# Patient Record
Sex: Female | Born: 1965 | Race: White | Hispanic: No | Smoking: Current every day smoker
Health system: Southern US, Community
[De-identification: ages and names within clinical notes are randomized; demographics above are authoritative.]

## PROBLEM LIST (undated history)

## (undated) DIAGNOSIS — Z1379 Encounter for other screening for genetic and chromosomal anomalies: Secondary | ICD-10-CM

## (undated) DIAGNOSIS — I10 Essential (primary) hypertension: Secondary | ICD-10-CM

## (undated) DIAGNOSIS — F32A Depression, unspecified: Secondary | ICD-10-CM

## (undated) DIAGNOSIS — I639 Cerebral infarction, unspecified: Secondary | ICD-10-CM

## (undated) DIAGNOSIS — C50919 Malignant neoplasm of unspecified site of unspecified female breast: Secondary | ICD-10-CM

## (undated) DIAGNOSIS — F329 Major depressive disorder, single episode, unspecified: Secondary | ICD-10-CM

## (undated) DIAGNOSIS — F419 Anxiety disorder, unspecified: Secondary | ICD-10-CM

## (undated) DIAGNOSIS — K219 Gastro-esophageal reflux disease without esophagitis: Secondary | ICD-10-CM

## (undated) HISTORY — DX: Depression, unspecified: F32.A

## (undated) HISTORY — DX: Essential (primary) hypertension: I10

## (undated) HISTORY — DX: Encounter for other screening for genetic and chromosomal anomalies: Z13.79

## (undated) HISTORY — DX: Gastro-esophageal reflux disease without esophagitis: K21.9

## (undated) HISTORY — DX: Malignant neoplasm of unspecified site of unspecified female breast: C50.919

## (undated) HISTORY — DX: Anxiety disorder, unspecified: F41.9

## (undated) HISTORY — DX: Cerebral infarction, unspecified: I63.9

## (undated) HISTORY — PX: TONSILLECTOMY AND ADENOIDECTOMY: SHX28

## (undated) HISTORY — DX: Major depressive disorder, single episode, unspecified: F32.9

---

## 1998-08-13 HISTORY — PX: ABDOMINAL HYSTERECTOMY: SHX81

## 2004-07-17 ENCOUNTER — Emergency Department: Payer: Self-pay | Admitting: Emergency Medicine

## 2004-07-17 ENCOUNTER — Other Ambulatory Visit: Payer: Self-pay

## 2004-07-18 ENCOUNTER — Ambulatory Visit: Payer: Self-pay | Admitting: Emergency Medicine

## 2004-12-02 ENCOUNTER — Emergency Department: Payer: Self-pay | Admitting: Unknown Physician Specialty

## 2005-08-07 ENCOUNTER — Ambulatory Visit: Payer: Self-pay | Admitting: Family Medicine

## 2008-06-30 ENCOUNTER — Ambulatory Visit: Payer: Self-pay | Admitting: Gastroenterology

## 2008-06-30 HISTORY — PX: COLONOSCOPY: SHX174

## 2008-08-13 HISTORY — PX: LAPAROSCOPIC HYSTERECTOMY: SHX1926

## 2008-08-17 ENCOUNTER — Ambulatory Visit: Payer: Self-pay | Admitting: Obstetrics & Gynecology

## 2008-08-26 ENCOUNTER — Ambulatory Visit: Payer: Self-pay | Admitting: Obstetrics & Gynecology

## 2009-02-17 ENCOUNTER — Inpatient Hospital Stay: Payer: Self-pay | Admitting: Specialist

## 2009-02-22 ENCOUNTER — Ambulatory Visit: Payer: Self-pay | Admitting: Unknown Physician Specialty

## 2009-03-13 ENCOUNTER — Ambulatory Visit: Payer: Self-pay | Admitting: Unknown Physician Specialty

## 2009-04-13 ENCOUNTER — Ambulatory Visit: Payer: Self-pay | Admitting: Unknown Physician Specialty

## 2009-04-30 ENCOUNTER — Emergency Department: Payer: Self-pay | Admitting: Emergency Medicine

## 2009-05-13 ENCOUNTER — Ambulatory Visit: Payer: Self-pay | Admitting: Unknown Physician Specialty

## 2011-09-07 ENCOUNTER — Ambulatory Visit: Payer: Self-pay

## 2011-09-20 ENCOUNTER — Ambulatory Visit: Payer: Self-pay | Admitting: Family Medicine

## 2012-10-31 ENCOUNTER — Ambulatory Visit: Payer: Self-pay | Admitting: Family Medicine

## 2012-11-19 ENCOUNTER — Ambulatory Visit: Payer: Self-pay | Admitting: Family Medicine

## 2014-01-11 DIAGNOSIS — C50919 Malignant neoplasm of unspecified site of unspecified female breast: Secondary | ICD-10-CM

## 2014-01-11 HISTORY — DX: Malignant neoplasm of unspecified site of unspecified female breast: C50.919

## 2014-01-22 ENCOUNTER — Ambulatory Visit: Payer: Self-pay | Admitting: Family Medicine

## 2014-01-29 ENCOUNTER — Ambulatory Visit: Payer: Self-pay | Admitting: Family Medicine

## 2014-02-09 ENCOUNTER — Ambulatory Visit: Payer: Self-pay | Admitting: General Surgery

## 2014-02-17 ENCOUNTER — Encounter: Payer: Self-pay | Admitting: General Surgery

## 2014-02-17 ENCOUNTER — Ambulatory Visit (INDEPENDENT_AMBULATORY_CARE_PROVIDER_SITE_OTHER): Payer: BC Managed Care – PPO | Admitting: General Surgery

## 2014-02-17 ENCOUNTER — Telehealth: Payer: Self-pay | Admitting: *Deleted

## 2014-02-17 ENCOUNTER — Ambulatory Visit: Payer: Self-pay | Admitting: General Surgery

## 2014-02-17 VITALS — BP 148/86 | HR 88 | Resp 12 | Ht 62.0 in | Wt 146.0 lb

## 2014-02-17 DIAGNOSIS — C50912 Malignant neoplasm of unspecified site of left female breast: Secondary | ICD-10-CM

## 2014-02-17 DIAGNOSIS — C50919 Malignant neoplasm of unspecified site of unspecified female breast: Secondary | ICD-10-CM

## 2014-02-17 NOTE — Progress Notes (Signed)
Patient ID: Tina Sharp, female   DOB: [birth year], 48 y.o.   MRN: [id number]  Chief Complaint  Patient presents with  . Breast Problem    invasive carcinoma    HPI Tina Sharp is a 48 y.o. female.  who presents for a breast evaluation. She states she felt a lump in the left breast the first part of June of this year. There's been no change since initial discovery. It was incidentally identified. There is no history of trauma. She had missed her April 2015 mammogram. The most recent mammogram was done on 01-22-14. A follow up mammogram was completed with biopsy showing carcinoma on 01-29-14.  Patient does not consistently perform regular self breast checks and gets regular mammograms done.    The patient is accompanied today by a close friend, Tina Sharp was present for the interview and exam.  HPI  Past Medical History  Diagnosis Date  . GERD (gastroesophageal reflux disease)   . Cancer June 2015    Invasive lobular carcinoma    Past Surgical History  Procedure Laterality Date  . Laparoscopic hysterectomy  2010    [facility]    Family History  Problem Relation Age of Onset  . Cancer Mother 79    breast  . Cancer Maternal Grandmother     breast /? age 43  . Cancer Maternal Grandfather 82    prostate    Social History History  Substance Use Topics  . Smoking status: Former Smoker    Quit date: 08/15/2013  . Smokeless tobacco: Not on file     Comment: E cig  . Alcohol Use: Yes     Comment: social    No Known Allergies  Current Outpatient Prescriptions  Medication Sig Dispense Refill  . esomeprazole (NEXIUM) 20 MG capsule Take 20 mg by mouth daily at 12 noon.      Marland Kitchen LORazepam (ATIVAN) 1 MG tablet Take 1 mg by mouth every 8 (eight) hours as needed for anxiety.       No current facility-administered medications for this visit.    Review of Systems Review of Systems  Constitutional: Negative.   Respiratory: Negative.   Cardiovascular: Negative.      Blood pressure 148/86, pulse 88, resp. rate 12, height '5\' 2"'  (1.575 m), weight 146 lb (66.225 kg).  Physical Exam Physical Exam  Constitutional: She is oriented to person, place, and time. She appears well-developed and well-nourished.  Neck: Neck supple.  Cardiovascular: Normal rate, regular rhythm and normal heart sounds.   Pulmonary/Chest: Effort normal and breath sounds normal.  Left breast small bruising from biopsy. A 1.5 cm nodule at 3 o'clock lateral aspect left breast  Lymphadenopathy:    She has no cervical adenopathy.  Neurological: She is alert and oriented to person, place, and time.  Skin: Skin is warm and dry.    Data Reviewed Diagnostic mammogram dated 01/22/2014 were unremarkable on the right. The left breast showed 2 cm area of architectural distortion correlating with the palpable mass 14 cm from the nipple. Ultrasound showed a irregular area measuring up to 1.8 cm in diameter. Normal axillary nodes.  Ultrasound guided core biopsy completed 01/29/2014 by the radiology service showed evidence of invasive lobular carcinoma with angiolymphatic invasion measuring up to 1.1 cm. Receptor status is pending.  PCP notes of 01/15/2014 were reviewed.    Assessment    Invasive lobular carcinoma of the left breast.    Plan   We spent about an hour reviewing options  for management. Breast conservation and mastectomy were presented as equivalent procedures. The pros and cons of each were discussed. Travel would be an issue for the patient regarding radiation as she lives about 45 minutes from either [facility] or [place].  The possibility of mastectomy with immediate reconstruction was discussed.  The potential for multifocal or contralateral tumor with the invasive lobular histology was reviewed, and indication for preoperative breast MRI discussed. She was advised is not uncommon to have additional non-ligament lesions identified the require biopsy.    This patient is  to have the following labs drawn at [place] today: CBC, Met C, CEA, and CA 27-29.   Arrangements will also be made for patient to have a chest x-ray PA and Lateral at [place] today as well.   Patient to have a bilateral breast MRI. This is to be arranged at [facility] due to the patient's location.   A referral will be made for the patient to see Dr. Nicholaus Bloom at [facility].   PCP: Dr Heidi Dach, [place] Gleason 02/20/2014, 9:10 AM

## 2014-02-17 NOTE — Telephone Encounter (Signed)
Patient has been scheduled for a bilateral breast MRI at [facility] for Wednesday, 02-24-14 at 4 pm. The patient was notified of date, time, and instructions per [facility] radiology department.   Please note: the patient's [facility] MRN is [id number].  This patient was instructed that she would need to sign a release the day she has MRI completed to obtain a copy of the MRI disc . Patient will bring disc by our office. She was also provided with the fax number so [facility] can fax a copy of the report for our review.

## 2014-02-17 NOTE — Patient Instructions (Addendum)
Continue self breast exams. Call office for any new breast issues or concerns.  This patient will need to have lab work and chest x-ray today.   Patient to have a bilateral breast MRI at [facility].  Also, a referral with be made to plastic surgery, Dr. Nicholaus Bloom.

## 2014-02-17 NOTE — Progress Notes (Deleted)
Patient ID: Tina Sharp, female   DOB: [birth year], 48 y.o.   MRN: [id number]  Chief Complaint  Patient presents with  . Breast Problem    invasive carcinoma    HPI Tina Sharp is a 48 y.o. female HPI  No past medical history on file.  No past surgical history on file.  No family history on file.  Social History History  Substance Use Topics  . Smoking status: Not on file  . Smokeless tobacco: Not on file  . Alcohol Use: Not on file    Allergies not on file  No current outpatient prescriptions on file.   No current facility-administered medications for this visit.    Review of Systems Review of Systems  Constitutional: Negative.   Respiratory: Negative.   Cardiovascular: Negative.     There were no vitals taken for this visit.  Physical Exam Physical Exam  Data Reviewed ***  Assessment    ***    Plan    ***       Lesly Rubenstein 02/17/2014, 8:42 AM

## 2014-02-18 LAB — CBC WITH DIFFERENTIAL
BASOS: 1 %
Basophils Absolute: 0.1 10*3/uL (ref 0.0–0.2)
EOS ABS: 0.2 10*3/uL (ref 0.0–0.4)
Eos: 4 %
HEMATOCRIT: 42.2 % (ref 34.0–46.6)
Hemoglobin: 14.4 g/dL (ref 11.1–15.9)
IMMATURE GRANS (ABS): 0 10*3/uL (ref 0.0–0.1)
Immature Granulocytes: 0 %
LYMPHS ABS: 1.7 10*3/uL (ref 0.7–3.1)
Lymphs: 36 %
MCH: 34.1 pg — AB (ref 26.6–33.0)
MCHC: 34.1 g/dL (ref 31.5–35.7)
MCV: 100 fL — ABNORMAL HIGH (ref 79–97)
Monocytes Absolute: 0.5 10*3/uL (ref 0.1–0.9)
Monocytes: 10 %
Neutrophils Absolute: 2.3 10*3/uL (ref 1.4–7.0)
Neutrophils Relative %: 49 %
Platelets: 310 10*3/uL (ref 150–379)
RBC: 4.22 x10E6/uL (ref 3.77–5.28)
RDW: 14.3 % (ref 12.3–15.4)
WBC: 4.7 10*3/uL (ref 3.4–10.8)

## 2014-02-18 LAB — COMPREHENSIVE METABOLIC PANEL
ALT: 19 IU/L (ref 0–32)
AST: 29 IU/L (ref 0–40)
Albumin/Globulin Ratio: 2.2 (ref 1.1–2.5)
Albumin: 4.3 g/dL (ref 3.5–5.5)
Alkaline Phosphatase: 67 IU/L (ref 39–117)
BILIRUBIN TOTAL: 0.6 mg/dL (ref 0.0–1.2)
BUN/Creatinine Ratio: 8 — ABNORMAL LOW (ref 9–23)
BUN: 6 mg/dL (ref 6–24)
CALCIUM: 9.1 mg/dL (ref 8.7–10.2)
CO2: 24 mmol/L (ref 18–29)
CREATININE: 0.8 mg/dL (ref 0.57–1.00)
Chloride: 101 mmol/L (ref 97–108)
GFR, EST AFRICAN AMERICAN: 99 mL/min/{1.73_m2} (ref >59)
GFR, EST NON AFRICAN AMERICAN: 86 mL/min/{1.73_m2} (ref >59)
GLOBULIN, TOTAL: 2 g/dL (ref 1.5–4.5)
Glucose: 86 mg/dL (ref 65–99)
Potassium: 4.5 mmol/L (ref 3.5–5.2)
SODIUM: 144 mmol/L (ref 134–144)
Total Protein: 6.3 g/dL (ref 6.0–8.5)

## 2014-02-18 LAB — CEA: CEA: 6.7 ng/mL — ABNORMAL HIGH (ref 0.0–4.7)

## 2014-02-18 LAB — CANCER ANTIGEN 27.29: CA 27.29: 32.8 U/mL (ref 0.0–38.6)

## 2014-02-20 ENCOUNTER — Encounter: Payer: Self-pay | Admitting: General Surgery

## 2014-02-20 DIAGNOSIS — C50919 Malignant neoplasm of unspecified site of unspecified female breast: Secondary | ICD-10-CM | POA: Insufficient documentation

## 2014-02-22 ENCOUNTER — Telehealth: Payer: Self-pay | Admitting: *Deleted

## 2014-02-22 NOTE — Telephone Encounter (Signed)
Patient aware of date, time, and instructions of PET scan on 03-02-14.

## 2014-02-22 NOTE — Telephone Encounter (Signed)
Message copied by Dominga Ferry on Mon Feb 22, 2014  4:07 PM ------      Message from: [place], Maceo W      Created: Mon Feb 22, 2014 10:15 AM       See if we can get a PET/CT on this patient, staging working up for breast cancer with elevated tumor markers. Thanks.            ----- Message -----         From: [facility] Lab Results In Interface         Sent: 02/18/2014   5:42 AM           To: Robert Bellow, MD                   ------

## 2014-02-22 NOTE — Telephone Encounter (Signed)
Message for patient to call the office.  Patient has been scheduled for a PET scan at [facility] for Tuesday, 03-02-14 at 8 am (arrive 7:45 am). Will need to review prep instructions with patient.

## 2014-02-22 NOTE — Telephone Encounter (Signed)
Message left for patient to call the office.  We need to see when will be a convenient time for her to have PET scan.

## 2014-02-26 ENCOUNTER — Other Ambulatory Visit: Payer: Self-pay | Admitting: *Deleted

## 2014-02-26 ENCOUNTER — Telehealth: Payer: Self-pay | Admitting: *Deleted

## 2014-02-26 ENCOUNTER — Encounter: Payer: Self-pay | Admitting: General Surgery

## 2014-02-26 DIAGNOSIS — C50912 Malignant neoplasm of unspecified site of left female breast: Secondary | ICD-10-CM

## 2014-02-26 NOTE — Telephone Encounter (Signed)
Patient was contacted to see if she has schedule an appointment with Dr. Tula Nakayama yet per Dr. Bary Castilla. Patient was given Dr. Edwin Dada office # to contact them since no appointment as not be scheduled at this time. She was also notificed of office appointment here for March 03, 2014 @ 2 pm to review results of MRI and PET scan.

## 2014-03-02 ENCOUNTER — Ambulatory Visit: Payer: Self-pay | Admitting: General Surgery

## 2014-03-02 ENCOUNTER — Encounter: Payer: Self-pay | Admitting: General Surgery

## 2014-03-03 ENCOUNTER — Ambulatory Visit (INDEPENDENT_AMBULATORY_CARE_PROVIDER_SITE_OTHER): Payer: BC Managed Care – PPO | Admitting: General Surgery

## 2014-03-03 ENCOUNTER — Other Ambulatory Visit: Payer: BC Managed Care – PPO

## 2014-03-03 ENCOUNTER — Encounter: Payer: Self-pay | Admitting: General Surgery

## 2014-03-03 VITALS — BP 140/84 | HR 74 | Resp 12 | Ht 62.0 in | Wt 147.0 lb

## 2014-03-03 DIAGNOSIS — C50912 Malignant neoplasm of unspecified site of left female breast: Secondary | ICD-10-CM

## 2014-03-03 DIAGNOSIS — C50919 Malignant neoplasm of unspecified site of unspecified female breast: Secondary | ICD-10-CM

## 2014-03-03 NOTE — Progress Notes (Addendum)
Patient ID: Tina Sharp, female   DOB: Jan 15, 1966, 48 y.o.   MRN: [id number]  Chief Complaint  Patient presents with  . Follow-up    results Ct    HPI Tina Sharp is a 48 y.o. female here today following up from her Ct done at [facility] 03/02/14 and MRI at [facility] .  HPI  Past Medical History  Diagnosis Date  . GERD (gastroesophageal reflux disease)   . Cancer June 2015    Invasive lobular carcinoma    Past Surgical History  Procedure Laterality Date  . Laparoscopic hysterectomy  2010    [facility]    Family History  Problem Relation Age of Onset  . Cancer Mother 61    breast  . Cancer Maternal Grandmother     breast /? age 15  . Cancer Maternal Grandfather 63    prostate    Social History History  Substance Use Topics  . Smoking status: Former Smoker    Quit date: 08/15/2013  . Smokeless tobacco: Not on file     Comment: E cig  . Alcohol Use: Yes     Comment: social    No Known Allergies  Current Outpatient Prescriptions  Medication Sig Dispense Refill  . esomeprazole (NEXIUM) 20 MG capsule Take 20 mg by mouth daily at 12 noon.      Marland Kitchen LORazepam (ATIVAN) 1 MG tablet Take 1 mg by mouth every 8 (eight) hours as needed for anxiety.       No current facility-administered medications for this visit.    Review of Systems Review of Systems  Constitutional: Negative.   Respiratory: Negative.   Cardiovascular: Negative.     Blood pressure 140/84, pulse 74, resp. rate 12, height 5\' 2"  (1.575 m), weight 147 lb (66.679 kg).  Physical Exam Physical Exam  Cardiovascular: Normal rate and regular rhythm.   Pulmonary/Chest: Effort normal and breath sounds normal.    Data Reviewed CA 27-29 was normal.at 32.  CEA elevated at 6.8.(upper limit of normal for smokers 5.5., she discontinued smoking in January 2015).  CEA elevated at 6.8.(upper limit of normal for smokers 5.5., she discontinued smoking in January 2015).  Colonoscopy completed 06/30/2008 for chronic  diarrhea was normal. Tina Sharp, M.D.)  CBC was notable for minimal elevation of the MCV of 100 (upper limit normal 97) hemoglobin white count normal. Comprehensive metabolic panel [id number].  PET/CT dated 03/02/2014 showed minimal increased metabolic activity in the area of the known cancer in left breast. Suggestion of a left axillary hypermetabolic node reported. No internal mammary or infraclavicular lymph nodes noted. Right axillary lymph node suggested to be slightly hypermetabolic felt to be benign.  CT of the abdomen and pelvis suggests a 10 x 19 mm nodular density anterior to the left psoas muscle the level of the common iliac vessels. No hypermetabolic activity. No pelvic lymph nodes identified.  MRI of the breast completed at [facility] dated 02/25/2014 showed the area of the left breast at the 3:00 position to have an N. her area of faint hypermetabolic activity as well as a contralateral 9:00 position focal area of hypermetabolic activity.  The right breast suggested a 2 cm linear area of increased enhancement in the retroareolar area at the 9:00 position. BI-RAD-6.  Ultrasound examination of the right retroareolar area was completed to correlate with the previously reviewed MRI. Mild ductal dilatation this area up to 0.5 cm in diameter is appreciated. Multiple ducts are appreciated in the retroareolar area from the nipple  to the 3 cm mark. Smaller ducts are noted at the 3:00 position measuring up to 0.12 cm and at the 6:00 position where a similar diameters appreciated. BI-RAD-2.  Assessment    invasive lobular carcinoma of the left breast. Faint uptake in the left axillary area likely related to the recent biopsy.  Possible right breast retroareolar area of increased enhancement, indeterminate on ultrasound.  Elevated CEA.   10 x 19 mm nodule anterior to the psoas muscle of questionable clinical concern      Plan    We spent about 1 hour reviewing options for  management. The patient is leaning strongly to left mastectomy with immediate reconstruction. She is concerned about the ptosis of the right breast and has had some interest in a bilateral procedure. Based on recent literature this would be ill-advised unless cancer is identified in the right breast.  As there is no clearcut abnormality on ultrasound her previously completed mammograms of the right breast will arrange for an MRI guided biopsy of the right retroareolar area to confirm that this is indeed benign ductal tissue accounting for the faint increased uptake on MRI imaging.  Arrangements are in place for plastic surgery consultation. Dr. Edwin Dada office will be contacting the patient in the next 24 hours to schedule.  Tentative surgery date for 04/08/2014 is planned for at a minimal left mastectomy with immediate reconstruction sentinel node biopsy and right contralateral mastopexy, possible bilateral mastectomy with immediate reconstruction.   CEA will be rechecked after her left mastectomy. If it is normal, a colonoscopy in 2019 would be appropriate. If he remains elevated upper and lower endoscopy will be planned to assess for a possible GI source.     PCP: Gaynell Face 03/03/2014, 3:21 PM

## 2014-03-04 ENCOUNTER — Telehealth: Payer: Self-pay | Admitting: *Deleted

## 2014-03-04 NOTE — Telephone Encounter (Signed)
Patient notified of surgery instructions. She verbalizes understanding.

## 2014-03-04 NOTE — Telephone Encounter (Signed)
[facility] has been contacted and information has been forwarded and received by the mammography department. We are waiting on a call back from [place] at [facility] in regards to a date for MRI guided biopsy of the right breast.

## 2014-03-04 NOTE — Telephone Encounter (Signed)
Message copied by Dominga Ferry on Thu Mar 04, 2014 10:48 AM ------      Message from: [place], Forest Gleason      Created: Thu Mar 04, 2014  8:52 AM       Right breast MRI biopsy at [facility] for abnormality noted on recent MRI exam. Negative office ultrasound, negative mammogram.  ------

## 2014-03-04 NOTE — Telephone Encounter (Signed)
Message for patient to call the office.  Patient's surgery has been scheduled for 04-08-14 at [facility]. We need to review day of surgery instructions.

## 2014-03-08 ENCOUNTER — Encounter: Payer: Self-pay | Admitting: *Deleted

## 2014-03-08 DIAGNOSIS — C50919 Malignant neoplasm of unspecified site of unspecified female breast: Secondary | ICD-10-CM

## 2014-03-08 DIAGNOSIS — R6889 Other general symptoms and signs: Secondary | ICD-10-CM

## 2014-03-08 NOTE — Telephone Encounter (Signed)
Message was left for Tina Sharp at [facility] since we have not heard back in regards to a date for MR guided biopsy.

## 2014-03-08 NOTE — Progress Notes (Signed)
Patient ID: Tina Sharp, female   DOB: 01-26-1966, 48 y.o.   MRN: [id number]  Per Adonis Brook at [place] (Phone: [phone]), patient needs to have a repeat bilateral diagnostic mammogram prior to MRI guided biopsy of the right breast per [facility] radiologist. This is due to the fact that previous images from [facility] look as if they have been marked incorrectly.  [facility]Fax: [phone][facility] for a bilateral diagnostic mammogram.   Message for patient to call the office in regards to this. Adonis Brook will contact patient tomorrow morning to arrange.

## 2014-03-11 ENCOUNTER — Telehealth: Payer: Self-pay | Admitting: *Deleted

## 2014-03-11 NOTE — Telephone Encounter (Signed)
Per Adonis Brook at [facility], patient does not need MR guided right breast biopsy per recent mammogram and ultrasound completed on 03-10-14.

## 2014-03-12 ENCOUNTER — Telehealth: Payer: Self-pay | Admitting: General Surgery

## 2014-03-12 NOTE — Telephone Encounter (Signed)
I reviewed the 03/10/2014 right breast mammogram and ultrasound completed at [facility]. No additional lesions were identified and MRI biopsy was not felt necessary.  Reviewed the findings with the patient.  She is set to meet with Nicholaus Bloom, M.D. on August 19, plan for left mastectomy with immediate reconstruction 04/08/2014. She is encouraged to call she has any questions.

## 2014-03-16 ENCOUNTER — Other Ambulatory Visit: Payer: Self-pay | Admitting: General Surgery

## 2014-03-16 DIAGNOSIS — C50912 Malignant neoplasm of unspecified site of left female breast: Secondary | ICD-10-CM

## 2014-04-08 ENCOUNTER — Encounter: Payer: Self-pay | Admitting: General Surgery

## 2014-04-08 ENCOUNTER — Ambulatory Visit: Payer: Self-pay | Admitting: General Surgery

## 2014-04-08 DIAGNOSIS — C50419 Malignant neoplasm of upper-outer quadrant of unspecified female breast: Secondary | ICD-10-CM

## 2014-04-08 HISTORY — PX: BREAST SURGERY: SHX581

## 2014-04-08 HISTORY — PX: OTHER SURGICAL HISTORY: SHX169

## 2014-04-08 LAB — URINALYSIS, COMPLETE
Blood: NEGATIVE
Glucose,UR: NEGATIVE mg/dL (ref 0–75)
Hyaline Cast: 5
KETONE: NEGATIVE
Leukocyte Esterase: NEGATIVE
Nitrite: NEGATIVE
PH: 5 (ref 4.5–8.0)
Protein: 30
SPECIFIC GRAVITY: 1.02 (ref 1.003–1.030)
Squamous Epithelial: 6

## 2014-04-09 ENCOUNTER — Encounter: Payer: Self-pay | Admitting: General Surgery

## 2014-04-12 LAB — PATHOLOGY REPORT

## 2014-04-13 ENCOUNTER — Telehealth: Payer: Self-pay | Admitting: General Surgery

## 2014-04-13 NOTE — Telephone Encounter (Signed)
Notified of path report: Nodes negative. Tumor on ink in one local (microscopic). Tumor larger than pre-procedure imaging. 2.8 vs 1.8 cm.    No additional surgery needed.  Likely will benefit from Oncotype DX testing.  Med onc assessment will be of benefit.

## 2014-04-13 NOTE — Telephone Encounter (Signed)
PATIENT CALLED & WANTED TO GET HER RESULTS FROM SURGERY ON 04-08-14.SHE THOUGHT YOU WHERE GOING TO CALL HE WITH THE RESULTS ON Monday OR Tuesday.SHE HAS AN APPT TOMORROW WITH DR Tula Nakayama.

## 2014-04-21 ENCOUNTER — Encounter: Payer: Self-pay | Admitting: General Surgery

## 2014-04-21 ENCOUNTER — Ambulatory Visit: Payer: BC Managed Care – PPO | Admitting: General Surgery

## 2014-04-21 ENCOUNTER — Ambulatory Visit (INDEPENDENT_AMBULATORY_CARE_PROVIDER_SITE_OTHER): Payer: Self-pay | Admitting: General Surgery

## 2014-04-21 VITALS — BP 110/78 | HR 72 | Resp 12 | Ht 62.0 in | Wt 147.0 lb

## 2014-04-21 DIAGNOSIS — C50919 Malignant neoplasm of unspecified site of unspecified female breast: Secondary | ICD-10-CM

## 2014-04-21 NOTE — Progress Notes (Addendum)
Patient ID: Tina Sharp, female   DOB: 10-May-1966, 48 y.o.   MRN: [id number]  Chief Complaint  Patient presents with  . Routine Post Op    left mastectomy    HPI Tina Sharp is a 48 y.o. female here today for her post op left mastectomy done on 04/08/14. Patient states she is doing well. Last drain removed earlier this week by Dr.Coan.   HPI  Past Medical History  Diagnosis Date  . GERD (gastroesophageal reflux disease)   . Cancer June 2015    Invasive lobular carcinoma, 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 ER/ PR+; Her 2 neu not overexpressed, microscopic positive margin (skin).    Past Surgical History  Procedure Laterality Date  . Laparoscopic hysterectomy  2010    [facility]  . Breast surgery Left 04/08/14    mastectomy    Family History  Problem Relation Age of Onset  . Cancer Mother 65    breast  . Cancer Maternal Grandmother     breast /? age 30  . Cancer Maternal Grandfather 42    prostate    Social History History  Substance Use Topics  . Smoking status: Former Smoker    Quit date: 08/15/2013  . Smokeless tobacco: Not on file     Comment: E cig  . Alcohol Use: Yes     Comment: social    No Known Allergies  Current Outpatient Prescriptions  Medication Sig Dispense Refill  . cephALEXin (KEFLEX) 500 MG capsule Take 500 mg by mouth 4 (four) times daily.       Marland Kitchen esomeprazole (NEXIUM) 20 MG capsule Take 20 mg by mouth daily at 12 noon.      Marland Kitchen HYDROcodone-acetaminophen (NORCO/VICODIN) 5-325 MG per tablet Take 1 tablet by mouth every 6 (six) hours as needed.       Marland Kitchen LORazepam (ATIVAN) 1 MG tablet Take 1 mg by mouth every 8 (eight) hours as needed for anxiety.      . promethazine (PHENERGAN) 25 MG tablet Take 25 mg by mouth every 4 (four) hours as needed.        No current facility-administered medications for this visit.    Review of Systems Review of Systems  Constitutional: Negative.   Respiratory: Negative.   Cardiovascular: Negative.      Blood pressure 110/78, pulse 72, resp. rate 12, height '5\' 2"'  (1.575 m), weight 147 lb (66.679 kg).  Physical Exam Physical Exam  Constitutional: She is oriented to person, place, and time. She appears well-developed and well-nourished.  Eyes: Conjunctivae are normal. No scleral icterus.  Neck: Neck supple.  Neurological: She is alert and oriented to person, place, and time.  Skin: Skin is warm and dry.    Data Reviewed Pathology showed a microscopic positive skin margin, site unknown. Tumor size 2.9 cm. There is an ultrasound estimate, significantly smaller than MRI.  Assessment    Doing well status post mastectomy with immediate reconstruction, contralateral mastopexy.    Plan    Will arrange for Oncotype DX testing it will be prudent to obtain medical oncology opinion after the gene analysis results are available. As she is just 48, and has a first degree relative with breast cancer, BRCA2 testing has been recommended and accepted.  No indication for additional surgery or chest wall radiation.    PCP: Tina Sharp 04/23/2014, 1:40 PM

## 2014-04-22 ENCOUNTER — Encounter: Payer: Self-pay | Admitting: General Surgery

## 2014-04-23 ENCOUNTER — Encounter: Payer: Self-pay | Admitting: General Surgery

## 2014-04-23 DIAGNOSIS — Z170 Estrogen receptor positive status [ER+]: Secondary | ICD-10-CM

## 2014-04-23 DIAGNOSIS — C50012 Malignant neoplasm of nipple and areola, left female breast: Secondary | ICD-10-CM | POA: Insufficient documentation

## 2014-04-27 ENCOUNTER — Ambulatory Visit: Payer: BC Managed Care – PPO

## 2014-04-29 ENCOUNTER — Telehealth: Payer: Self-pay | Admitting: *Deleted

## 2014-04-29 NOTE — Telephone Encounter (Signed)
[name] with [employer] network management called to let us know they had received the faxed letter from Dr. Bary Castilla with regards to genetic testing and [employer]. They are in the process of reviewing the material and specifically Tina Sharp.

## 2014-04-29 NOTE — Telephone Encounter (Signed)
She called to cancel her genetic testing, it would be out of network benefits. I talked with her and she is aware that $375 is max for [employer]. She is also aware that other companies can preform the genetic testing but are not specialized in genetic testing with comparable quality. She is going to check to see what her coverage would be in network vs out of network and get back with [place].

## 2014-04-30 ENCOUNTER — Telehealth: Payer: Self-pay | Admitting: *Deleted

## 2014-04-30 DIAGNOSIS — C50919 Malignant neoplasm of unspecified site of unspecified female breast: Secondary | ICD-10-CM

## 2014-04-30 NOTE — Telephone Encounter (Signed)
Pt called and is aware you are out of the office till Tuesday but she was just calling you regarding Genetic Testing.

## 2014-05-03 ENCOUNTER — Telehealth: Payer: Self-pay | Admitting: General Surgery

## 2014-05-03 NOTE — Telephone Encounter (Signed)
Notified Oncotype DX results show low risk for recurrence.  I would like her to see Med onc for their opinion.  She prefers afternoons, Sammuel Cooper, Thursday

## 2014-05-03 NOTE — Telephone Encounter (Signed)
Message for patient to call the office.   An appointment has been scheduled with Dr. Ma Hillock at the [facility] for next Tuesday, 05-11-14 at 2 pm. Records have been forwarded for physician review.

## 2014-05-03 NOTE — Telephone Encounter (Signed)
Patient called back and has been notified of date, time, and instructions.

## 2014-05-04 NOTE — Telephone Encounter (Signed)
She states that [facility] (test # [id number]) is an in network provider and wishes to have this testing done there. Form completed. Patient will come by tomorrow .

## 2014-05-05 ENCOUNTER — Other Ambulatory Visit: Payer: Self-pay | Admitting: General Surgery

## 2014-05-05 DIAGNOSIS — Z1379 Encounter for other screening for genetic and chromosomal anomalies: Secondary | ICD-10-CM

## 2014-05-05 HISTORY — DX: Encounter for other screening for genetic and chromosomal anomalies: Z13.79

## 2014-05-12 ENCOUNTER — Telehealth: Payer: Self-pay

## 2014-05-12 NOTE — Telephone Encounter (Signed)
Received a call from [employer] [place] regarding BRCA testing authorization. She says that the patient is approved for certified out of network benefits. Reference number [id number], good 04/22/14 thru 05/22/15

## 2014-05-18 ENCOUNTER — Telehealth: Payer: Self-pay | Admitting: *Deleted

## 2014-05-18 NOTE — Telephone Encounter (Signed)
Message copied by Carson Myrtle on Tue May 18, 2014  1:31 PM ------      Message from: Augustin Schooling      Created: Tue May 18, 2014  1:26 PM      Contact: [phone]       Reggie from [facility] wants you to call him when you get a chance.  ------

## 2014-05-18 NOTE — Telephone Encounter (Signed)
[facility] needed the Reference # from [employer]. If there is anything else she will call back.

## 2014-05-19 LAB — PATHOLOGY REPORT

## 2014-05-21 ENCOUNTER — Ambulatory Visit: Payer: Self-pay | Admitting: Internal Medicine

## 2014-05-21 LAB — CBC CANCER CENTER
Basophil #: 0.1 x10 3/mm (ref 0.0–0.1)
Basophil %: 0.6 %
EOS PCT: 3.4 %
Eosinophil #: 0.3 x10 3/mm (ref 0.0–0.7)
HCT: 38.7 % (ref 35.0–47.0)
HGB: 12.4 g/dL (ref 12.0–16.0)
Lymphocyte #: 1.5 x10 3/mm (ref 1.0–3.6)
Lymphocyte %: 17.5 %
MCH: 33.3 pg (ref 26.0–34.0)
MCHC: 31.9 g/dL — ABNORMAL LOW (ref 32.0–36.0)
MCV: 104 fL — ABNORMAL HIGH (ref 80–100)
MONO ABS: 0.7 x10 3/mm (ref 0.2–0.9)
Monocyte %: 7.9 %
NEUTROS ABS: 6.3 x10 3/mm (ref 1.4–6.5)
Neutrophil %: 70.6 %
PLATELETS: 292 x10 3/mm (ref 150–440)
RBC: 3.71 10*6/uL — ABNORMAL LOW (ref 3.80–5.20)
RDW: 14.3 % (ref 11.5–14.5)
WBC: 8.9 x10 3/mm (ref 3.6–11.0)

## 2014-05-21 LAB — CREATININE, SERUM
Creatinine: 0.86 mg/dL (ref 0.60–1.30)
EGFR (Non-African Amer.): >60

## 2014-05-21 LAB — HEPATIC FUNCTION PANEL A (ARMC)
ALBUMIN: 3.8 g/dL (ref 3.4–5.0)
ALK PHOS: 109 U/L
BILIRUBIN TOTAL: 0.3 mg/dL (ref 0.2–1.0)
Bilirubin, Direct: <0.1 mg/dL (ref 0.00–0.20)
SGOT(AST): 125 U/L — ABNORMAL HIGH (ref 15–37)
SGPT (ALT): 76 U/L — ABNORMAL HIGH
Total Protein: 6.6 g/dL (ref 6.4–8.2)

## 2014-05-26 ENCOUNTER — Ambulatory Visit (INDEPENDENT_AMBULATORY_CARE_PROVIDER_SITE_OTHER): Payer: Self-pay | Admitting: General Surgery

## 2014-05-26 ENCOUNTER — Encounter: Payer: Self-pay | Admitting: General Surgery

## 2014-05-26 VITALS — BP 116/70 | HR 88 | Resp 14 | Ht 62.0 in | Wt 145.0 lb

## 2014-05-26 DIAGNOSIS — C50912 Malignant neoplasm of unspecified site of left female breast: Secondary | ICD-10-CM

## 2014-05-26 NOTE — Progress Notes (Signed)
Patient ID: Tina Sharp, female   DOB: 02-Apr-1966, 48 y.o.   MRN: [id number]  Chief Complaint  Patient presents with  . Follow-up    left mastectomuy    HPI Tina Sharp is a 48 y.o. female here today following up from an left mastectomy done on 04/08/14. Patient states she is doing well. Scheduled for breast filling on 06-02-14. The patient was recently evaluated by Leia Alf, M.D. For medical oncology. By report adjuvant chemotherapy has not been recommended.  HPI  Past Medical History  Diagnosis Date  . GERD (gastroesophageal reflux disease)   . Cancer June 2015    Invasive lobular carcinoma, 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 ER/ PR+; Her 2 neu not overexpressed, microscopic positive margin (skin).    Past Surgical History  Procedure Laterality Date  . Laparoscopic hysterectomy  2010    [facility]  . Breast surgery Left 04/08/14    mastectomy    Family History  Problem Relation Age of Onset  . Cancer Mother 61    breast  . Cancer Maternal Grandmother     breast /? age 70  . Cancer Maternal Grandfather 28    prostate    Social History History  Substance Use Topics  . Smoking status: Former Smoker    Quit date: 08/15/2013  . Smokeless tobacco: Not on file     Comment: E cig  . Alcohol Use: Yes     Comment: social    No Known Allergies  Current Outpatient Prescriptions  Medication Sig Dispense Refill  . cyclobenzaprine (FLEXERIL) 5 MG tablet Take 5 mg by mouth 3 (three) times daily as needed.       Marland Kitchen esomeprazole (NEXIUM) 20 MG capsule Take 20 mg by mouth daily at 12 noon.      Marland Kitchen LORazepam (ATIVAN) 1 MG tablet Take 1 mg by mouth every 8 (eight) hours as needed for anxiety.      . promethazine (PHENERGAN) 25 MG tablet Take 25 mg by mouth every 4 (four) hours as needed.        No current facility-administered medications for this visit.    Review of Systems Review of Systems  Constitutional: Negative.   Respiratory: Negative.   Cardiovascular:  Negative.     Blood pressure 116/70, pulse 88, resp. rate 14, height 5\' 2"  (1.575 m), weight 145 lb (65.772 kg).  Physical Exam Physical Exam  Constitutional: She is oriented to person, place, and time. She appears well-developed and well-nourished.  Neck: Neck supple.  Pulmonary/Chest:  Reconstructive breast healing well. Right breast a soft and supple without mass or thickening.  Lymphadenopathy:    She has no cervical adenopathy.  Neurological: She is alert and oriented to person, place, and time.  Skin: Skin is warm and dry.    Data Reviewed Recent laboratory studies dated May 21, 2014 shows the patient's [facility] are both consistent with postmenopausal status. Liver function studies of the same date showed modest elevation of the SGPT and SGOT, significantly up from values obtained in July of this year. Remaining liver function studies are unremarkable. CBC shows a modest elevation of the MCV of 104. Blood cell count 8900, hemoglobin 12.4.  Assessment    Doing well status post left mastectomy with immediate reconstruction.     Plan    We'll plan for a followup right breast mammogram in June of 2061 office visit follow.      PCP/Ref: Chriss Czar Dr. Tula Nakayama Dr. Charletta Cousin, Dellis Filbert  W 05/27/2014, 6:27 AM

## 2014-05-26 NOTE — Patient Instructions (Signed)
Continue self breast exams. Call office for any new breast issues or concerns. 

## 2014-05-27 ENCOUNTER — Encounter: Payer: Self-pay | Admitting: General Surgery

## 2014-05-31 ENCOUNTER — Encounter: Payer: Self-pay | Admitting: General Surgery

## 2014-06-01 ENCOUNTER — Encounter: Payer: Self-pay | Admitting: General Surgery

## 2014-06-07 ENCOUNTER — Telehealth: Payer: Self-pay | Admitting: *Deleted

## 2014-06-07 NOTE — Telephone Encounter (Signed)
Bill with [employer] called the answering service over the weekend regarding this patient. He told the answering service he could not reach the patient by phone and that her BRCA test would be cancelled after 10 days.   Michael with [employer] was contacted this morning [phone]) and states that they just need to find out if patient has received the billing waivers and if so she needs to send them back. Also, they only had patient's cell number on file and work number was provided to them.  Patient was contacted today on her cell phone and notified accordingly. This patient states that she had received some forms and sent them back already. Patient was encouraged to follow up with [place] number was provided to her.

## 2014-06-13 ENCOUNTER — Ambulatory Visit: Payer: Self-pay | Admitting: Internal Medicine

## 2014-06-14 ENCOUNTER — Encounter: Payer: Self-pay | Admitting: General Surgery

## 2014-06-14 LAB — BRCASSURE COMPREHENSIVE TEST

## 2014-06-14 LAB — COMPREHENSIVE BRCA1/2 ANALYSIS

## 2014-07-07 ENCOUNTER — Ambulatory Visit: Payer: Self-pay

## 2014-07-16 ENCOUNTER — Telehealth: Payer: Self-pay | Admitting: *Deleted

## 2014-07-16 NOTE — Telephone Encounter (Signed)
Pt is calling wanting to know if her genetic testing results came back in, she stated they were sent out to [place]. She is aware that you will not be here on Monday, she wanted to talk directly to you.

## 2014-07-20 NOTE — Telephone Encounter (Signed)
Notified patient negative BRCA, patient pleased. Discussed follow-up appointments, patient agrees

## 2014-08-13 HISTORY — PX: MASTECTOMY: SHX3

## 2014-08-13 HISTORY — PX: REDUCTION MAMMAPLASTY: SUR839

## 2014-10-12 ENCOUNTER — Telehealth: Payer: Self-pay | Admitting: *Deleted

## 2014-10-12 NOTE — Telephone Encounter (Signed)
She called to say she needed a new RX for mastectomy supplies sent to [place] and faxed.

## 2014-12-04 NOTE — Op Note (Signed)
PATIENT NAME:  Tina Sharp, Tina Sharp MR#:  [id number] DATE OF BIRTH:  1966-08-09  DATE OF PROCEDURE:  04/08/2014   PREOPERATIVE DIAGNOSIS: Left breast cancer and breast asymmetry.   PROCEDURE:  1.  Right breast reduction.  2.  Left breast immediate reconstruction with tissue expander and acellular dermal matrix.   SURGEON: Cleda Daub, M.D.   ANESTHESIA: General endotracheal.   ESTIMATED BLOOD LOSS: Less than 50 mL.   COMPLICATIONS: None.   FINDINGS: Good breast shape and nipple perfusion on the right at completion of procedure, and well perfused tissue flaps on the left.   DISPOSITION: Extubated and stable to recovery.   STATEMENT OF NECESSITY: The patient is a pleasant 49 year old female with a diagnosis of left breast cancer. She has been worked up and has no evidence of extended disease. The plan is for immediate reconstruction and symmetry procedure on the contralateral side simultaneously. The risks, benefits, and alternatives have been thoroughly discussed with her, including utilizing the ASPS consent forms. She has requested the procedures as described.   STATEMENT OF PROCEDURE: The patient was brought to the operating room, awake, alert and comfortable and placed on the OR table in the supine position with both arms outstretched. Endotracheal anesthesia was introduced without apparent complication. Bony prominences were padded. SCDs were on and functioning. Preoperative markings were confirmed, and she was prepped and draped in the usual sterile fashion.   Dr. Bary Castilla at this point performed a left breast mastectomy and sentinel lymph node procedure. This will be dictated separately. Ultimately a sentinel lymph node was not identified, and he performed a left axillary dissection. Again this was performed separately and will be dictated separately.   While Dr. Bary Castilla was performing the mastectomy and sentinel node procedure on the left, I work on the right no performed a breast  reduction, short scar vertical style. This was a superior medial pedicle. The preoperative markings were utilized, and a 10 cm wide mosque pattern was de-epithelialized around a 45 mm nipple areola complex. Medial and lateral pillars were developed and, working superiorly, a pocket was created in the upper pole in order to rotate the tissue from the lower pole into the upper pole in a clockwise type direction. The tongue of tissue in the lower pole between the medial and lateral pillars was found to be excessive and was resect. The wound was copiously irrigated. The quality of the tissue was quite poor. The breast parenchyma was minimal, fatty and the vessels were highly friable.   A 15 [place] Blake drain was brought out through a stab incision laterally and secured into place. The wound was irrigated. At this point 2-0 PDS suture were used to reapproximate the medial and lateral pillars and create a projected, well-formed breast. A 3-0 PDS was then used to  reinforce the second layer of pillar lower closure more superficially. A 3-0 Vicryl was used to inset the nipple areola and make deep dermal sutures, and finally a 2-0 Quill Monoderm suture was used to complete the closure.   Needle, sponge, and lap counts were correct.   At the completion of this procedure, Dr. Bary Castilla was still working, and ultimately he performed a mastectomy and axillary dissection. After completing his portion of the procedure, the flaps were examined. Tissue quality was thin and poor. Flaps were thin, but well developed. Tissue viability appeared good. There was a burn in the axillary skin, which was excised and closed primarily. The total diameter of this was approximately 8 mm;  this was well away from the central breast.   At this point, the reconstruction was performed. The pectoralis muscle was elevated off  the ribs up to the sternum. A small amount of dissection superiorly was performed in order to allow for a subpectoral  space to be created. A medium piece of shaped AlloDerm was hydrated and then secured to the inframammary fold with interrupted 0 PDS sutures; this was a 3-way suture which tacked down the inframammary fold and created a folded in the AlloDerm in the lower pole.   Next, beginning medially, the inferior medial corner was secured using 2-0 Vicryl suture and the pocket was created all the way around, beginning laterally and sewing medially and medially sewing laterally, until a small opening was remaining between the AlloDerm and the pectoralis muscle. The wound was irrigated again copiously. A 19 French Blake drain was placed in the axilla secondary to the axillary dissection and two 15 French Blake drains were placed as usual, 1 in the subpectoral space and 1 in the subcutaneous space. Hemostasis was confirmed.   A style 133 MX-13 expander was prepared. All of the air was removed. Next 3 mL of methylene blue were inserted, and it was placed in the subpectoral space and orientation was confirmed. Next, 250 mL was placed in the expander, after completing the closure between the pectoralis and the AlloDerm. The tissue lay very smoothly over the implant. There was absolutely no tension on the overlying tissue.   The wound was inspected and there was no evidence of bleeding, and the incision was closed in a vertical fashion using interrupted 3-0 Vicryl and 4-0 Monocryl. Needle, sponge and lap counts were correct. She tolerated the procedure well. All incisions were covered with Dermabond and a sterile dressing.   She tolerated the procedure well. She was awakened smoothly from anesthesia and placed in a lightly compressive bra. She was transferred to recovery in good condition.     ____________________________ Cleda Daub, MD bsc:MT D: 04/10/2014 10:41:38 ET T: 04/10/2014 11:13:23 ET JOB#: [id number]  cc: Cleda Daub, MD, <Dictator> Cleda Daub MD ELECTRONICALLY SIGNED 05/05/2014 11:32

## 2014-12-04 NOTE — Op Note (Signed)
PATIENT NAME:  Tina Sharp, Tina Sharp MR#:  [id number] DATE OF BIRTH:  06-29-1966  DATE OF PROCEDURE:  04/08/2014  PREOPERATIVE DIAGNOSIS: Invasive lobular carcinoma of the left breast, asymmetry of the right breast.   POSTOPERATIVE DIAGNOSIS:  Invasive lobular carcinoma of the left breast, asymmetry of the right breast.    OPERATIVE PROCEDURE: Left modified radical mastectomy with immediate reconstruction, right breast mastopexy.   SURGEON: Hervey Ard, M.D., Nicholaus Bloom, M.D.   ANESTHESIA: General under Dr. Kayleen Memos.   ESTIMATED BLOOD LOSS: Less than 150 mL.   CLINICAL NOTE:   This 49 year old woman recently was diagnosed with invasive lobular carcinoma. She desired mastectomy with immediate reconstruction. Preoperative MRI of the contralateral breast showed no areas of concern. Plans were for left simple mastectomy and sentinel node biopsy and right breast mastopexy. The patient was injected with technetium sulfur colloid prior to the procedure.   OPERATIVE NOTE: The patient received vancomycin 1 gram intravenously for antibiotic prophylaxis. Pneumatic compression stockings for deep vein thrombosis prevention.   Under general anesthesia, 6 mL of a mixture of 2:1 saline to methylene blue was injected in the subareolar plexus. Scanning through the axilla showed incredibly low counts of less than 20. The flaps were outlined by the plastic surgery service and a mastectomy completed taking the fascia of the pectoralis muscle with the specimen. With the axillary envelope opened neither blue lymphatics nor hot lymph nodes by lymphoscintigraphy could be identified. No palpable nodes. Preoperative imaging had suggested increased uptake in the axilla based on MRI and as there were no plans for postoperative radiation therapy, it was felt prudent to proceed to axillary dissection. This was completed with the following borders: Axillary vein superiorly, chest wall medially, and serratus latissimus muscle  inferiorly. The long thoracic nerve of Bell and thoracodorsal nerve, artery and vein bundle were identified and protected. Both nerves were functional at the end of the procedure. All of the mastectomy was completed.  Dr. Tula Nakayama performed a right mastopexy.  This will be dictated separately. After the left mastectomy was completed, the procedure was turned over to Dr. Tula Nakayama for reconstruction which will again be dictated separately.   The patient tolerated the procedure well and was subsequently taken to the recovery room in stable condition.     ____________________________ Robert Bellow, MD jwb:nr D: 04/08/2014 21:14:05 ET T: 04/08/2014 23:21:26 ET JOB#: [id number]  cc: Robert Bellow, MD, <Dictator> Cleda Daub, MD JEFFREY Amedeo Kinsman MD ELECTRONICALLY SIGNED 04/09/2014 9:04

## 2014-12-08 ENCOUNTER — Other Ambulatory Visit: Payer: Self-pay

## 2014-12-08 DIAGNOSIS — C50912 Malignant neoplasm of unspecified site of left female breast: Secondary | ICD-10-CM

## 2015-01-19 ENCOUNTER — Ambulatory Visit (INDEPENDENT_AMBULATORY_CARE_PROVIDER_SITE_OTHER): Payer: BLUE CROSS/BLUE SHIELD | Admitting: General Surgery

## 2015-01-19 ENCOUNTER — Encounter: Payer: Self-pay | Admitting: General Surgery

## 2015-01-19 VITALS — BP 140/82 | HR 88 | Resp 14 | Ht 62.0 in | Wt 145.0 lb

## 2015-01-19 DIAGNOSIS — C50912 Malignant neoplasm of unspecified site of left female breast: Secondary | ICD-10-CM

## 2015-01-19 NOTE — Progress Notes (Signed)
Patient ID: Tina Sharp, female   DOB: 1966-03-12, 49 y.o.   MRN: [id number]  Chief Complaint  Patient presents with  . Follow-up    mammogram    HPI Tina Sharp is a 49 y.o. female.  who presents for a breast evaluation. The most recent right breast mammogram was done on 01-11-15.  Patient does perform regular self breast checks and gets regular mammograms done.  No new breast issues. Tolerating the Arimidex.  HPI  Past Medical History  Diagnosis Date  . GERD (gastroesophageal reflux disease)   . Cancer June 2015    Invasive lobular carcinoma, 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 ER/ PR+; Her 2 neu not overexpressed, microscopic positive margin (skin).    Past Surgical History  Procedure Laterality Date  . Laparoscopic hysterectomy  2010    [facility]  . Breast surgery Left 04/08/14    mastectomy  . Reduction mammoplasty Right 04/08/14    Dr Tula Nakayama  . Colonoscopy      Family History  Problem Relation Age of Onset  . Cancer Mother 57    breast  . Cancer Maternal Grandmother     breast /? age 73  . Cancer Maternal Grandfather 71    prostate    Social History History  Substance Use Topics  . Smoking status: Former Smoker    Quit date: 08/15/2013  . Smokeless tobacco: Never Used     Comment: E cig  . Alcohol Use: 0.0 oz/week    0 Standard drinks or equivalent per week     Comment: social    No Known Allergies  Current Outpatient Prescriptions  Medication Sig Dispense Refill  . anastrozole (ARIMIDEX) 1 MG tablet Take 1 mg by mouth daily.     No current facility-administered medications for this visit.    Review of Systems Review of Systems  Constitutional: Negative.   Respiratory: Negative.   Cardiovascular: Negative.     Blood pressure 140/82, pulse 88, resp. rate 14, height 5\' 2"  (1.575 m), weight 145 lb (65.772 kg).  Physical Exam Physical Exam  Constitutional: She is oriented to person, place, and time. She appears well-developed and  well-nourished.  Neck: Neck supple.  Cardiovascular: Normal rate, regular rhythm and normal heart sounds.   Pulmonary/Chest: Effort normal and breath sounds normal. Right breast exhibits no inverted nipple, no mass, no nipple discharge, no skin change and no tenderness.  Well healed mastectomy and reconstruction incision.  Lymphadenopathy:    She has no cervical adenopathy.  Neurological: She is alert and oriented to person, place, and time.  Skin: Skin is warm and dry.    Data Reviewed Right breast mammogram dated 01/18/2015 completed at [facility] I was reviewed. Postprocedural changes in the right breast post reduction mastoplasty noted. BI-RADS-3.  Assessment    Doing well status post left mastectomy with immediate reconstruction, right reduction mastopexy.    Plan    We'll obtain a follow-up mammogram is recommended by radiology in 6 months.     The patient will be asked to return to the office in six months with a right diagnostic mammogram.   PCP:  Gaynell Face 01/21/2015, 1:25 PM

## 2015-01-19 NOTE — Patient Instructions (Signed)
Continue self breast exams. Call office for any new breast issues or concerns. 

## 2015-06-11 ENCOUNTER — Inpatient Hospital Stay (HOSPITAL_COMMUNITY): Payer: BLUE CROSS/BLUE SHIELD

## 2015-06-11 ENCOUNTER — Inpatient Hospital Stay (HOSPITAL_COMMUNITY)
Admission: EM | Admit: 2015-06-11 | Discharge: 2015-06-14 | DRG: 041 | Disposition: A | Discharge status: Home / Self Care | Payer: BLUE CROSS/BLUE SHIELD | Source: Other Acute Inpatient Hospital | Attending: Neurology | Admitting: Neurology

## 2015-06-11 ENCOUNTER — Encounter: Payer: Self-pay | Admitting: Emergency Medicine

## 2015-06-11 ENCOUNTER — Emergency Department
Admission: EM | Admit: 2015-06-11 | Discharge: 2015-06-11 | Disposition: A | Discharge status: Other Acute Inpatient Hospital | Payer: BLUE CROSS/BLUE SHIELD | Attending: Student | Admitting: Student

## 2015-06-11 ENCOUNTER — Emergency Department: Payer: BLUE CROSS/BLUE SHIELD

## 2015-06-11 DIAGNOSIS — Z9012 Acquired absence of left breast and nipple: Secondary | ICD-10-CM | POA: Diagnosis not present

## 2015-06-11 DIAGNOSIS — E785 Hyperlipidemia, unspecified: Secondary | ICD-10-CM | POA: Diagnosis present

## 2015-06-11 DIAGNOSIS — I6340 Cerebral infarction due to embolism of unspecified cerebral artery: Principal | ICD-10-CM | POA: Diagnosis present

## 2015-06-11 DIAGNOSIS — C50919 Malignant neoplasm of unspecified site of unspecified female breast: Secondary | ICD-10-CM | POA: Diagnosis present

## 2015-06-11 DIAGNOSIS — G8194 Hemiplegia, unspecified affecting left nondominant side: Secondary | ICD-10-CM | POA: Diagnosis present

## 2015-06-11 DIAGNOSIS — I63031 Cerebral infarction due to thrombosis of right carotid artery: Secondary | ICD-10-CM | POA: Diagnosis not present

## 2015-06-11 DIAGNOSIS — R531 Weakness: Secondary | ICD-10-CM | POA: Diagnosis present

## 2015-06-11 DIAGNOSIS — Z87891 Personal history of nicotine dependence: Secondary | ICD-10-CM | POA: Insufficient documentation

## 2015-06-11 DIAGNOSIS — I639 Cerebral infarction, unspecified: Secondary | ICD-10-CM | POA: Insufficient documentation

## 2015-06-11 DIAGNOSIS — R29702 NIHSS score 2: Secondary | ICD-10-CM | POA: Diagnosis present

## 2015-06-11 DIAGNOSIS — R51 Headache: Secondary | ICD-10-CM

## 2015-06-11 DIAGNOSIS — R29898 Other symptoms and signs involving the musculoskeletal system: Secondary | ICD-10-CM

## 2015-06-11 DIAGNOSIS — I10 Essential (primary) hypertension: Secondary | ICD-10-CM | POA: Diagnosis present

## 2015-06-11 DIAGNOSIS — I63339 Cerebral infarction due to thrombosis of unspecified posterior cerebral artery: Secondary | ICD-10-CM | POA: Diagnosis not present

## 2015-06-11 DIAGNOSIS — Z79899 Other long term (current) drug therapy: Secondary | ICD-10-CM

## 2015-06-11 DIAGNOSIS — Z9282 Status post administration of tPA (rtPA) in a different facility within the last 24 hours prior to admission to current facility: Secondary | ICD-10-CM | POA: Diagnosis not present

## 2015-06-11 DIAGNOSIS — K219 Gastro-esophageal reflux disease without esophagitis: Secondary | ICD-10-CM | POA: Diagnosis present

## 2015-06-11 DIAGNOSIS — R519 Headache, unspecified: Secondary | ICD-10-CM | POA: Diagnosis present

## 2015-06-11 DIAGNOSIS — Z853 Personal history of malignant neoplasm of breast: Secondary | ICD-10-CM | POA: Diagnosis not present

## 2015-06-11 DIAGNOSIS — F17210 Nicotine dependence, cigarettes, uncomplicated: Secondary | ICD-10-CM | POA: Diagnosis present

## 2015-06-11 DIAGNOSIS — I6330 Cerebral infarction due to thrombosis of unspecified cerebral artery: Secondary | ICD-10-CM | POA: Diagnosis not present

## 2015-06-11 DIAGNOSIS — I6350 Cerebral infarction due to unspecified occlusion or stenosis of unspecified cerebral artery: Secondary | ICD-10-CM | POA: Diagnosis not present

## 2015-06-11 DIAGNOSIS — E876 Hypokalemia: Secondary | ICD-10-CM

## 2015-06-11 DIAGNOSIS — I6789 Other cerebrovascular disease: Secondary | ICD-10-CM | POA: Diagnosis not present

## 2015-06-11 HISTORY — DX: Cerebral infarction, unspecified: I63.9

## 2015-06-11 LAB — COMPREHENSIVE METABOLIC PANEL
ALT: 15 U/L (ref 14–54)
AST: 27 U/L (ref 15–41)
Albumin: 3 g/dL — ABNORMAL LOW (ref 3.5–5.0)
Alkaline Phosphatase: 78 U/L (ref 38–126)
Anion gap: 6 (ref 5–15)
BILIRUBIN TOTAL: 0.7 mg/dL (ref 0.3–1.2)
BUN: 6 mg/dL (ref 6–20)
CALCIUM: 7.7 mg/dL — AB (ref 8.9–10.3)
CHLORIDE: 110 mmol/L (ref 101–111)
CO2: 26 mmol/L (ref 22–32)
CREATININE: 0.75 mg/dL (ref 0.44–1.00)
Glucose, Bld: 108 mg/dL — ABNORMAL HIGH (ref 65–99)
Potassium: 3.2 mmol/L — ABNORMAL LOW (ref 3.5–5.1)
Sodium: 142 mmol/L (ref 135–145)
TOTAL PROTEIN: 5.4 g/dL — AB (ref 6.5–8.1)

## 2015-06-11 LAB — CBC WITH DIFFERENTIAL/PLATELET
BASOS PCT: 1 %
Basophils Absolute: 0.1 10*3/uL (ref 0–0.1)
EOS PCT: 1 %
Eosinophils Absolute: 0.1 10*3/uL (ref 0–0.7)
HEMATOCRIT: 42.3 % (ref 35.0–47.0)
Hemoglobin: 14.4 g/dL (ref 12.0–16.0)
Lymphocytes Relative: 20 %
Lymphs Abs: 1.8 10*3/uL (ref 1.0–3.6)
MCH: 39.6 pg — AB (ref 26.0–34.0)
MCHC: 33.9 g/dL (ref 32.0–36.0)
MCV: 116.6 fL — AB (ref 80.0–100.0)
MONO ABS: 0.6 10*3/uL (ref 0.2–0.9)
Monocytes Relative: 6 %
Neutro Abs: 6.5 10*3/uL (ref 1.4–6.5)
Neutrophils Relative %: 72 %
PLATELETS: 272 10*3/uL (ref 150–440)
RBC: 3.63 MIL/uL — AB (ref 3.80–5.20)
RDW: 18.4 % — ABNORMAL HIGH (ref 11.5–14.5)
WBC: 9.2 10*3/uL (ref 3.6–11.0)

## 2015-06-11 LAB — TROPONIN I: Troponin I: <0.03 ng/mL (ref <0.031)

## 2015-06-11 LAB — PROTIME-INR
INR: 0.99
PROTHROMBIN TIME: 13.3 s (ref 11.4–15.0)

## 2015-06-11 LAB — MRSA PCR SCREENING: MRSA BY PCR: NEGATIVE

## 2015-06-11 LAB — APTT

## 2015-06-11 MED ORDER — SENNOSIDES-DOCUSATE SODIUM 8.6-50 MG PO TABS: 1.0000 | ORAL_TABLET | Freq: Every evening | ORAL | Status: DC | PRN

## 2015-06-11 MED ORDER — LABETALOL HCL 5 MG/ML IV SOLN: 10.0000 mg | INTRAVENOUS | Status: DC | PRN

## 2015-06-11 MED ORDER — TRAMADOL HCL 50 MG PO TABS
50.0000 mg | ORAL_TABLET | Freq: Four times a day (QID) | ORAL | Status: DC | PRN
Start: 2015-06-11 — End: 2015-06-14
  Administered 2015-06-11 – 2015-06-14 (×5): 50 mg via ORAL
  Filled 2015-06-11 (×5): qty 1

## 2015-06-11 MED ORDER — ALTEPLASE 100 MG IV SOLR
INTRAVENOUS | Status: AC
  Administered 2015-06-11: 53.4 mg
  Filled 2015-06-11: qty 1

## 2015-06-11 MED ORDER — ACETAMINOPHEN 650 MG RE SUPP: 650.0000 mg | RECTAL | Status: DC | PRN

## 2015-06-11 MED ORDER — SODIUM CHLORIDE 0.9 % IV SOLN
INTRAVENOUS | Status: DC
  Administered 2015-06-11 – 2015-06-12 (×2): via INTRAVENOUS

## 2015-06-11 MED ORDER — STROKE: EARLY STAGES OF RECOVERY BOOK
Freq: Once | Status: AC
  Administered 2015-06-11: 1
  Filled 2015-06-11: qty 1

## 2015-06-11 MED ORDER — ANASTROZOLE 1 MG PO TABS
1.0000 mg | ORAL_TABLET | Freq: Every day | ORAL | Status: DC
  Administered 2015-06-11: 1 mg via ORAL
  Filled 2015-06-11 (×2): qty 1

## 2015-06-11 MED ORDER — ACETAMINOPHEN 325 MG PO TABS
650.0000 mg | ORAL_TABLET | ORAL | Status: DC | PRN
  Administered 2015-06-11 – 2015-06-12 (×2): 650 mg via ORAL
  Filled 2015-06-11 (×2): qty 2

## 2015-06-11 MED ORDER — ACETAMINOPHEN 500 MG PO TABS
1000.0000 mg | ORAL_TABLET | Freq: Once | ORAL | Status: AC
  Administered 2015-06-11: 1000 mg via ORAL
  Filled 2015-06-11: qty 2

## 2015-06-11 MED ORDER — PANTOPRAZOLE SODIUM 40 MG IV SOLR
40.0000 mg | Freq: Every day | INTRAVENOUS | Status: DC
  Administered 2015-06-11 – 2015-06-12 (×2): 40 mg via INTRAVENOUS
  Filled 2015-06-11 (×2): qty 40

## 2015-06-11 NOTE — ED Notes (Signed)
Patient transported to CT RN at bedside.

## 2015-06-11 NOTE — ED Provider Notes (Signed)
[facility] Emergency Department Provider Note  ____________________________________________  Time seen: Approximately 12:36 PM  I have reviewed the triage vital signs and the nursing notes.   HISTORY  Chief Complaint Extremity Weakness    HPI Tina Sharp is a 49 y.o. female with breast cancer status post mastectomy in 2015 now on arimidex, GERD who presents with sudden onset left arm numbness and weakness at 11 AM, constant since onset, moderate to severe. No modifying factors. No headache, vision change or slurred speech. No chest pain or difficulty breathing. No recent illness. No modifying factors.   Past Medical History  Diagnosis Date  . GERD (gastroesophageal reflux disease)   . Cancer [facility]) June 2015    Invasive lobular carcinoma, 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 ER/ PR+; Her 2 neu not overexpressed, microscopic positive margin (skin).    Patient Active Problem List   Diagnosis Date Noted  . 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 Malignant neoplasm of breast (female), unspecified site 04/23/2014  . Breast cancer ([place]) 02/20/2014    Past Surgical History  Procedure Laterality Date  . Laparoscopic hysterectomy  2010    [facility]  . Breast surgery Left 04/08/14    mastectomy  . Reduction mammoplasty Right 04/08/14    Dr Tula Nakayama  . Colonoscopy      No current outpatient prescriptions on file.  Allergies Review of patient's allergies indicates no known allergies.  Family History  Problem Relation Age of Onset  . Cancer Mother 61    breast  . Cancer Maternal Grandmother     breast /? age 14  . Cancer Maternal Grandfather 40    prostate    Social History Social History  Substance Use Topics  . Smoking status: Former Smoker    Quit date: 08/15/2013  . Smokeless tobacco: Never Used     Comment: E cig  . Alcohol Use: 0.0 oz/week    0 Standard drinks or equivalent per week     Comment: social    Review of  Systems Constitutional: No fever/chills Eyes: No visual changes. ENT: No sore throat. Cardiovascular: Denies chest pain. Respiratory: Denies shortness of breath. Gastrointestinal: No abdominal pain.  No nausea, no vomiting.  No diarrhea.  No constipation. Genitourinary: Negative for dysuria. Musculoskeletal: Negative for back pain. Skin: Negative for rash. Neurological: Negative for headaches, + for focal left arml weakness and numbness.  10-point ROS otherwise negative.  ____________________________________________   PHYSICAL EXAM:  VITAL SIGNS: ED Triage Vitals  Enc Vitals Group     BP 06/11/15 1226 132/89 mmHg     Pulse Rate 06/11/15 1226 98     Resp 06/11/15 1226 18     Temp 06/11/15 1226 97.8 F (36.6 C)     Temp src --      SpO2 06/11/15 1226 97 %     Weight 06/11/15 1226 145 lb (65.772 kg)     Height 06/11/15 1226 5\' 2"  (1.575 m)     Head Cir --      Peak Flow --      Pain Score 06/11/15 1231 0     Pain Loc --      Pain Edu? --      Excl. in [place]? --     Constitutional: Alert and oriented. Well appearing and in no acute distress. Eyes: Conjunctivae are normal. PERRL. EOMI. Head: Atraumatic. Nose: No congestion/rhinnorhea. Mouth/Throat: Mucous membranes are moist.  Oropharynx non-erythematous. Neck: No stridor.  Cardiovascular: Normal rate, regular rhythm. Grossly  normal heart sounds.  Good peripheral circulation. Respiratory: Normal respiratory effort.  No retractions. Lungs CTAB. Gastrointestinal: Soft and nontender. No distention. No abdominal bruits. No CVA tenderness. Genitourinary: deferred Musculoskeletal: No lower extremity tenderness nor edema.  No joint effusions. Neurologic:  Normal speech and language. 4/4 strength in the left upper extremity with decreased sensation to light touch. 5 out of 5 strength in the right upper extremity and bilateral lower extremities with normal sensation. No pronator drift. Slight left facial droop with tongue  deviation. Skin:  Skin is warm, dry and intact. No rash noted. Psychiatric: Mood and affect are normal. Speech and behavior are normal.  ____________________________________________   LABS (all labs ordered are listed, but only abnormal results are displayed)  Labs Reviewed  CBC WITH DIFFERENTIAL/PLATELET - Abnormal; Notable for the following:    RBC 3.63 (*)    MCV 116.6 (*)    MCH 39.6 (*)    RDW 18.4 (*)    All other components within normal limits  COMPREHENSIVE METABOLIC PANEL - Abnormal; Notable for the following:    Potassium 3.2 (*)    Glucose, Bld 108 (*)    Calcium 7.7 (*)    Total Protein 5.4 (*)    Albumin 3.0 (*)    All other components within normal limits  APTT - Abnormal; Notable for the following:    aPTT <23 (*)    All other components within normal limits  PROTIME-INR  TROPONIN I  CBG MONITORING, ED   ____________________________________________  EKG  ED ECG REPORT I, Joanne Gavel, the attending physician, personally viewed and interpreted this ECG.   Date: 06/11/2015  EKG Time: 12:48  Rate: 100  Rhythm: sinus tachycardia  Axis: normal  Intervals:none  ST&T Change: No acute ST elevation. Q waves in lead 3, aVF. T-wave inversion in V2, V3.  ____________________________________________  RADIOLOGY  CXR IMPRESSION: No radiographic evidence of acute cardiopulmonary abnormality.   CT head IMPRESSION: 1. Negative for bleed or other acute intracranial process. Critical Value/emergent results were called by telephone at the time of interpretation on 06/11/2015 at 12:45 pm to Dr. Loura Pardon , who verbally acknowledged these results. ____________________________________________   PROCEDURES  Procedure(s) performed: None  Critical Care performed: Yes, see critical care note(s). Total critical care time spent 45 minutes.  ____________________________________________   INITIAL IMPRESSION / ASSESSMENT AND PLAN / ED COURSE  Pertinent  labs & imaging results that were available during my care of the patient were reviewed by me and considered in my medical decision making (see chart for details).  Tina Sharp is a 49 y.o. female with breast cancer status post mastectomy in 2015 now on arimidex, GERD who presents with sudden onset left arm numbness and weakness at 11 AM. On arrival to the emergency department, normal glucose, NIH stroke scale of 2 with notable weakness in the left upper extremity with decreased sensation, mild facial droop and tongue deviation. Code stroke initiated on arrival. CT head negative for any acute intracranial abnormality and the patient is currently being evaluated by the tele neurologist on-call.   ----------------------------------------- 1:41 PM on 06/11/2015 -----------------------------------------  Dr. Lambert Mody, teleneurologist has evaluated the patient, recommends proceeding with TPA. The patient has been consented for TPA, she voices understanding of risks and benefits she has signed consent form. There are no contradindications.  ----------------------------------------- 2:05 PM on 06/11/2015 -----------------------------------------  Patient remains awake alert oriented after TPA. Still with unchanged neurological exam though she is complaining of worsening headache. We'll continue  to monitor. Patient will be transferred to [facility] for admission to the neuro ICU. I discussed the case with the [facility] intensivist, Dr. Doy Mince, who was accepted transfer. ____________________________________________   FINAL CLINICAL IMPRESSION(S) / ED DIAGNOSES  Final diagnoses:  Weakness of left arm  Cerebral infarction due to unspecified mechanism      Joanne Gavel, MD 06/11/15 2221

## 2015-06-11 NOTE — ED Notes (Signed)
25 yof PMHx +smoker, breast Ca (s/p left mastectomy 2015), GERD presents via EMS with sudden onset left arm numbness/weakness at 1100 am while driving. Glucose 138. #20g rt AC per EMS.

## 2015-06-11 NOTE — ED Notes (Signed)
All BPs are done manually at this time on pts right arm

## 2015-06-11 NOTE — ED Notes (Signed)
MD on video phone states pt does not need foley in this case due to pts A&Ox4

## 2015-06-11 NOTE — Plan of Care (Signed)
Problem: Consults Goal: Ischemic Stroke Patient Education See Patient Education Module for education specifics. Outcome: Completed/Met Date Met:  06/11/15 Mapping Out Stroke booklet given to patient

## 2015-06-11 NOTE — ED Notes (Signed)
12:23 PM : MD aware of pt, @ BS 12:25 PM : Code Stroke paged, pt undressed, IV #20g right arm, labs drawn, cardiac monitor.  12:34 PM : To CT scan.

## 2015-06-11 NOTE — Progress Notes (Signed)
   06/11/15 1300  Clinical Encounter Type  Visited With Patient;Family  Visit Type Code  Consult/Referral To Chaplain  Chaplain responded to code stroke and met family in waiting room. Family informed me that their pastor was on his way but if they needed me they would let nurse know. I met with patient after her return from scan and offered pastoral care.  [place] [phone]
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
## 2015-06-12 ENCOUNTER — Encounter (HOSPITAL_COMMUNITY): Payer: Self-pay | Admitting: *Deleted

## 2015-06-12 ENCOUNTER — Inpatient Hospital Stay (HOSPITAL_COMMUNITY): Payer: BLUE CROSS/BLUE SHIELD

## 2015-06-12 DIAGNOSIS — I6330 Cerebral infarction due to thrombosis of unspecified cerebral artery: Secondary | ICD-10-CM

## 2015-06-12 DIAGNOSIS — E876 Hypokalemia: Secondary | ICD-10-CM

## 2015-06-12 LAB — COMPREHENSIVE METABOLIC PANEL
ALT: 12 U/L — AB (ref 14–54)
AST: 19 U/L (ref 15–41)
Albumin: 2.2 g/dL — ABNORMAL LOW (ref 3.5–5.0)
Alkaline Phosphatase: 60 U/L (ref 38–126)
Anion gap: 7 (ref 5–15)
BUN: 6 mg/dL (ref 6–20)
CHLORIDE: 111 mmol/L (ref 101–111)
CO2: 23 mmol/L (ref 22–32)
CREATININE: 0.7 mg/dL (ref 0.44–1.00)
Calcium: 7.5 mg/dL — ABNORMAL LOW (ref 8.9–10.3)
GFR calc Af Amer: >60 mL/min (ref >60)
Glucose, Bld: 85 mg/dL (ref 65–99)
Potassium: 2.9 mmol/L — ABNORMAL LOW (ref 3.5–5.1)
Sodium: 141 mmol/L (ref 135–145)
Total Bilirubin: 0.7 mg/dL (ref 0.3–1.2)
Total Protein: 4.3 g/dL — ABNORMAL LOW (ref 6.5–8.1)

## 2015-06-12 LAB — LIPID PANEL
CHOL/HDL RATIO: 2.9 ratio
Cholesterol: 129 mg/dL (ref 0–200)
HDL: 45 mg/dL (ref >40)
LDL CALC: 65 mg/dL (ref 0–99)
TRIGLYCERIDES: 93 mg/dL (ref <150)
VLDL: 19 mg/dL (ref 0–40)

## 2015-06-12 MED ORDER — POTASSIUM CHLORIDE CRYS ER 20 MEQ PO TBCR
20.0000 meq | EXTENDED_RELEASE_TABLET | Freq: Two times a day (BID) | ORAL | Status: DC
  Administered 2015-06-12 – 2015-06-14 (×5): 20 meq via ORAL
  Filled 2015-06-12 (×5): qty 1

## 2015-06-12 MED ORDER — POTASSIUM CHLORIDE 10 MEQ/100ML IV SOLN
10.0000 meq | INTRAVENOUS | Status: DC
  Administered 2015-06-12: 10 meq via INTRAVENOUS
  Filled 2015-06-12 (×4): qty 100

## 2015-06-12 MED ORDER — POTASSIUM CHLORIDE CRYS ER 20 MEQ PO TBCR
20.0000 meq | EXTENDED_RELEASE_TABLET | ORAL | Status: AC
  Administered 2015-06-12 (×2): 20 meq via ORAL
  Filled 2015-06-12 (×2): qty 1

## 2015-06-12 MED ORDER — ASPIRIN 325 MG PO TABS
325.0000 mg | ORAL_TABLET | Freq: Every day | ORAL | Status: DC
  Administered 2015-06-12: 325 mg via ORAL
  Filled 2015-06-12 (×2): qty 1

## 2015-06-12 MED ORDER — ANASTROZOLE 1 MG PO TABS
1.0000 mg | ORAL_TABLET | Freq: Every day | ORAL | Status: DC
  Administered 2015-06-12 – 2015-06-13 (×2): 1 mg via ORAL
  Filled 2015-06-12 (×3): qty 1

## 2015-06-12 NOTE — Progress Notes (Signed)
PT Cancellation Note  Patient Details Name: LUNA AUDIA MRN: [id number] DOB: Dec 13, 1965   Cancelled Treatment:    Reason Eval/Treat Not Completed: Patient not medically ready. Pt on bedrest until 13:39. PT to return as able to complete evaluation.   Kingsley Callander 06/12/2015, 10:59 AM  Kittie Plater, PT, DPT Pager #: [phone] Office #: [phone]
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
## 2015-06-12 NOTE — Progress Notes (Signed)
*  PRELIMINARY RESULTS* Vascular Ultrasound Carotid Duplex (Doppler) has been completed.  Findings suggest low range 40-59% right internal carotid artery stenosis and 1-39% left internal carotid artery stenosis. Vertebral arteries are patent with antegrade flow.  06/12/2015 3:47 PM Maudry Mayhew, RVT, RDCS, RDMS
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
## 2015-06-12 NOTE — Evaluation (Signed)
Speech Language Pathology Evaluation Patient Details Name: Tina Sharp MRN: [id number] DOB: 1966-07-03 Today's Date: 06/12/2015 Time: 5003-7048 SLP Time Calculation (min) (ACUTE ONLY): 17 min  Problem List:  Patient Active Problem List   Diagnosis Date Noted  . 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 Malignant neoplasm of breast (female), unspecified site 04/23/2014  . Breast cancer ([place]) 02/20/2014   Past Medical History:  Past Medical History  Diagnosis Date  . GERD (gastroesophageal reflux disease)   . Cancer [facility]) June 2015    Invasive lobular carcinoma, 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 ER/ PR+; Her 2 neu not overexpressed, microscopic positive margin (skin).   Past Surgical History:  Past Surgical History  Procedure Laterality Date  . Laparoscopic hysterectomy  2010    [facility]  . Breast surgery Left 04/08/14    mastectomy  . Reduction mammoplasty Right 04/08/14    Dr Tula Nakayama  . Colonoscopy     HPI:  49 y.o. female admitted with LUE numbness and weakness; CCT negative.  tPA administered.  MRI pending.    Assessment / Plan / Recommendation Clinical Impression  Pt presents with no cognitive-linguistic deficits - normal speech/language, orientation, recall, and awareness.  No SLP f/u warranted.      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[employer])   Cognition  Overall Cognitive Status: Within Functional Limits for tasks assessed Arousal/Alertness: Awake/alert Orientation Level: Oriented X4 Attention: Alternating Alternating Attention: Appears  intact Awareness: Appears intact Problem Solving: Appears intact Safety/Judgment: Appears intact    Comprehension  Auditory Comprehension Overall Auditory Comprehension: Appears within functional limits for tasks assessed Visual Recognition/Discrimination Discrimination: Within Function Limits Reading Comprehension Reading Status: Within funtional limits    Expression Expression Primary Mode of Expression: Verbal Verbal Expression Overall Verbal Expression: Appears within functional limits for tasks assessed Written Expression Dominant Hand: Right Written Expression: Within Functional Limits   Oral / Motor Oral Motor/Sensory Function Overall Oral Motor/Sensory Function: Appears within functional limits for tasks assessed Motor Speech Overall Motor Speech: Appears within functional limits for tasks assessed   GO     Juan Quam Laurice 06/12/2015, 2:38 PM

## 2015-06-13 ENCOUNTER — Inpatient Hospital Stay (HOSPITAL_COMMUNITY): Payer: BLUE CROSS/BLUE SHIELD

## 2015-06-13 DIAGNOSIS — I63031 Cerebral infarction due to thrombosis of right carotid artery: Secondary | ICD-10-CM

## 2015-06-13 DIAGNOSIS — I6789 Other cerebrovascular disease: Secondary | ICD-10-CM

## 2015-06-13 LAB — SEDIMENTATION RATE: SED RATE: 7 mm/h (ref 0–22)

## 2015-06-13 LAB — COMPREHENSIVE METABOLIC PANEL
ALK PHOS: 60 U/L (ref 38–126)
ALT: 16 U/L (ref 14–54)
ANION GAP: 4 — AB (ref 5–15)
AST: 25 U/L (ref 15–41)
Albumin: 2.5 g/dL — ABNORMAL LOW (ref 3.5–5.0)
BILIRUBIN TOTAL: 0.5 mg/dL (ref 0.3–1.2)
BUN: <5 mg/dL — ABNORMAL LOW (ref 6–20)
CHLORIDE: 111 mmol/L (ref 101–111)
CO2: 25 mmol/L (ref 22–32)
Calcium: 8.1 mg/dL — ABNORMAL LOW (ref 8.9–10.3)
Creatinine, Ser: 0.67 mg/dL (ref 0.44–1.00)
GFR calc Af Amer: >60 mL/min (ref >60)
GFR calc non Af Amer: >60 mL/min (ref >60)
GLUCOSE: 89 mg/dL (ref 65–99)
POTASSIUM: 4.5 mmol/L (ref 3.5–5.1)
SODIUM: 140 mmol/L (ref 135–145)
TOTAL PROTEIN: 4.5 g/dL — AB (ref 6.5–8.1)

## 2015-06-13 LAB — CBC
HEMATOCRIT: 35.9 % — AB (ref 36.0–46.0)
HEMOGLOBIN: 11.9 g/dL — AB (ref 12.0–15.0)
MCH: 38.5 pg — ABNORMAL HIGH (ref 26.0–34.0)
MCHC: 33.1 g/dL (ref 30.0–36.0)
MCV: 116.2 fL — AB (ref 78.0–100.0)
Platelets: 237 10*3/uL (ref 150–400)
RBC: 3.09 MIL/uL — ABNORMAL LOW (ref 3.87–5.11)
RDW: 15.7 % — ABNORMAL HIGH (ref 11.5–15.5)
WBC: 6.8 10*3/uL (ref 4.0–10.5)

## 2015-06-13 LAB — HEMOGLOBIN A1C
HEMOGLOBIN A1C: 5.4 % (ref 4.8–5.6)
Mean Plasma Glucose: 108 mg/dL

## 2015-06-13 LAB — ANTITHROMBIN III: AntiThromb III Func: 109 % (ref 75–120)

## 2015-06-13 LAB — VITAMIN B12: Vitamin B-12: 211 pg/mL (ref 180–914)

## 2015-06-13 LAB — TSH: TSH: 1.633 u[IU]/mL (ref 0.350–4.500)

## 2015-06-13 MED ORDER — ASPIRIN EC 325 MG PO TBEC
325.0000 mg | DELAYED_RELEASE_TABLET | Freq: Every day | ORAL | Status: DC
  Administered 2015-06-13 – 2015-06-14 (×2): 325 mg via ORAL
  Filled 2015-06-13 (×2): qty 1

## 2015-06-13 MED ORDER — PANTOPRAZOLE SODIUM 40 MG PO TBEC
40.0000 mg | DELAYED_RELEASE_TABLET | Freq: Every day | ORAL | Status: DC
  Administered 2015-06-13: 40 mg via ORAL
  Filled 2015-06-13: qty 1
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
## 2015-06-13 NOTE — Progress Notes (Signed)
  Echocardiogram 2D Echocardiogram has been performed.  Tina Sharp 06/13/2015, 12:05 PM
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
## 2015-06-13 NOTE — Care Management Note (Signed)
Case Management Note  Patient Details  Name: Tina Sharp MRN: [id number] Date of Birth: [birth year]  Subjective/Objective:     Pt admitted on 06/11/15 with CVA.  PTA, pt independent, lives with spouse.                 Action/Plan: PT recommending no OP follow up.  Will follow for discharge planning as pt progresses.    Expected Discharge Date:                  Expected Discharge Plan:  Home/Self Care  In-House Referral:     Discharge planning Services  CM Consult  Post Acute Care Choice:    Choice offered to:     DME Arranged:    DME Agency:     HH Arranged:    HH Agency:     Status of Service:  In process, will continue to follow  Medicare Important Message Given:    Date Medicare IM Given:    Medicare IM give by:    Date Additional Medicare IM Given:    Additional Medicare Important Message give by:     If discussed at [place] of Stay Meetings, dates discussed:    Additional Comments:  Reinaldo Raddle, RN, BSN  Trauma/Neuro ICU Case Manager [phone]

## 2015-06-14 ENCOUNTER — Encounter (HOSPITAL_COMMUNITY)
Admission: EM | Disposition: A | Discharge status: Home / Self Care | Payer: Self-pay | Source: Other Acute Inpatient Hospital | Attending: Neurology

## 2015-06-14 ENCOUNTER — Encounter (HOSPITAL_COMMUNITY): Payer: Self-pay

## 2015-06-14 ENCOUNTER — Inpatient Hospital Stay (HOSPITAL_COMMUNITY): Payer: BLUE CROSS/BLUE SHIELD

## 2015-06-14 DIAGNOSIS — I639 Cerebral infarction, unspecified: Secondary | ICD-10-CM

## 2015-06-14 DIAGNOSIS — R51 Headache: Secondary | ICD-10-CM

## 2015-06-14 DIAGNOSIS — I63339 Cerebral infarction due to thrombosis of unspecified posterior cerebral artery: Secondary | ICD-10-CM

## 2015-06-14 DIAGNOSIS — I10 Essential (primary) hypertension: Secondary | ICD-10-CM | POA: Diagnosis present

## 2015-06-14 DIAGNOSIS — R519 Headache, unspecified: Secondary | ICD-10-CM | POA: Diagnosis present

## 2015-06-14 DIAGNOSIS — F17210 Nicotine dependence, cigarettes, uncomplicated: Secondary | ICD-10-CM | POA: Diagnosis present

## 2015-06-14 HISTORY — PX: TEE WITHOUT CARDIOVERSION: SHX5443

## 2015-06-14 HISTORY — PX: EP IMPLANTABLE DEVICE: SHX172B

## 2015-06-14 LAB — PROTEIN S ACTIVITY: Protein S Activity: 139 % (ref 63–140)

## 2015-06-14 LAB — FOLATE RBC
FOLATE, HEMOLYSATE: 193.3 ng/mL
Folate, RBC: 527 ng/mL (ref >498)
HEMATOCRIT: 36.7 % (ref 34.0–46.6)

## 2015-06-14 LAB — CALCIUM, IONIZED: Calcium, Ionized, Serum: 5.1 mg/dL (ref 4.5–5.6)

## 2015-06-14 LAB — PROTEIN S, TOTAL: Protein S Ag, Total: 143 % (ref 60–150)

## 2015-06-14 LAB — HOMOCYSTEINE: Homocysteine: 36.2 umol/L — ABNORMAL HIGH (ref 0.0–15.0)

## 2015-06-14 LAB — LUPUS ANTICOAGULANT PANEL
DRVVT: 33.9 s (ref 0.0–44.0)
PTT LA: 28.7 s (ref 0.0–40.6)

## 2015-06-14 LAB — PROTEIN C ACTIVITY: PROTEIN C ACTIVITY: 118 % (ref 73–180)

## 2015-06-14 SURGERY — ECHOCARDIOGRAM, TRANSESOPHAGEAL
Anesthesia: Moderate Sedation

## 2015-06-14 SURGERY — LOOP RECORDER INSERTION

## 2015-06-14 MED ORDER — LIDOCAINE-EPINEPHRINE 1 %-1:100000 IJ SOLN
INTRAMUSCULAR | Status: AC
  Filled 2015-06-14: qty 1

## 2015-06-14 MED ORDER — MIDAZOLAM HCL 5 MG/ML IJ SOLN
INTRAMUSCULAR | Status: AC
  Filled 2015-06-14: qty 2

## 2015-06-14 MED ORDER — LIDOCAINE-EPINEPHRINE 1 %-1:100000 IJ SOLN
INTRAMUSCULAR | Status: DC | PRN
  Administered 2015-06-14: 20 mL

## 2015-06-14 MED ORDER — FENTANYL CITRATE (PF) 100 MCG/2ML IJ SOLN
INTRAMUSCULAR | Status: AC
  Filled 2015-06-14: qty 2

## 2015-06-14 MED ORDER — SODIUM CHLORIDE 0.9 % IV SOLN
INTRAVENOUS | Status: DC
  Administered 2015-06-14: 500 mL via INTRAVENOUS

## 2015-06-14 MED ORDER — TRAMADOL HCL 50 MG PO TABS: 50.0000 mg | ORAL_TABLET | Freq: Four times a day (QID) | ORAL | Status: DC | PRN

## 2015-06-14 MED ORDER — FENTANYL CITRATE (PF) 100 MCG/2ML IJ SOLN
INTRAMUSCULAR | Status: DC | PRN
  Administered 2015-06-14: 25 ug via INTRAVENOUS

## 2015-06-14 MED ORDER — MIDAZOLAM HCL 10 MG/2ML IJ SOLN
INTRAMUSCULAR | Status: DC | PRN
  Administered 2015-06-14 (×2): 2 mg via INTRAVENOUS
  Administered 2015-06-14: 1 mg via INTRAVENOUS

## 2015-06-14 MED ORDER — DIPHENHYDRAMINE HCL 50 MG/ML IJ SOLN
INTRAMUSCULAR | Status: AC
  Filled 2015-06-14: qty 1

## 2015-06-14 MED ORDER — ASPIRIN 325 MG PO TBEC: 325.0000 mg | DELAYED_RELEASE_TABLET | Freq: Every day | ORAL | Status: DC

## 2015-06-14 SURGICAL SUPPLY — 2 items
LOOP REVEAL LINQSYS (Prosthesis & Implant Heart) ×3 IMPLANT
PACK LOOP INSERTION (CUSTOM PROCEDURE TRAY) ×3 IMPLANT

## 2015-06-14 NOTE — Consult Note (Signed)
ELECTROPHYSIOLOGY CONSULT NOTE  Patient ID: Tina Sharp MRN: [id number], DOB/AGE: [birth year]   Admit date: 06/11/2015 Date of Consult: 06/14/2015  Primary Physician: Chriss Czar, MD Primary Cardiologist: new to [facility] Reason for Consultation: Cryptogenic stroke; recommendations regarding Implantable Loop Recorder  History of Present Illness SHAWNTEE Sharp was admitted on 06/11/2015 with acute onset of numbness in her left upper extremity while driving.  She had an associated right sided pinpoint headache that was throbbing and sharp. Her symptoms persisted and her daughter drove her to [facility] where imaging demonstrated acute stroke.  Tpa was administered and she was transferred to [facility] for further evaluation. .Imaging demonstrated non dominant lobe embolic infarcts of unknown etiology.  She has undergone workup for stroke including echocardiogram and carotid dopplers. Inpatient stroke work-up is to be completed with a TEE.   Echocardiogram this admission demonstrated EF 60-65%, no RWMA, grade 1 diastolic dysfunction, LA 30.  Lab work is reviewed.  Prior to admission, the patient denies chest pain, shortness of breath, dizziness, palpitations, or syncope.  They are recovering from their stroke with plans to return home at discharge.  EP has been asked to evaluate for placement of an implantable loop recorder to monitor for atrial fibrillation.  Past Medical History  Diagnosis Date  . GERD (gastroesophageal reflux disease)   . Cancer [facility]) June 2015    Invasive lobular carcinoma, 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 ER/ PR+; Her 2 neu not overexpressed, microscopic positive margin (skin).     Surgical History:  Past Surgical History  Procedure Laterality Date  . Laparoscopic hysterectomy  2010    [facility]  . Breast surgery Left 04/08/14    mastectomy  . Reduction mammoplasty Right 04/08/14    Dr Tula Nakayama  . Colonoscopy       Prescriptions prior to admission    Medication Sig Dispense Refill Last Dose  . anastrozole (ARIMIDEX) 1 MG tablet Take 1 mg by mouth every evening.    06/11/2015 at Unknown time  . Aspirin-Salicylamide-Caffeine (BC HEADACHE POWDER PO) Take 1 Package by mouth daily as needed (for headaches).   Past Month at Unknown time  . esomeprazole (NEXIUM) 20 MG capsule Take 20 mg by mouth daily as needed (for heartburn).    Past Week at Unknown time  . Hypromellose (ARTIFICIAL TEARS OP) Apply 2 drops to eye 2 (two) times daily as needed (for dry eyes).   Past Week at Unknown time  . naproxen sodium (ANAPROX) 220 MG tablet Take 220 mg by mouth 2 (two) times daily as needed (for pain).   Past Week at Unknown time  . prochlorperazine (COMPAZINE) 5 MG tablet Take 5 mg by mouth every 6 (six) hours as needed for nausea or vomiting.   Past Month at Unknown time    Inpatient Medications:  . anastrozole  1 mg Oral Daily  . aspirin EC  325 mg Oral Daily  . pantoprazole  40 mg Oral QHS  . potassium chloride  20 mEq Oral BID    Allergies: No Known Allergies  Social History   Social History  . Marital Status: Married    Spouse Name: N/A  . Number of Children: N/A  . Years of Education: N/A   Occupational History  . Not on file.   Social History Main Topics  . Smoking status: Former Smoker    Quit date: 08/15/2013  . Smokeless tobacco: Never Used     Comment: E cig  . Alcohol Use: 0.0 oz/week  0 Standard drinks or equivalent per week     Comment: social  . Drug Use: No  . Sexual Activity: Not on file   Other Topics Concern  . Not on file   Social History Narrative     Family History  Problem Relation Age of Onset  . Cancer Mother 81    breast  . Cancer Maternal Grandmother     breast /? age 64  . Cancer Maternal Grandfather 80    prostate      Review of Systems: All other systems reviewed and are otherwise negative except as noted above.  Physical Exam: Filed Vitals:   06/13/15 1828 06/13/15 2204 06/14/15 0209  06/14/15 0603  BP: 133/70 119/64 107/69 126/70  Pulse: 77 74 68 70  Temp: 98.7 F (37.1 C) 98.4 F (36.9 C) 98.6 F (37 C) 98.7 F (37.1 C)  TempSrc: Oral Oral Oral Oral  Resp: 18 18 20 20   Height:      Weight:      SpO2: 99% 96% 97% 97%    GEN- The patient is well appearing, alert and oriented x 3 today.   Head- normocephalic, atraumatic Eyes-  Sclera clear, conjunctiva pink Ears- hearing intact Oropharynx- clear Neck- supple Lungs- Clear to ausculation bilaterally, normal work of breathing Heart- Regular rate and rhythm  GI- soft, NT, ND, + BS Extremities- no clubbing, cyanosis, or edema MS- no significant deformity or atrophy Skin- no rash or lesion Psych- euthymic mood, full affect   Labs:   Lab Results  Component Value Date   WBC 6.8 06/13/2015   HGB 11.9* 06/13/2015   HCT 35.9* 06/13/2015   MCV 116.2* 06/13/2015   PLT 237 06/13/2015    Recent Labs Lab 06/13/15 0250  NA 140  K 4.5  CL 111  CO2 25  BUN <5*  CREATININE 0.67  CALCIUM 8.1*  PROT 4.5*  BILITOT 0.5  ALKPHOS 60  ALT 16  AST 25  GLUCOSE 89     Radiology/Studies: Ct Head Wo Contrast 06/11/2015  CLINICAL DATA:  Code Stroke. Pt with left hand numbness and weakness that started at 11am while driving today. Pt denies any other symptoms at this time. Pt denies hx of stroke, seizure, or ca. EXAM: CT HEAD WITHOUT CONTRAST TECHNIQUE: Contiguous axial images were obtained from the base of the skull through the vertex without intravenous contrast. COMPARISON:  None. FINDINGS: Mild atrophy. There is no evidence of acute intracranial hemorrhage, brain edema, mass lesion, acute infarction, mass effect, or midline shift. Acute infarct may be inapparent on noncontrast CT. No other intra-axial abnormalities are seen, and the ventricles and sulci are within normal limits in size and symmetry. No abnormal extra-axial fluid collections or masses are identified. No significant calvarial abnormality. IMPRESSION: 1.  Negative for bleed or other acute intracranial process. Critical Value/emergent results were called by telephone at the time of interpretation on 06/11/2015 at 12:45 pm to Dr. Loura Pardon , who verbally acknowledged these results. Electronically Signed   By: Lucrezia Europe M.D.   On: 06/11/2015 12:48   Mr Brain Wo Contrast 06/12/2015  CLINICAL DATA:  Patient developed acute onset of LEFT hand numbness and weakness 06/11/2015 at the same time she developed a severe RIGHT-sided headache. History of breast cancer. Current smoker. TPA administered. Initial encounter. EXAM: MRI HEAD WITHOUT CONTRAST MRA HEAD WITHOUT CONTRAST TECHNIQUE: Multiplanar, multiecho pulse sequences of the brain and surrounding structures were obtained without intravenous contrast. Angiographic images of the head were obtained using  MRA technique without contrast. COMPARISON:  CT head 06/11/2015. FINDINGS: MRI HEAD FINDINGS Multifocal areas of acute infarction throughout primarily the RIGHT MCA territory involving the frontal, parietal, temporal, and insular regions. These are predominantly cortical rather than subcortical. Small area of RIGHT caudate involvement. No LEFT hemisphere involvement, brainstem or cerebellar involvement. Some areas of cortical cytotoxic edema efface the sulci, but there is no subarachnoid blood seen on FLAIR imaging. No hemorrhage, mass lesion, or extra-axial fluid. Slight atrophy without hydrocephalus. No significant white matter disease. No foci of chronic hemorrhage. Flow voids are maintained. No sinus or mastoid disease.  Normal midline structures. Within limits for detection on noncontrast MR, no visible intracranial metastatic disease. Compared with prior CT, there is no hemorrhage and no visible cytotoxic edema on that study. MRA HEAD FINDINGS The internal carotid arteries are dolichoectatic but widely patent. The basilar artery is widely patent with both vertebrals contributing equally. Fetal origin RIGHT PCA. No  proximal stenosis of the middle or posterior cerebral arteries. Hypoplastic and/or diffusely diseased RIGHT A1 ACA of no significance given the robust and dominant LEFT A1 ACA. The RIGHT M3 and M4 branches of the middle cerebral artery appear more irregular and beaded than those on the LEFT. In the appropriate clinical setting, this could represent manifestation of reversible cerebral vasoconstriction syndrome (RCVS). No intracranial aneurysm. IMPRESSION: Multifocal areas of nonhemorrhagic acute infarction throughout the RIGHT hemisphere, predominantly cortical involvement. No proximal flow reducing lesion is observed on MRA, but there is subtle irregularity and beading of the distal RIGHT MCA branches as compared to the LEFT. 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 Multiple emboli are less favored. No evidence for post tPA complication. Electronically Signed   By: Staci Righter M.D.   On: 06/12/2015 14:58   All prior EKG's in EPIC reviewed with no documented atrial fibrillation  Assessment and Plan:  1. Cryptogenic stroke The patient presents with cryptogenic stroke.  The patient has a TEE planned for this AM.  I spoke at length with the patient about monitoring for afib with either a 30 day event monitor or an implantable loop recorder.  Risks, benefits, and alteratives to implantable loop recorder were discussed with the patient today.   At this time, the patient is very clear in their decision to proceed with implantable loop recorder.   She has had previous mastectomy with subsequent left breast augmentation. May need to consider inframammary implant, will discuss with Dr Lovena Le.   Wound care was reviewed with the patient (keep incision clean and dry for 3 days).  Wound check scheduled for 06-21-15 at [facility] in our [place] office.  Please call with questions.   Patsey Berthold,  NP 06/14/2015 9:16 AM   EP Attending  Patient seen and examined. CV - RRR, lungs - clear. Ext. -no edema.  Agree with the findings as noted above by Chanetta Marshall, NP-C. The patient has had a cryptogenic stroke. I have recommended she undergo insertion of an ILR as she is at increased risk for atrial fib.  Mikle Bosworth.D.

## 2015-06-14 NOTE — Progress Notes (Signed)
Occupational Therapy Treatment Patient Details Name: AMIAYA MCNEELEY MRN: [id number] DOB: 04/17/1966 Today's Date: 06/14/2015    History of present illness Pt is a 49 y/o F who reported acute onset of numbness of Lt UE when driving.  Pt has had non dominant infarct of unknown etiology.  Pt's PMH includes breast cancer.   OT comments  Focus of session on strengthening L UE and fine motor activities nursing teach back method.  Pt tolerated well. Pt has been incorporating use of her L UE in ADL as much as possible.  Follow Up Recommendations  Outpatient OT    Equipment Recommendations  None recommended by OT    Recommendations for Other Services      Precautions / Restrictions Precautions Precautions: None Restrictions Weight Bearing Restrictions: No       Mobility Bed Mobility Overal bed mobility: Independent                Transfers Overall transfer level: Independent                    Balance                                   ADL       Grooming: Wash/dry hands;Oral care;Modified independent;Standing                   Toilet Transfer: Independent;Ambulation   Toileting- Clothing Manipulation and Hygiene: Modified independent;Sit to/from stand Toileting - Clothing Manipulation Details (indicate cue type and reason): pt has been routinely taking herself to the bathroom     Functional mobility during ADLs: Independent        Vision                     Perception     Praxis      Cognition   Behavior During Therapy: [facility] for tasks assessed/performed Overall Cognitive Status: Within Functional Limits for tasks assessed                       Extremity/Trunk Assessment               Exercises General Exercises - Upper Extremity Shoulder Flexion: Strengthening;Left;10 reps;Seated;Bar weights/barbell Bar Weights/Barbell (Shoulder Flexion): 1 lb Shoulder Extension: Strengthening;Left;10  reps;Seated;Bar weights/barbell Bar Weights/Barbell (Shoulder Extension): 1 lb Elbow Flexion: Strengthening;Left;10 reps;Seated;Bar weights/barbell Bar Weights/Barbell (Elbow Flexion): 1 lb Elbow Extension: Strengthening;Left;10 reps;Seated;Bar weights/barbell Bar Weights/Barbell (Elbow Extension): 1 lb Wrist Flexion: AROM;Left;10 reps;Supine Wrist Extension: AROM;Left;10 reps;Seated Other Exercises Other Exercises: Theraputty exercises--gross grasp/release, tip pinch, 3 jaw chuck,poke, spreading fingers,  Other Exercises: finger isolation on L with hand on table, alternating thumb to finger tips   Shoulder Instructions       General Comments      Pertinent Vitals/ Pain       Pain Assessment: No/denies pain  Home Living                                          Prior Functioning/Environment              Frequency Min 3X/week     Progress Toward Goals  OT Goals(current goals can now be found in the care plan section)  Progress towards OT goals: Progressing toward goals  Acute Rehab OT Goals 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

## 2015-06-14 NOTE — Progress Notes (Signed)
  Echocardiogram Echocardiogram Transesophageal has been performed.  Tina Sharp 06/14/2015, 10:47 AM
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
## 2015-06-14 NOTE — CV Procedure (Signed)
TEE: 5 mg versed 25 ug fentanyl  Normal TEE Trivial MR No SOE Negative bubble study No aortic debris No LAA thrombus EF 60%  Jenkins Rouge

## 2015-06-14 NOTE — Care Management Note (Signed)
Case Management Note  Patient Details  Name: QUINTANA CANELO MRN: [id number] Date of Birth: 09/12/1965  Subjective/Objective:                    Action/Plan: Patient being discharged to home today. OT recommending outpatient OT and order placed by Burnetta Sabin, NP. CM spoke with the patient and she is interested in attending the outpatient Neurorehabilitation in [place]. Patient feels either her mother or daughter would be able to provide transportation. Order placed into EPIC and patient given the map for Neurorehab with their contact number and information placed on AVS.   Expected Discharge Date:                  Expected Discharge Plan:  Home/Self Care  In-House Referral:     Discharge planning Services  CM Consult  Post Acute Care Choice:    Choice offered to:     DME Arranged:    DME Agency:     HH Arranged:    [place] Agency:     Status of Service:  Completed, signed off  Medicare Important Message Given:    Date Medicare IM Given:    Medicare IM give by:    Date Additional Medicare IM Given:    Additional Medicare Important Message give by:     If discussed at [place] of Stay Meetings, dates discussed:    Additional Comments:  Pollie Friar, RN 06/14/2015, 2:33 PM

## 2015-06-14 NOTE — Interval H&P Note (Signed)
History and Physical Interval Note:  06/14/2015 8:00 AM  Tina Sharp  has presented today for surgery, with the diagnosis of stroke  The various methods of treatment have been discussed with the patient and family. After consideration of risks, benefits and other options for treatment, the patient has consented to  Procedure(s): TRANSESOPHAGEAL ECHOCARDIOGRAM (TEE) (N/A) as a surgical intervention .  The patient's history has been reviewed, patient examined, no change in status, stable for surgery.  I have reviewed the patient's chart and labs.  Questions were answered to the patient's satisfaction.     Jenkins Rouge

## 2015-06-14 NOTE — Progress Notes (Signed)
Discharge orders received. Pt and spouse educated on discharge instructions. Pt verbalized understanding. Pt given discharge packet and prescription. IV and tele removed. Pt dressed self and packed belongings. Pt taken downstairs by staff via wheelchair.

## 2015-06-15 ENCOUNTER — Telehealth: Payer: Self-pay | Admitting: *Deleted

## 2015-06-15 ENCOUNTER — Other Ambulatory Visit: Payer: Self-pay | Admitting: Internal Medicine

## 2015-06-15 ENCOUNTER — Encounter (HOSPITAL_COMMUNITY): Payer: Self-pay | Admitting: Cardiovascular Disease

## 2015-06-15 LAB — BETA-2-GLYCOPROTEIN I ABS, IGG/M/A
Beta-2-Glycoprotein I IgA: <9 GPI IgA units (ref 0–25)
Beta-2-Glycoprotein I IgM: <9 GPI IgM units (ref 0–32)

## 2015-06-15 LAB — CARDIOLIPIN ANTIBODIES, IGG, IGM, IGA
Anticardiolipin IgA: <9 APL U/mL (ref 0–11)
Anticardiolipin IgG: <9 GPL U/mL (ref 0–14)

## 2015-06-15 LAB — PROTEIN C, TOTAL: Protein C, Total: 87 % (ref 60–150)

## 2015-06-15 MED ORDER — ANASTROZOLE 1 MG PO TABS: 1.0000 mg | ORAL_TABLET | Freq: Every evening | ORAL | Status: DC

## 2015-06-15 NOTE — Telephone Encounter (Signed)
Patient scheduled appt for Monday and a 1 week refill was secribed

## 2015-06-16 ENCOUNTER — Ambulatory Visit: Payer: BLUE CROSS/BLUE SHIELD | Attending: Nurse Practitioner | Admitting: Occupational Therapy

## 2015-06-16 ENCOUNTER — Encounter: Payer: Self-pay | Admitting: Occupational Therapy

## 2015-06-16 ENCOUNTER — Telehealth: Payer: Self-pay

## 2015-06-16 ENCOUNTER — Telehealth: Payer: Self-pay | Admitting: Neurology

## 2015-06-16 ENCOUNTER — Telehealth: Payer: Self-pay | Admitting: Internal Medicine

## 2015-06-16 DIAGNOSIS — R201 Hypoesthesia of skin: Secondary | ICD-10-CM | POA: Insufficient documentation

## 2015-06-16 DIAGNOSIS — I6980 Unspecified sequelae of other cerebrovascular disease: Secondary | ICD-10-CM | POA: Diagnosis not present

## 2015-06-16 DIAGNOSIS — M792 Neuralgia and neuritis, unspecified: Secondary | ICD-10-CM

## 2015-06-16 DIAGNOSIS — R279 Unspecified lack of coordination: Secondary | ICD-10-CM | POA: Insufficient documentation

## 2015-06-16 DIAGNOSIS — IMO0002 Reserved for concepts with insufficient information to code with codable children: Secondary | ICD-10-CM

## 2015-06-16 DIAGNOSIS — G8194 Hemiplegia, unspecified affecting left nondominant side: Secondary | ICD-10-CM | POA: Diagnosis present

## 2015-06-16 DIAGNOSIS — M6281 Muscle weakness (generalized): Secondary | ICD-10-CM | POA: Diagnosis present

## 2015-06-16 LAB — PROTHROMBIN GENE MUTATION

## 2015-06-16 LAB — ANTINUCLEAR ANTIBODIES, IFA: ANTINUCLEAR ANTIBODIES, IFA: NEGATIVE

## 2015-06-16 LAB — FACTOR 5 LEIDEN

## 2015-06-16 NOTE — Telephone Encounter (Signed)
New Message  Pt calling to speak w/ device concerning [employer]tronik device monitor. Please call back and discuss.

## 2015-06-16 NOTE — Therapy (Signed)
[facility] [address] [place], [place], [address] Phone: [phone]   Fax:  [phone]  Occupational Therapy Evaluation  Patient Details  Name: Tina Sharp MRN: [id number] Date of Birth: February 24, 1966 Referring Provider: Dr. Leonie Man  Encounter Date: 06/16/2015      OT End of Session - 06/16/15 0947    Visit Number 1   Number of Visits 16   Date for OT Re-Evaluation 08/11/15   Authorization Type BCBS - awaiting clarification of specifics of plan   OT Start Time 0845   OT Stop Time 0925   OT Time Calculation (min) 40 min   Activity Tolerance Patient tolerated treatment well   Behavior During Therapy [facility] for tasks assessed/performed      Past Medical History  Diagnosis Date  . GERD (gastroesophageal reflux disease)   . 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 ER/ PR+; Her 2 neu not overexpressed, microscopic positive margin (skin).    Past Surgical History  Procedure Laterality Date  . Laparoscopic hysterectomy  2010    [facility]  . Breast surgery Left 04/08/14    mastectomy  . Reduction mammoplasty Right 04/08/14    Dr Tula Nakayama  . Colonoscopy    . Ep implantable device N/A 06/14/2015    Procedure: Loop Recorder Insertion;  Surgeon: Evans Lance, MD;  Location: [place] [facility];  Service: Cardiovascular;  Laterality: N/A;  . Tee without cardioversion N/A 06/14/2015    Procedure: TRANSESOPHAGEAL ECHOCARDIOGRAM (TEE);  Surgeon: Josue Hector, MD;  Location: [facility] ENDOSCOPY;  Service: Cardiovascular;  Laterality: N/A;    There were no vitals filed for this visit.  Visit Diagnosis:  Lack of coordination due to stroke - Plan: Ot plan of care cert/re-cert  Impaired sensation - Plan: Ot plan of care cert/re-cert  Hemiplegia affecting left nondominant side ([place]) - Plan: Ot plan of care cert/re-cert  Muscle weakness - Plan: Ot plan of care  cert/re-cert  Neuropathic pain - Plan: Ot plan of care cert/re-cert      Subjective Assessment - 06/16/15 0850    Subjective  When I do too much with my L arm it will tingle and that doesn't feel good.   Pertinent History see epic. 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 No/denies  states weird sensory feeling in L arm           [facility] OT Assessment - 06/16/15 0001    Assessment   Diagnosis R CVA   Referring Provider Dr. Leonie Man   Onset Date 06/11/15   Prior Therapy acute care OT and PT   Precautions   Precautions None   Restrictions   Weight Bearing Restrictions No   Balance Screen   Has the patient fallen in the past 6 months No   Home  Environment   Family/patient expects to be discharged to: Private residence   Living Arrangements Spouse/significant other  89 year old dtr   Available Help at Discharge Available PRN/intermittently   Type of [place] One level   Bathroom Shower/Tub Haematologist   Additional Comments Pt has loop recorder   Prior Function   Level of Independence Independent   Vocation Full time employment   Geneticist, molecular and types  a great deal   ADL   Eating/Feeding Minimal assistance  for cutting   Grooming Independent   Upper Body Bathing Independent   Lower Body Bathing Independent   Upper Body Dressing Minimal assistance  needs assist with tying, buttons and zipping   Lower Body Dressing Minimal assistance  tying, buttoning and zipping   Toilet Tranfer Independent   Toileting - Clothing Manipulation Modified independent   [place] Transfer Independent   ADL comments Pt has no equipment and states her balance is fine - pt feels stroke only affected her LUE   IADL   Shopping Needs to be accompanied on any shopping trip   Light  Housekeeping Does not participate in any housekeeping tasks   Meal Prep Needs to have meals prepared and served   [employer] on family or friends for transportation   Medication Management Is responsible for taking medication in correct dosages at correct time   Financial Management --  N/A - husband did this before   Mobility   Mobility Status Independent   Mobility Status Comments Pt is not cleared to drive   Written Expression   Dominant Hand Right   Vision - History   Baseline Vision Wears glasses only for reading  and computer work   Vision Assessment   Comment Pt reports no visual changes since stroke   Activity Tolerance   Activity Tolerance Tolerate 30+ min activity without fatigue   Activity Tolerance Comments Pt reports increased fatigue since stroke   Cognition   Overall Cognitive Status Within Functional Limits for tasks assessed   Sensation   Light Touch Impaired by gross assessment   Hot/Cold Impaired by gross assessment   Proprioception Impaired by gross assessment  for smaller movements   Coordination   Gross Motor Movements are Fluid and Coordinated Yes   Fine Motor Movements are Fluid and Coordinated No   9 Hole Peg Test Right;Left   Right 9 Hole Peg Test 20.23   Left 9 Hole Peg Test 54.48   Box and Blocks L=31   Coordination Pt with difficulty orienting hand to task and uses very awkward grasp to pick up objects.    Tone   Assessment Location Left Upper Extremity   ROM / Strength   AROM / PROM / Strength AROM;Strength   AROM   Overall AROM  Within functional limits for tasks performed   Strength   Overall Strength Deficits   Overall Strength Comments Pt with 4/5 proximal strength in LUE however states she had mastectomy last year with resultant weakness - pt feels this is baseline and only notes weakness in hand   Hand Function   Right Hand Gross Grasp Functional   Right Hand Grip (lbs) 55   Left Hand Gross Grasp Impaired   Left Hand  Grip (lbs) 15   LUE Tone   LUE Tone Within Functional Limits                           OT Short Term Goals - 06/16/15 0931    OT SHORT TERM GOAL #1   Title Pt will be mod I with HEP - 07/14/2015   Status New   OT SHORT TERM GOAL #2   Title Pt will be able to button and zipper clothing with increased time.   Status New   OT SHORT TERM GOAL #3   Title Pt will demonstrate improved grip  strength by at least 5 pounds to assist with functional tasks (baseline=15 pounds)   Status New   OT SHORT TERM GOAL #4   Title Pt will demonstrate improved fine motor coordination as evidenced by decreasing time on 9 hole peg test by at least 8 seconds (baseline= 54.48)   Status New   OT SHORT TERM GOAL #5   Title Pt will demonstrate ability to pick up small objects with mod compensations, min dropping and increased time.    Status New   Additional Short Term Goals   Additional Short Term Goals Yes   OT SHORT TERM GOAL #6   Title Pt will require no more than min vc's to use vision to assist in compensation of use of L hand during functional tasks.    Status New           OT Long Term Goals - 06/16/15 0936    OT LONG TERM GOAL #1   Title Pt will be mod I with upgraded HEP - 12.29/2016   Status New   OT LONG TERM GOAL #2   Title Pt will demonstrate increased grip strength by at least 10 pounds to assist with functional tasks (baseline= 15 pounds)   Status New   OT LONG TERM GOAL #3   Title Pt will demonstrate improved coordinaton as evidenced by decreasing time on 9 hole peg test by at least 15 seconds (baseline= 54.48)   Status New   OT LONG TERM GOAL #4   Title Pt will be I with tying shoes using both hands   Status New   OT LONG TERM GOAL #5   Title Pt will be able to use LUE as non dominant assist for basic self care at least 75% of the time   Status New   Long Term Additional Goals   Additional Long Term Goals Yes   OT LONG TERM GOAL #6   Title Pt will be able to  orient hand approrpriately and manipulate small objects in left hand during functional tasks with min compensations.    Status New   OT LONG TERM GOAL #7   Title Pt will be able to complete bilateral tasks with min compensations   Status New   OT LONG TERM GOAL #8   Title Pt will be able to type 4 sentence paragrah with at least 75% accuracy using both hands   Status New               Plan - 06/16/15 0941    Clinical Impression Statement Pt is a 49 year old female s/p R CVA on 06/11/2015. Pt presents today with the following deficits which impact her ability to use her L hand functionally:  L non dominant hemiplegia, impaired sensation, intermittent neuropathic discomfort, impaired strength in hand, impaired coordinaton, impaired functional use of the L hand.  Pt will benefit from skilled OT to maximize recovery of these deficits to improve functional use of the L hand and increase independence.    Pt will benefit from skilled therapeutic intervention in order to improve on the following deficits (Retired) Decreased activity tolerance;Decreased coordination;Decreased knowledge of use of DME;Decreased strength;Impaired UE functional use;Impaired sensation;Pain   Rehab Potential Good   OT Frequency 2x / week   OT Duration 8 weeks   OT Treatment/Interventions Self-care/ADL training;Moist Heat;Electrical Stimulation;Fluidtherapy;DME and/or AE instruction;Neuromuscular education;Therapeutic exercise;Manual Therapy;Splinting;Therapeutic activities;Patient/family education   Plan Intiate HEP for strength - pt to bring yellow theraputty and current program   Consulted  and Agree with Plan of Care Patient        Problem List Patient Active Problem List   Diagnosis Date Noted  . Headache 06/14/2015  . Cigarette smoker 06/14/2015  . 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 Malignant neoplasm of breast (female), unspecified site 04/23/2014  . Breast  cancer ([place]) 02/20/2014    Quay Burow, OTR/L 06/16/2015, 9:52 AM  [facility] [address] [place], [place], [address] Phone: [phone]   Fax:  [phone]  Name: Tina Sharp MRN: [id number] Date of Birth: 02-08-1966

## 2015-06-16 NOTE — Telephone Encounter (Signed)
LMOVM for pt to return call. Left my direct number.  

## 2015-06-16 NOTE — Telephone Encounter (Signed)
Patient called to advise of procedure she had done to place "readers under shoulder blades, has a [employer] unit that sits beside her bed and wonders if she can take a shower". Patient advised to contact Dr. Hillery Jacks office for instructions regarding this device. Phone number for Dr. Lovena Le given to patient [phone])

## 2015-06-16 NOTE — Telephone Encounter (Signed)
Daughter called back regarding FMLA paperwork that needs to be filled out for Mom. I advised that we don't fill paperwork out until we first see in the office, we can do paperwork at 07/06/15 hospital follow up appointment. I advised that paperwork is usually done at hospital. Daughter said that paperwork was given at the hospital, Mom got moved to another room and staff lost the paperwork. Hospital staff advised patient to call us.

## 2015-06-16 NOTE — Telephone Encounter (Signed)
Informed pt that device clinic RN stated that it is ok that she take a shower. Remove large bandage, leave steri strips, pat dry, and not lotions, ointments, or powders. Pt verbalized understanding.

## 2015-06-16 NOTE — Telephone Encounter (Signed)
Phone call from [facility] at [facility] 2:58pm advising that patient is on the phone and is frustrated, feels like she is getting the runaround regarding FMLA paperwork. I advised Ike Bene of what we told patient (paperwork not done prior to 1st being seen in the office, paperwork is usually done at the hospital. Patient submitted FMLA paperwork while in hospital, patient was transferred to another room at the hospital and hospital staff lost the paperwork, patient was advised to call us). Ike Bene will check with Ivin Booty to see how to handle this.

## 2015-06-16 NOTE — Telephone Encounter (Signed)
Rn receive incoming call from patients daughter Tina Sharp about her moms paperwork. Tina Sharp stated her mom was in the ICU at the hospital. While she was in the hospital the [place] lost the [facility] paperwork. Pts daughter stated her mom will be seeing her PCP next week. Rn explain that the paperwork can be done on 07-06-15 when she sees Dr.Sethi for hospital follow up. PT was seen by [facility] in hospital but not in outpatient. Rn stated there is a neuroscience department that can help with FMLA paperwork on the 3rd floor. Rn also advised daughter that she can show her moms hospital discharge summary to her job. Daughter will call back once she finds out.

## 2015-06-20 ENCOUNTER — Inpatient Hospital Stay: Payer: BLUE CROSS/BLUE SHIELD | Attending: Internal Medicine | Admitting: Internal Medicine

## 2015-06-20 ENCOUNTER — Encounter: Payer: Self-pay | Admitting: Internal Medicine

## 2015-06-20 VITALS — BP 135/93 | HR 94 | Temp 99.2°F | Resp 18 | Ht 62.0 in | Wt 142.4 lb

## 2015-06-20 DIAGNOSIS — K219 Gastro-esophageal reflux disease without esophagitis: Secondary | ICD-10-CM | POA: Insufficient documentation

## 2015-06-20 DIAGNOSIS — Z170 Estrogen receptor positive status [ER+]: Secondary | ICD-10-CM | POA: Insufficient documentation

## 2015-06-20 DIAGNOSIS — I10 Essential (primary) hypertension: Secondary | ICD-10-CM | POA: Diagnosis not present

## 2015-06-20 DIAGNOSIS — F17210 Nicotine dependence, cigarettes, uncomplicated: Secondary | ICD-10-CM | POA: Insufficient documentation

## 2015-06-20 DIAGNOSIS — F329 Major depressive disorder, single episode, unspecified: Secondary | ICD-10-CM | POA: Insufficient documentation

## 2015-06-20 DIAGNOSIS — C50912 Malignant neoplasm of unspecified site of left female breast: Secondary | ICD-10-CM | POA: Insufficient documentation

## 2015-06-20 DIAGNOSIS — Z7982 Long term (current) use of aspirin: Secondary | ICD-10-CM | POA: Insufficient documentation

## 2015-06-20 DIAGNOSIS — D7589 Other specified diseases of blood and blood-forming organs: Secondary | ICD-10-CM | POA: Insufficient documentation

## 2015-06-20 DIAGNOSIS — Z79811 Long term (current) use of aromatase inhibitors: Secondary | ICD-10-CM | POA: Diagnosis not present

## 2015-06-20 DIAGNOSIS — Z8673 Personal history of transient ischemic attack (TIA), and cerebral infarction without residual deficits: Secondary | ICD-10-CM | POA: Diagnosis not present

## 2015-06-20 DIAGNOSIS — F419 Anxiety disorder, unspecified: Secondary | ICD-10-CM | POA: Diagnosis not present

## 2015-06-20 DIAGNOSIS — D539 Nutritional anemia, unspecified: Secondary | ICD-10-CM | POA: Insufficient documentation

## 2015-06-20 NOTE — Progress Notes (Signed)
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 etiology  # BRCA- 1&2- NEG; s/p TAH; ovaries intact [2000]; Post menopausal [Oct 2015- Estradiol <5; FSH-108; LH-42]   INTERVAL HISTORY:  A very pleasant 49 year old female patient who is postmenopausal with stage II breast cancer currently on adjuvant anastrozole is here for follow-up.  In the interim patient was admitted to the hospital in [facility] for a stroke; patient is currently on aspirin 325 mg a day. She also has recorder in place. She unfortunately continues to smoke. Patient has mild weakness in the left upper extremity/undergoing physical therapy.  REVIEW OF SYSTEMS:  A complete 10 point review of system is done which is negative except mentioned above/history of present illness.   PAST MEDICAL HISTORY :  Past Medical History  Diagnosis Date  . GERD (gastroesophageal reflux disease)   . Cancer [facility]) June 2015    Invasive lobular carcinoma, 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 ER/ PR+; Her 2 neu not overexpressed, microscopic positive margin (skin).  . Hypertension   . Depression   . Anxiety     PAST SURGICAL HISTORY :   Past Surgical History  Procedure Laterality Date  . Laparoscopic hysterectomy  2010    [facility]  . Breast surgery Left 04/08/14    mastectomy  . Reduction mammoplasty Right 04/08/14    Dr Tula Nakayama  . Colonoscopy    . Ep implantable device N/A 06/14/2015    Procedure: Loop Recorder Insertion;  Surgeon: Evans Lance, MD;  Location: [facility] LAB;  Service: Cardiovascular;  Laterality: N/A;  . Tee without cardioversion N/A 06/14/2015    Procedure: TRANSESOPHAGEAL ECHOCARDIOGRAM (TEE);  Surgeon: Josue Hector, MD;  Location: [facility] ENDOSCOPY;  Service: Cardiovascular;  Laterality: N/A;  . Abdominal hysterectomy  2000    left both ovaries in  . Tonsillectomy and adenoidectomy      pt was 49 years old    FAMILY HISTORY :   Family History  Problem Relation Age of Onset  . Breast cancer Mother   . Breast cancer Maternal Grandmother   . Cancer Maternal Grandfather 89    prostate  . Ovarian cancer Other     SOCIAL HISTORY:   Social History  Substance Use Topics  . Smoking status: Current Every Day Smoker -- 0.25 packs/day  . Smokeless tobacco: Never Used     Comment: E cig  . Alcohol Use: 0.0 oz/week    0 Standard drinks or equivalent per week     Comment: social    ALLERGIES:  has No Known Allergies.  MEDICATIONS:  Current Outpatient Prescriptions  Medication Sig Dispense Refill  . anastrozole (ARIMIDEX) 1 MG tablet Take 1 tablet (1 mg total) by mouth every evening. 7 tablet 0  . aspirin EC 325 MG EC tablet Take 1 tablet (325 mg total) by mouth daily. 30 tablet 0  . esomeprazole (NEXIUM) 20 MG capsule Take 20 mg by mouth daily as needed (for heartburn).     . Hypromellose (ARTIFICIAL TEARS OP) Apply 2 drops  to eye 2 (two) times daily as needed (for dry eyes).    . prochlorperazine (COMPAZINE) 5 MG tablet Take 5 mg by mouth every 6 (six) hours as needed for nausea or vomiting.    . traMADol (ULTRAM) 50 MG tablet Take 1 tablet (50 mg total) by mouth every 6 (six) hours as needed for moderate pain or severe pain. 15 tablet 0   No current facility-administered medications for this visit.    PHYSICAL EXAMINATION: ECOG PERFORMANCE STATUS: 0 - Asymptomatic  BP 135/93 mmHg  Pulse 94  Temp(Src) 99.2 F (37.3 C) (Tympanic)  Resp 18  Ht '5\' 2"'  (1.575 m)  Wt 142 lb 6.7 oz (64.6 kg)  BMI  26.04 kg/m2  Filed Weights   06/20/15 0931  Weight: 142 lb 6.7 oz (64.6 kg)    GENERAL: Well-nourished well-developed; Alert, no distress and comfortable.   Accompanied by her mother. EYES: no pallor or icterus OROPHARYNX: no thrush or ulceration; good dentition  NECK: supple, no masses felt LYMPH:  no palpable lymphadenopathy in the cervical, axillary or inguinal regions LUNGS: clear to auscultation and  No wheeze or crackles HEART/CVS: regular rate & rhythm and no murmurs; No lower extremity edema ABDOMEN:abdomen soft, non-tender and normal bowel sounds Musculoskeletal:no cyanosis of digits and no clubbing  PSYCH: alert & oriented x 3 with fluent speech NEURO: Mild focal weakness noted in the left upper extremity especially with grasp SKIN:  no rashes or significant lesions Right and left BREAST exam [in the presence of nurse]- no unusual skin changes or dominant masses felt. Surgical scars noted.   LABORATORY DATA:  I have reviewed the data as listed    Component Value Date/Time   NA 140 06/13/2015 0250   NA 144 02/17/2014 1006   K 4.5 06/13/2015 0250   CL 111 06/13/2015 0250   CO2 25 06/13/2015 0250   GLUCOSE 89 06/13/2015 0250   GLUCOSE 86 02/17/2014 1006   BUN <5* 06/13/2015 0250   BUN 6 02/17/2014 1006   CREATININE 0.67 06/13/2015 0250   CREATININE 0.86 05/21/2014 1546   CALCIUM 8.1* 06/13/2015 0250   PROT 4.5* 06/13/2015 0250   PROT 6.6 05/21/2014 1546   PROT 6.3 02/17/2014 1006   ALBUMIN 2.5* 06/13/2015 0250   ALBUMIN 3.8 05/21/2014 1546   ALBUMIN 4.3 02/17/2014 1006   AST 25 06/13/2015 0250   AST 125* 05/21/2014 1546   ALT 16 06/13/2015 0250   ALT 76* 05/21/2014 1546   ALKPHOS 60 06/13/2015 0250   ALKPHOS 109 05/21/2014 1546   BILITOT 0.5 06/13/2015 0250   BILITOT 0.3 05/21/2014 1546   GFRNONAA >60 06/13/2015 0250   GFRNONAA >60 05/21/2014 1546   GFRAA >60 06/13/2015 0250   GFRAA >60 05/21/2014 1546    No results found for: SPEP, UPEP  Lab Results   Component Value Date   WBC 6.8 06/13/2015   NEUTROABS 6.5 06/11/2015   HGB 11.9* 06/13/2015   HCT 36.7 06/13/2015   MCV 116.2* 06/13/2015   PLT 237 06/13/2015      Chemistry      Component Value Date/Time   NA 140 06/13/2015 0250   NA 144 02/17/2014 1006   K 4.5 06/13/2015 0250   CL 111 06/13/2015 0250   CO2 25 06/13/2015 0250   BUN <5* 06/13/2015 0250   BUN 6 02/17/2014 1006   CREATININE 0.67 06/13/2015 0250   CREATININE 0.86 05/21/2014 1546      Component Value Date/Time   CALCIUM 8.1* 06/13/2015 0250  ALKPHOS 60 06/13/2015 0250   ALKPHOS 109 05/21/2014 1546   AST 25 06/13/2015 0250   AST 125* 05/21/2014 1546   ALT 16 06/13/2015 0250   ALT 76* 05/21/2014 1546   BILITOT 0.5 06/13/2015 0250   BILITOT 0.3 05/21/2014 1546       RADIOGRAPHIC STUDIES: I have personally reviewed the radiological images as listed and agreed with the findings in the report. No results found.   ASSESSMENT & PLAN:   # Stage II lobular cancer- on adjuvant anastrozole. Patient tolerating anastrozole fairly well [except stroke see discussion below]. Clinically no concerns for recurrence. Patient due for mammogram in the next few weeks.  # Recent stroke October 2016 with slight left upper extremity weakness. I discussed the risk of stroke with aromatase inhibitors; however clearly the benefits of aromatase inhibitor outweighs the risk. I think her risk factor for stroke is her active smoking.  # Discussed regarding quitting smoking.  # Macrocytosis of 116 with a close normal hemoglobin of 11.9. Unclear etiology. Patient denies any alcohol abuse. We will check labs   at next visit.  No orders of the defined types were placed in this encounter.   All questions were answered. The patient knows to call the clinic with any problems, questions or concerns. No barriers to learning was detected. I spent 25 minutes counseling the patient face to face. The total time spent in the appointment was 30  minutes and more than 50% was on counseling and review of test results     Cammie Sickle, MD 06/20/2015 9:43 AM

## 2015-06-20 NOTE — Progress Notes (Signed)
Chaperoned provider with Breast Exam 

## 2015-06-21 ENCOUNTER — Observation Stay (HOSPITAL_COMMUNITY)
Admission: EM | Admit: 2015-06-21 | Discharge: 2015-06-22 | Disposition: A | Discharge status: Home / Self Care | Payer: BLUE CROSS/BLUE SHIELD | Attending: Internal Medicine | Admitting: Internal Medicine

## 2015-06-21 ENCOUNTER — Observation Stay (HOSPITAL_COMMUNITY): Payer: BLUE CROSS/BLUE SHIELD

## 2015-06-21 ENCOUNTER — Emergency Department (HOSPITAL_COMMUNITY): Payer: BLUE CROSS/BLUE SHIELD

## 2015-06-21 ENCOUNTER — Encounter: Payer: Self-pay | Admitting: Internal Medicine

## 2015-06-21 ENCOUNTER — Encounter (HOSPITAL_COMMUNITY): Payer: Self-pay | Admitting: General Practice

## 2015-06-21 ENCOUNTER — Ambulatory Visit (INDEPENDENT_AMBULATORY_CARE_PROVIDER_SITE_OTHER): Payer: BLUE CROSS/BLUE SHIELD | Admitting: *Deleted

## 2015-06-21 DIAGNOSIS — K219 Gastro-esophageal reflux disease without esophagitis: Secondary | ICD-10-CM | POA: Insufficient documentation

## 2015-06-21 DIAGNOSIS — C50912 Malignant neoplasm of unspecified site of left female breast: Secondary | ICD-10-CM | POA: Diagnosis not present

## 2015-06-21 DIAGNOSIS — I69328 Other speech and language deficits following cerebral infarction: Principal | ICD-10-CM | POA: Insufficient documentation

## 2015-06-21 DIAGNOSIS — Z720 Tobacco use: Secondary | ICD-10-CM | POA: Diagnosis not present

## 2015-06-21 DIAGNOSIS — R471 Dysarthria and anarthria: Secondary | ICD-10-CM | POA: Diagnosis not present

## 2015-06-21 DIAGNOSIS — D7589 Other specified diseases of blood and blood-forming organs: Secondary | ICD-10-CM | POA: Insufficient documentation

## 2015-06-21 DIAGNOSIS — Z9012 Acquired absence of left breast and nipple: Secondary | ICD-10-CM | POA: Diagnosis not present

## 2015-06-21 DIAGNOSIS — G459 Transient cerebral ischemic attack, unspecified: Secondary | ICD-10-CM | POA: Diagnosis present

## 2015-06-21 DIAGNOSIS — Z8673 Personal history of transient ischemic attack (TIA), and cerebral infarction without residual deficits: Secondary | ICD-10-CM | POA: Insufficient documentation

## 2015-06-21 DIAGNOSIS — I10 Essential (primary) hypertension: Secondary | ICD-10-CM | POA: Insufficient documentation

## 2015-06-21 DIAGNOSIS — I639 Cerebral infarction, unspecified: Secondary | ICD-10-CM | POA: Diagnosis not present

## 2015-06-21 DIAGNOSIS — I63339 Cerebral infarction due to thrombosis of unspecified posterior cerebral artery: Secondary | ICD-10-CM

## 2015-06-21 DIAGNOSIS — Z7982 Long term (current) use of aspirin: Secondary | ICD-10-CM | POA: Insufficient documentation

## 2015-06-21 DIAGNOSIS — I69322 Dysarthria following cerebral infarction: Secondary | ICD-10-CM | POA: Diagnosis not present

## 2015-06-21 DIAGNOSIS — I6521 Occlusion and stenosis of right carotid artery: Secondary | ICD-10-CM | POA: Diagnosis not present

## 2015-06-21 DIAGNOSIS — I69392 Facial weakness following cerebral infarction: Secondary | ICD-10-CM | POA: Insufficient documentation

## 2015-06-21 DIAGNOSIS — Z853 Personal history of malignant neoplasm of breast: Secondary | ICD-10-CM | POA: Diagnosis not present

## 2015-06-21 DIAGNOSIS — I69354 Hemiplegia and hemiparesis following cerebral infarction affecting left non-dominant side: Secondary | ICD-10-CM | POA: Insufficient documentation

## 2015-06-21 DIAGNOSIS — F17210 Nicotine dependence, cigarettes, uncomplicated: Secondary | ICD-10-CM | POA: Insufficient documentation

## 2015-06-21 DIAGNOSIS — H539 Unspecified visual disturbance: Secondary | ICD-10-CM | POA: Diagnosis not present

## 2015-06-21 DIAGNOSIS — I69398 Other sequelae of cerebral infarction: Secondary | ICD-10-CM | POA: Diagnosis not present

## 2015-06-21 DIAGNOSIS — C50919 Malignant neoplasm of unspecified site of unspecified female breast: Secondary | ICD-10-CM | POA: Diagnosis present

## 2015-06-21 LAB — COMPREHENSIVE METABOLIC PANEL
ALK PHOS: 92 U/L (ref 38–126)
ALT: 26 U/L (ref 14–54)
ANION GAP: 11 (ref 5–15)
AST: 47 U/L — ABNORMAL HIGH (ref 15–41)
Albumin: 3.2 g/dL — ABNORMAL LOW (ref 3.5–5.0)
BILIRUBIN TOTAL: 1.1 mg/dL (ref 0.3–1.2)
BUN: <5 mg/dL — ABNORMAL LOW (ref 6–20)
CALCIUM: 8.8 mg/dL — AB (ref 8.9–10.3)
CO2: 26 mmol/L (ref 22–32)
Chloride: 102 mmol/L (ref 101–111)
Creatinine, Ser: 0.92 mg/dL (ref 0.44–1.00)
Glucose, Bld: 102 mg/dL — ABNORMAL HIGH (ref 65–99)
Potassium: 4.1 mmol/L (ref 3.5–5.1)
SODIUM: 139 mmol/L (ref 135–145)
TOTAL PROTEIN: 5.7 g/dL — AB (ref 6.5–8.1)

## 2015-06-21 LAB — DIFFERENTIAL
BASOS ABS: 0 10*3/uL (ref 0.0–0.1)
BASOS PCT: 0 %
EOS PCT: 1 %
Eosinophils Absolute: 0.1 10*3/uL (ref 0.0–0.7)
LYMPHS ABS: 1.8 10*3/uL (ref 0.7–4.0)
LYMPHS PCT: 17 %
Monocytes Absolute: 0.8 10*3/uL (ref 0.1–1.0)
Monocytes Relative: 7 %
NEUTROS ABS: 8.1 10*3/uL — AB (ref 1.7–7.7)
NEUTROS PCT: 75 %

## 2015-06-21 LAB — CUP PACEART INCLINIC DEVICE CHECK: Date Time Interrogation Session: 20161108083536

## 2015-06-21 LAB — URINALYSIS, ROUTINE W REFLEX MICROSCOPIC
Glucose, UA: NEGATIVE mg/dL
Hgb urine dipstick: NEGATIVE
Ketones, ur: 15 mg/dL — AB
LEUKOCYTES UA: NEGATIVE
NITRITE: NEGATIVE
PROTEIN: NEGATIVE mg/dL
SPECIFIC GRAVITY, URINE: 1.017 (ref 1.005–1.030)
UROBILINOGEN UA: 1 mg/dL (ref 0.0–1.0)
pH: 5.5 (ref 5.0–8.0)

## 2015-06-21 LAB — I-STAT CHEM 8, ED
BUN: <3 mg/dL — ABNORMAL LOW (ref 6–20)
CHLORIDE: 99 mmol/L — AB (ref 101–111)
CREATININE: 0.8 mg/dL (ref 0.44–1.00)
Calcium, Ion: 1.04 mmol/L — ABNORMAL LOW (ref 1.12–1.23)
GLUCOSE: 97 mg/dL (ref 65–99)
HEMATOCRIT: 52 % — AB (ref 36.0–46.0)
HEMOGLOBIN: 17.7 g/dL — AB (ref 12.0–15.0)
POTASSIUM: 4.1 mmol/L (ref 3.5–5.1)
Sodium: 140 mmol/L (ref 135–145)
TCO2: 27 mmol/L (ref 0–100)

## 2015-06-21 LAB — APTT: APTT: 24 s (ref 24–37)

## 2015-06-21 LAB — CBC
HEMATOCRIT: 46.6 % — AB (ref 36.0–46.0)
HEMOGLOBIN: 15.8 g/dL — AB (ref 12.0–15.0)
MCH: 38.2 pg — ABNORMAL HIGH (ref 26.0–34.0)
MCHC: 33.9 g/dL (ref 30.0–36.0)
MCV: 112.6 fL — ABNORMAL HIGH (ref 78.0–100.0)
Platelets: 376 10*3/uL (ref 150–400)
RBC: 4.14 MIL/uL (ref 3.87–5.11)
RDW: 16.1 % — AB (ref 11.5–15.5)
WBC: 10.8 10*3/uL — AB (ref 4.0–10.5)

## 2015-06-21 LAB — PROTIME-INR
INR: 1.04 (ref 0.00–1.49)
Prothrombin Time: 13.8 seconds (ref 11.6–15.2)

## 2015-06-21 LAB — I-STAT TROPONIN, ED: TROPONIN I, POC: 0 ng/mL (ref 0.00–0.08)

## 2015-06-21 LAB — RAPID URINE DRUG SCREEN, HOSP PERFORMED
Amphetamines: NOT DETECTED
Barbiturates: NOT DETECTED
Benzodiazepines: NOT DETECTED
Cocaine: NOT DETECTED
OPIATES: NOT DETECTED
Tetrahydrocannabinol: NOT DETECTED

## 2015-06-21 LAB — ETHANOL: Alcohol, Ethyl (B): <5 mg/dL (ref <5)

## 2015-06-21 MED ORDER — TRAMADOL HCL 50 MG PO TABS
25.0000 mg | ORAL_TABLET | Freq: Four times a day (QID) | ORAL | Status: DC | PRN
  Administered 2015-06-21 – 2015-06-22 (×3): 25 mg via ORAL
  Filled 2015-06-21 (×3): qty 1

## 2015-06-21 MED ORDER — ASPIRIN EC 325 MG PO TBEC
325.0000 mg | DELAYED_RELEASE_TABLET | Freq: Every day | ORAL | Status: DC
  Administered 2015-06-21 – 2015-06-22 (×2): 325 mg via ORAL
  Filled 2015-06-21 (×2): qty 1

## 2015-06-21 MED ORDER — ENOXAPARIN SODIUM 40 MG/0.4ML ~~LOC~~ SOLN
40.0000 mg | SUBCUTANEOUS | Status: DC
  Administered 2015-06-21: 40 mg via SUBCUTANEOUS
  Filled 2015-06-21: qty 0.4

## 2015-06-21 MED ORDER — FENTANYL CITRATE (PF) 100 MCG/2ML IJ SOLN
100.0000 ug | Freq: Once | INTRAMUSCULAR | Status: AC
  Administered 2015-06-21: 100 ug via INTRAVENOUS
  Filled 2015-06-21: qty 2

## 2015-06-21 MED ORDER — PANTOPRAZOLE SODIUM 40 MG PO TBEC
40.0000 mg | DELAYED_RELEASE_TABLET | Freq: Every day | ORAL | Status: DC
  Administered 2015-06-22: 40 mg via ORAL
  Filled 2015-06-21: qty 1

## 2015-06-21 MED ORDER — SODIUM CHLORIDE 0.9 % IV SOLN
INTRAVENOUS | Status: AC
  Administered 2015-06-21: 1000 mL via INTRAVENOUS

## 2015-06-21 MED ORDER — SENNOSIDES-DOCUSATE SODIUM 8.6-50 MG PO TABS: 1.0000 | ORAL_TABLET | Freq: Every evening | ORAL | Status: DC | PRN

## 2015-06-21 MED ORDER — ONDANSETRON HCL 4 MG/2ML IJ SOLN
4.0000 mg | Freq: Once | INTRAMUSCULAR | Status: AC
  Administered 2015-06-21: 4 mg via INTRAVENOUS
  Filled 2015-06-21: qty 2

## 2015-06-21 MED ORDER — PROCHLORPERAZINE MALEATE 5 MG PO TABS
5.0000 mg | ORAL_TABLET | Freq: Four times a day (QID) | ORAL | Status: DC | PRN
  Filled 2015-06-21: qty 1

## 2015-06-21 MED ORDER — ANASTROZOLE 1 MG PO TABS
1.0000 mg | ORAL_TABLET | Freq: Every evening | ORAL | Status: DC
  Administered 2015-06-21: 1 mg via ORAL
  Filled 2015-06-21 (×2): qty 1

## 2015-06-21 MED ORDER — STROKE: EARLY STAGES OF RECOVERY BOOK
Freq: Once | Status: AC
  Administered 2015-06-21: 1

## 2015-06-21 MED ORDER — ARTIFICIAL TEARS OP OINT
TOPICAL_OINTMENT | Freq: Every evening | OPHTHALMIC | Status: DC | PRN
  Filled 2015-06-21: qty 3.5

## 2015-06-21 NOTE — ED Notes (Signed)
Attempted report x1. 

## 2015-06-21 NOTE — Consult Note (Signed)
Referring Physician: ED MD    Chief Complaint: Code stroke  HPI:                                                                                                                                         Tina Sharp is an 49 y.o. female with a past medical history as delineated below, recently admitted to hospital for stroke and received tPA.  At that time no source was found and stroke was thought to be cryptogenic.  Symptoms at that time were slurred speech, left facial droop and left arm weakness. Today at [place] patient felt her symptoms worsened. [facility] code stroke. On arrival she had dysarthria. Left facial droop and left arm inattention.  Mild HA but denies vertigo, double vision, difficulty swallowing, confusion, or vision impairment.  Previous work up.   Mr Brain Wo Contrast 06/12/2015 Multifocal areas of nonhemorrhagic acute infarction throughout the RIGHT hemisphere, predominantly cortical involvement. 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 Multiple emboli are less favored. No evidence for post tPA complication.   Dg Chest Portable 1 View 06/11/2015 No radiographic evidence of acute cardiopulmonary abnormality.   Mr Jodene Nam Head/brain Wo Cm 06/12/2015 No proximal flow reducing lesion is observed on MRA, but there is subtle irregularity and beading of the distal RIGHT MCA branches as compared to the LEFT.   2D Echocardiogram  - Left ventricle: The cavity size was normal. Wall thickness wasincreased in a pattern of mild LVH. Systolic function was normal.The estimated ejection fraction was in the range of 60% to 65%.Wall motion was normal; there were no regional wall motionabnormalities. Doppler parameters are consistent with abnormalleft ventricular relaxation (grade 1 diastolic dysfunction).  Carotid Doppler  Findings suggest low  range 40-59% right internal carotid artery stenosis and 1-39% left internal carotid artery stenosis. Vertebral arteries are patent with antegrade flow.  TEE Normal TEE. Trivial MR. No SOE. Negative bubble study. No aortic debris. No LAA thrombus. EF 60%   Date last known well: Date: 06/21/2015 Time last known well: Time: 09:10 am tPA Given: No: recent stroke NIHSS: 3    Past Medical History  Diagnosis Date  . GERD (gastroesophageal reflux disease)   . Cancer [facility]) June 2015    Invasive lobular carcinoma, 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 ER/ PR+; Her 2 neu not overexpressed, microscopic positive margin (skin).  . Hypertension   . Depression   . Anxiety   . 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 Laparoscopic hysterectomy  2010    [facility]  . Breast surgery Left 04/08/14    mastectomy  . Reduction mammoplasty Right 04/08/14    Dr Tula Nakayama  . Colonoscopy    .  Ep implantable device N/A 06/14/2015    Procedure: Loop Recorder Insertion;  Surgeon: Evans Lance, MD;  Location: [place] [facility] LAB;  Service: Cardiovascular;  Laterality: N/A;  . Tee without cardioversion N/A 06/14/2015    Procedure: TRANSESOPHAGEAL ECHOCARDIOGRAM (TEE);  Surgeon: Josue Hector, MD;  Location: [facility] ENDOSCOPY;  Service: Cardiovascular;  Laterality: N/A;  . Abdominal hysterectomy  2000    left both ovaries in  . Tonsillectomy and adenoidectomy      pt was 49 years old    Family History  Problem Relation Age of Onset  . Breast cancer Mother   . Breast cancer Maternal Grandmother   . Cancer Maternal Grandfather 22    prostate  . Ovarian cancer Other    Social History:  reports that she has been smoking Cigarettes.  She has been smoking about 0.25 packs per day. She has never used smokeless tobacco. She reports that she drinks alcohol. She reports that she does not use illicit drugs.  Allergies: No Known  Allergies  Medications:                                                                                                                           No current facility-administered medications for this encounter.   Current Outpatient Prescriptions  Medication Sig Dispense Refill  . anastrozole (ARIMIDEX) 1 MG tablet Take 1 tablet (1 mg total) by mouth every evening. 7 tablet 0  . aspirin EC 325 MG EC tablet Take 1 tablet (325 mg total) by mouth daily. 30 tablet 0  . esomeprazole (NEXIUM) 20 MG capsule Take 20 mg by mouth daily as needed (for heartburn).     . Hypromellose (ARTIFICIAL TEARS OP) Apply 2 drops to eye 2 (two) times daily as needed (for dry eyes).    . prochlorperazine (COMPAZINE) 5 MG tablet Take 5 mg by mouth every 6 (six) hours as needed for nausea or vomiting.    . traMADol (ULTRAM) 50 MG tablet Take 1 tablet (50 mg total) by mouth every 6 (six) hours as needed for moderate pain or severe pain. 15 tablet 0     ROS:                                                                                                                                       History obtained from the patient  General  ROS: negative for - chills, fatigue, fever, night sweats, weight gain or weight loss Psychological ROS: negative for - behavioral disorder, hallucinations, memory difficulties, mood swings or suicidal ideation Ophthalmic ROS: negative for - blurry vision, double vision, eye pain or loss of vision ENT ROS: negative for - epistaxis, nasal discharge, oral lesions, sore throat, tinnitus or vertigo Allergy and Immunology ROS: negative for - hives or itchy/watery eyes Hematological and Lymphatic ROS: negative for - bleeding problems, bruising or swollen lymph nodes Endocrine ROS: negative for - galactorrhea, hair pattern changes, polydipsia/polyuria or temperature intolerance Respiratory ROS: negative for - cough, hemoptysis, shortness of breath or wheezing Cardiovascular ROS: negative for -  chest pain, dyspnea on exertion, edema or irregular heartbeat Gastrointestinal ROS: negative for - abdominal pain, diarrhea, hematemesis, nausea/vomiting or stool incontinence Genito-Urinary ROS: negative for - dysuria, hematuria, incontinence or urinary frequency/urgency Musculoskeletal ROS: negative for - joint swelling or muscular weakness Neurological ROS: as noted in HPI Dermatological ROS: negative for rash and skin lesion changes  Neurologic Examination:                                                                                                      There were no vitals taken for this visit.  HEENT-  Normocephalic, no lesions, without obvious abnormality.  Normal external eye and conjunctiva.  Normal TM's bilaterally.  Normal auditory canals and external ears. Normal external nose, mucus membranes and septum.  Normal pharynx. Cardiovascular- S1, S2 normal, pulses palpable throughout   Lungs- chest clear, no wheezing, rales, normal symmetric air entry Abdomen- normal findings: bowel sounds normal Extremities- no edema Lymph-no adenopathy palpable Musculoskeletal-no joint tenderness, deformity or swelling Skin-warm and dry, no hyperpigmentation, vitiligo, or suspicious lesions  Neurological Examination Mental Status: Alert, oriented, thought content appropriate.  Speech dysarthric without evidence of aphasia.  Able to follow 3 step commands without difficulty. Cranial Nerves: II: Discs flat bilaterally; Visual fields grossly normal, pupils equal, round, reactive to light and accommodation III,IV, VI: ptosis not present, extra-ocular motions intact bilaterally V,VII: smile asymmetric on the left, facial light touch sensation decreased on left cheek VIII: hearing normal bilaterally IX,X: uvula rises symmetrically XI: bilateral shoulder shrug XII: midline tongue extension Motor: Right : Upper extremity   5/5    Left:     Upper extremity   4+/5  Lower extremity   5/5     Lower  extremity   5/5 Tone and bulk:normal tone throughout; no atrophy noted Sensory: Pinprick and light touch intact throughout, bilaterally but she has extinction on the left arm Deep Tendon Reflexes: 2+ and symmetric throughout Plantars: Right: downgoing   Left: downgoing Cerebellar: normal finger-to-nose and normal heel-to-shin test Gait: normal gait and station       Lab Results: Basic Metabolic Panel:  Recent Labs Lab 06/21/15 1048  NA 140  K 4.1  CL 99*  GLUCOSE 97  BUN <3*  CREATININE 0.80    Liver Function Tests: No results for input(s): AST, ALT, ALKPHOS, BILITOT, PROT, ALBUMIN in the last 168 hours. No results for input(s): LIPASE, AMYLASE in  the last 168 hours. No results for input(s): AMMONIA in the last 168 hours.  CBC:  Recent Labs Lab 06/21/15 1048  HGB 17.7*  HCT 52.0*    Cardiac Enzymes: No results for input(s): CKTOTAL, CKMB, CKMBINDEX, TROPONINI in the last 168 hours.  Lipid Panel: No results for input(s): CHOL, TRIG, HDL, CHOLHDL, VLDL, LDLCALC in the last 168 hours.  CBG: No results for input(s): GLUCAP in the last 168 hours.  Microbiology: Results for orders placed or performed during the hospital encounter of 06/11/15  MRSA PCR Screening     Status: None   Collection Time: 06/11/15  4:20 PM  Result Value Ref Range Status   MRSA by PCR NEGATIVE NEGATIVE Final    Comment:        The GeneXpert MRSA Assay (FDA approved for NASAL specimens only), is one component of a comprehensive MRSA colonization surveillance program. It is not intended to diagnose MRSA infection nor to guide or monitor treatment for MRSA infections.     Coagulation Studies: No results for input(s): LABPROT, INR in the last 72 hours.  Imaging: Ct Head Wo Contrast  06/21/2015  CLINICAL DATA:  49 year old female code stroke. Left facial droop. Initial encounter. Right side MCA infarcts on 06/13/2015 brain MRI. EXAM: CT HEAD WITHOUT CONTRAST TECHNIQUE: Contiguous  axial images were obtained from the base of the skull through the vertex without intravenous contrast. COMPARISON:  Brain MRI 06/13/2015.  Head CT 06/11/2015. FINDINGS: Stable paranasal sinuses and mastoids. No acute osseous abnormality identified. No acute orbit or scalp soft tissue finding. No suspicious intracranial vascular hyperdensity. Heterogeneously increased and decreased cortical density along the posterior right MCA territory (series 21, image 17). No mass effect. Cortical hypodensity at the superior para landed cortex appears to correspond to restricted diffusion on the prior MRI. No new areas of involvement identified by CT. No acute intracranial hemorrhage identified. No midline shift, mass effect, or evidence of intracranial mass lesion. No ventriculomegaly. IMPRESSION: 1. Expected evolution of scattered right MCA cortical infarcts seen on 06/12/2015 MRI. 2. No new intracranial abnormality identified. Electronically Signed   By: Genevie Ann M.D.   On: 06/21/2015 10:51    Assessment and plan discussed with with attending physician and they are in agreement.    Etta Quill PA-C [facility] [phone]  06/21/2015, 10:55 AM   Assessment: 49 y.o. female with increased stroke symptoms from recent stroke including left facial numbness, left sided facial droop and left arm extinction. Concern for evolution of previous stroke versus new small right infarct.   Stroke Risk Factors - hypertension, prior stroke.  Recommend: 1) MRI brain 2) Interrogate loop recorder 3) Stroke team to follow up tomorrow.  Patient seen and examined together with physician assistant and I concur with the assessment and plan.  Dorian Pod, MD

## 2015-06-21 NOTE — Code Documentation (Signed)
49yo female arriving to [facility] at 45 via [place].  Patient with recent right brain embolic infarcts on 96/78/9381 and was treated with tPA at [facility] and transferred to [facility].  Patient with symptoms of left hemiparesis and numbness at that time.  Patient was eating breakfast today at 0830 and at 0910 developed slurred speech and left facial droop.  EMS called and activated a Code Stroke.  Stroke team at the bedside on patient arrival.  Labs drawn and patient cleared by Dr. Tamera Punt.  Patient to CT with team.  NIHSS 3, see documentation for details and code stroke times. Patient with left facial droop, mild dysarthria and left neglect on exam.  No treatment with tPA at this time d/t recent stroke per Dr. Armida Sans.  Bedside handoff with ED RN David Stall.

## 2015-06-21 NOTE — ED Provider Notes (Signed)
CSN: [id number]     Arrival date & time 06/21/15  1042 History   First MD Initiated Contact with Patient 06/21/15 1100     Chief Complaint  Patient presents with  . Code Stroke    @EDPCLEARED @ (Consider location/radiation/quality/duration/timing/severity/associated sxs/prior Treatment) HPI   Tina Sharp is a 49 y.o. female who presents for evaluation of acute onset of dysarthria, and weakness. Patient was with family members, at a restaurant when she had trouble talking, and a left facial droop at 9:10 AM this morning. Ambulance was called and she was declared to be code stroke. On arrival, her symptoms were improving, according to family members. The patient remembers acute onset of symptoms of slurred speech. She feels she is somewhat better but not normal, yet. She was hospitalized within the last 2 weeks with an acute stroke, and treated with TPA. This morning before going to the restaurant. She had her loop recorder, assessed, at [facility]. She is taking her usual medications. There've been no intervening illnesses since hospital discharge. There are no other known modifying factors.   Past Medical History  Diagnosis Date  . GERD (gastroesophageal reflux disease)   . Cancer [facility]) June 2015    Invasive lobular carcinoma, 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 ER/ PR+; Her 2 neu not overexpressed, microscopic positive margin (skin).  . Hypertension   . Depression   . Anxiety   . 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 Laparoscopic hysterectomy  2010    [facility]  . Breast surgery Left 04/08/14    mastectomy  . Reduction mammoplasty Right 04/08/14    Dr Tula Nakayama  . Colonoscopy    . Ep implantable device N/A 06/14/2015    Procedure: Loop Recorder Insertion;  Surgeon: Evans Lance, MD;  Location: [place] CV LAB;  Service: Cardiovascular;  Laterality: N/A;  . Tee  without cardioversion N/A 06/14/2015    Procedure: TRANSESOPHAGEAL ECHOCARDIOGRAM (TEE);  Surgeon: Josue Hector, MD;  Location: [facility] ENDOSCOPY;  Service: Cardiovascular;  Laterality: N/A;  . Abdominal hysterectomy  2000    left both ovaries in  . Tonsillectomy and adenoidectomy      pt was 49 years old   Family History  Problem Relation Age of Onset  . Breast cancer Mother   . Breast cancer Maternal Grandmother   . Cancer Maternal Grandfather 14    prostate  . Ovarian cancer Other    Social History  Substance Use Topics  . Smoking status: Current Every Day Smoker -- 0.25 packs/day    Types: Cigarettes  . Smokeless tobacco: Never Used     Comment: E cig  . Alcohol Use: 0.0 oz/week    0 Standard drinks or equivalent per week     Comment: social   OB History    Gravida Para Term Preterm AB TAB SAB Ectopic Multiple Living   2 2              Obstetric Comments   1st Menstrual Cycle:  11  1st Pregnancy:  21     Review of Systems  All other systems reviewed and are negative.     Allergies  Review of patient's allergies indicates no known allergies.  Home Medications   Prior to Admission medications   Medication Sig Start Date End Date Taking? Authorizing Provider  anastrozole (ARIMIDEX) 1 MG tablet Take 1 tablet (1  mg total) by mouth every evening. 06/15/15  Yes Cammie Sickle, MD  aspirin EC 325 MG EC tablet Take 1 tablet (325 mg total) by mouth daily. 06/14/15  Yes Donzetta Starch, NP  esomeprazole (NEXIUM) 20 MG capsule Take 20 mg by mouth daily as needed (for heartburn).    Yes Historical Provider, MD  Hypromellose (ARTIFICIAL TEARS OP) Apply 2 drops to eye 2 (two) times daily as needed (for dry eyes).   Yes Historical Provider, MD  prochlorperazine (COMPAZINE) 5 MG tablet Take 5 mg by mouth every 6 (six) hours as needed for nausea or vomiting.   Yes Historical Provider, MD  traMADol (ULTRAM) 50 MG tablet Take 1 tablet (50 mg total) by mouth every 6 (six) hours as  needed for moderate pain or severe pain. Patient taking differently: Take 25 mg by mouth every 6 (six) hours as needed for moderate pain or severe pain.  06/14/15  Yes Donzetta Starch, NP   BP 123/81 mmHg  Pulse 87  Temp(Src) 97.4 F (36.3 C) (Oral)  Resp 20  Ht 5\' 2"  (1.575 m)  Wt 151 lb 14.4 oz (68.9 kg)  BMI 27.78 kg/m2  SpO2 94% Physical Exam  Constitutional: She is oriented to person, place, and time. She appears well-developed and well-nourished.  HENT:  Head: Normocephalic and atraumatic.  Right Ear: External ear normal.  Left Ear: External ear normal.  Eyes: Conjunctivae and EOM are normal. Pupils are equal, round, and reactive to light.  Neck: Normal range of motion and phonation normal. Neck supple.  Cardiovascular: Normal rate, regular rhythm and normal heart sounds.   Pulmonary/Chest: Effort normal and breath sounds normal. She exhibits no bony tenderness.  Abdominal: Soft. There is no tenderness.  Musculoskeletal: Normal range of motion.  Neurological: She is alert and oriented to person, place, and time. No cranial nerve deficit or sensory deficit. She exhibits normal muscle tone. Coordination normal.  No distinct facial asymmetry. When she speaks. Her speech is somewhat abnormal. She is not clearly dysarthric. She holds her jaw, somewhat clenched. There is no extremity weakness.  Skin: Skin is warm, dry and intact.  Psychiatric: She has a normal mood and affect. Her behavior is normal. Judgment and thought content normal.  Nursing note and vitals reviewed.   ED Course  Procedures (including critical care time)  She was evaluated by the stroke service, on arrival, at the bridge. She was not given TPA, because of recent treatment with TPA, suspected evolution of stroke and low NIH.   11:00- case discussed with the stroke neurologist, who will see the patient, as a Optometrist.  Discussed with hospitalist, to arrange admission. Temperature admission orders written.     Medications  fentaNYL (SUBLIMAZE) injection 100 mcg (not administered)  ondansetron (ZOFRAN) injection 4 mg (not administered)    Patient Vitals for the past 24 hrs:  BP Temp Temp src Pulse Resp SpO2 Height Weight  06/21/15 1215 123/81 mmHg - - 87 20 94 % - -  06/21/15 1200 119/81 mmHg - - 95 12 96 % - -  06/21/15 1146 - 97.4 F (36.3 C) - - - - - -  06/21/15 1145 134/87 mmHg - - 90 17 96 % - -  06/21/15 1130 126/85 mmHg - - 97 22 94 % - -  06/21/15 1058 144/87 mmHg 97.4 F (36.3 C) Oral 98 20 96 % 5\' 2"  (1.575 m) 151 lb 14.4 oz (68.9 kg)    2:06 PM Reevaluation with update and  discussion. After initial assessment and treatment, an updated evaluation reveals she now complains of a mild headache. Family members state that her speech has improved. Findings discussed with patient, all questions answered. Iman Orourke L   2:17 PM-Consult complete with Hospitalist. Patient case explained and discussed. She agrees to admit patient for further evaluation and treatment. Call ended at 14:24  Labs Review Labs Reviewed  CBC - Abnormal; Notable for the following:    WBC 10.8 (*)    Hemoglobin 15.8 (*)    HCT 46.6 (*)    MCV 112.6 (*)    MCH 38.2 (*)    RDW 16.1 (*)    All other components within normal limits  DIFFERENTIAL - Abnormal; Notable for the following:    Neutro Abs 8.1 (*)    All other components within normal limits  COMPREHENSIVE METABOLIC PANEL - Abnormal; Notable for the following:    Glucose, Bld 102 (*)    BUN <5 (*)    Calcium 8.8 (*)    Total Protein 5.7 (*)    Albumin 3.2 (*)    AST 47 (*)    All other components within normal limits  URINALYSIS, ROUTINE W REFLEX MICROSCOPIC (NOT AT [facility] - Abnormal; Notable for the following:    Color, Urine AMBER (*)    Bilirubin Urine SMALL (*)    Ketones, ur 15 (*)    All other components within normal limits  I-STAT CHEM 8, ED - Abnormal; Notable for the following:    Chloride 99 (*)    BUN <3 (*)    Calcium, Ion  1.04 (*)    Hemoglobin 17.7 (*)    HCT 52.0 (*)    All other components within normal limits  ETHANOL  PROTIME-INR  APTT  URINE RAPID DRUG SCREEN, HOSP PERFORMED  I-STAT TROPOININ, ED    Imaging Review Ct Head Wo Contrast  06/21/2015  ADDENDUM REPORT: 06/21/2015 11:15 ADDENDUM: Study discussed by telephone with Dr. Armida Sans on 06/21/2015 at 1110 hours. Electronically Signed   By: Genevie Ann M.D.   On: 06/21/2015 11:15  06/21/2015  CLINICAL DATA:  49 year old female code stroke. Left facial droop. Initial encounter. Right side MCA infarcts on 06/13/2015 brain MRI. EXAM: CT HEAD WITHOUT CONTRAST TECHNIQUE: Contiguous axial images were obtained from the base of the skull through the vertex without intravenous contrast. COMPARISON:  Brain MRI 06/13/2015.  Head CT 06/11/2015. FINDINGS: Stable paranasal sinuses and mastoids. No acute osseous abnormality identified. No acute orbit or scalp soft tissue finding. No suspicious intracranial vascular hyperdensity. Heterogeneously increased and decreased cortical density along the posterior right MCA territory (series 21, image 17). No mass effect. Cortical hypodensity at the superior para landed cortex appears to correspond to restricted diffusion on the prior MRI. No new areas of involvement identified by CT. No acute intracranial hemorrhage identified. No midline shift, mass effect, or evidence of intracranial mass lesion. No ventriculomegaly. IMPRESSION: 1. Expected evolution of scattered right MCA cortical infarcts seen on 06/12/2015 MRI. 2. No new intracranial abnormality identified. Electronically Signed: By: Genevie Ann M.D. On: 06/21/2015 10:51   Mr Brain Wo Contrast  06/21/2015  ADDENDUM REPORT: 06/21/2015 13:28 ADDENDUM: Critical Value/emergent results were called by telephone at the time of interpretation on 06/21/2015 at 1:28 pm to Dr. Dorian Pod , who verbally acknowledged these results. Electronically Signed   By: Franchot Gallo M.D.   On: 06/21/2015  13:28  06/21/2015  CLINICAL DATA:  Recent stroke with tPA treatment. Acute onset of worsening of  symptoms today. Dysarthria and left facial droop. Left arm inattention. EXAM: MRI HEAD WITHOUT CONTRAST TECHNIQUE: Multiplanar, multiecho pulse sequences of the brain and surrounding structures were obtained without intravenous contrast. COMPARISON:  CT head 06/21/2015.  MRI head 06/12/2015 FINDINGS: Resolving areas of acute infarction in the right cerebral hemisphere including a small area in the right frontal lobe, small area in the posterior insula on the right as well as patchy areas in the right parietal white matter and cortex. Interval development of mild amount of hemorrhage in the right posterior insular infarct and in the right posterior parietal lobe. These areas did not show hemorrhage on the prior study. Review of the CT from today does reveal mild hyperintensity in the right parietal lobe in the hemorrhage may be related to prior tPA administration versus more recent hemorrhage. Ventricle size is normal. No shift of the midline structures. Negative for mass lesion. Paranasal sinuses clear. IMPRESSION: Improving areas of subacute infarction in the right MCA territory compared with 06/12/2015. Interval development of mild amount of hemorrhage in the right posterior insular infarct and in the right posterior parietal lobe which appear to have occurred since the prior MRI. No acute ischemic infarction. Electronically Signed: By: Franchot Gallo M.D. On: 06/21/2015 13:22   I have personally reviewed and evaluated these images and lab results as part of my medical decision-making.   EKG Interpretation None      MDM   Final diagnoses:  Stroke with cerebral ischemia (HCC)    Recurrent CVA, now with bleeding, associated with prior infarct and administration of TPA. She will require admission for monitoring stabilization and further evaluation is needed.   Nursing Notes Reviewed/ Care Coordinated,  and agree without changes. Applicable Imaging Reviewed.  Interpretation of Laboratory Data incorporated into ED treatment   Plan: Admit    Daleen Bo, MD 06/22/15 [phone]

## 2015-06-21 NOTE — ED Notes (Signed)
Per ems-- pt had stroke with tpa 10 days ago. Today at 910 pt c/o L sided facial droop, slurred speech, headache.

## 2015-06-21 NOTE — Progress Notes (Signed)
ILR Wound check appointment. Steri-strips removed. Wound without redness or edema. Incision edges approximated, wound well healed. Normal device function. Pt with 0 tachy episodes; 0 brady episodes; 0 asystole.  [facility] monthly summary reports & ROV w/ SK/B in 64yr.

## 2015-06-21 NOTE — ED Notes (Signed)
Pt transported to MRI 

## 2015-06-21 NOTE — H&P (Signed)
[facility] History and Physical  TELENA PEYSER IHW:[id number] DOB: 1965-09-04 DOA: 06/21/2015  Referring physician: Eulis Foster PCP: Chriss Czar, MD   Chief Complaint: dyarthria and left sided weakness  HPI: Tina Sharp is a pleasant 49 y.o. female with a past medical history that includes GERD, skin cancer, breast cancer, hypertension, anxiety, stroke status post IV TPA and days ago presents to the emergency Department chief complaint of dysarthria and left-sided weakness. Initial evaluation includes consultation from neurologist concerned for evolution of previous stroke versus new small right infarct and it recommended admission for further workup.  Patient states she's been home 10 days since her prior stroke was in her usual state of health until this morning when she developed headache mild visual disturbances slurred speech left facial droop worsening left arm weakness and left leg heaviness. She was with her family who also indicate she was having difficulty finding the right words. Patient denies vertigo or double vision difficulty swallowing or confusion.  Workup includes an MRI revealing improving areas of subacute infarct in the right MCA territory. Interval development of mild amount of hemorrhage in the right posterior insular infarct in the right posterior parietal lobe associated with previous MRI. Her loop recorder was interrogated and no abnormalities noted. Lab work included a Sales promotion account executive has serum glucose 102 calcium 8.8, CBC with to be BCs 10.8 hemoglobin 15.8 MCV 112.6. Urinalysis unremarkable. EKG was sinus rhythm Low voltage, precordial leads Borderline T abnormalities, anterior leads Borderline prolonged QT interval. She is afebrile hemodynamically stable and not hypoxic    Review of Systems:  10 point review of systems complete and all systems are negative except as indicated in the history of present illness Past Medical History  Diagnosis  Date  . GERD (gastroesophageal reflux disease)   . Cancer [facility]) June 2015    Invasive lobular carcinoma, 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 ER/ PR+; Her 2 neu not overexpressed, microscopic positive margin (skin).  . Hypertension   . Depression   . Anxiety   . 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 Laparoscopic hysterectomy  2010    [facility]  . Breast surgery Left 04/08/14    mastectomy  . Reduction mammoplasty Right 04/08/14    Dr Tula Nakayama  . Colonoscopy    . Ep implantable device N/A 06/14/2015    Procedure: Loop Recorder Insertion;  Surgeon: Evans Lance, MD;  Location: [place] CV LAB;  Service: Cardiovascular;  Laterality: N/A;  . Tee without cardioversion N/A 06/14/2015    Procedure: TRANSESOPHAGEAL ECHOCARDIOGRAM (TEE);  Surgeon: Josue Hector, MD;  Location: [facility] ENDOSCOPY;  Service: Cardiovascular;  Laterality: N/A;  . Abdominal hysterectomy  2000    left both ovaries in  . Tonsillectomy and adenoidectomy      pt was 49 years old   Social History:  reports that she has been smoking Cigarettes.  She has been smoking about 0.25 packs per day. She has never used smokeless tobacco. She reports that she drinks alcohol. She reports that she does not use illicit drugs. She continues to smoke lives at home with her husband is independent with ADLs No Known Allergies  Family History  Problem Relation Age of Onset  . Breast cancer Mother   . Breast cancer Maternal Grandmother   . Cancer Maternal Grandfather 38    prostate  . Ovarian cancer Other  Prior to Admission medications   Medication Sig Start Date End Date Taking? Authorizing Provider  anastrozole (ARIMIDEX) 1 MG tablet Take 1 tablet (1 mg total) by mouth every evening. 06/15/15  Yes Cammie Sickle, MD  aspirin EC 325 MG EC tablet Take 1 tablet (325 mg total) by mouth daily. 06/14/15  Yes Donzetta Starch, NP   esomeprazole (NEXIUM) 20 MG capsule Take 20 mg by mouth daily as needed (for heartburn).    Yes Historical Provider, MD  Hypromellose (ARTIFICIAL TEARS OP) Apply 2 drops to eye 2 (two) times daily as needed (for dry eyes).   Yes Historical Provider, MD  prochlorperazine (COMPAZINE) 5 MG tablet Take 5 mg by mouth every 6 (six) hours as needed for nausea or vomiting.   Yes Historical Provider, MD  traMADol (ULTRAM) 50 MG tablet Take 1 tablet (50 mg total) by mouth every 6 (six) hours as needed for moderate pain or severe pain. Patient taking differently: Take 25 mg by mouth every 6 (six) hours as needed for moderate pain or severe pain.  06/14/15  Yes Donzetta Starch, NP   Physical Exam: Filed Vitals:   06/21/15 1145 06/21/15 1146 06/21/15 1200 06/21/15 1215  BP: 134/87  119/81 123/81  Pulse: 90  95 87  Temp:  97.4 F (36.3 C)    TempSrc:      Resp: 17  12 20   Height:      Weight:      SpO2: 96%  96% 94%    Wt Readings from Last 3 Encounters:  06/21/15 68.9 kg (151 lb 14.4 oz)  06/20/15 64.6 kg (142 lb 6.7 oz)  06/11/15 66.9 kg (147 lb 7.8 oz)    General:  Appears calm and comfortable Eyes: PERRL, normal lids, irises & conjunctiva ENT: grossly normal hearing, lips & tongue, mild left facial droop Neck: no LAD, masses or thyromegaly Cardiovascular: RRR, no m/r/g. No LE edema. Telemetry: SR, no arrhythmias  Respiratory: CTA bilaterally, no w/r/r. Normal respiratory effort. Abdomen: soft, ntnd Skin: no rash or induration seen on limited exam Musculoskeletal: grossly normal tone BUE/BLE Psychiatric: grossly normal mood and affect, speech fluent and appropriate Neurologic: grossly non-focal. Speech clear left grip 3 out of 5 right grip 5 out of 5 lower extremity strength 5 out of 5 bilaterally           Labs on Admission:  Basic Metabolic Panel:  Recent Labs Lab 06/21/15 1035 06/21/15 1048  NA 139 140  K 4.1 4.1  CL 102 99*  CO2 26  --   GLUCOSE 102* 97  BUN <5* <3*   CREATININE 0.92 0.80  CALCIUM 8.8*  --    Liver Function Tests:  Recent Labs Lab 06/21/15 1035  AST 47*  ALT 26  ALKPHOS 92  BILITOT 1.1  PROT 5.7*  ALBUMIN 3.2*   No results for input(s): LIPASE, AMYLASE in the last 168 hours. No results for input(s): AMMONIA in the last 168 hours. CBC:  Recent Labs Lab 06/21/15 1035 06/21/15 1048  WBC 10.8*  --   NEUTROABS 8.1*  --   HGB 15.8* 17.7*  HCT 46.6* 52.0*  MCV 112.6*  --   PLT 376  --    Cardiac Enzymes: No results for input(s): CKTOTAL, CKMB, CKMBINDEX, TROPONINI in the last 168 hours.  BNP (last 3 results) No results for input(s): BNP in the last 8760 hours.  ProBNP (last 3 results) No results for input(s): PROBNP in the last 8760 hours.  CBG:  No results for input(s): GLUCAP in the last 168 hours.  Radiological Exams on Admission: Ct Head Wo Contrast  06/21/2015  ADDENDUM REPORT: 06/21/2015 11:15 ADDENDUM: Study discussed by telephone with Dr. Armida Sans on 06/21/2015 at 1110 hours. Electronically Signed   By: Genevie Ann M.D.   On: 06/21/2015 11:15  06/21/2015  CLINICAL DATA:  49 year old female code stroke. Left facial droop. Initial encounter. Right side MCA infarcts on 06/13/2015 brain MRI. EXAM: CT HEAD WITHOUT CONTRAST TECHNIQUE: Contiguous axial images were obtained from the base of the skull through the vertex without intravenous contrast. COMPARISON:  Brain MRI 06/13/2015.  Head CT 06/11/2015. FINDINGS: Stable paranasal sinuses and mastoids. No acute osseous abnormality identified. No acute orbit or scalp soft tissue finding. No suspicious intracranial vascular hyperdensity. Heterogeneously increased and decreased cortical density along the posterior right MCA territory (series 21, image 17). No mass effect. Cortical hypodensity at the superior para landed cortex appears to correspond to restricted diffusion on the prior MRI. No new areas of involvement identified by CT. No acute intracranial hemorrhage identified. No  midline shift, mass effect, or evidence of intracranial mass lesion. No ventriculomegaly. IMPRESSION: 1. Expected evolution of scattered right MCA cortical infarcts seen on 06/12/2015 MRI. 2. No new intracranial abnormality identified. Electronically Signed: By: Genevie Ann M.D. On: 06/21/2015 10:51   Mr Brain Wo Contrast  06/21/2015  ADDENDUM REPORT: 06/21/2015 13:28 ADDENDUM: Critical Value/emergent results were called by telephone at the time of interpretation on 06/21/2015 at 1:28 pm to Dr. Dorian Pod , who verbally acknowledged these results. Electronically Signed   By: Franchot Gallo M.D.   On: 06/21/2015 13:28  06/21/2015  CLINICAL DATA:  Recent stroke with tPA treatment. Acute onset of worsening of symptoms today. Dysarthria and left facial droop. Left arm inattention. EXAM: MRI HEAD WITHOUT CONTRAST TECHNIQUE: Multiplanar, multiecho pulse sequences of the brain and surrounding structures were obtained without intravenous contrast. COMPARISON:  CT head 06/21/2015.  MRI head 06/12/2015 FINDINGS: Resolving areas of acute infarction in the right cerebral hemisphere including a small area in the right frontal lobe, small area in the posterior insula on the right as well as patchy areas in the right parietal white matter and cortex. Interval development of mild amount of hemorrhage in the right posterior insular infarct and in the right posterior parietal lobe. These areas did not show hemorrhage on the prior study. Review of the CT from today does reveal mild hyperintensity in the right parietal lobe in the hemorrhage may be related to prior tPA administration versus more recent hemorrhage. Ventricle size is normal. No shift of the midline structures. Negative for mass lesion. Paranasal sinuses clear. IMPRESSION: Improving areas of subacute infarction in the right MCA territory compared with 06/12/2015. Interval development of mild amount of hemorrhage in the right posterior insular infarct and in the right  posterior parietal lobe which appear to have occurred since the prior MRI. No acute ischemic infarction. Electronically Signed: By: Franchot Gallo M.D. On: 06/21/2015 13:22    EKG: Independently reviewed as noted above Assessment/Plan Principal Problem:   CVA (cerebral infarction) Active Problems:   Breast cancer ([place])   Stroke (cerebrum) (HCC) cryptogenic R brain infarcts s/p IV tPA    Cigarette smoker  #1. Slurred speech with dysarthria in the setting of CVA status post TPA 10 days ago. Symptoms improving at the time of admission exam. Evaluated by neurology who recommends repeat MRI and overnight observation. Concern for extension of former stroke or new infarct. MRI shows  no new infarct. In previous workup MR and MRA with no proximal flow reducing lesion observed on MRA subtle irregularity and beading of the distal right MCA branches compared to the left. Echo done 10 days ago mild LVH ejection fraction 76% grade 1 diastolic dysfunction. Carotid Dopplers suggest low range 40-59% right internal carotid artery stenosis and 1-39% left internal carotid artery stenosis Will request PT and OT. She passed the bedside swallow eval. Stroke team will follow.  #2. Implantable loop recorder done at the beginning of the month to monitor for A. fib. Was interrogated in the emergency department. No abnormalities noted.  3 breast cancer. She's had a previous mastectomy with left breast augmentation. She saw her oncologist yesterday. Chart review indicates on ad jaunt anastrozole.  #4. Macrocytosis. Appears chronic and stable. Heme onc following. Platelets within limits of normal.  #5. Tobacco use. Cessation counseling offered.  Dr Armida Sans   Code Status: full DVT Prophylaxis: Family Communication: daughter and husband at bedside Disposition Plan: home hopefully 24 hours  Time spent: 58 minutes  [facility]

## 2015-06-21 NOTE — Progress Notes (Signed)
Patient arrived around 1630 alert oriented complaining of slight headache waiting for orders to clear for pain medications IV started patient resting will continue to monitor.

## 2015-06-21 NOTE — ED Notes (Signed)
Receiving RN unable to receive report, number given

## 2015-06-22 ENCOUNTER — Encounter (HOSPITAL_COMMUNITY): Payer: Self-pay | Admitting: Radiology

## 2015-06-22 ENCOUNTER — Observation Stay (HOSPITAL_COMMUNITY): Payer: BLUE CROSS/BLUE SHIELD

## 2015-06-22 DIAGNOSIS — I63231 Cerebral infarction due to unspecified occlusion or stenosis of right carotid arteries: Secondary | ICD-10-CM | POA: Diagnosis not present

## 2015-06-22 DIAGNOSIS — Z720 Tobacco use: Secondary | ICD-10-CM | POA: Diagnosis not present

## 2015-06-22 DIAGNOSIS — C50912 Malignant neoplasm of unspecified site of left female breast: Secondary | ICD-10-CM | POA: Diagnosis not present

## 2015-06-22 DIAGNOSIS — I69328 Other speech and language deficits following cerebral infarction: Secondary | ICD-10-CM | POA: Diagnosis not present

## 2015-06-22 LAB — FOLATE: Folate: 3.9 ng/mL — ABNORMAL LOW (ref >5.9)

## 2015-06-22 LAB — VITAMIN B12: Vitamin B-12: 179 pg/mL — ABNORMAL LOW (ref 180–914)

## 2015-06-22 MED ORDER — WARFARIN SODIUM 5 MG PO TABS
5.0000 mg | ORAL_TABLET | Freq: Every day | ORAL | Status: DC
  Administered 2015-06-22: 5 mg via ORAL
  Filled 2015-06-22: qty 1

## 2015-06-22 MED ORDER — COUMADIN BOOK
Freq: Once | Status: AC
Start: 2015-06-22 — End: 2015-06-22
  Administered 2015-06-22: 14:00:00
  Filled 2015-06-22: qty 1

## 2015-06-22 MED ORDER — ENOXAPARIN SODIUM 60 MG/0.6ML ~~LOC~~ SOLN
60.0000 mg | Freq: Two times a day (BID) | SUBCUTANEOUS | Status: DC
  Filled 2015-06-22: qty 0.6

## 2015-06-22 MED ORDER — ENOXAPARIN SODIUM 60 MG/0.6ML ~~LOC~~ SOLN
60.0000 mg | SUBCUTANEOUS | Status: AC
  Administered 2015-06-22: 60 mg via SUBCUTANEOUS
  Filled 2015-06-22: qty 0.6

## 2015-06-22 MED ORDER — NICOTINE 7 MG/24HR TD PT24
7.0000 mg | MEDICATED_PATCH | Freq: Every day | TRANSDERMAL | Status: DC
  Administered 2015-06-22: 7 mg via TRANSDERMAL
  Filled 2015-06-22: qty 1

## 2015-06-22 MED ORDER — VITAMIN B-12 100 MCG PO TABS
500.0000 ug | ORAL_TABLET | Freq: Every day | ORAL | Status: DC
  Administered 2015-06-22: 500 ug via ORAL
  Filled 2015-06-22: qty 5

## 2015-06-22 MED ORDER — WARFARIN - PHARMACIST DOSING INPATIENT: Freq: Every day | Status: DC

## 2015-06-22 MED ORDER — FOLIC ACID 1 MG PO TABS: 1.0000 mg | ORAL_TABLET | Freq: Every day | ORAL | Status: DC

## 2015-06-22 MED ORDER — WARFARIN SODIUM 5 MG PO TABS: 5.0000 mg | ORAL_TABLET | Freq: Every day | ORAL | Status: DC

## 2015-06-22 MED ORDER — PYRIDOXINE HCL 25 MG PO TABS
25.0000 mg | ORAL_TABLET | Freq: Every day | ORAL | Status: DC
  Administered 2015-06-22: 25 mg via ORAL
  Filled 2015-06-22: qty 1

## 2015-06-22 MED ORDER — ENOXAPARIN SODIUM 100 MG/ML ~~LOC~~ SOLN: 100.0000 mg | SUBCUTANEOUS | Status: DC

## 2015-06-22 MED ORDER — IOHEXOL 350 MG/ML SOLN
50.0000 mL | Freq: Once | INTRAVENOUS | Status: AC | PRN
  Administered 2015-06-22: 50 mL via INTRAVENOUS

## 2015-06-22 MED ORDER — FOLIC ACID 1 MG PO TABS
1.0000 mg | ORAL_TABLET | Freq: Every day | ORAL | Status: DC
  Administered 2015-06-22: 1 mg via ORAL
  Filled 2015-06-22: qty 1

## 2015-06-22 MED ORDER — WARFARIN VIDEO
Freq: Once | Status: AC
  Administered 2015-06-22: 14:00:00

## 2015-06-22 MED ORDER — CYANOCOBALAMIN 500 MCG PO TABS: 500.0000 ug | ORAL_TABLET | Freq: Every day | ORAL | Status: DC

## 2015-06-22 NOTE — Progress Notes (Signed)
Pt discharged back to home per MD order. Pt educated by Therapist, sports. All questions and concerns answered. Pt's IV removed before discharge. Pt exited hospital via wheelchair.

## 2015-06-22 NOTE — Evaluation (Signed)
Physical Therapy Evaluation and Discharge  Patient Details Name: Tina Sharp MRN: [id number] DOB: 06/25/1966 Today's Date: 06/22/2015   History of Present Illness  Pt presented with dysarthria, LUE weakness, and Lt inattention (symptoms worsened from recent Rt MCA CVA. MRI prior Rt MCA CVA and expected evolution with hemorrhagic transformation of previous R MCA infarct PMHx- breast CA    Clinical Impression  Patient evaluated by Physical Therapy with no further acute PT needs identified. The patient has no further questions and denies any problems with balance or mobility on recent d/c home.  PT is signing off. Thank you for this referral.     Follow Up Recommendations No PT follow up (was going for OPOT at [facility])    Equipment Recommendations  None recommended by PT    Recommendations for Other Services OT consult     Precautions / Restrictions Restrictions Weight Bearing Restrictions: No      Mobility  Bed Mobility Overal bed mobility: Independent                Transfers Overall transfer level: Independent Equipment used: None             General transfer comment: No cues or physical assist needed  Ambulation/Gait Ambulation/Gait assistance: Independent Ambulation Distance (Feet): 150 Feet Assistive device: None Gait Pattern/deviations: WFL(Within Functional Limits)   Gait velocity interpretation: at or above normal speed for age/gender General Gait Details: No physical assist needed.  Pt stable w/ challenges to balance during ambulation as documented below.  Stairs            Wheelchair Mobility    Modified Rankin (Stroke Patients Only) Modified Rankin (Stroke Patients Only) Pre-Morbid Rankin Score: Slight disability Modified Rankin: Slight disability     Balance Overall balance assessment: No apparent balance deficits (not formally assessed)                               Standardized Balance  Assessment Standardized Balance Assessment : Dynamic Gait Index   Dynamic Gait Index Level Surface: Normal Change in Gait Speed: Normal Gait with Horizontal Head Turns: Normal Gait with Vertical Head Turns: Normal Gait and Pivot Turn: Normal       Pertinent Vitals/Pain Pain Assessment: No/denies pain    Home Living Family/patient expects to be discharged to:: Private residence Living Arrangements: Spouse/significant other;Children (58 year old daughter) Available Help at Discharge: Family;Available PRN/intermittently Type of Home: House Home Access: Stairs to enter Entrance Stairs-Rails: None Entrance Stairs-Number of Steps: 1+1 Home Layout: One level Home Equipment: None      Prior Function Level of Independence: Independent         Comments: 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     Hand Dominance   Dominant Hand: Right    Extremity/Trunk Assessment   Upper Extremity Assessment: Defer to OT evaluation           Lower Extremity Assessment: Overall WFL for tasks assessed      Cervical / Trunk Assessment: Normal  Communication   Communication: No difficulties  Cognition Arousal/Alertness: Awake/alert Behavior During Therapy: WFL for tasks assessed/performed Overall Cognitive Status: Within Functional Limits for tasks assessed                      General Comments      Exercises  Assessment/Plan    PT Assessment Patent does not need any further PT services  PT Diagnosis Hemiplegia non-dominant side   PT Problem List    PT Treatment Interventions     PT Goals (Current goals can be found in the Care Plan section) Acute Rehab PT Goals Patient Stated Goal: return to keyboarding for work    Frequency     Barriers to discharge        Co-evaluation               End of Session Equipment Utilized During Treatment: Gait belt Activity Tolerance: Patient tolerated  treatment well Patient left: in bed;with bed alarm set;with call bell/phone within reach;with family/visitor present      Functional Assessment Tool Used: clinical observation Functional Limitation: Carrying, moving and handling objects Carrying, Moving and Handling Objects Current Status (T6144): At least 60 percent but less than 80 percent impaired, limited or restricted Carrying, Moving and Handling Objects Goal Status [phone]): At least 60 percent but less than 80 percent impaired, limited or restricted Carrying, Moving and Handling Objects Discharge Status [phone]): At least 60 percent but less than 80 percent impaired, limited or restricted    Time: 1141-1151 PT Time Calculation (min) (ACUTE ONLY): 10 min   Charges:   PT Evaluation $Initial PT Evaluation Tier I: 1 Procedure     PT G Codes:   PT G-Codes **NOT FOR INPATIENT CLASS** Functional Assessment Tool Used: clinical observation Functional Limitation: Carrying, moving and handling objects Carrying, Moving and Handling Objects Current Status (P9509): At least 60 percent but less than 80 percent impaired, limited or restricted Carrying, Moving and Handling Objects Goal Status [phone]): At least 60 percent but less than 80 percent impaired, limited or restricted Carrying, Moving and Handling Objects Discharge Status [phone]): At least 60 percent but less than 80 percent impaired, limited or restricted    Tina Sharp 06/22/2015, 12:29 PM Pager [phone]

## 2015-06-22 NOTE — Progress Notes (Signed)
ANTICOAGULATION CONSULT NOTE - Initial Consult  Pharmacy Consult for Lovenox/Coumadin Indication: ICA thrombus + s/p CVA  No Known Allergies  Patient Measurements: Height: 5\' 2"  (157.5 cm) Weight: 147 lb 1.6 oz (66.724 kg) IBW/kg (Calculated) : 50.1 Heparin Dosing Weight:    Vital Signs: Temp: 97.9 F (36.6 C) (11/09 1020) Temp Source: Oral (11/09 1020) BP: 123/81 mmHg (11/09 1020) Pulse Rate: 75 (11/09 1020)  Labs:  Recent Labs  06/21/15 1035 06/21/15 1048  HGB 15.8* 17.7*  HCT 46.6* 52.0*  PLT 376  --   APTT 24  --   LABPROT 13.8  --   INR 1.04  --   CREATININE 0.92 0.80    Estimated Creatinine Clearance: 74.5 mL/min (by C-G formula based on Cr of 0.8).   Medical History: Past Medical History  Diagnosis Date  . GERD (gastroesophageal reflux disease)   . Cancer [facility]) June 2015    Invasive lobular carcinoma, 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 ER/ PR+; Her 2 neu not overexpressed, microscopic positive margin (skin).  . Hypertension   . Depression   . Anxiety   . 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 anastrozole (ARIMIDEX) 1 MG tablet Take 1 tablet (1 mg total) by mouth every evening. 7 tablet 0 06/20/2015 at Unknown time  . aspirin EC 325 MG EC tablet Take 1 tablet (325 mg total) by mouth daily. 30 tablet 0 06/20/2015 at Unknown time  . esomeprazole (NEXIUM) 20 MG capsule Take 20 mg by mouth daily as needed (for heartburn).    Past Week at Unknown time  . Hypromellose (ARTIFICIAL TEARS OP) Apply 2 drops to eye 2 (two) times daily as needed (for dry eyes).   Past Week at Unknown time  . prochlorperazine (COMPAZINE) 5 MG tablet Take 5 mg by mouth every 6 (six) hours as needed for nausea or vomiting.   06/20/2015 at Unknown time  . traMADol (ULTRAM) 50 MG tablet Take 1 tablet (50 mg total) by mouth every 6 (six) hours as needed for  moderate pain or severe pain. (Patient taking differently: Take 25 mg by mouth every 6 (six) hours as needed for moderate pain or severe pain. ) 15 tablet 0 06/20/2015 at Unknown time    Assessment:  This is a 49 year old female history of recent CVA status post TPA 10/29, breast cancer, skin cancer, hypertension, presented to the ED with left-sided weakness and dysarthria. Patient was recently admitted 10 days ago for CVA as patient had left-sided arm numbness and weakness.  Patient continues to smoke. In the emergency department, MRI 11/8 showed improving areas of subacute infarct in the right MCA territory, interval development of mild amount of hemorrhage in the right posterior insular infarct in the right posterior parietal lobe. 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 At risk for additional infarcts.  Anticoagulation: R ICA thrombus. Neuro wishes to start LMWH/Coumadin. Baseline INR 1.04. CBC WNL.  Cardiovascular: HTN  Endocrinology: A1C 5.4  Gastrointestinal / Nutrition: GERD on po PPI  Neurology: CVA x 2 in the last 2 weeks. LDL 65  Nephrology: Scr 0.8  Pulmonary: +tobacco on Nicotine patch.  Hematology / Oncology:Hgb WNL. B12 slightly low and FA level low>>FA/B12 added.  Breast cancer 6/15. Serious adverse events of Anastrozole:  --Cardiovascular: Cerebral infarction, Cerebrovascular accident,  Chest pain (5% to 7% ),  Ischemic heart disease (4% ), Myocardial infarction (1.2% ), Myocardial ischemia, Thrombophlebitis (2% to 5% ), Venous thromboembolism, 2% cerebral ischemia -- With neurology's consideration, I might recommend that she stop this medication.   PTA Medication Issues  Best Practices   Goal of Therapy:  Anti-Xa level 0.6-1 units/ml 4hrs after LMWH dose given Monitor platelets by anticoagulation protocol: Yes   Plan:  D/c SQ Lovenox Plan discharge Lovenox 60 mg SQ BID with CBC q72h Start Coumadin 5mg  po  daily with outpatient follow-up for INR by Monday. Daily INR as inpatient.   Yarethzi Branan S. Alford Highland, PharmD, BCPS Clinical Staff Pharmacist Pager [phone]  Eilene Ghazi Stillinger 06/22/2015,12:20 PM

## 2015-06-22 NOTE — Care Management Note (Signed)
Case Management Note  Patient Details  Name: Tina Sharp MRN: [id number] Date of Birth: 1966/04/02  Subjective/Objective:                    Action/Plan: Patient was recently admitted with a CVA, now readmitted with a hemorrhagic conversion. Lives at home with spouse.  Was set up with [facility] at discharge last admission.  Will continue to follow for discharge needs pending PT/OT evals and physician orders.  Expected Discharge Date:                  Expected Discharge Plan:     In-House Referral:     Discharge planning Services     Post Acute Care Choice:    Choice offered to:     DME Arranged:    DME Agency:     HH Arranged:    HH Agency:     Status of Service:  In process, will continue to follow  Medicare Important Message Given:    Date Medicare IM Given:    Medicare IM give by:    Date Additional Medicare IM Given:    Additional Medicare Important Message give by:     If discussed at [place] of Stay Meetings, dates discussed:    Additional Comments:  Rolm Baptise, RN 06/22/2015, 10:21 AM

## 2015-06-22 NOTE — Discharge Instructions (Signed)

## 2015-06-22 NOTE — Discharge Summary (Signed)
Physician Discharge Summary  Tina Sharp YTK:[id number] DOB: [birth year] DOA: 75/08/6999  PCP: Tina Czar, MD  Admit date: 06/21/2015 Discharge date: 06/22/2015  Time spent: 30 minutes  Recommendations for Outpatient Follow-up:  1. Patient will schedule an appointment with her PCP in the next week to She was instructed to have check PT/INR at PCP's office in 3 days. She was discharged on Lovenox bridge. This was prescribed by neurology for intrlauminal thrombus of R ICA.    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 She presented to the ED on 11/8 with complaints of slurred speech and left sided weakness. Neurology consulted at that time with concern for evolution of previous stroke vs new right infarct. MRI obtained in the ED was negative. Loop recorder placed during prior admission was interrogated in the ED and no abnormalities were noted. Patient was admitted for further workup.   Hospital Course:   CVA - Patient admitted 11/8 after presenting to the ED with slurred speech and left sided weakness. She was recently discharged on 11/1 after having a stroke involving several right cortical embolic infarcts treated with IV TPA. Echo peformed at that time with no source of embolus and loop recorder was placed. MRI performed in the ED 11/8 was negative for new stroke and the loop recorder was interrogated with no abnormalities noted. Neurology consulted and found patient to have no new neuro  deficits, CTA neck performed 11/9 showed R ICA nonocclusive intraluminal thrombus at bifurcation. Their recommendations are for anticoagulation with Lovenox bridge to coumadin and outpatient follow-up. Patient is stable for discharge and will have close follow-up in the next several days with her PCP for INR check.    Breast Cancer - S/P left mastectomy with left breast augmentation. She is followed outpatient by Dr. Rogue Sharp. Continue Anastrozole.   Tobacco Use - Patient was counseled on the importance of smoking cessation especially in the setting of recent stroke.   Macrocytosis - Appears to be chronic and stable. She is followed outpatient with Heme/Onc.   Procedures:  None  Consultations:  Neurology   Discharge Exam: Filed Vitals:   06/22/15 1020  BP: 123/81  Pulse: 75  Temp: 97.9 F (36.6 C)  Resp: 18    General: Pleasant female, sitting upright in bed, NAD. Cardiovascular: Regular rate and rhythm, no murmurs.  Respiratory: Clear and equal bilaterally, no rhonchi or wheezes.  Abdomen: Soft nontender nontender positive bowel sounds Neurological: Cranial nerves II through XII grossly intact, she had bilateral upper and lower extremity 5 out of 5 muscle strength, no alteration to sensation.   Discharge Instructions   Discharge Instructions    Ambulatory referral to Neurology    Complete by:  As directed   Please schedule post stroke follow up in 2 months. - she was in the hospital 10 days ago for stroke, seen by Tina Sharp. Unsure if appt already scheduled. She only needs one.     Diet - low sodium  heart healthy    Complete by:  As directed      Discharge instructions    Complete by:  As directed   Please go to your Family [facility] office in 3 days to have you Coumadin Level (INR) checked.     Increase activity slowly    Complete by:  As directed           Current Discharge Medication List    START taking these medications   Details  enoxaparin (LOVENOX) 100  MG/ML injection Inject 1 mL (100 mg total) into the skin daily. Qty: 5 Syringe, Refills: 0    folic acid (FOLVITE) 1 MG tablet Take 1 tablet (1 mg total) by mouth daily. Qty: 30 tablet, Refills: 0    vitamin B-12 500 MCG tablet Take 1 tablet (500 mcg total) by mouth daily. Qty: 30 tablet, Refills: 0    warfarin (COUMADIN) 5 MG tablet Take 1 tablet (5 mg total) by mouth daily at 6 PM. Qty: 30 tablet, Refills: 1      CONTINUE these medications which have NOT CHANGED   Details  anastrozole (ARIMIDEX) 1 MG tablet Take 1 tablet (1 mg total) by mouth every evening. Qty: 7 tablet, Refills: 0    esomeprazole (NEXIUM) 20 MG capsule Take 20 mg by mouth daily as needed (for heartburn).     Hypromellose (ARTIFICIAL TEARS OP) Apply 2 drops to eye 2 (two) times daily as needed (for dry eyes).    prochlorperazine (COMPAZINE) 5 MG tablet Take 5 mg by mouth every 6 (six) hours as needed for nausea or vomiting.    traMADol (ULTRAM) 50 MG tablet Take 1 tablet (50 mg total) by mouth every 6 (six) hours as needed for moderate pain or severe pain. Qty: 15 tablet, Refills: 0      STOP taking these medications     aspirin EC 325 MG EC tablet        No Known Allergies Follow-up Information    Follow up with SETHI,PRAMOD, MD In 2 months.   Specialties:  Neurology, Radiology   Why:  Stroke Clinic, Office will call you with appointment date & time   Contact information:   [place] [address] [phone]       Follow up with Tina Czar, MD In 2 weeks.   Specialty:  Family Medicine   Contact information:   [address] [place] [address] [phone]       Follow up In 3 days.      Follow up with Tina Czar, MD In 3 days.   Specialty:  Family Medicine   Contact information:   [place]  [address] [phone]        The results of significant diagnostics from this hospitalization (including imaging, microbiology,  ancillary and laboratory) are listed below for reference.    Significant Diagnostic Studies: Dg Chest 2 View  06/21/2015  CLINICAL DATA:  Transient ischemic attack, hypertension EXAM: CHEST  2 VIEW COMPARISON:  06/11/2015 FINDINGS: Heart size and vascular pattern are normal. Right lung is clear. Hazy density over left thorax is likely due to superimposed soft tissue coupled with lordotic positioning. IMPRESSION: No active cardiopulmonary disease. Electronically Signed   By: Skipper Cliche M.D.   On: 06/21/2015 17:58   Ct Head Wo Contrast  06/21/2015  ADDENDUM REPORT: 06/21/2015 11:15 ADDENDUM: Study discussed by telephone with Dr. Armida Sans on 06/21/2015 at 1110 hours. Electronically Signed   By: Herminio Heads.D.  On: 06/21/2015 11:15  06/21/2015  CLINICAL DATA:  49 year old female code stroke. Left facial droop. Initial encounter. Right side MCA infarcts on 06/13/2015 brain MRI. EXAM: CT HEAD WITHOUT CONTRAST TECHNIQUE: Contiguous axial images were obtained from the base of the skull through the vertex without intravenous contrast. COMPARISON:  Brain MRI 06/13/2015.  Head CT 06/11/2015. FINDINGS: Stable paranasal sinuses and mastoids. No acute osseous abnormality identified. No acute orbit or scalp soft tissue finding. No suspicious intracranial vascular hyperdensity. Heterogeneously increased and decreased cortical density along the posterior right MCA territory (series 21, image 17). No mass effect. Cortical hypodensity at the superior para landed cortex appears to correspond to restricted diffusion on the prior MRI. No new areas of involvement identified by CT. No acute intracranial hemorrhage identified. No midline shift, mass effect, or evidence of intracranial mass lesion. No ventriculomegaly. IMPRESSION: 1. Expected evolution of scattered right MCA cortical infarcts seen on 06/12/2015 MRI. 2. No new intracranial abnormality identified. Electronically Signed: By: Genevie Ann M.D. On: 06/21/2015 10:51   Ct  Head Wo Contrast  06/11/2015  CLINICAL DATA:  Code Stroke. Pt with left hand numbness and weakness that started at 11am while driving today. Pt denies any other symptoms at this time. Pt denies hx of stroke, seizure, or ca. EXAM: CT HEAD WITHOUT CONTRAST TECHNIQUE: Contiguous axial images were obtained from the base of the skull through the vertex without intravenous contrast. COMPARISON:  None. FINDINGS: Mild atrophy. There is no evidence of acute intracranial hemorrhage, brain edema, mass lesion, acute infarction, mass effect, or midline shift. Acute infarct may be inapparent on noncontrast CT. No other intra-axial abnormalities are seen, and the ventricles and sulci are within normal limits in size and symmetry. No abnormal extra-axial fluid collections or masses are identified. No significant calvarial abnormality. IMPRESSION: 1. Negative for bleed or other acute intracranial process. Critical Value/emergent results were called by telephone at the time of interpretation on 06/11/2015 at 12:45 pm to Dr. Loura Pardon , who verbally acknowledged these results. Electronically Signed   By: Lucrezia Europe M.D.   On: 06/11/2015 12:48   Ct Angio Neck W/cm &/or Wo/cm  06/22/2015  CLINICAL DATA:  Initial evaluation for recent stroke, status post tPA, with new stroke symptoms. EXAM: CT ANGIOGRAPHY NECK TECHNIQUE: Multidetector CT imaging of the neck was performed using the standard protocol during bolus administration of intravenous contrast. Multiplanar CT image reconstructions and MIPs were obtained to evaluate the vascular anatomy. Carotid stenosis measurements (when applicable) are obtained utilizing NASCET criteria, using the distal internal carotid diameter as the denominator. CONTRAST:  52mL OMNIPAQUE IOHEXOL 350 MG/ML SOLN COMPARISON:  Previous MRI from 06/21/2015 as well as earlier studies. FINDINGS: Aortic arch: Aortic arch is only partially visualized on this exam, not well evaluated. The origin of the left  subclavian artery demonstrates calcified and noncalcified atheromatous plaque without high-grade stenosis. Origin of the low left common and brachiocephalic arteries are not visualized. Visualized subclavian arteries demonstrate no high-grade stenosis or other abnormality. Right carotid system: Right common carotid artery well opacified from its origin to the carotid bifurcation. There is linear filling defects beginning in the right ICA at the level of the carotid bifurcation extending cephalad, consistent with intraluminal thrombus. This is adherent to the medial carotid wall inferiorly, but is located centrally within the carotid lumen more superiorly. Thrombus measures approximately 2.5 cm in overall length. Distally, the right ICA is widely patent without stenosis, dissection, or occlusion. Right ECA and its branches are well opacified. No  thrombus extends into the ECA itself. Left carotid system: Visualized left common carotid artery is widely patent to the carotid bifurcation. Calcified and noncalcified atheromatous plaque present about the carotid bifurcation extending into the proximal left ICA with associated 20% stenosis by NASCET criteria. ICA is well opacified distally without focal stenosis, dissection, or occlusion. Vertebral arteries:Both vertebral arteries arise from the subclavian arteries. Vertebral arteries are well opacified along their entire course without evidence for stenosis, dissection, or occlusion. Skeleton: Reversal of the normal cervical lordosis S with moderate degenerative spondylolysis present at C5-6 and C6-7. No acute osseous abnormality. No worrisome lytic or blastic osseous lesion. Other neck: Visualized lungs are clear. Note is made of a 7 mm hypodense right thyroid nodule, of doubtful clinical significance. No adenopathy. No acute soft tissue abnormality within the neck. IMPRESSION: 1. Nonocclusive intraluminal thrombus within the proximal right ICA beginning at the right  carotid bifurcation as above. Finding is most certainly the embolic source of recently identified right cerebral infarcts. 2. Calcified and noncalcified plaque about the left carotid bifurcation without significant stenosis. 3. Widely patent vertebral arteries. Electronically Signed   By: Jeannine Boga M.D.   On: 06/22/2015 06:40   Mr Brain Wo Contrast  06/21/2015  ADDENDUM REPORT: 06/21/2015 13:28 ADDENDUM: Critical Value/emergent results were called by telephone at the time of interpretation on 06/21/2015 at 1:28 pm to Dr. Dorian Pod , who verbally acknowledged these results. Electronically Signed   By: Franchot Gallo M.D.   On: 06/21/2015 13:28  06/21/2015  CLINICAL DATA:  Recent stroke with tPA treatment. Acute onset of worsening of symptoms today. Dysarthria and left facial droop. Left arm inattention. EXAM: MRI HEAD WITHOUT CONTRAST TECHNIQUE: Multiplanar, multiecho pulse sequences of the brain and surrounding structures were obtained without intravenous contrast. COMPARISON:  CT head 06/21/2015.  MRI head 06/12/2015 FINDINGS: Resolving areas of acute infarction in the right cerebral hemisphere including a small area in the right frontal lobe, small area in the posterior insula on the right as well as patchy areas in the right parietal white matter and cortex. Interval development of mild amount of hemorrhage in the right posterior insular infarct and in the right posterior parietal lobe. These areas did not show hemorrhage on the prior study. Review of the CT from today does reveal mild hyperintensity in the right parietal lobe in the hemorrhage may be related to prior tPA administration versus more recent hemorrhage. Ventricle size is normal. No shift of the midline structures. Negative for mass lesion. Paranasal sinuses clear. IMPRESSION: Improving areas of subacute infarction in the right MCA territory compared with 06/12/2015. Interval development of mild amount of hemorrhage in the right  posterior insular infarct and in the right posterior parietal lobe which appear to have occurred since the prior MRI. No acute ischemic infarction. Electronically Signed: By: Franchot Gallo M.D. On: 06/21/2015 13:22   Mr Brain Wo Contrast  06/12/2015  CLINICAL DATA:  Patient developed acute onset of LEFT hand numbness and weakness 06/11/2015 at the same time she developed a severe RIGHT-sided headache. History of breast cancer. Current smoker. TPA administered. Initial encounter. EXAM: MRI HEAD WITHOUT CONTRAST MRA HEAD WITHOUT CONTRAST TECHNIQUE: Multiplanar, multiecho pulse sequences of the brain and surrounding structures were obtained without intravenous contrast. Angiographic images of the head were obtained using MRA technique without contrast. COMPARISON:  CT head 06/11/2015. FINDINGS: MRI HEAD FINDINGS Multifocal areas of acute infarction throughout primarily the RIGHT MCA territory involving the frontal, parietal, temporal, and insular regions. These are  predominantly cortical rather than subcortical. Small area of RIGHT caudate involvement. No LEFT hemisphere involvement, brainstem or cerebellar involvement. Some areas of cortical cytotoxic edema efface the sulci, but there is no subarachnoid blood seen on FLAIR imaging. No hemorrhage, mass lesion, or extra-axial fluid. Slight atrophy without hydrocephalus. No significant white matter disease. No foci of chronic hemorrhage. Flow voids are maintained. No sinus or mastoid disease.  Normal midline structures. Within limits for detection on noncontrast MR, no visible intracranial metastatic disease. Compared with prior CT, there is no hemorrhage and no visible cytotoxic edema on that study. MRA HEAD FINDINGS The internal carotid arteries are dolichoectatic but widely patent. The basilar artery is widely patent with both vertebrals contributing equally. Fetal origin RIGHT PCA. No proximal stenosis of the middle or posterior cerebral arteries. Hypoplastic  and/or diffusely diseased RIGHT A1 ACA of no significance given the robust and dominant LEFT A1 ACA. The RIGHT M3 and M4 branches of the middle cerebral artery appear more irregular and beaded than those on the LEFT. In the appropriate clinical setting, this could represent manifestation of reversible cerebral vasoconstriction syndrome (RCVS). No intracranial aneurysm. IMPRESSION: Multifocal areas of nonhemorrhagic acute infarction throughout the RIGHT hemisphere, predominantly cortical involvement. No proximal flow reducing lesion is observed on MRA, but there is subtle irregularity and beading of the distal RIGHT MCA branches as compared to the LEFT. 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 Multiple emboli are less favored. No evidence for post tPA complication. Electronically Signed   By: Staci Righter M.D.   On: 06/12/2015 14:58   Dg Chest Portable 1 View  06/11/2015  CLINICAL DATA:  Code stroke. History of breast cancer. Sudden onset of left arm numbness and weakness while driving. EXAM: PORTABLE CHEST 1 VIEW COMPARISON:  02/17/2014 FINDINGS: Cardiomediastinal silhouette is normal. Mediastinal contours appear intact considering portable technique. There is no evidence of focal airspace consolidation, pleural effusion or pneumothorax. Osseous structures are without acute abnormality. Soft tissues are grossly normal. IMPRESSION: No radiographic evidence of acute cardiopulmonary abnormality. Electronically Signed   By: Fidela Salisbury M.D.   On: 06/11/2015 13:01   Mr Jodene Nam Head/brain Wo Cm  06/12/2015  CLINICAL DATA:  Patient developed acute onset of LEFT hand numbness and weakness 06/11/2015 at the same time she developed a severe RIGHT-sided headache. History of breast cancer. Current smoker. TPA administered. Initial encounter. EXAM: MRI HEAD WITHOUT CONTRAST MRA HEAD WITHOUT CONTRAST  TECHNIQUE: Multiplanar, multiecho pulse sequences of the brain and surrounding structures were obtained without intravenous contrast. Angiographic images of the head were obtained using MRA technique without contrast. COMPARISON:  CT head 06/11/2015. FINDINGS: MRI HEAD FINDINGS Multifocal areas of acute infarction throughout primarily the RIGHT MCA territory involving the frontal, parietal, temporal, and insular regions. These are predominantly cortical rather than subcortical. Small area of RIGHT caudate involvement. No LEFT hemisphere involvement, brainstem or cerebellar involvement. Some areas of cortical cytotoxic edema efface the sulci, but there is no subarachnoid blood seen on FLAIR imaging. No hemorrhage, mass lesion, or extra-axial fluid. Slight atrophy without hydrocephalus. No significant white matter disease. No foci of chronic hemorrhage. Flow voids are maintained. No sinus or mastoid disease.  Normal midline structures. Within limits for detection on noncontrast MR, no visible intracranial metastatic disease. Compared with prior CT, there is no hemorrhage and no visible cytotoxic edema on that study. MRA HEAD FINDINGS The internal carotid arteries are dolichoectatic but widely patent. The  basilar artery is widely patent with both vertebrals contributing equally. Fetal origin RIGHT PCA. No proximal stenosis of the middle or posterior cerebral arteries. Hypoplastic and/or diffusely diseased RIGHT A1 ACA of no significance given the robust and dominant LEFT A1 ACA. The RIGHT M3 and M4 branches of the middle cerebral artery appear more irregular and beaded than those on the LEFT. In the appropriate clinical setting, this could represent manifestation of reversible cerebral vasoconstriction syndrome (RCVS). No intracranial aneurysm. IMPRESSION: Multifocal areas of nonhemorrhagic acute infarction throughout the RIGHT hemisphere, predominantly cortical involvement. No proximal flow reducing lesion is observed  on MRA, but there is subtle irregularity and beading of the distal RIGHT MCA branches as compared to the LEFT. 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 Multiple emboli are less favored. No evidence for post tPA complication. Electronically Signed   By: Staci Righter M.D.   On: 06/12/2015 14:58     Labs: Basic Metabolic Panel:  Recent Labs Lab 06/21/15 1035 06/21/15 1048  NA 139 140  K 4.1 4.1  CL 102 99*  CO2 26  --   GLUCOSE 102* 97  BUN <5* <3*  CREATININE 0.92 0.80  CALCIUM 8.8*  --    Liver Function Tests:  Recent Labs Lab 06/21/15 1035  AST 47*  ALT 26  ALKPHOS 92  BILITOT 1.1  PROT 5.7*  ALBUMIN 3.2*   CBC:  Recent Labs Lab 06/21/15 1035 06/21/15 1048  WBC 10.8*  --   NEUTROABS 8.1*  --   HGB 15.8* 17.7*  HCT 46.6* 52.0*  MCV 112.6*  --   PLT 376  --       Signed:  Kaitland Lewellyn PA-S [facility] 06/22/2015, 1:42 PM  Addendum  Personally saw and evaluated patient on 06/22/2015 and agree with the above findings. Mrs Jessie is a pleasant 49 year old female with a past medical history of breast cancer, hypertension, admitted to medicine service on 06/21/2015 when she presented with complaints of left-sided weakness associated with dysarthria. She was recently admitted to the medicine service where she was found to have subacute infarct of the right MCA territory. During this hospitalization she was seen and evaluated by neurology. Repeat MRI of brain without contrast during this hospitalization showing improved areas of subacute infarction in the right MCA territory compared to previous study of 06/12/2015. She was worked up with CTA of the neck that revealed nonocclusive intraluminal thrombus within the proximal right ICA. Dr Erlinda Hong of neurology recommended initiating anticoagulation with warfarin with Lovenox bridge. Aspirin therapy was  discontinued. Patient was provided education on Lovenox administration. On physical examination focal neurological deficits resolving. She reported returning to her baseline and fell on the to go home today. She was discharged to her home on 06/22/2015.

## 2015-06-22 NOTE — Progress Notes (Signed)
STROKE TEAM PROGRESS NOTE   HISTORY Tina Sharp is an 49 y.o. female with a past medical history as delineated below, recently admitted to hospital for stroke and received tPA. At that time no source was found and stroke was thought to be cryptogenic. Symptoms at that time were slurred speech, left facial droop and left arm weakness. Today 06/21/2015 at [place] patient felt her symptoms worsened (LKW). [facility] code [facility]. On arrival she had dysarthria. Left facial droop and left arm inattention. Mild HA but denies vertigo, double vision, difficulty swallowing, confusion, or vision impairment. NIHSS: 3   SUBJECTIVE (INTERVAL HISTORY) Her family is at the bedside.  Family concerned about her condition and repeat stroke symptoms. However, MRI negative for new infarct but CTA neck showed right ICA thrombus. Need anticoagulation.   OBJECTIVE Temp:  [97.4 F (36.3 C)-99.5 F (37.5 C)] 98.8 F (37.1 C) (11/09 0432) Pulse Rate:  [74-98] 79 (11/09 0432) Cardiac Rhythm:  [-] Normal sinus rhythm (11/08 2010) Resp:  [12-22] 18 (11/09 0432) BP: (117-144)/(67-109) 137/89 mmHg (11/09 0432) SpO2:  [94 %-99 %] 98 % (11/09 0432) Weight:  [66.724 kg (147 lb 1.6 oz)-68.9 kg (151 lb 14.4 oz)] 66.724 kg (147 lb 1.6 oz) (11/08 1624)  CBC:  Recent Labs Lab 06/21/15 1035 06/21/15 1048  WBC 10.8*  --   NEUTROABS 8.1*  --   HGB 15.8* 17.7*  HCT 46.6* 52.0*  MCV 112.6*  --   PLT 376  --     Basic Metabolic Panel:  Recent Labs Lab 06/21/15 1035 06/21/15 1048  NA 139 140  K 4.1 4.1  CL 102 99*  CO2 26  --   GLUCOSE 102* 97  BUN <5* <3*  CREATININE 0.92 0.80  CALCIUM 8.8*  --     Lipid Panel:    Component Value Date/Time   CHOL 129 06/12/2015 0305   TRIG 93 06/12/2015 0305   HDL 45 06/12/2015 0305   CHOLHDL 2.9 06/12/2015 0305   VLDL 19 06/12/2015 0305   LDLCALC 65 06/12/2015 0305   HgbA1c:  Lab Results  Component Value Date   HGBA1C 5.4 06/12/2015    Urine Drug Screen:    Component Value Date/Time   LABOPIA NONE DETECTED 06/21/2015 1115   COCAINSCRNUR NONE DETECTED 06/21/2015 1115   LABBENZ NONE DETECTED 06/21/2015 1115   AMPHETMU NONE DETECTED 06/21/2015 1115   THCU NONE DETECTED 06/21/2015 1115   LABBARB NONE DETECTED 06/21/2015 1115      IMAGING  Dg Chest 2 View 06/21/2015   No active cardiopulmonary disease.   Ct Head Wo Contrast 06/21/2015   1. Expected evolution of scattered right MCA cortical infarcts seen on 06/12/2015 MRI. 2. No new intracranial abnormality identified.   Ct Angio Neck W/cm &/or Wo/cm 06/22/2015   1. Nonocclusive intraluminal thrombus within the proximal right ICA beginning at the right carotid bifurcation as above. Finding is most certainly the embolic source of recently identified right cerebral infarcts. 2. Calcified and noncalcified plaque about the left carotid bifurcation without significant stenosis. 3. Widely patent vertebral arteries.   Mr Brain Wo Contrast 06/21/2015   Improving areas of subacute infarction in the right MCA territory compared with 06/12/2015. Interval development of mild amount of hemorrhage in the right posterior insular infarct and in the right posterior parietal lobe which appear to have occurred since the prior MRI. No acute ischemic infarction.   TEE 06/14/15 - Left ventricle: Systolic function was normal. The estimated ejection  fraction was in the range of 55% to 60%. Wall motion was normal; there were no regional wall motion abnormalities. - Left atrium: No evidence of thrombus in the atrial cavity or appendage. - Right atrium: No evidence of thrombus in the atrial cavity or appendage. - Atrial septum: Redundant and mildly mobile but negative bubble study Impressions: - No cardiac source of emboli was indentified.  CUS 06/12/15 - Findings suggest low range 40-59% right internal carotid artery stenosis and 1-39% left internal carotid artery stenosis.  Vertebral arteries are patent with antegrade flow.  PHYSICAL EXAM Physical exam  Temp:  [97.9 F (36.6 C)-98.8 F (37.1 C)] 98.7 F (37.1 C) (11/09 1507) Pulse Rate:  [74-85] 78 (11/09 1507) Resp:  [16-18] 18 (11/09 1507) BP: (117-137)/(67-89) 134/74 mmHg (11/09 1507) SpO2:  [96 %-100 %] 100 % (11/09 1507)  General - Well nourished, well developed, in no apparent distress.  Ophthalmologic - Sharp disc margins OU.   Cardiovascular - Regular rate and rhythm with no murmur.  Mental Status -  Level of arousal and orientation to time, place, and person were intact. Language including expression, naming, repetition, comprehension was assessed and found intact. Fund of Knowledge was assessed and was intact.  Cranial Nerves II - XII - II - Visual field intact OU. III, IV, VI - Extraocular movements intact. V - Facial sensation intact bilaterally. VII - Facial movement intact bilaterally. VIII - Hearing & vestibular intact bilaterally. X - Palate elevates symmetrically. XI - Chin turning & shoulder shrug intact bilaterally. XII - Tongue protrusion intact.  Motor Strength - The patient's strength was normal in all extremities except left hand dexterity difficulty and pronator drift was absent.  Bulk was normal and fasciculations were absent.   Motor Tone - Muscle tone was assessed at the neck and appendages and was normal.  Reflexes - The patient's reflexes were 1+ in all extremities and she had no pathological reflexes.  Sensory - Light touch, temperature/pinprick, vibration and proprioception, and Romberg testing were assessed and were symmetrical.    Coordination - The patient had normal movements in the hands and feet with no ataxia or dysmetria.  Tremor was absent.  Gait and Station - deferred to PT due to safety concerns.   ASSESSMENT/PLAN Ms. Tina Sharp is a 49 y.o. female with history of cryptogenic stroke within past 2 weeks and  gastroesophageal reflux disease  and invasive lobular carcinoma presenting with worsening of previous symptoms of slurred speech, L facial weakness and L arm weakness. She did not receive IV t-PA due to recent stroke.   Recrudescence of R MCA infarct symptoms in setting of nausea and vomiting Previous R MCA infarct thromboembolic was found to be secondary to nonocclusive R ICA thrombus at bifurcation. At risk for additional infarcts  Resultant  No new neuro deficits, now with mild left hand dexterity difficulty, glove-like numbness left size  MRI  No new stroke. Expected evolution with hemorrhagic transformation of previous R MCA infarct  CTA neck R ICA nonocclusive intraluminal thrombus at bifurcation. L ICA plaque without stenosis  LDL 65 06/12/2015, at goal  HgbA1c 5.4 in Oct  Lovenox 40 mg sq daily for VTE prophylaxis  Loop interrogated in the ED. No abnormalities. [employer] documentation not present in paper chart. Have requested print out.  Diet Heart Room service appropriate?: Yes; Fluid consistency:: Thin  aspirin 325 mg daily prior to admission, now on aspirin 325 mg daily. Given ICA thrombus, change to warfarin with lovenox bridge. stop aspirin.  Patient counseled to be compliant with her antithrombotic medications  Ongoing aggressive stroke risk factor management  Therapy recommendations:  pending   Disposition:  pending   Ok for discharge from stroke standpoint once anticoagulation started  Follow up Dr. Leonie Man in 2 months. Order written.  Other Stroke Risk Factors  Cigarette smoker, advised to stop smoking. homocysteine 36.2 probably related to cigarette smoking. Started on nicotine patch 7 mg daily at her request (she states she is decreasing amt she smokes)  ETOH use  Hx stroke/TIA  06/11/2015 cryptogenci R brain infarcts s/p IV tPA and loop placement 06/14/2015. Interrogated in the ED without abnormalities seen. Hypercoagulable panel neg except for homocysteine 36.2    Hyperhomocysteine  Mildly elevated at 14.2  Started on folic acid, vit B 6, vit B 12  Other Active Problems  Breast cancer June 2015 s/p mastectomy with left breast augmentation. Cannot exclude possible hypercoagulable etiology.  Macrocytosis, chronic and stable, Heme/onc following  Hospital day # [place] for Pager information 06/22/2015 11:18 AM   I, the attending vascular neurologist, have personally obtained a history, examined the patient, evaluated laboratory data, individually viewed imaging studies and agree with radiology interpretations. I obtained additional history from pt's daughter at bedside. I also discussed with daughter and Dr. Coralyn Pear regarding her care plan. Together with the NP/PA, we formulated the assessment and plan of care which reflects our mutual decision.  I have made any additions or clarifications directly to the above note and agree with the findings and plan as currently documented.   49 yo F with hx of HTN, smoker, breast cancer admitted on 06/11/15 for right MCA embolic stroke. MRA neg. CUS showed right ICA 40-59% stenosis. TEE negative and loop recorder placed. Re-admitted for recrudescence of stroke symptoms due to N/V. However, due to previous right ICA 40-59% stenosis, CTA neck was done, found to have right ICA thrombus. Put on anticoagulation. She needs repeat CTA neck in 3 months and decide on further plan of anticoagulation.   Neurology will sign off. Please call with questions. Pt will follow up with Dr. Leonie Man at [facility] in about 2 months. Thanks for the consult.   Rosalin Hawking, MD PhD Stroke Neurology 06/22/2015 9:33 PM       To contact Stroke Continuity provider, please refer to [email or web address] After hours, contact General Neurology

## 2015-06-23 ENCOUNTER — Telehealth: Payer: Self-pay | Admitting: *Deleted

## 2015-06-23 ENCOUNTER — Other Ambulatory Visit: Payer: Self-pay | Admitting: Internal Medicine

## 2015-06-23 LAB — HEMOGLOBIN A1C
Hgb A1c MFr Bld: 5.4 % (ref 4.8–5.6)
Mean Plasma Glucose: 108 mg/dL

## 2015-06-23 NOTE — Telephone Encounter (Signed)
Pt left 2 msgs on triage line. Patient states that "I had another stroke recently. I was given TPA. I had to go to [facility]. I need to guidance on what to do with my arimidex. Has Dr. B talked to Dr. Antony Contras my stroke doctor?  Attempted to call patient back on 06/23/15 at 1630. No answer. Left msg for pt to call cancer center.  RN spoke

## 2015-06-27 ENCOUNTER — Other Ambulatory Visit: Payer: Self-pay | Admitting: Internal Medicine

## 2015-06-27 ENCOUNTER — Telehealth: Payer: Self-pay | Admitting: Internal Medicine

## 2015-06-27 ENCOUNTER — Ambulatory Visit: Payer: BLUE CROSS/BLUE SHIELD | Admitting: Occupational Therapy

## 2015-06-27 DIAGNOSIS — G8194 Hemiplegia, unspecified affecting left nondominant side: Secondary | ICD-10-CM

## 2015-06-27 DIAGNOSIS — R201 Hypoesthesia of skin: Secondary | ICD-10-CM

## 2015-06-27 DIAGNOSIS — I6980 Unspecified sequelae of other cerebrovascular disease: Secondary | ICD-10-CM | POA: Diagnosis not present

## 2015-06-27 DIAGNOSIS — IMO0002 Reserved for concepts with insufficient information to code with codable children: Secondary | ICD-10-CM

## 2015-06-27 DIAGNOSIS — M6281 Muscle weakness (generalized): Secondary | ICD-10-CM

## 2015-06-27 NOTE — Patient Instructions (Signed)
  Coordination Activities  Perform the following activities for 5-10  minutes 3 times per day with left hand(s).   Flip cards 1 at a time as fast as you can.  Deal cards with your thumb (Hold deck in hand and push card off top with thumb).  Rotate card in hand (clockwise and counter-clockwise).  Shuffle cards.  Pick up coins, buttons, marbles, dried beans/pasta of different sizes and place in container.  Pick up coins and stack.  Pick up coins one at a time until you get 5-10 in your hand, then move coins from palm to fingertips to stack one at a time.  Twirl pen between fingers.

## 2015-06-27 NOTE — Therapy (Signed)
[facility] [address] [place], [place], [address] Phone: [phone]   Fax:  [phone]  Occupational Therapy Treatment  Patient Details  Name: Tina Sharp MRN: [id number] Date of Birth: 11-25-1965 Referring Provider: Dr. Leonie Man  Encounter Date: 06/27/2015      OT End of Session - 06/27/15 1107    Visit Number 2   Number of Visits 16   Date for OT Re-Evaluation 08/11/15   Authorization Type [employer] - awaiting clarification of specifics of plan   OT Start Time 1015   OT Stop Time 1101   OT Time Calculation (min) 46 min      Past Medical History  Diagnosis Date  . GERD (gastroesophageal reflux disease)   . Cancer [facility]) June 2015    Invasive lobular carcinoma, 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 ER/ PR+; Her 2 neu not overexpressed, microscopic positive margin (skin).  . Hypertension   . Depression   . Anxiety   . Stroke ([place]) 06/11/2015    cerebrum, cryptogenic right brain infarcts s/p IV TPA    Past Surgical History  Procedure Laterality Date  . Laparoscopic hysterectomy  2010    [facility]  . Breast surgery Left 04/08/14    mastectomy  . Reduction mammoplasty Right 04/08/14    Dr Tula Nakayama  . Colonoscopy    . Ep implantable device N/A 06/14/2015    Procedure: Loop Recorder Insertion;  Surgeon: Evans Lance, MD;  Location: [place] CV LAB;  Service: Cardiovascular;  Laterality: N/A;  . Tee without cardioversion N/A 06/14/2015    Procedure: TRANSESOPHAGEAL ECHOCARDIOGRAM (TEE);  Surgeon: Josue Hector, MD;  Location: [facility] ENDOSCOPY;  Service: Cardiovascular;  Laterality: N/A;  . Abdominal hysterectomy  2000    left both ovaries in  . Tonsillectomy and adenoidectomy      pt was 49 years old    There were no vitals filed for this visit.  Visit Diagnosis:  Lack of coordination due to stroke  Impaired sensation  Hemiplegia affecting left nondominant side (HCC)  Muscle weakness      Subjective  Assessment - 06/27/15 1025    Subjective  Headaches have kind of went away.     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 No/denies   Pain Score 0-No pain                      OT Treatments/Exercises (OP) - 06/27/15 0001    Exercises   Exercises Hand   Hand Exercises   Theraputty - Flatten yellow   Theraputty - Roll yellow   Theraputty - Grip yellow   Theraputty - Pinch yellow   Fine Motor Coordination   Flipping cards verbal cueing to reduce shoulder elevation   Dealing card with thumb cueing to rest forearm, elbow on table to reduce proximal overflow   Neurological Re-education Exercises   Other Exercises 1 Neuromuscular reeducation to address relationship of  arm, forearm, wrist, and hand for functional activities.  Patient with tendency to overactivate in left hand and shoulder for distal function.  Grading muscle activity also problematic, however patient able to follow cueing to use vision to compensate, and also to use right UE as a model for more typical motor recruitment.                  OT Education - 06/27/15 1107    Education provided Yes  Education Details Initiatied HEP for fine motor coordination exercises   Person(s) Educated Patient   Methods Explanation;Demonstration   Comprehension Verbalized understanding;Returned demonstration          OT Short Term Goals - 06/27/15 1113    OT SHORT TERM GOAL #1   Title Pt will be mod I with HEP - 07/14/2015   Status On-going   OT SHORT TERM GOAL #2   Title Pt will be able to button and zipper clothing with increased time.   Status On-going   OT SHORT TERM GOAL #3   Title Pt will demonstrate improved grip strength by at least 5 pounds to assist with functional tasks (baseline=15 pounds)   Status On-going   OT SHORT TERM GOAL #4   Title Pt will demonstrate improved fine motor coordination as evidenced by decreasing time on 9 hole peg test by at least 8  seconds (baseline= 54.48)   Status On-going   OT SHORT TERM GOAL #5   Title Pt will demonstrate ability to pick up small objects with mod compensations, min dropping and increased time.    Status On-going   OT SHORT TERM GOAL #6   Title Pt will require no more than min vc's to use vision to assist in compensation of use of L hand during functional tasks.    Status On-going           OT Long Term Goals - 06/27/15 1114    OT LONG TERM GOAL #1   Title Pt will be mod I with upgraded HEP - 12.29/2016   Status On-going   OT LONG TERM GOAL #2   Title Pt will demonstrate increased grip strength by at least 10 pounds to assist with functional tasks (baseline= 15 pounds)   Status On-going   OT LONG TERM GOAL #3   Title Pt will demonstrate improved coordinaton as evidenced by decreasing time on 9 hole peg test by at least 15 seconds (baseline= 54.48)   Status On-going   OT LONG TERM GOAL #4   Title Pt will be I with tying shoes using both hands   Status On-going   OT LONG TERM GOAL #5   Title Pt will be able to use LUE as non dominant assist for basic self care at least 75% of the time   Status On-going   OT LONG TERM GOAL #6   Title Pt will be able to orient hand approrpriately and manipulate small objects in left hand during functional tasks with min compensations.    Status On-going   OT LONG TERM GOAL #7   Title Pt will be able to complete bilateral tasks with min compensations   Status On-going   OT LONG TERM GOAL #8   Title Pt will be able to type 4 sentence paragrah with at least 75% accuracy using both hands   Status On-going               Plan - 06/27/15 1108    Clinical Impression Statement Reviewed short and long term goals, and patient in agreement.  Patient plans to see neurology later this month, and hopes then to have a clearer picture of return to work timeline.  Patient with increased  tension with all attempts to use left hand, but responded well to verbal  cueing to use vision and right hand as guidance to left hand.     Pt will benefit from skilled therapeutic intervention in order to improve on the following deficits (Retired)  Decreased activity tolerance;Decreased coordination;Decreased knowledge of use of DME;Decreased strength;Impaired UE functional use;Impaired sensation;Pain   OT Frequency 2x / week   OT Duration 8 weeks   OT Treatment/Interventions Self-care/ADL training;Moist Heat;Electrical Stimulation;Fluidtherapy;DME and/or AE instruction;Neuromuscular education;Therapeutic exercise;Manual Therapy;Splinting;Therapeutic activities;Patient/family education   Plan HEP for theraputty, neuromuscular reed to left UE - forearm, wrist, hand   Consulted and Agree with Plan of Care Patient        Problem List Patient Active Problem List   Diagnosis Date Noted  . CVA (cerebral infarction) 06/21/2015  . TIA (transient ischemic attack) 06/21/2015  . Macrocytic anemia 06/20/2015  . Headache 06/14/2015  . Cigarette smoker 06/14/2015  . Stroke (cerebrum) ([place]) cryptogenic R brain infarcts s/p IV tPA  06/11/2015  . Malignant neoplasm of breast (female), unspecified site 04/23/2014  . Breast cancer ([place]) 02/20/2014    Marlowe Sax M,OTR/L 06/27/2015, 11:16 AM  [facility] [address] [place], [place], [address] Phone: [phone]   Fax:  [phone]  Name: MYONNA GOTH MRN: [id number] Date of Birth: 1966/03/08

## 2015-06-27 NOTE — Telephone Encounter (Signed)
Patient instructed to resume back taking the aromatase inhibitor. Reassured patient of Dr. Clydene Fake recommendations.  Teach back process performed.

## 2015-06-27 NOTE — Telephone Encounter (Signed)
Spoke to Dr. Leonie Man; patient's neurologist. Discussed the pros and cons of aromatase inhibitor. Patient is currently on anticoagulation as per Dr. Leonie Man. Okay to proceed with aromatase inhibitor at this time as aromatase inhibitor is an unlikely cause of for her stroke.   Please inform patient of above recommendations.

## 2015-06-30 ENCOUNTER — Encounter: Payer: Self-pay | Admitting: Occupational Therapy

## 2015-06-30 ENCOUNTER — Ambulatory Visit: Payer: BLUE CROSS/BLUE SHIELD | Admitting: Occupational Therapy

## 2015-06-30 VITALS — BP 158/112 | HR 95

## 2015-06-30 DIAGNOSIS — G8194 Hemiplegia, unspecified affecting left nondominant side: Secondary | ICD-10-CM

## 2015-06-30 DIAGNOSIS — R201 Hypoesthesia of skin: Secondary | ICD-10-CM

## 2015-06-30 DIAGNOSIS — IMO0002 Reserved for concepts with insufficient information to code with codable children: Secondary | ICD-10-CM

## 2015-06-30 DIAGNOSIS — I6980 Unspecified sequelae of other cerebrovascular disease: Secondary | ICD-10-CM | POA: Diagnosis not present

## 2015-06-30 NOTE — Therapy (Signed)
[facility] [address] [place], [place], [address] Phone: [phone]   Fax:  [phone]  Occupational Therapy Treatment  Patient Details  Name: Tina Sharp MRN: [id number] Date of Birth: June 11, 1966 Referring Provider: Dr. Leonie Man  Encounter Date: 06/30/2015      OT End of Session - 06/30/15 1331    Visit Number 3   Number of Visits 16   Date for OT Re-Evaluation 08/11/15   Authorization Type BCBS - awaiting clarification of specifics of plan   OT Start Time 1019   OT Stop Time 1102   OT Time Calculation (min) 43 min   Activity Tolerance Patient tolerated treatment well   Behavior During Therapy [facility] for tasks assessed/performed      Past Medical History  Diagnosis Date  . GERD (gastroesophageal reflux disease)   . Cancer [facility]) June 2015    Invasive lobular carcinoma, 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 ER/ PR+; Her 2 neu not overexpressed, microscopic positive margin (skin).  . Hypertension   . Depression   . Anxiety   . 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 Laparoscopic hysterectomy  2010    [facility]  . Breast surgery Left 04/08/14    mastectomy  . Reduction mammoplasty Right 04/08/14    Dr Tula Nakayama  . Colonoscopy    . Ep implantable device N/A 06/14/2015    Procedure: Loop Recorder Insertion;  Surgeon: Evans Lance, MD;  Location: [place] CV LAB;  Service: Cardiovascular;  Laterality: N/A;  . Tee without cardioversion N/A 06/14/2015    Procedure: TRANSESOPHAGEAL ECHOCARDIOGRAM (TEE);  Surgeon: Josue Hector, MD;  Location: [facility] ENDOSCOPY;  Service: Cardiovascular;  Laterality: N/A;  . Abdominal hysterectomy  2000    left both ovaries in  . Tonsillectomy and adenoidectomy      pt was 49 years old    Filed Vitals:   06/30/15 1023 06/30/15 1036  BP: 159/118 158/112  Pulse:  95   * Patient with  headache and elevated BP today.  Headache is constant on right side.  BP decreased as she sat at rest, although still elevated.   Patient had not yet taken headache medication.  Patient encouraged to limit activity today, and call MD with BP and pulse rates,as well as describe continuous right sided headache.   Patient in agreement.  Visit Diagnosis:  Lack of coordination due to stroke  Impaired sensation  Hemiplegia affecting left nondominant side (HCC)      Subjective Assessment - 06/30/15 1023    Subjective  They are trying to get my blood thinner therapeutic   Pertinent History see epic. 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 Yes   Pain Score 5    Pain Location Head   Pain Orientation Right   Pain Descriptors / Indicators Aching   Pain Type Acute pain            [facility] OT Assessment - 06/30/15 0001    Hand Function   Left Hand Gross Grasp Impaired   Left Hand Grip (lbs) 12,14,15                  OT Treatments/Exercises (OP) - 06/30/15 0001    Fine Motor Coordination  Fine Motor Coordination In hand manipuation training;Small Pegboard;Manipulation of small objects   Grooved pegs With forearm resting on table patient able to complete grooved pegboard with improved motor pattern, decreased trunk and shoulder substitution.  Patient is using vision and right hand movement to help grade movement of left hand.  Patient able to orient hand with wrist and forearm/elbow to manipulate pegs into place.     Other Fine Motor Exercises Practiced typing for the first time since stroke.  Patient with reduced power in 4th and 5th didgit, and lacked consistent accuracy of left hand, but was overall pleased with her ability to begin to type.  Patient encouraged to type for brief periods at home, and issued handout of left handed workds to type.                   OT Education - 06/30/15 1330    Education provided Yes   Education Details Added to HEP - patient given left handed typing exercise for home.  Encouraged to work no more than 5 min at a time to reduce potential for frustration   Person(s) Educated Patient   Methods Explanation;Demonstration   Comprehension Verbalized understanding;Returned demonstration          OT Short Term Goals - 06/27/15 1113    OT SHORT TERM GOAL #1   Title Pt will be mod I with HEP - 07/14/2015   Status On-going   OT SHORT TERM GOAL #2   Title Pt will be able to button and zipper clothing with increased time.   Status On-going   OT SHORT TERM GOAL #3   Title Pt will demonstrate improved grip strength by at least 5 pounds to assist with functional tasks (baseline=15 pounds)   Status On-going   OT SHORT TERM GOAL #4   Title Pt will demonstrate improved fine motor coordination as evidenced by decreasing time on 9 hole peg test by at least 8 seconds (baseline= 54.48)   Status On-going   OT SHORT TERM GOAL #5   Title Pt will demonstrate ability to pick up small objects with mod compensations, min dropping and increased time.    Status On-going   OT SHORT TERM GOAL #6   Title Pt will require no more than min vc's to use vision to assist in compensation of use of L hand during functional tasks.    Status On-going           OT Long Term Goals - 06/27/15 1114    OT LONG TERM GOAL #1   Title Pt will be mod I with upgraded HEP - 12.29/2016   Status On-going   OT LONG TERM GOAL #2   Title Pt will demonstrate increased grip strength by at least 10 pounds to assist with functional tasks (baseline= 15 pounds)   Status On-going   OT LONG TERM GOAL #3   Title Pt will demonstrate improved coordinaton as evidenced by decreasing time on 9 hole peg test by at least 15 seconds (baseline= 54.48)   Status On-going   OT LONG TERM GOAL #4   Title Pt will be I with tying shoes using both hands   Status On-going   OT  LONG TERM GOAL #5   Title Pt will be able to use LUE as non dominant assist for basic self care at least 75% of the time   Status On-going   OT LONG TERM GOAL #6   Title Pt will be able to orient hand  approrpriately and manipulate small objects in left hand during functional tasks with min compensations.    Status On-going   OT LONG TERM GOAL #7   Title Pt will be able to complete bilateral tasks with min compensations   Status On-going   OT LONG TERM GOAL #8   Title Pt will be able to type 4 sentence paragrah with at least 75% accuracy using both hands   Status On-going               Plan - 06/30/15 1332    Clinical Impression Statement Patient is progressing nicely toward goals.  Patient has been using her home exercise program to address coordination of left hand.     Pt will benefit from skilled therapeutic intervention in order to improve on the following deficits (Retired) Decreased activity tolerance;Decreased coordination;Decreased knowledge of use of DME;Decreased strength;Impaired UE functional use;Impaired sensation;Pain   Rehab Potential Good   OT Frequency 2x / week   OT Duration 8 weeks   OT Treatment/Interventions Self-care/ADL training;Moist Heat;Electrical Stimulation;Fluidtherapy;DME and/or AE instruction;Neuromuscular education;Therapeutic exercise;Manual Therapy;Splinting;Therapeutic activities;Patient/family education   Plan HEP for therputty, NMR to left UE - Forearm, wrist, hand   Consulted and Agree with Plan of Care Patient        Problem List Patient Active Problem List   Diagnosis Date Noted  . CVA (cerebral infarction) 06/21/2015  . TIA (transient ischemic attack) 06/21/2015  . Macrocytic anemia 06/20/2015  . Headache 06/14/2015  . Cigarette smoker 06/14/2015  . Stroke (cerebrum) ([place]) cryptogenic R brain infarcts s/p IV tPA  06/11/2015  . Malignant neoplasm of breast (female), unspecified site 04/23/2014  . Breast cancer ([place]) 02/20/2014     Tina Sharp, Tina Sharp 06/30/2015, 1:44 PM  [facility] [address] [place], [place], [address] Phone: [phone]   Fax:  [phone]  Name: Tina Sharp MRN: [id number] Date of Birth: [birth year]

## 2015-06-30 NOTE — Telephone Encounter (Signed)
1700- 06/22/16-Spoke with patient's mom. Explained to have patient stop her arimidex until further notice by Dr. Rogue Bussing. Explained to patient's mom that I could not reach the patient. 1710 06/22/16-patient returned phone call to RN. Rn educated patient to stop arimidex. I will notify her next week once Dr. Rogue Bussing has spoken to the neurologist.

## 2015-07-01 LAB — ALPHA GALACTOSIDASE: ALPHA GALACTOSIDASE, SERUM: 41.8 nmol/h/mg{prot} (ref 28.0–80.0)

## 2015-07-04 ENCOUNTER — Ambulatory Visit: Payer: BLUE CROSS/BLUE SHIELD | Admitting: Occupational Therapy

## 2015-07-06 ENCOUNTER — Ambulatory Visit: Payer: BLUE CROSS/BLUE SHIELD | Admitting: Neurology

## 2015-07-06 ENCOUNTER — Telehealth: Payer: Self-pay | Admitting: Neurology

## 2015-07-06 ENCOUNTER — Ambulatory Visit: Payer: BLUE CROSS/BLUE SHIELD | Admitting: Occupational Therapy

## 2015-07-06 NOTE — Telephone Encounter (Signed)
Rn also gave patient fax number for [employer] to fax forms for job.

## 2015-07-06 NOTE — Telephone Encounter (Signed)
Agree with plan 

## 2015-07-06 NOTE — Telephone Encounter (Signed)
Pt did not make her appt today and called to r/s. She will be able to come in Jan. 31 at [facility]. She, however , needs a letter stating she can not go back to work. She was given one while in the hospital stating she is out of work from Oct. 29th-Nov.29th. She is unable to use her left hand and has to in order to do her work.She works at [employer] and uses a Teaching laboratory technician daily, typing. Please call and advise

## 2015-07-06 NOTE — Telephone Encounter (Signed)
Rn call patient about letter. Rn explain that Dr. Leonie Man cannot do a letter for her because she cancel her appt today without a notice. Pt had appt with Dr.Sethi today at 0930 and cancel because she had a fever. Rn explain Dr. Leonie Man stated he cannot do a letter putting her out of work till September 13 2015. Rn stated Dr.Sethi requested she get a sooner appt. Rn schedule her with Dr. Erlinda Hong on 07-12-15 at 0100pm and told her to arrive at 1245pm. Rn explain a letter cannot be written to have her out for 3 months. Rn requested she get FMLA forms from her job for [place] to filled out. Rn explain there is a 25.00 fee and a release form she has to filled out. Pt understands and will keep her appt on 07-12-15. Pt will see Dr Erlinda Hong for first follow up visit and than follow up with Dr.Sethi.

## 2015-07-11 ENCOUNTER — Ambulatory Visit: Payer: BLUE CROSS/BLUE SHIELD | Admitting: Occupational Therapy

## 2015-07-11 ENCOUNTER — Encounter: Payer: Self-pay | Admitting: Occupational Therapy

## 2015-07-11 DIAGNOSIS — I6980 Unspecified sequelae of other cerebrovascular disease: Secondary | ICD-10-CM | POA: Diagnosis not present

## 2015-07-11 DIAGNOSIS — M6281 Muscle weakness (generalized): Secondary | ICD-10-CM

## 2015-07-11 DIAGNOSIS — R201 Hypoesthesia of skin: Secondary | ICD-10-CM

## 2015-07-11 DIAGNOSIS — G8194 Hemiplegia, unspecified affecting left nondominant side: Secondary | ICD-10-CM

## 2015-07-11 DIAGNOSIS — IMO0002 Reserved for concepts with insufficient information to code with codable children: Secondary | ICD-10-CM

## 2015-07-11 NOTE — Patient Instructions (Signed)
Theraputty exercises:  Using red color putty:  Do these 1-2 times per day. STOP if you get pain and let your therapist know.  1. Make a ball     Make a pancake     Make a cone Do this sequence 3 times  2. Make a fat hot dog. Squeeze as hard as you can. Do this 10 times 3. Ring around the fingers.  Do this 10 times. 5. Make a snake:  2 pt pinch  3 pt pinch  Lateral pinch  Do these 2 times.

## 2015-07-11 NOTE — Therapy (Signed)
[facility] [address] [place], [place], [address] Phone: [phone]   Fax:  [phone]  Occupational Therapy Treatment  Patient Details  Name: Tina Sharp MRN: [id number] Date of Birth: 07/28/1966 Referring Provider: Dr. Leonie Man  Encounter Date: 07/11/2015      OT End of Session - 07/11/15 1315    Visit Number 5   Number of Visits 16   Date for OT Re-Evaluation 08/11/15   Authorization Type BCBS - 30 visit limiit for OT   OT Start Time 1101   OT Stop Time 1144   OT Time Calculation (min) 43 min   Activity Tolerance Patient tolerated treatment well      Past Medical History  Diagnosis Date  . GERD (gastroesophageal reflux disease)   . Cancer 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 ER/ PR+; Her 2 neu not overexpressed, microscopic positive margin (skin).  . Hypertension   . Depression   . Anxiety   . 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 Laparoscopic hysterectomy  2010    [facility]  . Breast surgery Left 04/08/14    mastectomy  . Reduction mammoplasty Right 04/08/14    Dr Tula Nakayama  . Colonoscopy    . Ep implantable device N/A 06/14/2015    Procedure: Loop Recorder Insertion;  Surgeon: Evans Lance, MD;  Location: [place] CV LAB;  Service: Cardiovascular;  Laterality: N/A;  . Tee without cardioversion N/A 06/14/2015    Procedure: TRANSESOPHAGEAL ECHOCARDIOGRAM (TEE);  Surgeon: Josue Hector, MD;  Location: [facility] ENDOSCOPY;  Service: Cardiovascular;  Laterality: N/A;  . Abdominal hysterectomy  2000    left both ovaries in  . Tonsillectomy and adenoidectomy      pt was 49 years old    There were no vitals filed for this visit.  Visit Diagnosis:  Lack of coordination due to stroke  Impaired sensation  Hemiplegia affecting left nondominant side (HCC)  Muscle  weakness      Subjective Assessment - 07/11/15 1106    Subjective   I have been very sick.    Pertinent History see epic. 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 No/denies                      OT Treatments/Exercises (OP) - 07/11/15 0001    ADLs   ADL Comments Checked STG's and reviewed with pt - see goal section for updated   Exercises   Exercises Hand   Hand Exercises   Theraputty - Flatten red   Theraputty - Roll red   Theraputty - Grip red   Theraputty - Pinch red   Other Hand Exercises Pt issued upgraded HEP to incorporate red putty. See pt instruction for detail. Pt able to return demonstrate all exercises after demonstration, vc's and repetition. Pt requires increased time due to incoordination.                  OT Education - 07/11/15 1144    Education provided Yes   Education Details theraputty exercises with red putty   Person(s) Educated Patient   Methods Explanation;Demonstration;Verbal cues;Handout   Comprehension Verbalized understanding;Returned demonstration  OT Short Term Goals - 07/11/15 1311    OT SHORT TERM GOAL #1   Title Pt will be mod I with HEP - 07/21/2015 (adjusted as pt missed one week of theraputty due to being ill)   Status On-going   OT SHORT TERM GOAL #2   Title Pt will be able to button and zipper clothing with increased time.   Status Achieved   OT SHORT TERM GOAL #3   Title Pt will demonstrate improved grip strength by at least 5 pounds to assist with functional tasks (baseline=15 pounds)   Status Achieved  30 pounds   OT SHORT TERM GOAL #4   Title Pt will demonstrate improved fine motor coordination as evidenced by decreasing time on 9 hole peg test by at least 8 seconds (baseline= 54.48)   Status Achieved  45.2   OT SHORT TERM GOAL #5   Title Pt will demonstrate ability to pick up small  objects with mod compensations, min dropping and increased time.    Status Achieved   OT SHORT TERM GOAL #6   Title Pt will require no more than min vc's to use vision to assist in compensation of use of L hand during functional tasks.    Status On-going           OT Long Term Goals - 07/11/15 1313    OT LONG TERM GOAL #1   Title Pt will be mod I with upgraded HEP - 12.29/2016   Status On-going   OT LONG TERM GOAL #2   Title Pt will demonstrate increased grip strength by at least 25 pounds to assist with functional tasks (baseline= 15 pounds)   Status Revised   OT LONG TERM GOAL #3   Title Pt will demonstrate improved coordinaton as evidenced by decreasing time on 9 hole peg test by at least 15 seconds (baseline= 54.48)   Status On-going   OT LONG TERM GOAL #4   Title Pt will be I with tying shoes using both hands   Status On-going   OT LONG TERM GOAL #5   Title Pt will be able to use LUE as non dominant assist for basic self care at least 75% of the time   Status On-going   OT LONG TERM GOAL #6   Title Pt will be able to orient hand approrpriately and manipulate small objects in left hand during functional tasks with min compensations.    Status On-going   OT LONG TERM GOAL #7   Title Pt will be able to complete bilateral tasks with min compensations   Status On-going   OT LONG TERM GOAL #8   Title Pt will be able to type 4 sentence paragrah with at least 75% accuracy using both hands   Status On-going               Plan - 07/11/15 1314    Clinical Impression Statement Pt has missed a week of therapy due to being ill but has been doing HEP and is progressing toward goals. Pt with improved grip strength, coordination and functional use of hand.   Pt will benefit from skilled therapeutic intervention in order to improve on the following deficits (Retired) Decreased activity tolerance;Decreased coordination;Decreased knowledge of use of DME;Decreased strength;Impaired UE  functional use;Impaired sensation;Pain   Rehab Potential Good   OT Frequency 2x / week   OT Duration 8 weeks   OT Treatment/Interventions Self-care/ADL training;Moist Heat;Electrical Stimulation;Fluidtherapy;DME and/or AE instruction;Neuromuscular education;Therapeutic exercise;Manual Therapy;Splinting;Therapeutic activities;Patient/family education  Plan check HEP, NMR to LUE - forearm, wrist hand, typing, strength and coordination   Consulted and Agree with Plan of Care Patient        Problem List Patient Active Problem List   Diagnosis Date Noted  . CVA (cerebral infarction) 06/21/2015  . TIA (transient ischemic attack) 06/21/2015  . Macrocytic anemia 06/20/2015  . Headache 06/14/2015  . Cigarette smoker 06/14/2015  . Stroke (cerebrum) ([place]) cryptogenic R brain infarcts s/p IV tPA  06/11/2015  . Malignant neoplasm of breast (female), unspecified site 04/23/2014  . Breast cancer ([place]) 02/20/2014    Quay Burow, OTR/L 07/11/2015, 2:24 PM  [facility] [address] [place], [place], [address] Phone: [phone]   Fax:  [phone]  Name: AVIANAH STAHR MRN: [id number] Date of Birth: 01/04/1966

## 2015-07-11 NOTE — Telephone Encounter (Signed)
RN call patient about her appt tomorrow at 0100pm with Dr. Erlinda Hong. Pt will follow up with Dr.Sethi after seeing Dr. Erlinda Hong for a initial hospital visit. Rn told patient to arrive between 1245 and 1250 for check in. Pt verbalized and confirm she will be at the appt. Rn goes to therapy next door at neuro rehab and was told to just follow the signs for [place] office in the same building.

## 2015-07-12 ENCOUNTER — Telehealth: Payer: Self-pay | Admitting: Neurology

## 2015-07-12 ENCOUNTER — Ambulatory Visit (INDEPENDENT_AMBULATORY_CARE_PROVIDER_SITE_OTHER): Payer: BLUE CROSS/BLUE SHIELD | Admitting: Neurology

## 2015-07-12 ENCOUNTER — Encounter: Payer: Self-pay | Admitting: Neurology

## 2015-07-12 VITALS — Ht 62.0 in | Wt 142.2 lb

## 2015-07-12 DIAGNOSIS — E785 Hyperlipidemia, unspecified: Secondary | ICD-10-CM

## 2015-07-12 DIAGNOSIS — G44209 Tension-type headache, unspecified, not intractable: Secondary | ICD-10-CM | POA: Diagnosis not present

## 2015-07-12 DIAGNOSIS — I639 Cerebral infarction, unspecified: Secondary | ICD-10-CM | POA: Diagnosis not present

## 2015-07-12 DIAGNOSIS — E7211 Homocystinuria: Secondary | ICD-10-CM | POA: Diagnosis not present

## 2015-07-12 MED ORDER — TRAMADOL HCL 50 MG PO TABS: 25.0000 mg | ORAL_TABLET | Freq: Four times a day (QID) | ORAL | Status: DC | PRN

## 2015-07-12 MED ORDER — VITAMIN B-6 25 MG PO TABS: 25.0000 mg | ORAL_TABLET | Freq: Every day | ORAL | Status: DC

## 2015-07-12 NOTE — Patient Instructions (Addendum)
-   continue coumadin for stroke prevention - INR goal 2-3 - continue Folic acid, 123456 and B6 for hyperhomocysteinemia treatment - Follow up with your primary care physician for stroke risk factor modification. Recommend maintain blood pressure goal <130/80, diabetes with hemoglobin A1c goal below 6.5% and lipids with LDL cholesterol goal below 70 mg/dL.  - need to repeat CTA neck in 2 months - quit smoking - follow up with loop recorder monitoring. - continue PT/OT and aggressive home exercise - will fill out [employer] form for you - follow up with Dr. Leonie Man in 2 months.

## 2015-07-12 NOTE — Progress Notes (Signed)
STROKE NEUROLOGY FOLLOW UP NOTE  NAME: Tina Sharp DOB: 11-30-1965  REASON FOR VISIT: stroke follow up HISTORY FROM: pt and chart  Today we had the pleasure of seeing Tina Sharp in follow-up at our Neurology Clinic. Pt was accompanied by sister.   History Summary Tina Sharp is a 49 y.o. female with history of GERD and invasive lobular breast carcinoma was admitted on 06/11/15 for acute onset of numbness of the left hemiparesis and headache with photophobia. She received IV TPA at [facility] and was transferred to [facility]. Her symptoms improved. 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 MRA showed irregular right M3 and M4 branches with right fetal PCA. CUS showed right ICA 40-59% stenosis. TTE and TEE unremarkable and loop recorder placed. LDL 65 and A1C 5.4 with negative hypercoagulable work up except homocysteine level 36.2. She was discharged with ASA 325mg .  She was readmitted on 06/21/15 for worsening left sided symptoms. MRI negative for new strokes but CTA head and neck showed right ICA thrombus. She was put on anticoagulation coumadin with lovenox bridge.  Discharged in good condition with outpt PT/OT. 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   Interval History During the interval time, the patient has been doing well. No recurrent stroke like symptoms. Continue following with outpt PT/OT and left hand getting better but still difficulty with fine motor activity. Still on coumadin but recently decreased dose due to INR.> 5.0. Loop recorder no afib found so far. Still not quit smoking yet. BP 152/93 today but pt stated that BP at home 120-140s. Currently has PT/OT twice a week.   REVIEW OF SYSTEMS: Full 14 system review of systems performed and notable only for those listed below and in HPI above, all others are negative:  Constitutional:   Cardiovascular:  Ear/Nose/Throat:   Skin:  Eyes:     Respiratory:   Gastroitestinal:   Genitourinary:  Hematology/Lymphatic:   Endocrine:  Musculoskeletal:   Allergy/Immunology:   Neurological:  Memory loss, HA, numbness, weakness Psychiatric:  Sleep:   The following represents the patient's updated allergies and side effects list: No Known Allergies  The neurologically relevant items on the patient's problem list were reviewed on today's visit.  Neurologic Examination  A problem focused neurological exam (12 or more points of the single system neurologic examination, vital signs counts as 1 point, cranial nerves count for 8 points) was performed.  Height 5\' 2"  (1.575 m), weight 142 lb 3.2 oz (64.501 kg).  General - Well nourished, well developed, in no apparent distress.  Ophthalmologic - Sharp disc margins OU.   Cardiovascular - Regular rate and rhythm with no murmur.  Mental Status -  Level of arousal and orientation to time, place, and person were intact. Language including expression, naming, repetition, comprehension was assessed and found intact. Fund of Knowledge was assessed and was intact.  Cranial Nerves II - XII - II - Visual field intact OU. III, IV, VI - Extraocular movements intact. V - Facial sensation intact bilaterally. VII - Facial movement intact bilaterally. VIII - Hearing & vestibular intact bilaterally. X - Palate elevates symmetrically. XI - Chin turning & shoulder shrug intact bilaterally. XII - Tongue protrusion intact.  Motor Strength - The patient's strength was normal in all extremities except left hand decreased grip and dexterity difficulty and pronator drift was absent.  Bulk was normal and fasciculations were absent.   Motor  Tone - Muscle tone was assessed at the neck and appendages and was normal.  Reflexes - The patient's reflexes were 1+ in all extremities and she had no pathological reflexes.  Sensory - Light touch, temperature/pinprick were assessed and were normal.     Coordination - The patient had normal movements in the hands and feet with no ataxia or dysmetria.  Tremor was absent.  Gait and Station - The patient's transfers, posture, gait, station, and turns were observed as normal.  Data reviewed: I personally reviewed the images and agree with the radiology interpretations.  Dg Chest 2 View 06/21/2015 No active cardiopulmonary disease.   Ct Head Wo Contrast 06/21/2015 1. Expected evolution of scattered right MCA cortical infarcts seen on 06/12/2015 MRI. 2. No new intracranial abnormality identified.   Ct Angio Neck W/cm &/or Wo/cm 06/22/2015 1. Nonocclusive intraluminal thrombus within the proximal right ICA beginning at the right carotid bifurcation as above. Finding is most certainly the embolic source of recently identified right cerebral infarcts. 2. Calcified and noncalcified plaque about the left carotid bifurcation without significant stenosis. 3. Widely patent vertebral arteries.   Mr Brain Wo Contrast 06/21/2015 Improving areas of subacute infarction in the right MCA territory compared with 06/12/2015. Interval development of mild amount of hemorrhage in the right posterior insular infarct and in the right posterior parietal lobe which appear to have occurred since the prior MRI. No acute ischemic infarction.   Mr Jodene Nam Head/brain Wo Cm 06/12/2015 No proximal flow reducing lesion is observed on MRA, but there is subtle irregularity and beading of the distal RIGHT MCA branches as compared to the LEFT.   2D Echocardiogram  - Left ventricle: The cavity size was normal. Wall thickness wasincreased in a pattern of mild LVH. Systolic function was normal.The estimated ejection fraction was in the range of 60% to 65%.Wall motion was normal; there were no regional wall motionabnormalities. Doppler parameters are consistent with abnormalleft ventricular relaxation (grade 1 diastolic dysfunction).  TEE 06/14/15 - Left ventricle: Systolic  function was normal. The estimated ejection fraction was in the range of 55% to 60%. Wall motion was normal; there were no regional wall motion abnormalities. - Left atrium: No evidence of thrombus in the atrial cavity or appendage. - Right atrium: No evidence of thrombus in the atrial cavity or appendage. - Atrial septum: Redundant and mildly mobile but negative bubble study Impressions: - No cardiac source of emboli was indentified.  CUS 06/12/15 - Findings suggest low range 40-59% right internal carotid artery stenosis and 1-39% left internal carotid artery stenosis. Vertebral arteries are patent with antegrade flow.  Component     Latest Ref Rng 06/12/2015 06/13/2015 06/22/2015  Cholesterol     0 - 200 mg/dL 129    Triglycerides     <150 mg/dL 93    HDL Cholesterol     >40 mg/dL 45    Total CHOL/HDL Ratio      2.9    VLDL     0 - 40 mg/dL 19    LDL (calc)     0 - 99 mg/dL 65    Alpha galactosidase, serum     28.0 - 80.0 nmol/hr/mg prt   41.8  Interpretation        Comment  Director Review        Comment  Methodology        Comment  PTT Lupus Anticoagulant     0.0 - 40.6 sec  28.7   DRVVT  0.0 - 44.0 sec  33.9   Lupus Anticoag Interp       Comment:   Beta-2 Glyco I IgG     0 - 20 GPI IgG units  <9   Beta-2-Glycoprotein I IgM     0 - 32 GPI IgM units  <9   Beta-2-Glycoprotein I IgA     0 - 25 GPI IgA units  <9   Anticardiolipin IgG     0 - 14 GPL U/mL  <9   Anticardiolipin IgM     0 - 12 MPL U/mL  <9   Anticardiolipin IgA     0 - 11 APL U/mL  <9   Hemoglobin A1C     4.8 - 5.6 % 5.4  5.4  Mean Plasma Glucose      108  108  Recommendations-F5LEID:       Comment   Comment       Comment   Recommendations-PTGENE:       Comment   Additional Information       Comment   Vitamin B-12     180 - 914 pg/mL  211 179 (L)  TSH     0.350 - 4.500 uIU/mL  1.633   ANA Ab, IFA       Negative   Sed Rate     0 - 22 mm/hr  7   Antithrombin Activity      75 - 120 %  109   Protein C Activity     73 - 180 %  118   Protein C, Total     60 - 150 %  87   Protein S Activity     63 - 140 %  139   Protein S Ag, Total     60 - 150 %  143   Homocysteine     0.0 - 15.0 umol/L  36.2 (H)   Folate     >5.9 ng/mL   3.9 (L)     Assessment: As you may recall, she is a 49 y.o. Caucasian female with PMH of GERD and invasive lobular breast carcinoma was admitted on 06/11/15 for scattered right MCA and PCA infarcts. She received tPA. MRA showed irregular right M3 and M4 branches with right fetal PCA. CUS showed right ICA 40-59% stenosis. TTE and TEE unremarkable and loop recorder placed. LDL 65 and A1C 5.4 with negative hypercoagulable work up except homocysteine level 36.2. She was discharged with ASA 325mg . She was readmitted on 06/21/15 for worsening left sided symptoms. MRI negative for new strokes but CTA head and neck showed right ICA thrombus. She was put on anticoagulation coumadin with lovenox bridge. 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 Still not quit smoking yet. Left hand dexterity difficulty improving. Still has intermittent HA, responding to tramadol well.  Plan:  - continue coumadin for stroke prevention - INR goal 2-3 - continue Folic acid, 123456 and B6 for hyperhomocysteinemia treatment - Follow up with your primary care physician for stroke risk factor modification. Recommend maintain blood pressure goal <130/80, diabetes with hemoglobin A1c goal below 6.5% and lipids with LDL cholesterol goal below 70 mg/dL.  - repeat CTA neck in 2 months with next visit with Dr. Leonie Man - quit smoking - follow up with loop recorder monitoring. - continue PT/OT and aggressive home exercise - follow up with Dr. Leonie Man in 2 months.  I spent more than 25 minutes of face to  face time with the patient. Greater than 50% of time was spent in counseling and coordination of care.   No orders of the defined types  were placed in this encounter.    Meds ordered this encounter  Medications  . vitamin B-6 (PYRIDOXINE) 25 MG tablet    Sig: Take 1 tablet (25 mg total) by mouth daily.    Dispense:  90 tablet    Refill:  3  . traMADol (ULTRAM) 50 MG tablet    Sig: Take 0.5 tablets (25 mg total) by mouth every 6 (six) hours as needed.    Dispense:  30 tablet    Refill:  2    Patient Instructions  - continue coumadin for stroke prevention - INR goal 2-3 - continue Folic acid, 123456 and B6 for hyperhomocysteinemia treatment - Follow up with your primary care physician for stroke risk factor modification. Recommend maintain blood pressure goal <130/80, diabetes with hemoglobin A1c goal below 6.5% and lipids with LDL cholesterol goal below 70 mg/dL.  - need to repeat CTA neck in 2 months - quit smoking - follow up with loop recorder monitoring. - continue PT/OT and aggressive home exercise - will fill out [employer] form for you - follow up with Dr. Leonie Man in 2 months.   Rosalin Hawking, MD PhD [facility] [address] [place], [place] [address] [phone]

## 2015-07-12 NOTE — Telephone Encounter (Signed)
Rn call patient back about her FMLA form. Pt stated she would like to return to work on 08-22-15. Pt stated she will be done with therapy on 08-12-16. Rn stated Dr. Erlinda Hong will put 08-22-15 on her FMLA form and it will be fax to human resources at [place].

## 2015-07-12 NOTE — Telephone Encounter (Signed)
Patient called, has a question about FMLA.

## 2015-07-13 DIAGNOSIS — Z0289 Encounter for other administrative examinations: Secondary | ICD-10-CM

## 2015-07-14 ENCOUNTER — Ambulatory Visit (INDEPENDENT_AMBULATORY_CARE_PROVIDER_SITE_OTHER): Payer: BLUE CROSS/BLUE SHIELD | Admitting: *Deleted

## 2015-07-14 ENCOUNTER — Encounter: Payer: Self-pay | Admitting: Occupational Therapy

## 2015-07-14 ENCOUNTER — Ambulatory Visit: Payer: BLUE CROSS/BLUE SHIELD | Attending: Nurse Practitioner | Admitting: Occupational Therapy

## 2015-07-14 DIAGNOSIS — M792 Neuralgia and neuritis, unspecified: Secondary | ICD-10-CM | POA: Diagnosis present

## 2015-07-14 DIAGNOSIS — G8194 Hemiplegia, unspecified affecting left nondominant side: Secondary | ICD-10-CM | POA: Insufficient documentation

## 2015-07-14 DIAGNOSIS — R201 Hypoesthesia of skin: Secondary | ICD-10-CM | POA: Insufficient documentation

## 2015-07-14 DIAGNOSIS — I6980 Unspecified sequelae of other cerebrovascular disease: Secondary | ICD-10-CM | POA: Diagnosis not present

## 2015-07-14 DIAGNOSIS — I639 Cerebral infarction, unspecified: Secondary | ICD-10-CM | POA: Diagnosis not present

## 2015-07-14 DIAGNOSIS — R279 Unspecified lack of coordination: Secondary | ICD-10-CM | POA: Insufficient documentation

## 2015-07-14 DIAGNOSIS — IMO0002 Reserved for concepts with insufficient information to code with codable children: Secondary | ICD-10-CM

## 2015-07-14 DIAGNOSIS — M6281 Muscle weakness (generalized): Secondary | ICD-10-CM | POA: Diagnosis present

## 2015-07-14 NOTE — Therapy (Signed)
[facility] [address] [place], [place], [address] Phone: [phone]   Fax:  [phone]  Occupational Therapy Treatment  Patient Details  Name: Tina Sharp MRN: [id number] Date of Birth: 04-Jan-1966 Referring Provider: Dr. Leonie Man  Encounter Date: 07/14/2015      OT End of Session - 07/14/15 1235    Visit Number 6   Number of Visits 16   Date for OT Re-Evaluation 08/11/15   Authorization Type [employer] - 30 visit limiit for OT   OT Start Time 0931   OT Stop Time 1013   OT Time Calculation (min) 42 min   Activity Tolerance Patient tolerated treatment well      Past Medical History  Diagnosis Date  . GERD (gastroesophageal reflux disease)   . Cancer [facility]) June 2015    Invasive lobular carcinoma, 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 ER/ PR+; Her 2 neu not overexpressed, microscopic positive margin (skin).  . Hypertension   . Depression   . Anxiety   . Stroke ([place]) 06/11/2015    cerebrum, cryptogenic right brain infarcts s/p IV TPA    Past Surgical History  Procedure Laterality Date  . Laparoscopic hysterectomy  2010    [facility]  . Breast surgery Left 04/08/14    mastectomy  . Reduction mammoplasty Right 04/08/14    Dr Tula Nakayama  . Colonoscopy    . Ep implantable device N/A 06/14/2015    Procedure: Loop Recorder Insertion;  Surgeon: Evans Lance, MD;  Location: [facility];  Service: Cardiovascular;  Laterality: N/A;  . Tee without cardioversion N/A 06/14/2015    Procedure: TRANSESOPHAGEAL ECHOCARDIOGRAM (TEE);  Surgeon: Josue Hector, MD;  Location: [facility] ENDOSCOPY;  Service: Cardiovascular;  Laterality: N/A;  . Abdominal hysterectomy  2000    left both ovaries in  . Tonsillectomy and adenoidectomy      pt was 49 years old    There were no vitals filed for this visit.  Visit Diagnosis:  Lack of coordination due to stroke  Impaired sensation  Hemiplegia affecting left nondominant side (HCC)  Muscle  weakness      Subjective Assessment - 07/14/15 0933    Subjective  My hand is sore from the theraputty I think   Pertinent History see epic. 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 Yes   Pain Score 3    Pain Location Hand   Pain Orientation Left   Pain Descriptors / Indicators Sore   Pain Type Acute pain   Pain Onset In the past 7 days   Aggravating Factors  I have been using the theraputty at home and I think that is why it is sore.    Pain Relieving Factors unsure                      OT Treatments/Exercises (OP) - 07/14/15 0001    Fine Motor Coordination   In Hand Manipulation Training Therapeutic functional activities with focus on fine motor coordination, position of hand relative to the task, monitoring abnormal posturing in hand, in hand manipulation. Pt's control improves when she slows the activity down. Will focus on control and accuracy for now vs speed.    Modalities   Modalities Fluidotherapy   LUE Fluidotherapy   Number Minutes Fluidotherapy 12 Minutes   LUE Fluidotherapy Location  Hand   Comments Pt wtih 3/10 soreness in L hand from increased use and possibly theraputty program.  Pt with no adverse reactions and pain decrease from 3/10 to 1/10. Followed by active functional use of L hand. Pt also instructed to decrease theraputty program to 1x/day for now and slowly ramp up to 2x/day. Pt verbalized understanding.                   OT Short Term Goals - 07/14/15 1233    OT SHORT TERM GOAL #1   Title Pt will be mod I with HEP - 07/21/2015 (adjusted as pt missed one week of theraputty due to being ill)   Status On-going   OT SHORT TERM GOAL #2   Title Pt will be able to button and zipper clothing with increased time.   Status Achieved   OT SHORT TERM GOAL #3   Title Pt will demonstrate improved grip strength by at least 5 pounds  to assist with functional tasks (baseline=15 pounds)   Status Achieved  30 pounds   OT SHORT TERM GOAL #4   Title Pt will demonstrate improved fine motor coordination as evidenced by decreasing time on 9 hole peg test by at least 8 seconds (baseline= 54.48)   Status Achieved  45.2   OT SHORT TERM GOAL #5   Title Pt will demonstrate ability to pick up small objects with mod compensations, min dropping and increased time.    Status Achieved   OT SHORT TERM GOAL #6   Title Pt will require no more than min vc's to use vision to assist in compensation of use of L hand during functional tasks.    Status Achieved           OT Long Term Goals - 07/14/15 1234    OT LONG TERM GOAL #1   Title Pt will be mod I with upgraded HEP - 12.29/2016   Status On-going   OT LONG TERM GOAL #2   Title Pt will demonstrate increased grip strength by at least 25 pounds to assist with functional tasks (baseline= 15 pounds)   Status On-going   OT LONG TERM GOAL #3   Title Pt will demonstrate improved coordinaton as evidenced by decreasing time on 9 hole peg test by at least 15 seconds (baseline= 54.48)   Status On-going   OT LONG TERM GOAL #4   Title Pt will be I with tying shoes using both hands   Status On-going   OT LONG TERM GOAL #5   Title Pt will be able to use LUE as non dominant assist for basic self care at least 75% of the time   Status On-going   OT LONG TERM GOAL #6   Title Pt will be able to orient hand approrpriately and manipulate small objects in left hand during functional tasks with min compensations.    Status On-going   OT LONG TERM GOAL #7   Title Pt will be able to complete bilateral tasks with min compensations   Status On-going   OT LONG TERM GOAL #8   Title Pt will be able to type 4 sentence paragrah with at least 75% accuracy using both hands   Status On-going               Plan - 07/14/15 1234    Clinical Impression Statement Pt making good progress toward goals.  Pt is very motivated to improve functional use of L hand.    Pt  will benefit from skilled therapeutic intervention in order to improve on the following deficits (Retired) Decreased activity tolerance;Decreased coordination;Decreased knowledge of use of DME;Decreased strength;Impaired UE functional use;Impaired sensation;Pain   Rehab Potential Good   OT Frequency 2x / week   OT Duration 8 weeks   OT Treatment/Interventions Self-care/ADL training;Moist Heat;Electrical Stimulation;Fluidtherapy;DME and/or AE instruction;Neuromuscular education;Therapeutic exercise;Manual Therapy;Splinting;Therapeutic activities;Patient/family education   Plan monitor pain, check HEP, intiate FM HEP, typing task   Consulted and Agree with Plan of Care Patient        Problem List Patient Active Problem List   Diagnosis Date Noted  . Hyperhomocysteinemia ([place]) 07/12/2015  . Stroke with cerebral ischemia ([place]) 07/12/2015  . HLD (hyperlipidemia) 07/12/2015  . CVA (cerebral infarction) 06/21/2015  . TIA (transient ischemic attack) 06/21/2015  . Macrocytic anemia 06/20/2015  . Headache 06/14/2015  . Cigarette smoker 06/14/2015  . Stroke (cerebrum) ([place]) cryptogenic R brain infarcts s/p IV tPA  06/11/2015  . Malignant neoplasm of breast (female), unspecified site 04/23/2014  . Breast cancer ([place]) 02/20/2014    Quay Burow, OTR/L 07/14/2015, 12:37 PM  [facility] [address] [place], [place], [address] Phone: [phone]   Fax:  [phone]  Name: Tina Sharp MRN: [id number] Date of Birth: May 30, 1966

## 2015-07-14 NOTE — Telephone Encounter (Signed)
Rn call Marjory Lies who will fax [facility] form for patient. He works for the [employer] at [phone]. Marjory Lies stated to fax the form to him and the company. Form was receive by Rn.

## 2015-07-15 NOTE — Telephone Encounter (Signed)
LFt vm for patient that FMLA form stated fail will fax again. Rn fax form again to Seth Bake at [facility] to [phone]. Fmla forms were fax twice and receive,

## 2015-07-15 NOTE — Telephone Encounter (Signed)
Rn call and ask for Tina Sharp at [facility]. Darcy at the office stated she was out to lunch.Rn explain that [employer] was close for the day. Salomon Fick stated they did receive the fax

## 2015-07-15 NOTE — Telephone Encounter (Signed)
Patient called to advise work has not received FMLA paperwork, please check on this for her.

## 2015-07-18 ENCOUNTER — Ambulatory Visit: Payer: BLUE CROSS/BLUE SHIELD | Admitting: Occupational Therapy

## 2015-07-18 ENCOUNTER — Encounter: Payer: Self-pay | Admitting: Occupational Therapy

## 2015-07-18 DIAGNOSIS — G8194 Hemiplegia, unspecified affecting left nondominant side: Secondary | ICD-10-CM

## 2015-07-18 DIAGNOSIS — M6281 Muscle weakness (generalized): Secondary | ICD-10-CM

## 2015-07-18 DIAGNOSIS — R201 Hypoesthesia of skin: Secondary | ICD-10-CM

## 2015-07-18 DIAGNOSIS — I6980 Unspecified sequelae of other cerebrovascular disease: Secondary | ICD-10-CM | POA: Diagnosis not present

## 2015-07-18 DIAGNOSIS — IMO0002 Reserved for concepts with insufficient information to code with codable children: Secondary | ICD-10-CM

## 2015-07-18 NOTE — Therapy (Signed)
[facility] [address] [place], [place], [address] Phone: [phone]   Fax:  [phone]  Occupational Therapy Treatment  Patient Details  Name: Tina Sharp MRN: [id number] Date of Birth: [birth year] Referring Provider: Dr. Leonie Man  Encounter Date: 07/18/2015      OT End of Session - 07/18/15 1248    Visit Number 7   Number of Visits 16   Date for OT Re-Evaluation 08/11/15   Authorization Type BCBS - 30 visit limiit for OT   OT Start Time 1100   OT Stop Time 1145   OT Time Calculation (min) 45 min   Activity Tolerance Patient tolerated treatment well      Past Medical History  Diagnosis Date  . GERD (gastroesophageal reflux disease)   . Cancer [facility]) June 2015    Invasive lobular carcinoma, 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 ER/ PR+; Her 2 neu not overexpressed, microscopic positive margin (skin).  . Hypertension   . Depression   . Anxiety   . 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 Laparoscopic hysterectomy  2010    [facility]  . Breast surgery Left 04/08/14    mastectomy  . Reduction mammoplasty Right 04/08/14    Dr Tula Nakayama  . Colonoscopy    . Ep implantable device N/A 06/14/2015    Procedure: Loop Recorder Insertion;  Surgeon: Evans Lance, MD;  Location: [place] CV LAB;  Service: Cardiovascular;  Laterality: N/A;  . Tee without cardioversion N/A 06/14/2015    Procedure: TRANSESOPHAGEAL ECHOCARDIOGRAM (TEE);  Surgeon: Josue Hector, MD;  Location: [facility] ENDOSCOPY;  Service: Cardiovascular;  Laterality: N/A;  . Abdominal hysterectomy  2000    left both ovaries in  . Tonsillectomy and adenoidectomy      pt was 49 years old    There were no vitals filed for this visit.  Visit Diagnosis:  Lack of coordination due to stroke  Impaired sensation  Hemiplegia affecting left nondominant side (HCC)  Muscle  weakness      Subjective Assessment - 07/18/15 1105    Subjective  The pain is much better - the heat and the ice are doing wonders.    Pertinent History see epic. 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 No/denies                      OT Treatments/Exercises (OP) - 07/18/15 0001    Neurological Re-education Exercises   Other Exercises 1 Neuro re ed to address hand control/coordination. functional use with emphasis on use and control of isolated finger movement. Pt with dystonic posturing especially in L ring and pinky finger and with ataxia and poor control during functional tasks (i.e. typing). Pt given activities at home as part of HEP to work on. Also addressed functional strength via activities.                   OT Short Term Goals - 07/18/15 1246    OT SHORT TERM GOAL #1   Title Pt will be mod I with HEP - 07/21/2015 (adjusted as pt missed one week of theraputty due to being ill)   Status Achieved   OT SHORT TERM GOAL #2  Title Pt will be able to button and zipper clothing with increased time.   Status Achieved   OT SHORT TERM GOAL #3   Title Pt will demonstrate improved grip strength by at least 5 pounds to assist with functional tasks (baseline=15 pounds)   Status Achieved  30 pounds   OT SHORT TERM GOAL #4   Title Pt will demonstrate improved fine motor coordination as evidenced by decreasing time on 9 hole peg test by at least 8 seconds (baseline= 54.48)   Status Achieved  45.2   OT SHORT TERM GOAL #5   Title Pt will demonstrate ability to pick up small objects with mod compensations, min dropping and increased time.    Status Achieved   OT SHORT TERM GOAL #6   Title Pt will require no more than min vc's to use vision to assist in compensation of use of L hand during functional tasks.    Status Achieved           OT Long Term  Goals - 07/18/15 1246    OT LONG TERM GOAL #1   Title Pt will be mod I with upgraded HEP - 12.29/2016   Status On-going   OT LONG TERM GOAL #2   Title Pt will demonstrate increased grip strength by at least 25 pounds to assist with functional tasks (baseline= 15 pounds)   Status On-going   OT LONG TERM GOAL #3   Title Pt will demonstrate improved coordinaton as evidenced by decreasing time on 9 hole peg test by at least 15 seconds (baseline= 54.48)   Status On-going   OT LONG TERM GOAL #4   Title Pt will be I with tying shoes using both hands   Status On-going   OT LONG TERM GOAL #5   Title Pt will be able to use LUE as non dominant assist for basic self care at least 75% of the time   Status On-going   OT LONG TERM GOAL #6   Title Pt will be able to orient hand approrpriately and manipulate small objects in left hand during functional tasks with min compensations.    Status On-going   OT LONG TERM GOAL #7   Title Pt will be able to complete bilateral tasks with min compensations   Status On-going   OT LONG TERM GOAL #8   Title Pt will be able to type 4 sentence paragrah with at least 75% accuracy using both hands   Status On-going               Plan - 07/18/15 1246    Clinical Impression Statement Pt is making good progress toward goals and has met all STG's. Pt tolerating and very paricipatory in HEP.    Pt will benefit from skilled therapeutic intervention in order to improve on the following deficits (Retired) Decreased activity tolerance;Decreased coordination;Decreased knowledge of use of DME;Decreased strength;Impaired UE functional use;Impaired sensation;Pain   Rehab Potential Good   OT Frequency 2x / week   OT Duration 8 weeks   OT Treatment/Interventions Self-care/ADL training;Moist Heat;Electrical Stimulation;Fluidtherapy;DME and/or AE instruction;Neuromuscular education;Therapeutic exercise;Manual Therapy;Splinting;Therapeutic activities;Patient/family education    Plan check HEP, monitor pain, FM, tying, isolated finger movement, strength, functional use   Consulted and Agree with Plan of Care Patient        Problem List Patient Active Problem List   Diagnosis Date Noted  . 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 Stroke with cerebral ischemia ([place]) 07/12/2015  . HLD (hyperlipidemia) 07/12/2015  . CVA (  cerebral infarction) 06/21/2015  . TIA (transient ischemic attack) 06/21/2015  . Macrocytic anemia 06/20/2015  . Headache 06/14/2015  . Cigarette smoker 06/14/2015  . Stroke (cerebrum) ([place]) cryptogenic R brain infarcts s/p IV tPA  06/11/2015  . Malignant neoplasm of breast (female), unspecified site 04/23/2014  . Breast cancer ([place]) 02/20/2014    Quay Burow, OTR/L 07/18/2015, 12:49 PM  [facility] [address] [place], [place], [address] Phone: [phone]   Fax:  [phone]  Name: LAYNEE LOCKAMY MRN: [id number] Date of Birth: 1965-09-23

## 2015-07-18 NOTE — Progress Notes (Signed)
Carelink Summary Report / Loop Recorder 

## 2015-07-21 ENCOUNTER — Encounter: Payer: Self-pay | Admitting: Occupational Therapy

## 2015-07-21 ENCOUNTER — Ambulatory Visit: Payer: BLUE CROSS/BLUE SHIELD | Admitting: Occupational Therapy

## 2015-07-21 DIAGNOSIS — R201 Hypoesthesia of skin: Secondary | ICD-10-CM

## 2015-07-21 DIAGNOSIS — M6281 Muscle weakness (generalized): Secondary | ICD-10-CM

## 2015-07-21 DIAGNOSIS — I6980 Unspecified sequelae of other cerebrovascular disease: Secondary | ICD-10-CM | POA: Diagnosis not present

## 2015-07-21 DIAGNOSIS — G8194 Hemiplegia, unspecified affecting left nondominant side: Secondary | ICD-10-CM

## 2015-07-21 DIAGNOSIS — IMO0002 Reserved for concepts with insufficient information to code with codable children: Secondary | ICD-10-CM

## 2015-07-21 NOTE — Therapy (Signed)
[facility] [address] [place], [place], [address] Phone: [phone]   Fax:  [phone]  Occupational Therapy Treatment  Patient Details  Name: Tina Sharp MRN: [id number] Date of Birth: 12/10/1965 Referring Provider: Dr. Leonie Man  Encounter Date: 07/21/2015      OT End of Session - 07/21/15 1147    Visit Number 8   Number of Visits 16   Date for OT Re-Evaluation 08/11/15   Authorization Type BCBS - 30 visit limiit for OT   OT Start Time 1105   OT Stop Time 1145   OT Time Calculation (min) 40 min   Activity Tolerance Patient tolerated treatment well      Past Medical History  Diagnosis Date  . GERD (gastroesophageal reflux disease)   . Cancer 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 ER/ PR+; Her 2 neu not overexpressed, microscopic positive margin (skin).  . Hypertension   . Depression   . Anxiety   . 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 Laparoscopic hysterectomy  2010    [facility]  . Breast surgery Left 04/08/14    mastectomy  . Reduction mammoplasty Right 04/08/14    Dr Tula Nakayama  . Colonoscopy    . Ep implantable device N/A 06/14/2015    Procedure: Loop Recorder Insertion;  Surgeon: Evans Lance, MD;  Location: [facility] CV LAB;  Service: Cardiovascular;  Laterality: N/A;  . Tee without cardioversion N/A 06/14/2015    Procedure: TRANSESOPHAGEAL ECHOCARDIOGRAM (TEE);  Surgeon: Josue Hector, MD;  Location: [facility] ENDOSCOPY;  Service: Cardiovascular;  Laterality: N/A;  . Abdominal hysterectomy  2000    left both ovaries in  . Tonsillectomy and adenoidectomy      pt was 49 years old    There were no vitals filed for this visit.  Visit Diagnosis:  Lack of coordination due to stroke  Impaired sensation  Hemiplegia affecting left nondominant side (HCC)  Muscle  weakness      Subjective Assessment - 07/21/15 1113    Subjective  I am feeling a little under the weather today.   Pertinent History see epic. 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 No/denies                      OT Treatments/Exercises (OP) - 07/21/15 0001    ADLs   LB Dressing Pracitced tying shoes with shoe on table. Pt initially use vision to compensate for decreased sensation and decreased coordination. Pt benefits from repetition.  Pt with moderate difficulty but able to do. Pt also practiced with eyes closed to force automatic hand movements. Pt to practice at home wiith both eyes open and closed.    Exercises   Exercises Hand   Hand Exercises   Hand Gripper with Small Beads Pt able to do 25# of resistance to pick up one inch blocks and make stacks of 4 to address sustained grip strength, coordination and hand orientation. Pt required min vc's for hand orientation and 3 rest breaks during the activity. Only dropped 2 blocks.    Rubberbands Various therapetic functional tasks with focus on in hand manipulation for very small items. Pt with  moderate difficulty.                   OT Short Term Goals - 07/21/15 1145    OT SHORT TERM GOAL #1   Title Pt will be mod I with HEP - 07/21/2015 (adjusted as pt missed one week of theraputty due to being ill)   Status Achieved   OT SHORT TERM GOAL #2   Title Pt will be able to button and zipper clothing with increased time.   Status Achieved   OT SHORT TERM GOAL #3   Title Pt will demonstrate improved grip strength by at least 5 pounds to assist with functional tasks (baseline=15 pounds)   Status Achieved  30 pounds   OT SHORT TERM GOAL #4   Title Pt will demonstrate improved fine motor coordination as evidenced by decreasing time on 9 hole peg test by at least 8 seconds (baseline= 54.48)   Status  Achieved  45.2   OT SHORT TERM GOAL #5   Title Pt will demonstrate ability to pick up small objects with mod compensations, min dropping and increased time.    Status Achieved   OT SHORT TERM GOAL #6   Title Pt will require no more than min vc's to use vision to assist in compensation of use of L hand during functional tasks.    Status Achieved           OT Long Term Goals - 07/21/15 1145    OT LONG TERM GOAL #1   Title Pt will be mod I with upgraded HEP - 12.29/2016   Status On-going   OT LONG TERM GOAL #2   Title Pt will demonstrate increased grip strength by at least 25 pounds to assist with functional tasks (baseline= 15 pounds)   Status On-going   OT LONG TERM GOAL #3   Title Pt will demonstrate improved coordinaton as evidenced by decreasing time on 9 hole peg test by at least 15 seconds (baseline= 54.48)   Status On-going   OT LONG TERM GOAL #4   Title Pt will be I with tying shoes using both hands   Status On-going   OT LONG TERM GOAL #5   Title Pt will be able to use LUE as non dominant assist for basic self care at least 75% of the time   Status On-going   OT LONG TERM GOAL #6   Title Pt will be able to orient hand approrpriately and manipulate small objects in left hand during functional tasks with min compensations.    Status On-going   OT LONG TERM GOAL #7   Title Pt will be able to complete bilateral tasks with min compensations   Status On-going   OT LONG TERM GOAL #8   Title Pt will be able to type 4 sentence paragrah with at least 75% accuracy using both hands   Status On-going               Plan - 07/21/15 1146    Clinical Impression Statement Pt making great progress and demonstrating improved coordination and control of L hand. Pt reports greater ease in using hand at home.   Pt will benefit from skilled therapeutic intervention in order to improve on the following deficits (Retired) Decreased activity tolerance;Decreased coordination;Decreased  knowledge of use of DME;Decreased strength;Impaired UE functional use;Impaired sensation;Pain   Rehab Potential Good   OT Frequency 2x / week   OT Duration 8 weeks   OT Treatment/Interventions Self-care/ADL training;Moist  Heat;Electrical Stimulation;Fluidtherapy;DME and/or AE instruction;Neuromuscular education;Therapeutic exercise;Manual Therapy;Splinting;Therapeutic activities;Patient/family education   Plan FM, check shoe tying, strength, coordination, typing   Consulted and Agree with Plan of Care Patient        Problem List Patient Active Problem List   Diagnosis Date Noted  . 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 Stroke with cerebral ischemia ([place]) 07/12/2015  . HLD (hyperlipidemia) 07/12/2015  . CVA (cerebral infarction) 06/21/2015  . TIA (transient ischemic attack) 06/21/2015  . Macrocytic anemia 06/20/2015  . Headache 06/14/2015  . Cigarette smoker 06/14/2015  . Stroke (cerebrum) ([place]) cryptogenic R brain infarcts s/p IV tPA  06/11/2015  . Malignant neoplasm of breast (female), unspecified site 04/23/2014  . Breast cancer ([place]) 02/20/2014    Quay Burow, OTR/L 07/21/2015, 11:49 AM  [facility] [address] [place], [place], [address] Phone: [phone]   Fax:  [phone]  Name: LYNDZEE SEIGAL MRN: [id number] Date of Birth: [birth year]

## 2015-07-22 ENCOUNTER — Telehealth: Payer: Self-pay | Admitting: Neurology

## 2015-07-22 LAB — CUP PACEART REMOTE DEVICE CHECK: MDC IDC SESS DTM: 20161201143512

## 2015-07-22 NOTE — Telephone Encounter (Signed)
[employer] [phone], Fax# [phone] called to advise 1st day of disability was left off of STD form, claim is pended until they get that 1st day of disability.

## 2015-07-22 NOTE — Telephone Encounter (Signed)
Fwd to Dr. Clydene Fake RN-Katrina.

## 2015-07-22 NOTE — Progress Notes (Signed)
Carelink summary report received. Battery status OK. Normal device function. No new symptom episodes, tachy episodes, brady, or pause episodes. No new AF episodes. Monthly summary reports and ROV w/ GT PRN.  

## 2015-07-25 ENCOUNTER — Telehealth: Payer: Self-pay

## 2015-07-25 ENCOUNTER — Ambulatory Visit: Payer: BLUE CROSS/BLUE SHIELD | Admitting: Occupational Therapy

## 2015-07-25 NOTE — Telephone Encounter (Signed)
Form fax back to [facility] with correct date. Fax receive. LFt message for Butch Penny that form was fax and receive.

## 2015-07-25 NOTE — Telephone Encounter (Signed)
Rn call Butch Penny at [employer] and stated patient was admitted on 06-11-15 and transferred to [facility] for stoke work up per via epic. Butch Penny stated the employer will have to change the date to 06-11-15. Butch Penny stated she will call the employer for the date change.

## 2015-07-25 NOTE — Telephone Encounter (Signed)
LFt vm that [employer] form was fax back with date of 06-11-15 for disability form.

## 2015-07-25 NOTE — Telephone Encounter (Signed)
Tina Sharp, Tina Sharp [phone] called to advise form was received back, there is a discrepancy in the 1st day out of work, Dr. Leonie Man put 06/11/15, employer put 06/13/15. Tina Sharp will call to see if employer can change their date since 06/11/15 was a weekend day and will call back if needed.

## 2015-07-28 ENCOUNTER — Ambulatory Visit: Payer: BLUE CROSS/BLUE SHIELD | Admitting: Occupational Therapy

## 2015-08-01 ENCOUNTER — Ambulatory Visit: Payer: BLUE CROSS/BLUE SHIELD | Admitting: Occupational Therapy

## 2015-08-01 ENCOUNTER — Encounter: Payer: Self-pay | Admitting: Occupational Therapy

## 2015-08-01 DIAGNOSIS — G8194 Hemiplegia, unspecified affecting left nondominant side: Secondary | ICD-10-CM

## 2015-08-01 DIAGNOSIS — IMO0002 Reserved for concepts with insufficient information to code with codable children: Secondary | ICD-10-CM

## 2015-08-01 DIAGNOSIS — R201 Hypoesthesia of skin: Secondary | ICD-10-CM

## 2015-08-01 DIAGNOSIS — M6281 Muscle weakness (generalized): Secondary | ICD-10-CM

## 2015-08-01 DIAGNOSIS — M792 Neuralgia and neuritis, unspecified: Secondary | ICD-10-CM

## 2015-08-01 DIAGNOSIS — I6980 Unspecified sequelae of other cerebrovascular disease: Secondary | ICD-10-CM | POA: Diagnosis not present

## 2015-08-01 NOTE — Therapy (Signed)
[facility] [address] [place], [place], [address] Phone: [phone]   Fax:  [phone]  Occupational Therapy Treatment  Patient Details  Name: Tina Sharp MRN: [id number] Date of Birth: Oct 04, 1965 Referring Provider: Dr. Leonie Man  Encounter Date: 08/01/2015      OT End of Session - 08/01/15 1307    Visit Number 9   Number of Visits 16   Date for OT Re-Evaluation 08/25/15  date adjuted as pt missed 2 weeks of therapy due to illness   Authorization Type [employer]BS - 30 visit limiit for OT   OT Start Time 1101   OT Stop Time 1143   OT Time Calculation (min) 42 min      Past Medical History  Diagnosis Date  . GERD (gastroesophageal reflux disease)   . 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 ER/ PR+; Her 2 neu not overexpressed, microscopic positive margin (skin).  . Hypertension   . Depression   . Anxiety   . 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 Laparoscopic hysterectomy  2010    [facility]  . Breast surgery Left 04/08/14    mastectomy  . Reduction mammoplasty Right 04/08/14    Dr Tula Nakayama  . Colonoscopy    . Ep implantable device N/A 06/14/2015    Procedure: Loop Recorder Insertion;  Surgeon: Evans Lance, MD;  Location: [facility] CV LAB;  Service: Cardiovascular;  Laterality: N/A;  . Tee without cardioversion N/A 06/14/2015    Procedure: TRANSESOPHAGEAL ECHOCARDIOGRAM (TEE);  Surgeon: Josue Hector, MD;  Location: [facility] ENDOSCOPY;  Service: Cardiovascular;  Laterality: N/A;  . Abdominal hysterectomy  2000    left both ovaries in  . Tonsillectomy and adenoidectomy      pt was 49 years old    There were no vitals filed for this visit.  Visit Diagnosis:  Lack of coordination due to stroke  Impaired sensation  Hemiplegia affecting left nondominant side  (HCC)  Muscle weakness  Neuropathic pain      Subjective Assessment - 08/01/15 1105    Subjective  I am still not feeling well but I am here.   Pertinent History see epic. 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 Yes   Pain Score 3    Pain Location Hand   Pain Orientation Left   Pain Descriptors / Indicators Pins and needles;Burning   Pain Type Acute pain   Pain Onset In the past 7 days   Pain Frequency Intermittent   Aggravating Factors  over use the hand    Pain Relieving Factors rest,heat                      OT Treatments/Exercises (OP) - 08/01/15 0001    ADLs   Writing Practiced typing - initially had pt type using technique from before the stroke (i.e. not looking at hands, not looking at screen and just completing by feel so that pt would realize how much sensation plays role in this activity. Pt with mulitple mistakes and became very frustrated.  Addresed strategy for patient to look at whole sentence she needs to type, then look at L hand while typing,  and verify word on screen after each word. Pt with signficant improvement and with practice had far fewer errors.  This technique takes signifcantly more time however discussed with pt that with practice this will become routine and will likely need to be how she types to compensate for lack of sensation. Pt to practice in 3 trials/day for 10-15 minutes at time to avoid frustration. Pt able to verbalize understanding and in agreeemnt with plan.    LUE Fluidotherapy   Number Minutes Fluidotherapy 12 Minutes   LUE Fluidotherapy Location Hand   Comments Pt with parathesias/discomfort rating 3/10 at beginning of session. Good response to fluido and pt rated discomfort 1/10 after treatment.                   OT Short Term Goals - 08/01/15 1303    OT SHORT TERM GOAL #1   Title Pt will be mod I  with HEP - 07/21/2015 (adjusted as pt missed one week of theraputty due to being ill)   Status Achieved   OT SHORT TERM GOAL #2   Title Pt will be able to button and zipper clothing with increased time.   Status Achieved   OT SHORT TERM GOAL #3   Title Pt will demonstrate improved grip strength by at least 5 pounds to assist with functional tasks (baseline=15 pounds)   Status Achieved  30 pounds   OT SHORT TERM GOAL #4   Title Pt will demonstrate improved fine motor coordination as evidenced by decreasing time on 9 hole peg test by at least 8 seconds (baseline= 54.48)   Status Achieved  45.2   OT SHORT TERM GOAL #5   Title Pt will demonstrate ability to pick up small objects with mod compensations, min dropping and increased time.    Status Achieved   OT SHORT TERM GOAL #6   Title Pt will require no more than min vc's to use vision to assist in compensation of use of L hand during functional tasks.    Status Achieved           OT Long Term Goals - 08/01/15 1303    OT LONG TERM GOAL #1   Title Pt will be mod I with upgraded HEP - 08/25/2015 (date adjusted as pt missed 2 weeks of therapy due to illness)   Status On-going   OT LONG TERM GOAL #2   Title Pt will demonstrate increased grip strength by at least 25 pounds to assist with functional tasks (baseline= 15 pounds)   Status On-going   OT LONG TERM GOAL #3   Title Pt will demonstrate improved coordinaton as evidenced by decreasing time on 9 hole peg test by at least 15 seconds (baseline= 54.48)   Status On-going   OT LONG TERM GOAL #4   Title Pt will be I with tying shoes using both hands   Status On-going   OT LONG TERM GOAL #5   Title Pt will be able to use LUE as non dominant assist for basic self care at least 75% of the time   Status On-going   OT LONG TERM GOAL #6   Title Pt will be able to orient hand approrpriately and manipulate small objects in left hand during functional tasks with min compensations.    Status  On-going   OT LONG TERM GOAL #7   Title Pt will be able to complete bilateral tasks with min compensations   Status On-going   OT LONG TERM GOAL #  8   Title Pt will be able to type 4 sentence paragrah with at least 75% accuracy using both hands   Status On-going               Plan - 08/01/15 1306    Clinical Impression Statement Pt making slow but steady progress toward goals. Pt easily frustrated by typing activity but has agreed to work on at home in short bursts.    Pt will benefit from skilled therapeutic intervention in order to improve on the following deficits (Retired) Decreased activity tolerance;Decreased coordination;Decreased knowledge of use of DME;Decreased strength;Impaired UE functional use;Impaired sensation;Pain   Rehab Potential Good   OT Frequency 2x / week   OT Duration 8 weeks   OT Treatment/Interventions Self-care/ADL training;Moist Heat;Electrical Stimulation;Fluidtherapy;DME and/or AE instruction;Neuromuscular education;Therapeutic exercise;Manual Therapy;Splinting;Therapeutic activities;Patient/family education   Plan check typing, shoe tying, strength, coordination,    Consulted and Agree with Plan of Care Patient        Problem List Patient Active Problem List   Diagnosis Date Noted  . 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 Stroke with cerebral ischemia ([place]) 07/12/2015  . HLD (hyperlipidemia) 07/12/2015  . CVA (cerebral infarction) 06/21/2015  . TIA (transient ischemic attack) 06/21/2015  . Macrocytic anemia 06/20/2015  . Headache 06/14/2015  . Cigarette smoker 06/14/2015  . Stroke (cerebrum) ([place]) cryptogenic R brain infarcts s/p IV tPA  06/11/2015  . Malignant neoplasm of breast (female), unspecified site 04/23/2014  . Breast cancer ([place]) 02/20/2014    Quay Burow, OTR/L 08/01/2015, 1:09 PM  [facility] [address] [place], [place], [address] Phone:  [phone]   Fax:  [phone]  Name: Tina Sharp MRN: [id number] Date of Birth: January 26, 1966

## 2015-08-02 ENCOUNTER — Telehealth: Payer: Self-pay | Admitting: General Surgery

## 2015-08-02 ENCOUNTER — Ambulatory Visit: Payer: BLUE CROSS/BLUE SHIELD | Admitting: General Surgery

## 2015-08-02 ENCOUNTER — Encounter: Payer: Self-pay | Admitting: General Surgery

## 2015-08-02 NOTE — Telephone Encounter (Signed)
Discussed recent mammogram results. Likely, secondary to fat necrosis. We'll review options for management and follow-up on January 3: 1) stereo biopsy to confirm impression versus 2) six-month follow-up mammogram.

## 2015-08-04 ENCOUNTER — Ambulatory Visit: Payer: BLUE CROSS/BLUE SHIELD | Admitting: Occupational Therapy

## 2015-08-04 ENCOUNTER — Encounter: Payer: Self-pay | Admitting: Occupational Therapy

## 2015-08-04 DIAGNOSIS — R201 Hypoesthesia of skin: Secondary | ICD-10-CM

## 2015-08-04 DIAGNOSIS — I6980 Unspecified sequelae of other cerebrovascular disease: Secondary | ICD-10-CM | POA: Diagnosis not present

## 2015-08-04 DIAGNOSIS — M6281 Muscle weakness (generalized): Secondary | ICD-10-CM

## 2015-08-04 DIAGNOSIS — G8194 Hemiplegia, unspecified affecting left nondominant side: Secondary | ICD-10-CM

## 2015-08-04 DIAGNOSIS — IMO0002 Reserved for concepts with insufficient information to code with codable children: Secondary | ICD-10-CM

## 2015-08-04 NOTE — Therapy (Signed)
[facility] [address] [place], [place], [address] Phone: [phone]   Fax:  [phone]  Occupational Therapy Treatment  Patient Details  Name: Tina Sharp MRN: [id number] Date of Birth: 1966/05/19 Referring Provider: Dr. Leonie Man  Encounter Date: 08/04/2015      OT End of Session - 08/04/15 1147    Visit Number 10   Number of Visits 16   Date for OT Re-Evaluation 08/25/15   Authorization Type BCBS - 30 visit limiit for OT   OT Start Time 1100   OT Stop Time 1148   OT Time Calculation (min) 48 min   Activity Tolerance Patient tolerated treatment well      Past Medical History  Diagnosis Date  . GERD (gastroesophageal reflux disease)   . Cancer [facility]) June 2015    Invasive lobular carcinoma, 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 ER/ PR+; Her 2 neu not overexpressed, microscopic positive margin (skin).  . Hypertension   . Depression   . Anxiety   . 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 Laparoscopic hysterectomy  2010    [facility]  . Breast surgery Left 04/08/14    mastectomy  . Reduction mammoplasty Right 04/08/14    Dr Tula Nakayama  . Colonoscopy    . Ep implantable device N/A 06/14/2015    Procedure: Loop Recorder Insertion;  Surgeon: Evans Lance, MD;  Location: [facility];  Service: Cardiovascular;  Laterality: N/A;  . Tee without cardioversion N/A 06/14/2015    Procedure: TRANSESOPHAGEAL ECHOCARDIOGRAM (TEE);  Surgeon: Josue Hector, MD;  Location: [facility] ENDOSCOPY;  Service: Cardiovascular;  Laterality: N/A;  . Abdominal hysterectomy  2000    left both ovaries in  . Tonsillectomy and adenoidectomy      pt was 49 years old    There were no vitals filed for this visit.  Visit Diagnosis:  Lack of coordination due to stroke  Impaired sensation  Hemiplegia affecting left nondominant side  (HCC)  Muscle weakness      Subjective Assessment - 08/04/15 1106    Pertinent History see epic. 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 Yes   Pain Score 4    Pain Location Hand   Pain Orientation Left   Pain Descriptors / Indicators Throbbing   Pain Type Acute pain   Pain Onset Today   Pain Frequency Constant   Aggravating Factors  I typed alot yesterday and then I think I slept on it wrong.   Pain Relieving Factors unknown as pain is new.                       OT Treatments/Exercises (OP) - 08/04/15 0001    Exercises   Exercises Hand   Hand Exercises   Other Hand Exercises Pt instructed in tendon glides to address tightness and stiffness in L hand.  Pt able to complete 15 reps of each (hook, composite and roof). Pt able to return demonstrate after instuction and demonstration as well as practice. Pt to complete as part of HEP    Other Hand Exercises Grooved peg board and functional task to address in hand manipulation. Pt improving in skill however requires incresaed time. Avoided resistive activties  today due to discomfort in  hand.  Pt reports she does theraputty program right after typing recommended that she space out activities to avoid overuse.    LUE Fluidotherapy   Number Minutes Fluidotherapy 12 Minutes   LUE Fluidotherapy Location Hand   Comments Pt reporting overuse yesterday and then feels she slept on hand wrong (pt with poor sensation). Pt reporting 4/10 pain level at beginning of session. After fluido and manual therapy pt reports   2 /10 pain.    Manual Therapy   Manual Therapy Joint mobilization;Soft tissue mobilization   Manual therapy comments Pt with hand pain today she feels do to overuse yesterday and sleeping on hand wrong (pt has been practicing typing)  Joint and soft tissue mob to decrease stiffness and tightness in hand.                  OT Education - 08/04/15 1128    Education provided Yes   Education Details tendon glides to decrease tightness and stiffness   Person(s) Educated Patient   Methods Explanation;Demonstration;Tactile cues;Verbal cues;Handout   Comprehension Verbalized understanding;Returned demonstration          OT Short Term Goals - 08/04/15 1129    OT SHORT TERM GOAL #1   Title Pt will be mod I with HEP - 07/21/2015 (adjusted as pt missed one week of theraputty due to being ill)   Status Achieved   OT SHORT TERM GOAL #2   Title Pt will be able to button and zipper clothing with increased time.   Status Achieved   OT SHORT TERM GOAL #3   Title Pt will demonstrate improved grip strength by at least 5 pounds to assist with functional tasks (baseline=15 pounds)   Status Achieved  30 pounds   OT SHORT TERM GOAL #4   Title Pt will demonstrate improved fine motor coordination as evidenced by decreasing time on 9 hole peg test by at least 8 seconds (baseline= 54.48)   Status Achieved  45.2   OT SHORT TERM GOAL #5   Title Pt will demonstrate ability to pick up small objects with mod compensations, min dropping and increased time.    Status Achieved   OT SHORT TERM GOAL #6   Title Pt will require no more than min vc's to use vision to assist in compensation of use of L hand during functional tasks.    Status Achieved           OT Long Term Goals - 08/04/15 1129    OT LONG TERM GOAL #1   Title Pt will be mod I with upgraded HEP - 08/25/2015 (date adjusted as pt missed 2 weeks of therapy due to illness)   Status On-going   OT LONG TERM GOAL #2   Title Pt will demonstrate increased grip strength by at least 25 pounds to assist with functional tasks (baseline= 15 pounds)   Status On-going   OT LONG TERM GOAL #3   Title Pt will demonstrate improved coordinaton as evidenced by decreasing time on 9 hole peg test by at least 15 seconds (baseline= 54.48)   Status On-going   OT LONG  TERM GOAL #4   Title Pt will be I with tying shoes using both hands   Status On-going   OT LONG TERM GOAL #5   Title Pt will be able to use LUE as non dominant assist for basic self care at least 75% of the time   Status On-going  OT LONG TERM GOAL #6   Title Pt will be able to orient hand approrpriately and manipulate small objects in left hand during functional tasks with min compensations.    Status On-going   OT LONG TERM GOAL #7   Title Pt will be able to complete bilateral tasks with min compensations   Status On-going   OT LONG TERM GOAL #8   Title Pt will be able to type 4 sentence paragrah with at least 75% accuracy using both hands   Status On-going               Plan - 08/04/15 1130    Clinical Impression Statement Pt making steady progress toward goals. Pt wtih pain in L hand today due to overuse and sleeping on it wrong.  Pt instructed in pain reduction techniques.    Pt will benefit from skilled therapeutic intervention in order to improve on the following deficits (Retired) Decreased activity tolerance;Decreased coordination;Decreased knowledge of use of DME;Decreased strength;Impaired UE functional use;Impaired sensation;Pain   Rehab Potential Good   OT Frequency 2x / week   OT Duration 8 weeks   OT Treatment/Interventions Self-care/ADL training;Moist Heat;Electrical Stimulation;Fluidtherapy;DME and/or AE instruction;Neuromuscular education;Therapeutic exercise;Manual Therapy;Splinting;Therapeutic activities;Patient/family education   Plan check pain, check typing, strength and coordination   Consulted and Agree with Plan of Care Patient        Problem List Patient Active Problem List   Diagnosis Date Noted  . 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 Stroke with cerebral ischemia ([place]) 07/12/2015  . HLD (hyperlipidemia) 07/12/2015  . CVA (cerebral infarction) 06/21/2015  . TIA (transient ischemic attack) 06/21/2015  . Macrocytic anemia 06/20/2015  .  Headache 06/14/2015  . Cigarette smoker 06/14/2015  . Stroke (cerebrum) ([place]) cryptogenic R brain infarcts s/p IV tPA  06/11/2015  . Malignant neoplasm of breast (female), unspecified site 04/23/2014  . Breast cancer ([place]) 02/20/2014    Tina Sharp, Tina Sharp 08/04/2015, 11:49 AM  [facility] [address] [place], [place], [address] Phone: [phone]   Fax:  [phone]  Name: Tina Sharp MRN: [id number] Date of Birth: 1965-10-12

## 2015-08-04 NOTE — Patient Instructions (Signed)
Pt instructed in  Tendon glides and issued hand out.

## 2015-08-09 ENCOUNTER — Ambulatory Visit: Payer: BLUE CROSS/BLUE SHIELD | Admitting: Occupational Therapy

## 2015-08-11 ENCOUNTER — Ambulatory Visit: Payer: BLUE CROSS/BLUE SHIELD | Admitting: Occupational Therapy

## 2015-08-11 ENCOUNTER — Encounter: Payer: Self-pay | Admitting: Occupational Therapy

## 2015-08-11 ENCOUNTER — Telehealth: Payer: Self-pay | Admitting: Occupational Therapy

## 2015-08-11 NOTE — Telephone Encounter (Signed)
Pt did not show for OT appointment today. Called and left message as pt did not answer.  Reminded pt she had an appointment and also informed pt she had no more OT appointments scheduled. Asked to call front office and schedule OT 2x/wk x4 weeks as soon as possible and also informed front office that pt should be calling to make these appointments.

## 2015-08-16 ENCOUNTER — Ambulatory Visit (INDEPENDENT_AMBULATORY_CARE_PROVIDER_SITE_OTHER): Payer: BLUE CROSS/BLUE SHIELD | Admitting: *Deleted

## 2015-08-16 ENCOUNTER — Encounter: Payer: Self-pay | Admitting: General Surgery

## 2015-08-16 ENCOUNTER — Ambulatory Visit (INDEPENDENT_AMBULATORY_CARE_PROVIDER_SITE_OTHER): Payer: BLUE CROSS/BLUE SHIELD | Admitting: General Surgery

## 2015-08-16 VITALS — BP 168/98 | HR 88 | Resp 14 | Ht 62.0 in | Wt 143.0 lb

## 2015-08-16 DIAGNOSIS — I639 Cerebral infarction, unspecified: Secondary | ICD-10-CM

## 2015-08-16 DIAGNOSIS — C50012 Malignant neoplasm of nipple and areola, left female breast: Secondary | ICD-10-CM | POA: Diagnosis not present

## 2015-08-16 NOTE — Progress Notes (Signed)
Carelink Summary Report / Loop Recorder 

## 2015-08-16 NOTE — Progress Notes (Signed)
Patient ID: Tina Sharp, female   DOB: 17-Apr-1966, 50 y.o.   MRN: [id number]  Chief Complaint  Patient presents with  . Follow-up    mammogram    HPI Tina Sharp is a 50 y.o. female.  Here today for 6 month follow up right mammogram on 08-02-15. She states she had a stroke Jun 11 2015, and is still recovering. Review of the record showed that she presented with left hand weakness. CT was negative. TPA was administered. Subsequent MRI completed in [place] showed multiple small infarcts on the right cortical area. 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 She states her balance is still "off". She is going to therapy for her left hand. She states she is productive cough, "green" for several weeks, but the volume is decreasing and her breathing is generally improving. No true with antibiotics have been initiated.  There apparently has been some difficulty getting her oral anticoagulation regulated.  . I personally reviewed the patient's medical history. HPI  Past Medical History  Diagnosis Date  . GERD (gastroesophageal reflux disease)   . 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 ER/ PR+; Her 2 neu not overexpressed, microscopic positive margin (skin).  . Hypertension   . Depression   . Anxiety   . 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 Laparoscopic hysterectomy  2010    [facility]  . Breast surgery Left 04/08/14    mastectomy  . Reduction mammoplasty Right 04/08/14    Dr Tula Nakayama  . Colonoscopy    . Ep implantable device N/A 06/14/2015    Procedure: Loop Recorder Insertion;  Surgeon: Evans Lance, MD;  Location: [facility];  Service: Cardiovascular;  Laterality: N/A;  . Tee  without cardioversion N/A 06/14/2015    Procedure: TRANSESOPHAGEAL ECHOCARDIOGRAM (TEE);  Surgeon: Josue Hector, MD;  Location: [facility] ENDOSCOPY;  Service: Cardiovascular;  Laterality: N/A;  . Abdominal hysterectomy  2000    left both ovaries in  . Tonsillectomy and adenoidectomy      pt was 50 years old    Family History  Problem Relation Age of Onset  . Breast cancer Mother   . Breast cancer Maternal Grandmother   . Cancer Maternal Grandfather 78    prostate  . Ovarian cancer Other   . Seizures Paternal Grandmother   . Seizures Paternal Grandfather     Social History Social History  Substance Use Topics  . Smoking status: Current Every Day Smoker -- 0.25 packs/day    Types: Cigarettes  . Smokeless tobacco: Never Used     Comment: E cig  . Alcohol Use: 0.0 oz/week    0 Standard drinks or equivalent per week     Comment: social    No Known Allergies  Current Outpatient Prescriptions  Medication Sig Dispense Refill  . anastrozole (ARIMIDEX) 1 MG tablet Take 1 tablet (1 mg total) by mouth every evening. 7 tablet 0  . esomeprazole (NEXIUM) 20 MG capsule Take 20 mg by mouth daily as needed (for heartburn).     . folic acid (FOLVITE) 1 MG tablet Take 1 tablet (1 mg total) by mouth daily. 30 tablet 0  . Hypromellose (ARTIFICIAL TEARS OP) Apply 2 drops  to eye 2 (two) times daily as needed (for dry eyes).    . prochlorperazine (COMPAZINE) 5 MG tablet Take 5 mg by mouth every 6 (six) hours as needed for nausea or vomiting.    . traMADol (ULTRAM) 50 MG tablet Take 0.5 tablets (25 mg total) by mouth every 6 (six) hours as needed. 30 tablet 2  . vitamin B-12 500 MCG tablet Take 1 tablet (500 mcg total) by mouth daily. 30 tablet 0  . vitamin B-6 (PYRIDOXINE) 25 MG tablet Take 1 tablet (25 mg total) by mouth daily. 90 tablet 3  . warfarin (COUMADIN) 5 MG tablet Take 1 tablet (5 mg total) by mouth daily at 6 PM. (Patient taking differently: Take 5 mg by mouth daily at 6 PM. 1/2 pill daily) 30  tablet 1   No current facility-administered medications for this visit.    Review of Systems Review of Systems  Constitutional: Negative.   Respiratory: Positive for cough.   Cardiovascular: Negative.   Neurological: Positive for headaches.    Blood pressure 168/98, pulse 88, resp. rate 14, height 5\' 2"  (1.575 m), weight 143 lb (64.864 kg).  Physical Exam Physical Exam  Constitutional: She is oriented to person, place, and time. She appears well-developed and well-nourished.  HENT:  Mouth/Throat: Oropharynx is clear and moist.  Eyes: Conjunctivae are normal. No scleral icterus.  Neck: Neck supple.  Cardiovascular: Normal rate, regular rhythm and normal heart sounds.   Pulmonary/Chest: Effort normal and breath sounds normal. Right breast exhibits no inverted nipple, no mass, no nipple discharge, no skin change and no tenderness. Left breast exhibits no inverted nipple, no mass, no nipple discharge, no skin change and no tenderness.  Well healed mastectomy and reconstruction incision left breast.   Lymphadenopathy:    She has no cervical adenopathy.    She has no axillary adenopathy.  Neurological: She is alert and oriented to person, place, and time.  Skin: Skin is warm and dry.  Psychiatric: Her behavior is normal.    Data Reviewed CTA and MRA studies noted above reviewed.  Right breast mammogram dated 08/02/2015 completed at [facility] was reviewed. This is an 18 month follow-up status post breast reduction/mammoplasty on the right. New amorphous linear calcifications are identified and multiple areas of the upper inner quadrant at the mid depth. BIRAD-4.  These films were independently reviewed. There is extensive progression of calcifications in the upper inner quadrant within the field disturbed during the mastoplasty. The extensive area for calcifications speak more to dystrophic changes and fat necrosis than to the development of widespread DCIS. Review of the  pathology report from the 04/08/2014 reduction mammoplasty tissue showed benign breast parenchyma.    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        Plan    In light of her present anticoagulation status in my lower index of suspicion will put consideration for the recommended biopsy on hold.     The patient is made significant progress with regards to smoking cessation down from 20 cigarettes a day to 3. All 0 would be twice a day [place] think she made excellent progress.   Follow up in 6 months right mammogram and office visit.  PCP:  Chriss Czar This information has been scribed by Karie Fetch Kaplan.   Robert Bellow 08/16/2015, 9:09 PM

## 2015-08-16 NOTE — Patient Instructions (Signed)
The patient is aware to call back for any questions or concerns.  

## 2015-08-17 ENCOUNTER — Telehealth: Payer: Self-pay | Admitting: *Deleted

## 2015-08-17 NOTE — Telephone Encounter (Signed)
-----   Message from Robert Bellow, MD sent at 08/16/2015  9:17 PM EST ----- Please find out who is following the patient's Coumadin and if she has had a study since her discharge on November 8.

## 2015-08-17 NOTE — Telephone Encounter (Signed)
Dr Amanda Cockayne in [place], last labs were Dec and she was at the MD office when I called getting labs done as well. She will have information faxed to [place] from his office.

## 2015-08-18 ENCOUNTER — Telehealth: Payer: Self-pay | Admitting: *Deleted

## 2015-08-18 ENCOUNTER — Ambulatory Visit: Payer: BLUE CROSS/BLUE SHIELD | Attending: Nurse Practitioner | Admitting: Occupational Therapy

## 2015-08-18 ENCOUNTER — Encounter: Payer: Self-pay | Admitting: Occupational Therapy

## 2015-08-18 ENCOUNTER — Other Ambulatory Visit: Payer: Self-pay | Admitting: Neurology

## 2015-08-18 DIAGNOSIS — R269 Unspecified abnormalities of gait and mobility: Secondary | ICD-10-CM | POA: Insufficient documentation

## 2015-08-18 DIAGNOSIS — I6980 Unspecified sequelae of other cerebrovascular disease: Secondary | ICD-10-CM | POA: Insufficient documentation

## 2015-08-18 DIAGNOSIS — M6281 Muscle weakness (generalized): Secondary | ICD-10-CM | POA: Insufficient documentation

## 2015-08-18 DIAGNOSIS — IMO0002 Reserved for concepts with insufficient information to code with codable children: Secondary | ICD-10-CM

## 2015-08-18 DIAGNOSIS — R279 Unspecified lack of coordination: Secondary | ICD-10-CM | POA: Insufficient documentation

## 2015-08-18 DIAGNOSIS — I63231 Cerebral infarction due to unspecified occlusion or stenosis of right carotid arteries: Secondary | ICD-10-CM

## 2015-08-18 DIAGNOSIS — R201 Hypoesthesia of skin: Secondary | ICD-10-CM | POA: Insufficient documentation

## 2015-08-18 DIAGNOSIS — R531 Weakness: Secondary | ICD-10-CM | POA: Insufficient documentation

## 2015-08-18 DIAGNOSIS — G8194 Hemiplegia, unspecified affecting left nondominant side: Secondary | ICD-10-CM | POA: Diagnosis present

## 2015-08-18 DIAGNOSIS — I63339 Cerebral infarction due to thrombosis of unspecified posterior cerebral artery: Secondary | ICD-10-CM | POA: Insufficient documentation

## 2015-08-18 NOTE — Telephone Encounter (Signed)
Pt returned call. Please call and advise °

## 2015-08-18 NOTE — Therapy (Signed)
[facility] [address] [place], [place], [address] Phone: [phone]   Fax:  [phone]  Occupational Therapy Treatment  Patient Details  Name: Tina Sharp MRN: [id number] Date of Birth: 1966-06-30 Referring Provider: Dr. Leonie Man  Encounter Date: 08/18/2015      OT End of Session - 08/18/15 1211    Visit Number 11   Number of Visits 16   Date for OT Re-Evaluation 08/25/15   Authorization Type BCBS - 30 visit limiit for OT   OT Start Time 1015   OT Stop Time 1058   OT Time Calculation (min) 43 min   Activity Tolerance Patient tolerated treatment well      Past Medical History  Diagnosis Date  . GERD (gastroesophageal reflux disease)   . 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 ER/ PR+; Her 2 neu not overexpressed, microscopic positive margin (skin).  . Hypertension   . Depression   . Anxiety   . 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 Laparoscopic hysterectomy  2010    [facility]  . Breast surgery Left 04/08/14    mastectomy  . Reduction mammoplasty Right 04/08/14    Dr Tula Nakayama  . Colonoscopy    . Ep implantable device N/A 06/14/2015    Procedure: Loop Recorder Insertion;  Surgeon: Evans Lance, MD;  Location: [facility];  Service: Cardiovascular;  Laterality: N/A;  . Tee without cardioversion N/A 06/14/2015    Procedure: TRANSESOPHAGEAL ECHOCARDIOGRAM (TEE);  Surgeon: Josue Hector, MD;  Location: [facility] ENDOSCOPY;  Service: Cardiovascular;  Laterality: N/A;  . Abdominal hysterectomy  2000    left both ovaries in  . Tonsillectomy and adenoidectomy      pt was 50 years old    There were no vitals filed for this visit.  Visit Diagnosis:  Lack of coordination due to stroke  Impaired sensation  Hemiplegia affecting left nondominant side (HCC)  Muscle  weakness      Subjective Assessment - 08/18/15 1021    Subjective  I missed my last appointment because I had a cancer scare but everything turned out ok.   Pertinent History see epic. 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   Pain Relieving Factors rest                      OT Treatments/Exercises (OP) - 08/18/15 0001    ADLs   Writing Addressed typing again today - pt with significant improvement and able to use strategies introduced at prior session. Pt focusing on accuracy at this point - requires SIGNIFICANTLY more time to type 5 sentences.     Fine Motor Coordination   In Hand Manipulation Training Checked all LTG's - see goal status.  Treatment focused on in hand manipulation with small objects,  orientaiton of hand to task, using LUE as non dominant (vs gross assist) in bilateral tasks, Emphasis on speed, accuracy and use of vision to compensate for poor sensation. Pt using vision much more spontaneously and automotically to compensate.                   OT Short Term Goals - 08/18/15 1055    OT SHORT TERM GOAL #1   Title Pt will be mod I with HEP - 07/21/2015 (adjusted as pt missed one week of theraputty due to being ill)   Status Achieved   OT SHORT TERM GOAL #2   Title Pt will be able to button and zipper clothing with increased time.   Status Achieved   OT SHORT TERM GOAL #3   Title Pt will demonstrate improved grip strength by at least 5 pounds to assist with functional tasks (baseline=15 pounds)   Status Achieved  30 pounds   OT SHORT TERM GOAL #4   Title Pt will  demonstrate improved fine motor coordination as evidenced by decreasing time on 9 hole peg test by at least 8 seconds (baseline= 54.48)   Status Achieved  45.2   OT SHORT TERM GOAL #5   Title Pt will demonstrate ability to pick up small objects with mod compensations, min dropping and increased time.    Status Achieved   OT SHORT TERM GOAL #6   Title Pt will require no more than min vc's to use vision to assist in compensation of use of L hand during functional tasks.    Status Achieved           OT Long Term Goals - 08/18/15 1055    OT LONG TERM GOAL #1   Title Pt will be mod I with upgraded HEP - 09/01/2015 (date adjusted as pt missed 3 weeks of therapy due to illness)   Status On-going   OT LONG TERM GOAL #2   Title Pt will demonstrate increased grip strength by at least 25 pounds to assist with functional tasks (baseline= 15 pounds)   Status Achieved  46 pounds   OT LONG TERM GOAL #3   Title Pt will demonstrate improved coordinaton as evidenced by decreasing time on 9 hole peg test to less than 26 seconds  (baseline= 54.48)   Status Revised  08/18/2015 26.89 met intial LTG   OT LONG TERM GOAL #4   Title Pt will be I with tying shoes using both hands   Status Achieved   OT LONG TERM GOAL #5   Title Pt will be able to use LUE as non dominant assist for basic self care at least 75% of the time   Status Achieved   OT LONG TERM GOAL #6   Title Pt will be able to orient hand approrpriately and manipulate small objects in left hand during functional tasks with min compensations.    Status Achieved   OT LONG TERM GOAL #7   Title Pt will be able to complete bilateral tasks with min compensations   Status Achieved   OT LONG TERM GOAL #8   Title Pt will be able to type 6 sentence paragrah with no more than 4 mistakes using both hands   Status Revised  08/18/2015 met initial LTG               Plan - 08/18/15 1209    Clinical Impression Statement Pt is making excellent  progress toward goals.  Pt has met all LTG  and have reviesd 2 LTG's given that pt is required to return to work next week and still is not able to meet typing demands of job. Pt in agreement.   Pt will benefit from skilled therapeutic intervention in order to improve on the following deficits (Retired) Decreased activity tolerance;Decreased coordination;Decreased knowledge of use of DME;Decreased strength;Impaired UE functional use;Impaired sensation;Pain   Rehab Potential Good   OT Frequency 2x / week   OT Duration 8 weeks   Plan fine motor, strength and speed/accuracy with typing   Consulted and Agree with Plan of Care Patient        Problem List Patient Active Problem List   Diagnosis Date Noted  . 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 Stroke with cerebral ischemia ([place]) 07/12/2015  . HLD (hyperlipidemia) 07/12/2015  . CVA (cerebral infarction) 06/21/2015  . TIA (transient ischemic attack) 06/21/2015  . Macrocytic anemia 06/20/2015  . Headache 06/14/2015  . Cigarette smoker 06/14/2015  . Stroke (cerebrum) ([place]) cryptogenic R brain infarcts s/p IV tPA  06/11/2015  . Malignant neoplasm of breast (female), unspecified site 04/23/2014  . Breast cancer ([place]) 02/20/2014    Quay Burow, OTR/L 08/18/2015, 12:12 PM  [facility] [address] [place], [place], [address] Phone: [phone]   Fax:  [phone]  Name: LEONE MOBLEY MRN: [id number] Date of Birth: 01/24/1966

## 2015-08-18 NOTE — Telephone Encounter (Signed)
Rn call patient back stated her job still needs a letter fax to Attention: [employer] [phone] stating she can return to work January 10, with full duty and no restrictions.

## 2015-08-18 NOTE — Telephone Encounter (Signed)
LFt vm for patient that the return date was on her FMLA form. Need call from patient.

## 2015-08-22 ENCOUNTER — Ambulatory Visit: Payer: BLUE CROSS/BLUE SHIELD | Admitting: Occupational Therapy

## 2015-08-23 ENCOUNTER — Telehealth: Payer: Self-pay | Admitting: Neurology

## 2015-08-23 NOTE — Telephone Encounter (Signed)
Patient is calling and states that her work did receive her fax.  Thanks!

## 2015-08-23 NOTE — Telephone Encounter (Signed)
Letter fax to [place] receive and confirm.

## 2015-08-23 NOTE — Telephone Encounter (Signed)
Rn call patient about letter back to work. Pt stated the letter can state return to work on January, 11,2017. Letter will be fax to [employer] at 1 [phone]

## 2015-08-23 NOTE — Telephone Encounter (Signed)
Rn call patient to let her know that the return to work letter was fax and receive. 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 Pt will call to verify.

## 2015-08-25 ENCOUNTER — Ambulatory Visit: Payer: BLUE CROSS/BLUE SHIELD | Admitting: Occupational Therapy

## 2015-08-25 DIAGNOSIS — M6281 Muscle weakness (generalized): Secondary | ICD-10-CM

## 2015-08-25 DIAGNOSIS — R201 Hypoesthesia of skin: Secondary | ICD-10-CM

## 2015-08-25 DIAGNOSIS — G8194 Hemiplegia, unspecified affecting left nondominant side: Secondary | ICD-10-CM

## 2015-08-25 DIAGNOSIS — I6980 Unspecified sequelae of other cerebrovascular disease: Secondary | ICD-10-CM | POA: Diagnosis not present

## 2015-08-25 DIAGNOSIS — IMO0002 Reserved for concepts with insufficient information to code with codable children: Secondary | ICD-10-CM

## 2015-08-25 NOTE — Patient Instructions (Signed)
How to increase the challenge of your home program for your hand:  Coordination tasks: 1. Decrease the size of the objects you are trying to manipulate - the smaller the objects the greater the challenge.   2. Increase your speed in doing task while maintaining the accuracy. 3. Increase the amount of time you spend doing the program over the course of the week. 4. Returning to work in and of itself will provide more opportunity to work on fine motor given what you do at work.  Strengthening Program: You are currently using the red putty. You have been issued the green now which is the next level up. 1. Pick one activity from your program and do with the green. Start with the gross grasp one (roll a fat hot dog and squeeze as hard as you can).  Do the remaining ones with the red putty. 2. Slowly add additional activities from your program to using the green - your eventual goal is to do all of the activities except the pinch ones with the green. This should take 2-3 weeks to convert to the green putty. Make sure you use the red for the pinch activities. 3.  Increase the reps of the various activities - don't increase all of them at once - pick one. You can vary which one you pick.  Once this is comfortable you can slowly increase the reps for all of them one at time. This in turn will increase the time you spend on the program.   Continue to focus on using the hand functionally as much as possible - make sure you don't "favor" the hand, even if it takes you a little longer to do a task.  Functional use is always better than exercises! The more you use it, the more you will be able to use it!!!  It has been a pleasure to work with you!

## 2015-08-25 NOTE — Therapy (Signed)
[facility] [address] [place], [place], [address] Phone: [phone]   Fax:  [phone]  Occupational Therapy Treatment  Patient Details  Name: Tina Sharp MRN: [id number] Date of Birth: 06/30/1966 Referring Provider: Dr. Leonie Man  Encounter Date: 08/25/2015      OT End of Session - 08/25/15 1657    Visit Number 12   Number of Visits 16   Date for OT Re-Evaluation 08/25/15   Authorization Type BCBS - 10 combined for OT/PT   Authorization - Visit Number 2   Authorization - Number of Visits 30  30 combined for OT/PT   OT Start Time 2947   OT Stop Time 1651   OT Time Calculation (min) 36 min      Past Medical History  Diagnosis Date  . GERD (gastroesophageal reflux disease)   . Cancer [facility]) June 2015    Invasive lobular carcinoma, 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 ER/ PR+; Her 2 neu not overexpressed, microscopic positive margin (skin).  . Hypertension   . Depression   . Anxiety   . 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 Laparoscopic hysterectomy  2010    [facility]  . Breast surgery Left 04/08/14    mastectomy  . Reduction mammoplasty Right 04/08/14    Dr Tula Nakayama  . Colonoscopy    . Ep implantable device N/A 06/14/2015    Procedure: Loop Recorder Insertion;  Surgeon: Evans Lance, MD;  Location: [facility] CV LAB;  Service: Cardiovascular;  Laterality: N/A;  . Tee without cardioversion N/A 06/14/2015    Procedure: TRANSESOPHAGEAL ECHOCARDIOGRAM (TEE);  Surgeon: Josue Hector, MD;  Location: [facility] ENDOSCOPY;  Service: Cardiovascular;  Laterality: N/A;  . Abdominal hysterectomy  2000    left both ovaries in  . Tonsillectomy and adenoidectomy      pt was 50 years old    There were no vitals filed for this visit.  Visit Diagnosis:  Lack of coordination due to stroke  Impaired sensation  Hemiplegia  affecting left nondominant side (HCC)  Muscle weakness      Subjective Assessment - 08/25/15 1617    Subjective  I started back to work on Wednesday   Pertinent History see epic. Pt with R CVA and resultant LUE sensory impairment and hand weakness   Patient Stated Goals I want to be able to type again for work                       OT Treatments/Exercises (OP) - 08/25/15 0001    ADLs   Writing Addressed speed and accuracy with typing activity. Pt able to use strategies effectively and was able to type 6 sentences in shorter period of time with no mistakes.    Hand Exercises   Other Hand Exercises Provided pt with written and verbal instructions on how to increase HEP in prepartion for d/c from OT. Pt also issued green putty in order to advance HEP.  Pt able to verbalize understanding as well as to return demonstrate where appropriate. See pt instruction section for details.                 OT Education - 08/25/15 1655    Education provided Yes   Education Details how to advance HEP for RUE in prep for d/c   Person(s) Educated Patient   Methods Explanation;Demonstration;Verbal  cues;Handout   Comprehension Verbalized understanding;Returned demonstration          OT Short Term Goals - 08/25/15 1655    OT SHORT TERM GOAL #1   Title Pt will be mod I with HEP - 07/21/2015 (adjusted as pt missed one week of theraputty due to being ill)   Status Achieved   OT SHORT TERM GOAL #2   Title Pt will be able to button and zipper clothing with increased time.   Status Achieved   OT SHORT TERM GOAL #3   Title Pt will demonstrate improved grip strength by at least 5 pounds to assist with functional tasks (baseline=15 pounds)   Status Achieved  30 pounds   OT SHORT TERM GOAL #4   Title Pt will demonstrate improved fine motor coordination as evidenced by decreasing time on 9 hole peg test by at least 8 seconds (baseline= 54.48)   Status Achieved  45.2   OT SHORT TERM  GOAL #5   Title Pt will demonstrate ability to pick up small objects with mod compensations, min dropping and increased time.    Status Achieved   OT SHORT TERM GOAL #6   Title Pt will require no more than min vc's to use vision to assist in compensation of use of L hand during functional tasks.    Status Achieved           OT Long Term Goals - 08/25/15 1655    OT LONG TERM GOAL #1   Title Pt will be mod I with upgraded HEP - 09/01/2015 (date adjusted as pt missed 3 weeks of therapy due to illness)   Status Achieved   OT LONG TERM GOAL #2   Title Pt will demonstrate increased grip strength by at least 25 pounds to assist with functional tasks (baseline= 15 pounds)   Status Achieved  46 pounds   OT LONG TERM GOAL #3   Title Pt will demonstrate improved coordinaton as evidenced by decreasing time on 9 hole peg test to less than 26 seconds  (baseline= 54.48)   Status Achieved  08/18/2015 26.89 met intial LTG  08/25/2015  24.25   OT LONG TERM GOAL #4   Title Pt will be I with tying shoes using both hands   Status Achieved   OT LONG TERM GOAL #5   Title Pt will be able to use LUE as non dominant assist for basic self care at least 75% of the time   Status Achieved   OT LONG TERM GOAL #6   Title Pt will be able to orient hand approrpriately and manipulate small objects in left hand during functional tasks with min compensations.    Status Achieved   OT LONG TERM GOAL #7   Title Pt will be able to complete bilateral tasks with min compensations   Status Achieved   OT LONG TERM GOAL #8   Title Pt will be able to type 6 sentence paragrah with no more than 4 mistakes using both hands   Status Achieved  08/18/2015 met initial LTG               Plan - 08/25/15 1656    Clinical Impression Statement Pt has met all goals for OT and is ready for discharge. Pt educated on how to advance HEP.  Pt in agreement with plan.   Pt will benefit from skilled therapeutic intervention in order to  improve on the following deficits (Retired) Decreased activity tolerance;Decreased coordination;Decreased knowledge of use  of DME;Decreased strength;Impaired UE functional use;Impaired sensation;Pain   Rehab Potential Good   OT Frequency 2x / week   OT Duration 8 weeks   OT Treatment/Interventions Self-care/ADL training;Moist Heat;Electrical Stimulation;Fluidtherapy;DME and/or AE instruction;Neuromuscular education;Therapeutic exercise;Manual Therapy;Splinting;Therapeutic activities;Patient/family education   Plan d/c from OT.     Recommended Other Services Obtained PT order and pt scheduled for eval next week.        Problem List Patient Active Problem List   Diagnosis Date Noted  . Cerebrovascular accident (CVA) due to thrombosis of posterior cerebral artery ([place]) 08/18/2015  . 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 Stroke with cerebral ischemia ([place]) 07/12/2015  . HLD (hyperlipidemia) 07/12/2015  . CVA (cerebral infarction) 06/21/2015  . TIA (transient ischemic attack) 06/21/2015  . Macrocytic anemia 06/20/2015  . Headache 06/14/2015  . Cigarette smoker 06/14/2015  . Stroke (cerebrum) ([place]) cryptogenic R brain infarcts s/p IV tPA  06/11/2015  . Malignant neoplasm of breast (female), unspecified site 04/23/2014  . Breast cancer ([place]) 02/20/2014   OCCUPATIONAL THERAPY DISCHARGE SUMMARY  Visits from Start of Care: 12  Current functional level related to goals / functional outcomes: See above goals   Remaining deficits: Sensory impairment   Education / Equipment: HEP Plan: Patient agrees to discharge.  Patient goals were met. Patient is being discharged due to meeting the stated rehab goals.  ?????     Quay Burow, OTR/L 08/25/2015, 5:01 PM  [facility] [address] [place], [place], [address] Phone: [phone]   Fax:  [phone]  Name: CORNELIOUS DIVEN MRN: [id number] Date of Birth:  1966/04/02

## 2015-08-29 ENCOUNTER — Telehealth: Payer: Self-pay | Admitting: Neurology

## 2015-08-29 NOTE — Telephone Encounter (Signed)
Pt called said she was not doing well. She said she was having numbness from the knee up to midway of leg bil. Pt began to cry. Said she was having difficulty walking. I skyped Katrina and she spoke to patient.

## 2015-08-29 NOTE — Telephone Encounter (Addendum)
Rn receiving incoming call from [facility] about having numbness in her legs from knee to midway.Patient stated she has been experiencing this for two weeks. 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 Per epic notes pt was seen by occupational therapy on 08-25-15 for her left hand evaluation. Per the therapy note, there is no documentation of having both legs weak,and unable to walk at times. Rn stated if she is having new symptoms she needs to seek the nearest ED. Rn advised about about the Ed. Rn talk to patient last week about returning to work and patient did not report leg weakness or numbness. Per OT note pt has return to work.

## 2015-08-30 ENCOUNTER — Ambulatory Visit: Payer: BLUE CROSS/BLUE SHIELD | Admitting: Rehabilitation

## 2015-08-31 NOTE — Telephone Encounter (Signed)
Rn call patient and she was at work.Rn was following up the phone call from Monday when pt call and stated her legs were numb and weak. Pt stated she her PCP Chriss Czar and he evaluated her and could not find anything. Pt stated she thinks it came from being home for 3 months and not moving around. Pt stated she feels better and needs to be more active. Pt is back at work full time. She will call therapy about maybe getting some PT exercises.

## 2015-09-01 ENCOUNTER — Encounter: Payer: BLUE CROSS/BLUE SHIELD | Admitting: Occupational Therapy

## 2015-09-02 ENCOUNTER — Ambulatory Visit: Payer: BLUE CROSS/BLUE SHIELD | Admitting: Physical Therapy

## 2015-09-02 ENCOUNTER — Encounter: Payer: Self-pay | Admitting: Physical Therapy

## 2015-09-02 VITALS — BP 142/101 | HR 101

## 2015-09-02 DIAGNOSIS — I6980 Unspecified sequelae of other cerebrovascular disease: Secondary | ICD-10-CM | POA: Diagnosis not present

## 2015-09-02 DIAGNOSIS — R201 Hypoesthesia of skin: Secondary | ICD-10-CM

## 2015-09-02 DIAGNOSIS — R269 Unspecified abnormalities of gait and mobility: Secondary | ICD-10-CM

## 2015-09-02 DIAGNOSIS — R531 Weakness: Secondary | ICD-10-CM

## 2015-09-03 NOTE — Therapy (Addendum)
[facility] [address] [place], [place], [address] Phone: [phone]   Fax:  [phone]  Physical Therapy Evaluation  Patient Details  Name: Tina Sharp MRN: [id number] Date of Birth: 02/22/1966 Referring Provider: Antony Contras, MD  Encounter Date: 09/02/2015      PT End of Session - 09/03/15 1342    Visit Number 1   Number of Visits 9  eval + 8 viists   Date for PT Re-Evaluation 10/02/15   Authorization Type BCBS   PT Start Time 1532   PT Stop Time 1624   PT Time Calculation (min) 52 min   Activity Tolerance Patient limited by fatigue   Behavior During Therapy [facility] for tasks assessed/performed      Past Medical History  Diagnosis Date  . GERD (gastroesophageal reflux disease)   . Cancer [facility]) June 2015    Invasive lobular carcinoma, 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 ER/ PR+; Her 2 neu not overexpressed, microscopic positive margin (skin).  . Hypertension   . Depression   . Anxiety   . 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 Laparoscopic hysterectomy  2010    [facility]  . Breast surgery Left 04/08/14    mastectomy  . Reduction mammoplasty Right 04/08/14    Dr Tula Nakayama  . Colonoscopy    . Ep implantable device N/A 06/14/2015    Procedure: Loop Recorder Insertion;  Surgeon: Evans Lance, MD;  Location: [facility] CV LAB;  Service: Cardiovascular;  Laterality: N/A;  . Tee without cardioversion N/A 06/14/2015    Procedure: TRANSESOPHAGEAL ECHOCARDIOGRAM (TEE);  Surgeon: Josue Hector, MD;  Location: [facility] ENDOSCOPY;  Service: Cardiovascular;  Laterality: N/A;  . Abdominal hysterectomy  2000    left both ovaries in  . Tonsillectomy and adenoidectomy      pt was 50 years old    Filed Vitals:   09/02/15 1630  BP: 142/101  Pulse: 101  SpO2: 96%    Visit Diagnosis:  Weakness generalized - Plan: PT plan of  care cert/re-cert  Abnormality of gait - Plan: PT plan of care cert/re-cert  Impaired sensation - Plan: PT plan of care cert/re-cert      Subjective Assessment - 09/02/15 1538    Subjective Pt has noticed unsteadiness when walking. Reports hypersensitivity in B anterior thighs. States, "to step up on a step is darn near impossible.." Pt returned to work on 08/23/15 and since returning to work, pt's legs have felt weaker. Pt had L-sided weakness (on LUE > LLE s/p CVA.Marland Kitchen   Pertinent History PMH significant for: CVA (06/10/16) with cryptogenic R brain infarcts s/p IV TPA; HLD; breast cancer s/p L mastectomy   Limitations Walking   Patient Stated Goals "To make me walk better."   Currently in Pain? No/denies            [facility] PT Assessment - 09/03/15 0001    Assessment   Medical Diagnosis abnormality of gait   Onset Date/Surgical Date 08/23/15   Hand Dominance Right   Precautions   Precautions Fall;Other (comment)   Precaution Comments BP on R arm only   Restrictions   Weight Bearing Restrictions No   Home Environment   Living Environment Private residence   Living Arrangements Spouse/significant other  and daughter   Available Help at Discharge Family   Type of [place]  to enter   Entrance Stairs-Number of Steps 3   Entrance Stairs-Rails None   Home Layout One level   Home Equipment None   Additional Comments No difficulty with steps to enter home, per pt   Prior Function   Level of Independence Independent   Vocation Full time employment   Actor   Leisure shop; visit family; play with 23 and 59-year old grandchildren   Cognition   Overall Cognitive Status Within Functional Limits for tasks assessed   Observation/Other Assessments   Observations LE numbess unchanged thoracic/lumbar spine position, repeated movements.   Sensation   Light Touch Impaired Detail   Light Touch Impaired Details Impaired RLE;Impaired LLE    Proprioception Appears Intact   Additional Comments Pt reports numbness and hypersensivity in anterior aspect of B thighs   Coordination   Gross Motor Movements are Fluid and Coordinated Yes   Heel Shin Test Grossly intact and symmetrical (quality, excursion) of movement in BLE's.   AROM   Overall AROM  Within functional limits for tasks performed   Strength   Overall Strength Deficits   Right Hip Flexion 3+/5   Right Hip Extension 3-/5   Right Hip ABduction 4-/5   Left Hip Flexion 2+/5   Left Hip Extension 3-/5   Left Hip ABduction 4/5   Right Knee Flexion 4+/5   Right Knee Extension 3+/5   Left Knee Flexion 4/5   Left Knee Extension 4-/5   Right Ankle Dorsiflexion 4+/5   Right Ankle Plantar Flexion 4/5   Left Ankle Dorsiflexion 4+/5   Left Ankle Plantar Flexion 4+/5   Transfers   Transfers Sit to Stand;Stand to Sit   Sit to Stand 6: Modified independent (Device/Increase time)  increased time   Five time sit to stand comments  37.07 sec without UE use   Stand to Sit 6: Modified independent (Device/Increase time)   Ambulation/Gait   Ambulation/Gait Yes   Ambulation/Gait Assistance 5: Supervision;6: Modified independent (Device/Increase time)   Ambulation Distance (Feet) 789 Feet  on 6MWT   Assistive device None   Gait Pattern Step-through pattern;Decreased stride length;Decreased hip/knee flexion - right;Decreased hip/knee flexion - left;Right foot flat;Left foot flat   Gait velocity 2.02 ft/sec   6 Minute walk- Post Test   BP (mmHg) (!) 142/101 mmHg   HR (bpm) 101   02 Sat (%RA) 96 %   6 minute walk test results    Aerobic Endurance Distance Walked 789   Endurance additional comments Norms for community dwelling females (age 72-69) range from 1,329' to 1,765'   LUE Tone   LUE Tone Within Functional Limits                           PT Education - 09/03/15 1320    Education provided Yes   Education Details PT evaluation findings, goals, and POC.  Recommended pt check BP often and notify MD if BP consistently > 130/90. Also recommended pt seek immediate medical attention if symptoms of LE weakness, numbness worsen.   Person(s) Educated Patient   Methods Explanation   Comprehension Verbalized understanding          PT Short Term Goals - 09/03/15 1405    PT SHORT TERM GOAL #1   Title STG's = LTG's           PT Long Term Goals - 09/03/15 1406    PT LONG TERM GOAL #1   Title Pt  will independently demo initial HEP to indicate daily compliance, maximize functional gains made in PT. Target date: 09/30/15   Time --   Period --   PT LONG TERM GOAL #2   Title Pt will improve 5 Times Sit to Stand Test from 37.07 seconds to < / = 22 seconds to indicate improved functional LE strength, progress toward decreased fall risk. Target date: 09/30/15    Time --   Period --   PT LONG TERM GOAL #3   Title Pt will increase 6MWT distance from 60' to 902' to indicate significant impovement in functional endurance. Target date: 09/30/15   Time --   Period --   PT LONG TERM GOAL #4   Title Pt will improve gait velocity from 2.02 ft/sec to > 2.62 ft/sec to indicate functional status of community ambulator. Target date: 09/30/15               Plan - 09/03/15 1345    Clinical Impression Statement Pt is a 50 y/o F referred to outpatient PT to address abnormality of gait as well as weakness, numbess, and hypersensitivity in B LE's which began on 08/23/15. PMH significant for CVA (06/10/16) with cryptogenic R brain infarcts s/p IV TPA, breast cancer s/p L mastectomy, HLD. PT evaluation reveals the following: gait impairments; BLE weakness (most noteable proximally); gait velocity suggestive of functional status of limited community ambulator; significant limitation in functional endurance, per 6MWT distance; 5x STS time indicative of increased risk of recurrent falls; and sensory disturbance/numbess at anterior aspect of B thighs. Sensory disturbance  did not change with change in thoracic/lumbar spine position or with repeated movements.    Pt will benefit from skilled therapeutic intervention in order to improve on the following deficits Abnormal gait;Decreased activity tolerance;Decreased endurance;Impaired sensation;Decreased strength   Rehab Potential Good   Clinical Impairments Affecting Rehab Potential works FT; increased distance/driving time to PT clinic from both home and work   PT Frequency 2x / week  Pt may decide to schedule 1x/week due to distance from home/work   PT Duration 4 weeks   PT Treatment/Interventions ADLs/Self Care Home Management;Gait training;Stair training;Functional mobility training;Therapeutic activities;Therapeutic exercise;Patient/family education;Neuromuscular re-education   PT Next Visit Plan Initiate HEP, gait training, LE strengthening, functional endurance; consider walking program   Consulted and Agree with Plan of Care Patient         Problem List Patient Active Problem List   Diagnosis Date Noted  . Cerebrovascular accident (CVA) due to thrombosis of posterior cerebral artery ([place]) 08/18/2015  . 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 Stroke with cerebral ischemia ([place]) 07/12/2015  . HLD (hyperlipidemia) 07/12/2015  . CVA (cerebral infarction) 06/21/2015  . TIA (transient ischemic attack) 06/21/2015  . Macrocytic anemia 06/20/2015  . Headache 06/14/2015  . Cigarette smoker 06/14/2015  . Stroke (cerebrum) ([place]) cryptogenic R brain infarcts s/p IV tPA  06/11/2015  . Malignant neoplasm of breast (female), unspecified site 04/23/2014  . Breast cancer ([place]) 02/20/2014    Billie Ruddy, PT, [facility] [address] [place], [place], [address] Phone: [phone]   Fax:  [phone] 09/03/2015, 2:24 PM  Name: Tina Sharp MRN: [id number] Date of Birth: [birth year]

## 2015-09-05 ENCOUNTER — Emergency Department: Payer: BLUE CROSS/BLUE SHIELD

## 2015-09-05 ENCOUNTER — Emergency Department
Admission: EM | Admit: 2015-09-05 | Discharge: 2015-09-05 | Disposition: A | Discharge status: Home / Self Care | Payer: BLUE CROSS/BLUE SHIELD | Attending: Emergency Medicine | Admitting: Emergency Medicine

## 2015-09-05 ENCOUNTER — Telehealth: Payer: Self-pay | Admitting: Physical Therapy

## 2015-09-05 ENCOUNTER — Encounter: Payer: BLUE CROSS/BLUE SHIELD | Admitting: Occupational Therapy

## 2015-09-05 ENCOUNTER — Encounter: Payer: Self-pay | Admitting: Emergency Medicine

## 2015-09-05 DIAGNOSIS — F17210 Nicotine dependence, cigarettes, uncomplicated: Secondary | ICD-10-CM | POA: Insufficient documentation

## 2015-09-05 DIAGNOSIS — I10 Essential (primary) hypertension: Secondary | ICD-10-CM | POA: Insufficient documentation

## 2015-09-05 DIAGNOSIS — R200 Anesthesia of skin: Secondary | ICD-10-CM | POA: Diagnosis present

## 2015-09-05 DIAGNOSIS — Z7901 Long term (current) use of anticoagulants: Secondary | ICD-10-CM | POA: Diagnosis not present

## 2015-09-05 DIAGNOSIS — Z79899 Other long term (current) drug therapy: Secondary | ICD-10-CM | POA: Diagnosis not present

## 2015-09-05 LAB — CBC
HCT: 43.4 % (ref 35.0–47.0)
HEMOGLOBIN: 14.5 g/dL (ref 12.0–16.0)
MCH: 33 pg (ref 26.0–34.0)
MCHC: 33.5 g/dL (ref 32.0–36.0)
MCV: 98.5 fL (ref 80.0–100.0)
Platelets: 261 10*3/uL (ref 150–440)
RBC: 4.4 MIL/uL (ref 3.80–5.20)
RDW: 20.7 % — ABNORMAL HIGH (ref 11.5–14.5)
WBC: 5.7 10*3/uL (ref 3.6–11.0)

## 2015-09-05 LAB — COMPREHENSIVE METABOLIC PANEL
ALT: 53 U/L (ref 14–54)
AST: 101 U/L — AB (ref 15–41)
Albumin: 3 g/dL — ABNORMAL LOW (ref 3.5–5.0)
Alkaline Phosphatase: 87 U/L (ref 38–126)
Anion gap: 10 (ref 5–15)
BUN: 5 mg/dL — AB (ref 6–20)
CHLORIDE: 107 mmol/L (ref 101–111)
CO2: 27 mmol/L (ref 22–32)
CREATININE: 0.56 mg/dL (ref 0.44–1.00)
Calcium: 8.1 mg/dL — ABNORMAL LOW (ref 8.9–10.3)
GFR calc Af Amer: >60 mL/min (ref >60)
GFR calc non Af Amer: >60 mL/min (ref >60)
Glucose, Bld: 94 mg/dL (ref 65–99)
Potassium: 3.4 mmol/L — ABNORMAL LOW (ref 3.5–5.1)
SODIUM: 144 mmol/L (ref 135–145)
Total Bilirubin: 0.5 mg/dL (ref 0.3–1.2)
Total Protein: 6.3 g/dL — ABNORMAL LOW (ref 6.5–8.1)

## 2015-09-05 NOTE — Telephone Encounter (Signed)
Patient called requesting to see Dr. Erlinda Hong ASAP, states work is making her take early retirement. Patient is very tearful on the phone.

## 2015-09-05 NOTE — Discharge Instructions (Signed)
Please return to the emergency department if you develop severe headache, new numbness or tingling, weakness, difficulty walking, low back pain, changes in bladder or bowel function, or any other symptoms concerning to you.  Paresthesia Paresthesia is a burning or prickling feeling. This feeling can happen in any part of the body. It often happens in the hands, arms, legs, or feet. Usually, it is not painful. In most cases, the feeling goes away in a short time and is not a sign of a serious problem. HOME CARE  Avoid drinking alcohol.  Try massage or needle therapy (acupuncture) to help with your problems.  Keep all follow-up visits as told by your doctor. This is important. GET HELP IF:  You keep on having episodes of paresthesia.  Your burning or prickling feeling gets worse when you walk.  You have pain or cramps.  You feel dizzy.  You have a rash. GET HELP RIGHT AWAY IF:  You feel weak.  You have trouble walking or moving.  You have problems speaking, understanding, or seeing.  You feel confused.  You cannot control when you pee (urinate) or poop (bowel movement).  You lose feeling (numbness) after an injury.  You pass out (faint).   This information is not intended to replace advice given to you by your health care provider. Make sure you discuss any questions you have with your health care provider.   Document Released: 07/12/2008 Document Revised: 12/14/2014 Document Reviewed: 07/26/2014 Elsevier Interactive Patient Education [employer].

## 2015-09-05 NOTE — ED Notes (Signed)
States had a stroke around [place] in 2016.  Has residual numbness to left fingers/hand.  States about 2 weeks started having numbness to both legs, unable to be seen by neurologist.  Has been to PT and they weren't able to help with leg numbness.  States numbness has not gotten to the point that it hurts to walk and that's what brought her to ED today.

## 2015-09-05 NOTE — Telephone Encounter (Signed)
Patient contacted this PT this morning and left voicemail to return call. When this therapist called back, patient crying, reporting inability to go to work this AM due to increasingly worsening weakness in bilateral LE's. 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 Therefore, recommended patient seek immediate medical attention. Pt verbalized understanding.  Billie Ruddy, PT, DPT [facility] [address] [place], [place], [address] Phone: [phone]   Fax:  [phone] 09/05/2015, 12:06 PM

## 2015-09-05 NOTE — ED Notes (Signed)
MD at bedside. 

## 2015-09-05 NOTE — Telephone Encounter (Signed)
Rn call patient and put her on the schedule for 1030am. Pt stated he mom can bring her tomorrow for the appt.

## 2015-09-05 NOTE — Telephone Encounter (Signed)
Hi, Katrina:  Could you please call the pt that I can see her tomorrow 10:30am? Thanks.  Rosalin Hawking, MD PhD Stroke Neurology 09/05/2015 1:40 PM

## 2015-09-05 NOTE — ED Provider Notes (Signed)
[facility] Emergency Department Provider Note  ____________________________________________  Time seen: Approximately 4:34 PM  I have reviewed the triage vital signs and the nursing notes.   HISTORY  Chief Complaint Numbness    HPI Tina Sharp is a 50 y.o. female with a history of CVA status post TPA 10/16, HTN, presenting with bilateral lower extremity numbness. The patient notes that over the past several weeks she's been having a constant "throbbing numbness" on the anterior thighs from the knee to the proximal thigh. At night, when she lays down, she also has numbness from the ankle to the knee. The numbness does not involve her feet. She is concerned that these symptoms will inhibit her ability to walk and therefore work. She has tried to see a neurologist but is unable to get an appointment anytime soon. She has no new symptoms today. No trauma. No back pain. No saddle anesthesia, urinary or fecal incontinence or retention, fever.No weakness or gait instability.   Past Medical History  Diagnosis Date  . GERD (gastroesophageal reflux disease)   . Cancer [facility]) June 2015    Invasive lobular carcinoma, 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 ER/ PR+; Her 2 neu not overexpressed, microscopic positive margin (skin).  . Hypertension   . Depression   . Anxiety   . Stroke ([place]) 06/11/2015    cerebrum, cryptogenic right brain infarcts s/p IV TPA    Patient Active Problem List   Diagnosis Date Noted  . Cerebrovascular accident (CVA) due to thrombosis of posterior cerebral artery ([place]) 08/18/2015  . 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 Stroke with cerebral ischemia ([place]) 07/12/2015  . HLD (hyperlipidemia) 07/12/2015  . CVA (cerebral infarction) 06/21/2015  . TIA (transient ischemic attack) 06/21/2015  . Macrocytic anemia 06/20/2015  . Headache 06/14/2015  . Cigarette smoker 06/14/2015  . Stroke (cerebrum) ([place]) cryptogenic R brain infarcts s/p IV tPA   06/11/2015  . Malignant neoplasm of breast (female), unspecified site 04/23/2014  . Breast cancer ([place]) 02/20/2014    Past Surgical History  Procedure Laterality Date  . Laparoscopic hysterectomy  2010    [facility]  . Breast surgery Left 04/08/14    mastectomy  . Reduction mammoplasty Right 04/08/14    Dr Tula Nakayama  . Colonoscopy    . Ep implantable device N/A 06/14/2015    Procedure: Loop Recorder Insertion;  Surgeon: Evans Lance, MD;  Location: [facility];  Service: Cardiovascular;  Laterality: N/A;  . Tee without cardioversion N/A 06/14/2015    Procedure: TRANSESOPHAGEAL ECHOCARDIOGRAM (TEE);  Surgeon: Josue Hector, MD;  Location: [facility] ENDOSCOPY;  Service: Cardiovascular;  Laterality: N/A;  . Abdominal hysterectomy  2000    left both ovaries in  . Tonsillectomy and adenoidectomy      pt was 50 years old    Current Outpatient Rx  Name  Route  Sig  Dispense  Refill  . anastrozole (ARIMIDEX) 1 MG tablet   Oral   Take 1 tablet (1 mg total) by mouth every evening.   7 tablet   0   . clonazePAM (KLONOPIN) 0.5 MG tablet   Oral   Take 0.5 mg by mouth 2 (two) times daily as needed for anxiety.         Marland Kitchen esomeprazole (NEXIUM) 20 MG capsule   Oral   Take 20 mg by mouth daily as needed (for heartburn).          . folic acid (FOLVITE) 1 MG tablet   Oral  Take 1 tablet (1 mg total) by mouth daily.   30 tablet   0   . Hypromellose (ARTIFICIAL TEARS OP)   Ophthalmic   Apply 2 drops to eye 2 (two) times daily as needed (for dry eyes).         . prochlorperazine (COMPAZINE) 5 MG tablet   Oral   Take 5 mg by mouth every 6 (six) hours as needed for nausea or vomiting.         . traMADol (ULTRAM) 50 MG tablet   Oral   Take 0.5 tablets (25 mg total) by mouth every 6 (six) hours as needed.   30 tablet   2   . vitamin B-12 500 MCG tablet   Oral   Take 1 tablet (500 mcg total) by mouth daily.   30 tablet   0   . vitamin B-6 (PYRIDOXINE) 25 MG tablet    Oral   Take 1 tablet (25 mg total) by mouth daily.   90 tablet   3   . warfarin (COUMADIN) 5 MG tablet   Oral   Take 1 tablet (5 mg total) by mouth daily at 6 PM. Patient taking differently: Take 5 mg by mouth daily at 6 PM. 1/2 pill daily   30 tablet   1     Allergies Review of patient's allergies indicates no known allergies.  Family History  Problem Relation Age of Onset  . Breast cancer Mother   . Breast cancer Maternal Grandmother   . Cancer Maternal Grandfather 50    prostate  . Ovarian cancer Other   . Seizures Paternal Grandmother   . Seizures Paternal Grandfather     Social History Social History  Substance Use Topics  . Smoking status: Current Every Day Smoker -- 0.25 packs/day    Types: Cigarettes  . Smokeless tobacco: Never Used     Comment: E cig  . Alcohol Use: 0.0 oz/week    0 Standard drinks or equivalent per week     Comment: social    Review of Systems Constitutional: No fever/chills. No lightheadedness or syncope. No trauma. Eyes: No visual changes. ENT: No sore throat. Cardiovascular: Denies chest pain, palpitations. Respiratory: Denies shortness of breath.  No cough. Gastrointestinal: No abdominal pain.  No nausea, no vomiting.  No diarrhea.  No constipation. Genitourinary: Negative for dysuria. Musculoskeletal: Negative for back pain. Skin: Negative for rash. Neurological: Negative for headache. Negative for focal weakness. Negative for gait instability. Positive for numbness in the bilateral lower extremities. Positive for occasional numbness in the fingertips bilaterally. No urinary or fecal incontinence or retention.  10-point ROS otherwise negative.  ____________________________________________   PHYSICAL EXAM:  VITAL SIGNS: ED Triage Vitals  Enc Vitals Group     BP 09/05/15 1502 132/86 mmHg     Pulse Rate 09/05/15 1502 101     Resp 09/05/15 1502 20     Temp 09/05/15 1502 98.5 F (36.9 C)     Temp Source 09/05/15 1502 Oral      SpO2 09/05/15 1502 95 %     Weight 09/05/15 1502 143 lb (64.864 kg)     Height 09/05/15 1502 5\' 2"  (1.575 m)     Head Cir --      Peak Flow --      Pain Score 09/05/15 1502 5     Pain Loc --      Pain Edu? --      Excl. in [place]? --     Constitutional:  Alert and oriented. Well appearing and in no acute distress. Answer question appropriately. Eyes: Conjunctivae are normal.  EOMI. PERRLA. Head: Atraumatic. Nose: No congestion/rhinnorhea. Mouth/Throat: Mucous membranes are moist.  Neck: No stridor.  Supple.  No JVD. Cardiovascular: Normal rate, regular rhythm. No murmurs, rubs or gallops.  Respiratory: Normal respiratory effort.  No retractions. Lungs CTAB.  No wheezes, rales or ronchi. Gastrointestinal: Soft and nontender. No distention. No peritoneal signs. Musculoskeletal: No LE edema. No palpable cords, tenderness to palpation in the calves. Neurologic: Alert and oriented 3. Speech is clear. Face and smile symmetric. EOMI and PERRLA. Tongue is midline. No pronator drift. 5 out of 5 grip, biceps, triceps, hip flexors, plantar flexion and dorsiflexion. Normal sensation to light touch in the bilateral upper and lower extremities, and face except for "a sandpaper feeling" with palpation of the bilateral thighs on the anterior aspect. Normal gait without ataxia.  Skin:  Skin is warm, dry and intact. No rash noted. Psychiatric: Mood and affect are normal. Speech and behavior are normal.  Normal judgement.  ____________________________________________   LABS (all labs ordered are listed, but only abnormal results are displayed)  Labs Reviewed  CBC - Abnormal; Notable for the following:    RDW 20.7 (*)    All other components within normal limits  COMPREHENSIVE METABOLIC PANEL - Abnormal; Notable for the following:    Potassium 3.4 (*)    BUN 5 (*)    Calcium 8.1 (*)    Total Protein 6.3 (*)    Albumin 3.0 (*)    AST 101 (*)    All other components within normal limits   URINALYSIS COMPLETEWITH MICROSCOPIC ([facility] ONLY)   ____________________________________________  EKG    ____________________________________________  RADIOLOGY  Ct Head Wo Contrast  09/05/2015  CLINICAL DATA:  Residual numbness of the left fingers and hand. Started having numbness of both legs. EXAM: CT HEAD WITHOUT CONTRAST TECHNIQUE: Contiguous axial images were obtained from the base of the skull through the vertex without intravenous contrast. COMPARISON:  06/21/2015 FINDINGS: There is no evidence of mass effect, midline shift or extra-axial fluid collections. There is no evidence of a space-occupying lesion or intracranial hemorrhage. There is no evidence of a cortical-based area of acute infarction. The ventricles and sulci are appropriate for the patient's age. The basal cisterns are patent. Visualized portions of the orbits are unremarkable. The visualized portions of the paranasal sinuses and mastoid air cells are unremarkable. The osseous structures are unremarkable. IMPRESSION: No acute intracranial pathology. Electronically Signed   By: Kathreen Devoid   On: 09/05/2015 16:57    ____________________________________________   PROCEDURES  Procedure(s) performed: None  Critical Care performed: No ____________________________________________   INITIAL IMPRESSION / ASSESSMENT AND PLAN / ED COURSE  Pertinent labs & imaging results that were available during my care of the patient were reviewed by me and considered in my medical decision making (see chart for details).  50 y.o. female with a history of CVA status post TPA, HTN, presenting with bilateral lower extremity numbness. On my exam, the patient has isolated numbness on the anterior thighs bilaterally. Her exam is not consistent with acute CVA, nor with spinal cord compression or cauda equina syndrome. It is atypical but may represent neuropathic pain. The patient has stable vital signs, and normal laboratory studies. I will  get a repeat CT, and discuss her symptoms with neurology to see if they may be able to see her in the clinic sooner. I anticipate discharge as long as the  remainder of her evaluation is reassuring.  ____________________________________________  FINAL CLINICAL IMPRESSION(S) / ED DIAGNOSES  Final diagnoses:  Bilateral leg numbness      NEW MEDICATIONS STARTED DURING THIS VISIT:  Discharge Medication List as of 09/05/2015  6:15 PM       Eula Listen, MD 09/05/15 1933

## 2015-09-05 NOTE — ED Notes (Signed)
Pt to CT

## 2015-09-06 ENCOUNTER — Encounter: Payer: Self-pay | Admitting: Neurology

## 2015-09-06 ENCOUNTER — Ambulatory Visit (INDEPENDENT_AMBULATORY_CARE_PROVIDER_SITE_OTHER): Payer: BLUE CROSS/BLUE SHIELD | Admitting: Neurology

## 2015-09-06 VITALS — BP 135/92 | HR 90 | Ht 62.0 in | Wt 144.0 lb

## 2015-09-06 DIAGNOSIS — I63339 Cerebral infarction due to thrombosis of unspecified posterior cerebral artery: Secondary | ICD-10-CM

## 2015-09-06 DIAGNOSIS — E785 Hyperlipidemia, unspecified: Secondary | ICD-10-CM | POA: Diagnosis not present

## 2015-09-06 DIAGNOSIS — F329 Major depressive disorder, single episode, unspecified: Secondary | ICD-10-CM | POA: Insufficient documentation

## 2015-09-06 DIAGNOSIS — F32A Depression, unspecified: Secondary | ICD-10-CM | POA: Insufficient documentation

## 2015-09-06 DIAGNOSIS — I639 Cerebral infarction, unspecified: Secondary | ICD-10-CM | POA: Diagnosis not present

## 2015-09-06 MED ORDER — CITALOPRAM HYDROBROMIDE 10 MG PO TABS: 10.0000 mg | ORAL_TABLET | Freq: Every day | ORAL | Status: DC

## 2015-09-06 NOTE — Progress Notes (Signed)
STROKE NEUROLOGY FOLLOW UP NOTE  NAME: Tina Sharp DOB: 1966/02/08  REASON FOR VISIT: stroke follow up HISTORY FROM: pt and chart  Today we had the pleasure of seeing Tina Sharp in follow-up at our Neurology Clinic. Pt was accompanied by mom.   History Summary Tina Sharp is a 50 y.o. female with history of GERD and invasive lobular breast carcinoma was admitted on 06/11/15 for acute onset of numbness of the left hemiparesis and headache with photophobia. She received IV TPA at [facility] and was transferred to [facility]. Her symptoms improved. 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 MRA showed irregular right M3 and M4 branches with right fetal PCA. CUS showed right ICA 40-59% stenosis. TTE and TEE unremarkable and loop recorder placed. LDL 65 and A1C 5.4 with negative hypercoagulable work up except homocysteine level 36.2. She was discharged with ASA 325mg .  She was readmitted on 06/21/15 for worsening left sided symptoms. MRI negative for new strokes but CTA head and neck showed right ICA thrombus. She was put on anticoagulation coumadin with lovenox bridge.  Discharged in good condition with outpt PT/OT. 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   Follow up 07/12/15 - the patient has been doing well. No recurrent stroke like symptoms. Continue following with outpt PT/OT and left hand getting better but still difficulty with fine motor activity. Still on coumadin but recently decreased dose due to INR.> 5.0. Loop recorder no afib found so far. Still not quit smoking yet. BP 152/93 today but pt stated that BP at home 120-140s. Currently has PT/OT twice a week.   Interval History During the interval time, pt has been doing well until 2 weeks ago when she started to have bilateral thigh pain and anterior thigh numbness. No swelling, or redness. The pain affected her walking and she has to walk slow and really  antalgic gait. She also complains of 3 episodes of hot flushes at back of neck, can last up to 5 hours and resolve. No foreleg or feet weakness, no UE weakness, no numbness anywhere else, no visual changes or speech problem. Denies b/b dysfunction. She was prescribed with klonapin 0.5mg  bid for anxiety. She went to ER yesterday and head CT no acute abnormalities. As per pt, 3 weeks ago INR 3.1 and one week ago 1.2. Will have another check in one week.  REVIEW OF SYSTEMS: Full 14 system review of systems performed and notable only for those listed below and in HPI above, all others are negative:  Constitutional:   Cardiovascular:  Ear/Nose/Throat:   Skin:  Eyes:   Respiratory:   Gastroitestinal:   Genitourinary:  Hematology/Lymphatic:   Endocrine:  Musculoskeletal:   Allergy/Immunology:   Neurological:  HA, numbness, weakness Psychiatric:  Sleep:   The following represents the patient's updated allergies and side effects list: No Known Allergies  The neurologically relevant items on the patient's problem list were reviewed on today's visit.  Neurologic Examination  A problem focused neurological exam (12 or more points of the single system neurologic examination, vital signs counts as 1 point, cranial nerves count for 8 points) was performed.  Blood pressure 135/92, pulse 90, height 5\' 2"  (1.575 m), weight 144 lb (65.318 kg).  General - Well nourished, well developed, in no apparent distress.  Ophthalmologic - Sharp disc margins OU.   Cardiovascular - Regular rate and rhythm with no murmur.  Mental Status -  Level of arousal and orientation to time, place, and person were intact. Language including expression, naming, repetition, comprehension was assessed and found intact. Fund of Knowledge was assessed and was intact.  Cranial Nerves II - XII - II - Visual field intact OU. III, IV, VI - Extraocular movements intact. V - Facial sensation intact bilaterally. VII - Facial  movement intact bilaterally. VIII - Hearing & vestibular intact bilaterally. X - Palate elevates symmetrically. XI - Chin turning & shoulder shrug intact bilaterally. XII - Tongue protrusion intact.  Motor Strength - The patient's strength was normal in upper extremities except mild left hand decreased grip and dexterity difficulty and pronator drift was absent. 4/5 bilateral LE proximally, but 5/5 distally. Bilateral mild tenderness on anterior thigh palpation. Bulk was normal and fasciculations were absent.   Motor Tone - Muscle tone was assessed at the neck and appendages and was normal.  Reflexes - The patient's reflexes were 2+ in all extremities include patellar and ankle reflex and she had no pathological reflexes.  Sensory - Light touch, temperature/pinprick were assessed and were normal except bilateral anterior thigh decreased touch and pinprick.    Coordination - The patient had normal movements in the hands and feet with no ataxia or dysmetria.  Tremor was absent.  Gait and Station - The patient's transfers, posture, gait, station, and turns were observed as normal.  Data reviewed: I personally reviewed the images and agree with the radiology interpretations.  Dg Chest 2 View 06/21/2015 No active cardiopulmonary disease.   Ct Head Wo Contrast 06/21/2015 1. Expected evolution of scattered right MCA cortical infarcts seen on 06/12/2015 MRI. 2. No new intracranial abnormality identified.   Ct Angio Neck W/cm &/or Wo/cm 06/22/2015 1. Nonocclusive intraluminal thrombus within the proximal right ICA beginning at the right carotid bifurcation as above. Finding is most certainly the embolic source of recently identified right cerebral infarcts. 2. Calcified and noncalcified plaque about the left carotid bifurcation without significant stenosis. 3. Widely patent vertebral arteries.   Mr Brain Wo Contrast 06/21/2015 Improving areas of subacute infarction in the right MCA territory  compared with 06/12/2015. Interval development of mild amount of hemorrhage in the right posterior insular infarct and in the right posterior parietal lobe which appear to have occurred since the prior MRI. No acute ischemic infarction.   Mr Jodene Nam Head/brain Wo Cm 06/12/2015 No proximal flow reducing lesion is observed on MRA, but there is subtle irregularity and beading of the distal RIGHT MCA branches as compared to the LEFT.   2D Echocardiogram  - Left ventricle: The cavity size was normal. Wall thickness wasincreased in a pattern of mild LVH. Systolic function was normal.The estimated ejection fraction was in the range of 60% to 65%.Wall motion was normal; there were no regional wall motionabnormalities. Doppler parameters are consistent with abnormalleft ventricular relaxation (grade 1 diastolic dysfunction).  TEE 06/14/15 - Left ventricle: Systolic function was normal. The estimated ejection fraction was in the range of 55% to 60%. Wall motion was normal; there were no regional wall motion abnormalities. - Left atrium: No evidence of thrombus in the atrial cavity or appendage. - Right atrium: No evidence of thrombus in the atrial cavity or appendage. - Atrial septum: Redundant and mildly mobile but negative bubble study Impressions: - No cardiac source of emboli was indentified.  CUS 06/12/15 - Findings suggest low range 40-59% right internal carotid artery stenosis and 1-39% left internal carotid artery stenosis. Vertebral arteries are patent with antegrade flow.  CT head  09/05/15 - No acute intracranial pathology.   Component     Latest Ref Rng 06/12/2015 06/13/2015 06/22/2015  Cholesterol     0 - 200 mg/dL 129    Triglycerides     <150 mg/dL 93    HDL Cholesterol     >40 mg/dL 45    Total CHOL/HDL Ratio      2.9    VLDL     0 - 40 mg/dL 19    LDL (calc)     0 - 99 mg/dL 65    Alpha galactosidase, serum     28.0 - 80.0 nmol/hr/mg prt   41.8    Interpretation        Comment  Director Review        Comment  Methodology        Comment  PTT Lupus Anticoagulant     0.0 - 40.6 sec  28.7   DRVVT     0.0 - 44.0 sec  33.9   Lupus Anticoag Interp       Comment:   Beta-2 Glyco I IgG     0 - 20 GPI IgG units  <9   Beta-2-Glycoprotein I IgM     0 - 32 GPI IgM units  <9   Beta-2-Glycoprotein I IgA     0 - 25 GPI IgA units  <9   Anticardiolipin IgG     0 - 14 GPL U/mL  <9   Anticardiolipin IgM     0 - 12 MPL U/mL  <9   Anticardiolipin IgA     0 - 11 APL U/mL  <9   Hemoglobin A1C     4.8 - 5.6 % 5.4  5.4  Mean Plasma Glucose      108  108  Recommendations-F5LEID:       Comment   Comment       Comment   Recommendations-PTGENE:       Comment   Additional Information       Comment   Vitamin B-12     180 - 914 pg/mL  211 179 (L)  TSH     0.350 - 4.500 uIU/mL  1.633   ANA Ab, IFA       Negative   Sed Rate     0 - 22 mm/hr  7   Antithrombin Activity     75 - 120 %  109   Protein C Activity     73 - 180 %  118   Protein C, Total     60 - 150 %  87   Protein S Activity     63 - 140 %  139   Protein S Ag, Total     60 - 150 %  143   Homocysteine     0.0 - 15.0 umol/L  36.2 (H)   Folate     >5.9 ng/mL   3.9 (L)     Assessment: As you may recall, she is a 50 y.o. Caucasian female with PMH of GERD and invasive lobular breast carcinoma was admitted on 06/11/15 for scattered right MCA and PCA infarcts. She received tPA. MRA showed irregular right M3 and M4 branches with right fetal PCA. CUS showed right ICA 40-59% stenosis. TTE and TEE unremarkable and loop recorder placed. LDL 65 and A1C 5.4 with negative hypercoagulable work up except homocysteine level 36.2. She was discharged with ASA 325mg . She was readmitted on 06/21/15 for worsening left sided symptoms. MRI negative for  new strokes but CTA head and neck showed right ICA thrombus. She was put on anticoagulation coumadin with lovenox bridge. She was also put on FA, B12  and B6 for hyperhomocysteinemia with low B12 and FA levels. Still not quit smoking yet. Left hand dexterity difficulty improving.   However, for the last 2 weeks, she started to have bilateral anterior thigh pain and numbness, which affected her walking. No neuro changes. She was put on Klonopin 0.5mg  bid. On exam, bilateral anterior thigh mild tenderness and decreased sensation, however, posterior thigh and foreleg or feet. Does not consistent with stroke, or DVT or thigh hematoma or knee injury. Denies any leg trauma. No hyperreflexia or b/b dysfunction for spinal cord injury. However, she does have significant depressed mood and stress from work. I think her condition likely due to thigh muscle overexertion in the setting of significant depression. Will prescribe SSRI. Repeat CTA head and neck.  Plan:  - continue coumadin for stroke prevention. INR goal 2-3 - continue Folic acid, 123456 and B6 for hyperhomocysteinemia treatment - Follow up with your primary care physician for stroke risk factor modification. Recommend maintain blood pressure goal <130/80, diabetes with hemoglobin A1c goal below 6.5% and lipids with LDL cholesterol goal below 70 mg/dL.  - will repeat CTA neck and neck to follow up with right ICA thrombosis - quit smoking - add celexa for depression. Relaxation skills and deal with stress. - follow up with loop recorder monitoring. - continue home exercise - follow up in 2 months.  I spent more than 25 minutes of face to face time with the patient. Greater than 50% of time was spent in counseling and coordination of care.   Orders Placed This Encounter  Procedures  . CT Angio Neck W/Cm &/Or Wo/Cm    Standing Status: Future     Number of Occurrences:      Standing Expiration Date: 11/03/2016    Order Specific Question:  If indicated for the ordered procedure, I authorize the administration of contrast media per Radiology protocol    Answer:  Yes    Order Specific Question:   Reason for Exam (SYMPTOM  OR DIAGNOSIS REQUIRED)    Answer:  right ICA thrombosis follow up    Order Specific Question:  Is the patient pregnant?    Answer:  No    Order Specific Question:  Preferred imaging location?    Answer:  Internal  . CT Angio Head W/Cm &/Or Wo Cm    Standing Status: Future     Number of Occurrences:      Standing Expiration Date: 11/03/2016    Order Specific Question:  If indicated for the ordered procedure, I authorize the administration of contrast media per Radiology protocol    Answer:  Yes    Order Specific Question:  Reason for Exam (SYMPTOM  OR DIAGNOSIS REQUIRED)    Answer:  right ICA thrombosis follow up    Order Specific Question:  Is the patient pregnant?    Answer:  No    Order Specific Question:  Preferred imaging location?    Answer:  Internal    Meds ordered this encounter  Medications  . citalopram (CELEXA) 10 MG tablet    Sig: Take 1 tablet (10 mg total) by mouth daily.    Dispense:  30 tablet    Refill:  5    Patient Instructions  - continue coumadin for stroke prevention - INR goal 2-3 - continue Folic acid, 123456 and B6 for hyperhomocysteinemia  treatment - Follow up with your primary care physician for stroke risk factor modification. Recommend maintain blood pressure goal <130/80, diabetes with hemoglobin A1c goal below 6.5% and lipids with LDL cholesterol goal below 70 mg/dL.  - will repeat CTA neck to look for the clot in carotid artery - quit smoking - add celexa for depression.  - follow up with loop recorder monitoring. - continue home exercise - follow up in 2 months.    Rosalin Hawking, MD PhD [facility] [address], [place], [place] [address] [phone]

## 2015-09-06 NOTE — Telephone Encounter (Signed)
Thanks. Agree with plan Tina Sharp kindly call patient to see what is going on

## 2015-09-06 NOTE — Patient Instructions (Addendum)
-   continue coumadin for stroke prevention - INR goal 2-3 - continue Folic acid, 123456 and B6 for hyperhomocysteinemia treatment - Follow up with your primary care physician for stroke risk factor modification. Recommend maintain blood pressure goal <130/80, diabetes with hemoglobin A1c goal below 6.5% and lipids with LDL cholesterol goal below 70 mg/dL.  - will repeat CTA neck to look for the clot in carotid artery - quit smoking - add celexa for depression.  - follow up with loop recorder monitoring. - continue home exercise - follow up in 2 months.

## 2015-09-07 ENCOUNTER — Telehealth: Payer: Self-pay | Admitting: Neurology

## 2015-09-07 NOTE — Telephone Encounter (Signed)
Called her at 6:20pm to her mobile phone number listed on file. She was not available. Left VM for her to call back regarding her FMLA form.   Rosalin Hawking, MD PhD Stroke Neurology 09/07/2015 6:20 PM

## 2015-09-07 NOTE — Telephone Encounter (Signed)
Patient saw Dr Erlinda Hong on 09/06/2015 for her weakness of legs see office note.

## 2015-09-07 NOTE — Telephone Encounter (Signed)
Rn call patient about disability forms. PT will be retiring on disability. Pt will be forms back tomorrow.Rn explain she needs to fill out three release forms and pay 25.00 fee. Pt verbalized understanding.

## 2015-09-07 NOTE — Telephone Encounter (Signed)
Pt called said her HR from her work and she have decided she will retire with disability. She has 2 sets of forms to be filled out. She said FMLA will need to have dates 09/05/15- 09/23/15. The other set of forms will be state retirement and will need records too. She can't type or walk. Pt said she will drop off forms tomorrow 09/08/15. FYI only

## 2015-09-08 ENCOUNTER — Other Ambulatory Visit (INDEPENDENT_AMBULATORY_CARE_PROVIDER_SITE_OTHER): Payer: Self-pay

## 2015-09-08 ENCOUNTER — Other Ambulatory Visit: Payer: Self-pay | Admitting: Neurology

## 2015-09-08 ENCOUNTER — Encounter: Payer: BLUE CROSS/BLUE SHIELD | Admitting: Occupational Therapy

## 2015-09-08 ENCOUNTER — Ambulatory Visit: Payer: BLUE CROSS/BLUE SHIELD | Admitting: Physical Therapy

## 2015-09-08 DIAGNOSIS — R269 Unspecified abnormalities of gait and mobility: Secondary | ICD-10-CM

## 2015-09-08 DIAGNOSIS — I6980 Unspecified sequelae of other cerebrovascular disease: Secondary | ICD-10-CM | POA: Diagnosis not present

## 2015-09-08 DIAGNOSIS — R201 Hypoesthesia of skin: Secondary | ICD-10-CM

## 2015-09-08 DIAGNOSIS — M791 Myalgia, unspecified site: Secondary | ICD-10-CM

## 2015-09-08 DIAGNOSIS — Z0289 Encounter for other administrative examinations: Secondary | ICD-10-CM

## 2015-09-08 DIAGNOSIS — R531 Weakness: Secondary | ICD-10-CM

## 2015-09-08 NOTE — Telephone Encounter (Signed)
LFt  Message for patient about release form and payment for form for disability form. Patient came into office today and met with Dr.Xu about the disability forms.

## 2015-09-08 NOTE — Therapy (Addendum)
[facility] [address] [place], [place], [address] Phone: [phone]   Fax:  [phone]  Physical Therapy Treatment and Discharge Summary  Patient Details  Name: Tina Sharp MRN: [id number] Date of Birth: 1966/04/07 Referring Provider: Antony Contras, MD  Encounter Date: 09/08/2015      PT End of Session - 09/08/15 1621    Visit Number 2   Number of Visits 9  eval + 8 visits   Date for PT Re-Evaluation 10/02/15   Authorization Type BCBS   PT Start Time 1459  Pt arrived late to session   PT Stop Time 1544   PT Time Calculation (min) 45 min   Activity Tolerance Patient limited by fatigue   Behavior During Therapy [facility] for tasks assessed/performed      Past Medical History  Diagnosis Date  . GERD (gastroesophageal reflux disease)   . Cancer [facility]) June 2015    Invasive lobular carcinoma, 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 ER/ PR+; Her 2 neu not overexpressed, microscopic positive margin (skin).  . Hypertension   . Depression   . Anxiety   . 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 Laparoscopic hysterectomy  2010    [facility]  . Breast surgery Left 04/08/14    mastectomy  . Reduction mammoplasty Right 04/08/14    Dr Tula Nakayama  . Colonoscopy    . Ep implantable device N/A 06/14/2015    Procedure: Loop Recorder Insertion;  Surgeon: Evans Lance, MD;  Location: [facility];  Service: Cardiovascular;  Laterality: N/A;  . Tee without cardioversion N/A 06/14/2015    Procedure: TRANSESOPHAGEAL ECHOCARDIOGRAM (TEE);  Surgeon: Josue Hector, MD;  Location: [facility] ENDOSCOPY;  Service: Cardiovascular;  Laterality: N/A;  . Abdominal hysterectomy  2000    left both ovaries in  . Tonsillectomy and adenoidectomy      pt was 50 years old    There were no vitals filed for this visit.  Visit Diagnosis:  Weakness  generalized  Abnormality of gait  Impaired sensation      Subjective Assessment - 09/08/15 1503    Subjective Pt seen in ED on Monday (1/23) and was not admitted. Dr. Erlinda Hong saw pt the following day and started pt on new medication (SSRI). Pt reports no change in symtpoms since PT evaluation. Pt now feels she is unable to work and is attempting to get retirement from disability. Pt states, "I cannot type for nothing and I can't sit in that chair at work with my legs hanging down." Pt declined offer for this PT to request MD order for OT.   Pertinent History PMH significant for: CVA (06/10/16) with cryptogenic R brain infarcts s/p IV TPA; HLD; breast cancer s/p L mastectomy   Limitations Walking   Patient Stated Goals "To make me walk better."   Currently in Pain? No/denies            [facility] PT Assessment - 09/08/15 0001    Strength   Right Hip Flexion 3-/5   Right Hip Extension 2+/5   Left Hip Flexion 3+/5   Left Hip Extension 2+/5   Right Knee Flexion 4-/5   Right Knee Extension 4/5   Left Knee Flexion 3+/5   Left Knee Extension 4+/5   Right Ankle Dorsiflexion 4+/5   Right Ankle Plantar Flexion 4/5   Left Ankle Dorsiflexion 4+/5  Left Ankle Plantar Flexion 4+/5                     [facility] Adult PT Treatment/Exercise - 09/08/15 0001    Ambulation/Gait   Ambulation/Gait Yes   Ambulation/Gait Assistance 5: Supervision   Ambulation Distance (Feet) 180 Feet   Assistive device None   Gait Pattern Step-through pattern;Decreased stride length;Decreased hip/knee flexion - right;Decreased hip/knee flexion - left;Right foot flat;Left foot flat   Ambulation Surface Level;Indoor   Stairs Yes   Stairs Assistance 4: Min guard;5: Supervision   Stairs Assistance Details (indicate cue type and reason) Initially performed with reciprocal pattern with RUE support at R rail requiring min guard; with cueing for step-to pattern, BUE support at R rail, lateral negotiation technique, and  abdominal bracing, pt required only supervision and reported increased ease of stair negotiation.    Stair Management Technique Alternating pattern;Step to pattern;Sideways;Forwards   Number of Stairs 8   Height of Stairs 6   Balance   Balance Assessed Yes   Static Standing Balance   Rhomberg - Eyes Opened --  > 30 seconds without LOB   Rhomberg - Eyes Closed --  > 30 seconds with no LOB, minimal postural sway   Exercises   Exercises Other Exercises   Other Exercises  With verbal, demo, and tactile cueing from this PT, pt effectively performed home exercises, including bridging, B clamshells, and transverse abdominus activation with LE marching. See Pt Instructions for details on all exercises, reps (to pt fatigue), and sets.                  PT Education - 09/08/15 1527    Education provided Yes   Education Details Initiated HEP and walking program; see Pt Instructions.   Person(s) Educated Patient   Methods Explanation;Demonstration;Tactile cues;Verbal cues;Handout   Comprehension Verbalized understanding;Returned demonstration          PT Short Term Goals - 09/03/15 1405    PT SHORT TERM GOAL #1   Title STG's = LTG's           PT Long Term Goals - 09/03/15 1406    PT LONG TERM GOAL #1   Title Pt will independently demo initial HEP to indicate daily compliance, maximize functional gains made in PT. Target date: 09/30/15   Time --   Period --   PT LONG TERM GOAL #2   Title Pt will improve 5 Times Sit to Stand Test from 37.07 seconds to < / = 22 seconds to indicate improved functional LE strength, progress toward decreased fall risk. Target date: 09/30/15    Time --   Period --   PT LONG TERM GOAL #3   Title Pt will increase 6MWT distance from 19' to 902' to indicate significant impovement in functional endurance. Target date: 09/30/15   Time --   Period --   PT LONG TERM GOAL #4   Title Pt will improve gait velocity from 2.02 ft/sec to > 2.62 ft/sec to  indicate functional status of community ambulator. Target date: 09/30/15               Plan - 09/08/15 1622    Clinical Impression Statement Skilled session focused on initiating HEP and walking program. Pt tolerated all interventions well but did require frequent rest breaks due to increased fatigue.    Pt will benefit from skilled therapeutic intervention in order to improve on the following deficits Abnormal gait;Decreased activity tolerance;Decreased endurance;Impaired sensation;Decreased strength  Rehab Potential Good   Clinical Impairments Affecting Rehab Potential works FT; increased distance/driving time to PT clinic from both home and work   PT Frequency 2x / week   PT Duration 4 weeks   PT Treatment/Interventions ADLs/Self Care Home Management;Gait training;Stair training;Functional mobility training;Therapeutic activities;Therapeutic exercise;Patient/family education;Neuromuscular re-education   PT Next Visit Plan Check for HEP performance/compliance. Ask about walking program. Continue strengthening core and LE's (emphasis on proximal).   Consulted and Agree with Plan of Care Patient        Problem List Patient Active Problem List   Diagnosis Date Noted  . Myalgia 09/08/2015  . Depression 09/06/2015  . Cerebrovascular accident (CVA) due to thrombosis of posterior cerebral artery ([place]) 08/18/2015  . 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 Stroke with cerebral ischemia ([place]) 07/12/2015  . HLD (hyperlipidemia) 07/12/2015  . CVA (cerebral infarction) 06/21/2015  . TIA (transient ischemic attack) 06/21/2015  . Macrocytic anemia 06/20/2015  . Headache 06/14/2015  . Cigarette smoker 06/14/2015  . Stroke (cerebrum) ([place]) cryptogenic R brain infarcts s/p IV tPA  06/11/2015  . Malignant neoplasm of breast (female), unspecified site 04/23/2014  . Breast cancer ([place]) 02/20/2014   Billie Ruddy, PT, [facility] [address] [place], [place], [address] Phone: [phone]   Fax:  [phone] 09/08/2015, 4:26 PM   Name: MARZETTA LANZA MRN: [id number] Date of Birth: 1966/05/16   Addendum: PHYSICAL THERAPY DISCHARGE SUMMARY  Visits from Start of Care: 3  Current functional level related to goals / functional outcomes: Unknown, as patient did not return to PT after initial 3 sessions.    Remaining deficits: Unknown, as patient did not return to PT after initial 3 sessions.    Education / Equipment: See above.  Plan: Patient agrees to discharge.  Patient goals were not met. Patient is being discharged due to not returning since the last visit.  ?????         Billie Ruddy, PT, DPT [facility] [address] [place], [place], [address] Phone: [phone]   Fax:  [phone] 06/28/16, 8:53 AM

## 2015-09-08 NOTE — Patient Instructions (Addendum)
Abduction: Clam (Eccentric) - Side-Lying    Lie on side with knees bent. Lift top knee, keeping feet together. Keep trunk steady. Slowly lower for 3-5 seconds. Perform 12 reps with the RIGHT leg; 15 reps on the LEFT leg. Perform 2-3 sets per day on each leg.   Bridging    Slowly raise buttocks from floor, keeping stomach tight. Repeat __13__ times per set. Do _2-3___ sets per day.   Bracing With Leg March (Hook-Lying)    While lying on your back with both knees bent, sink your belly button towards your backbone (as though you're putting on a tight pair of pants). While holding this abdominal contraction, march with alternating legs, lifting foot about 6 inches from floor/bed. Repeat __4_  cnsecutive times. Work up to 8 consecutive marches while bracing. Do __10_ times a day.  Walking Program:  Begin walking for exercise for 6 minutes, 1-2 times/day, 5 days/week.   Progress your walking program by adding 1 minute to your routine each week, as tolerated. Be sure to wear good walking shoes, walk in a safe environment and only progress to your tolerance.

## 2015-09-08 NOTE — Telephone Encounter (Signed)
Patient did sign release form and did paid. Receipt and release form receive from medical records.

## 2015-09-09 ENCOUNTER — Telehealth: Payer: Self-pay | Admitting: Neurology

## 2015-09-09 DIAGNOSIS — Z0289 Encounter for other administrative examinations: Secondary | ICD-10-CM

## 2015-09-09 LAB — COMPREHENSIVE METABOLIC PANEL
A/G RATIO: 1.2 (ref 1.1–2.5)
ALT: 42 IU/L — AB (ref 0–32)
AST: 57 IU/L — AB (ref 0–40)
Albumin: 3.4 g/dL — ABNORMAL LOW (ref 3.5–5.5)
Alkaline Phosphatase: 120 IU/L — ABNORMAL HIGH (ref 39–117)
BILIRUBIN TOTAL: 0.3 mg/dL (ref 0.0–1.2)
BUN/Creatinine Ratio: 7 — ABNORMAL LOW (ref 9–23)
BUN: 4 mg/dL — AB (ref 6–24)
CALCIUM: 9 mg/dL (ref 8.7–10.2)
CHLORIDE: 102 mmol/L (ref 96–106)
CO2: 25 mmol/L (ref 18–29)
Creatinine, Ser: 0.61 mg/dL (ref 0.57–1.00)
GFR calc Af Amer: 121 mL/min/{1.73_m2} (ref >59)
GFR, EST NON AFRICAN AMERICAN: 105 mL/min/{1.73_m2} (ref >59)
GLUCOSE: 93 mg/dL (ref 65–99)
Globulin, Total: 2.8 g/dL (ref 1.5–4.5)
POTASSIUM: 4.9 mmol/L (ref 3.5–5.2)
Sodium: 143 mmol/L (ref 134–144)
Total Protein: 6.2 g/dL (ref 6.0–8.5)

## 2015-09-09 LAB — SYSTEMIC LUPUS PROFILE A
Chromatin Ab SerPl-aCnc: <0.2 AI (ref 0.0–0.9)
ENA RNP Ab: <0.2 AI (ref 0.0–0.9)
ENA SM Ab Ser-aCnc: <0.2 AI (ref 0.0–0.9)
ENA SSA (RO) Ab: <0.2 AI (ref 0.0–0.9)

## 2015-09-09 LAB — CBC
Hematocrit: 47.1 % — ABNORMAL HIGH (ref 34.0–46.6)
Hemoglobin: 15.8 g/dL (ref 11.1–15.9)
MCH: 32.4 pg (ref 26.6–33.0)
MCHC: 33.5 g/dL (ref 31.5–35.7)
MCV: 97 fL (ref 79–97)
PLATELETS: 345 10*3/uL (ref 150–379)
RBC: 4.87 x10E6/uL (ref 3.77–5.28)
RDW: 19.2 % — AB (ref 12.3–15.4)
WBC: 6.4 10*3/uL (ref 3.4–10.8)

## 2015-09-09 LAB — C-REACTIVE PROTEIN: CRP: 3.9 mg/L (ref 0.0–4.9)

## 2015-09-09 LAB — SEDIMENTATION RATE: SED RATE: 11 mm/h (ref 0–40)

## 2015-09-09 NOTE — Telephone Encounter (Signed)
Pt stated she presented on 1/26 for questions regarding FMLA papers and ended up spending 40 minutes with Dr. Erlinda Hong.  She stated she saw Dr. Leonie Man in the hospital and has a f/u with him on 2/14, however, Dr. Erlinda Hong ordered labs and a NCV/EMG.  The next available NCV/EMG will be after the scheduled f/u on 2/14 so I want to know if the pt should be following up with [facility] or if it should actually be [facility].

## 2015-09-09 NOTE — Telephone Encounter (Signed)
Patient should follow up with Dr.Sethi on 09/27/2015. Dr Erlinda Hong just saw patient early for fmla forms.

## 2015-09-12 ENCOUNTER — Ambulatory Visit (INDEPENDENT_AMBULATORY_CARE_PROVIDER_SITE_OTHER): Payer: BLUE CROSS/BLUE SHIELD | Admitting: *Deleted

## 2015-09-12 DIAGNOSIS — I639 Cerebral infarction, unspecified: Secondary | ICD-10-CM | POA: Diagnosis not present

## 2015-09-12 NOTE — Progress Notes (Signed)
Carelink Summary Report / Loop Recorder 

## 2015-09-12 NOTE — Telephone Encounter (Signed)
Patient called to check status of FMLA forms, states forms have to be turned in today or she won't get paid. Advised patient, per nurse Katrina that forms will be faxed today. Patient requests forms to be faxed to [phone].

## 2015-09-12 NOTE — Telephone Encounter (Signed)
FMLA form and  Short term disability form fax to [place] at [phone] and FMLa form fax to [place] at [facility] at [phone]. FAX was confirm and receive.

## 2015-09-13 ENCOUNTER — Encounter: Payer: BLUE CROSS/BLUE SHIELD | Admitting: Neurology

## 2015-09-13 ENCOUNTER — Inpatient Hospital Stay: Admission: RE | Admit: 2015-09-13 | Payer: BLUE CROSS/BLUE SHIELD | Source: Ambulatory Visit

## 2015-09-13 ENCOUNTER — Other Ambulatory Visit: Payer: BLUE CROSS/BLUE SHIELD

## 2015-09-13 ENCOUNTER — Ambulatory Visit: Payer: BLUE CROSS/BLUE SHIELD | Admitting: Neurology

## 2015-09-14 ENCOUNTER — Ambulatory Visit: Payer: BLUE CROSS/BLUE SHIELD | Admitting: Physical Therapy

## 2015-09-14 ENCOUNTER — Telehealth: Payer: Self-pay | Admitting: Neurology

## 2015-09-14 NOTE — Telephone Encounter (Signed)
Called the pt to let her know that I did some research about anastrozole and it seems stroke and myalgia are listed as side effects of anastrozole. Stroke happens < 1% (0.88%) and myalgia happens about 2-6%. I would like her to discuss with her oncologist to see if there is any association of her b/l thigh pain and stroke with right ICA thrombus to her anastrozole use. She stated that she will discuss with her oncologist and will let us know.  Rosalin Hawking, MD PhD Stroke Neurology 09/14/2015 4:06 PM

## 2015-09-15 ENCOUNTER — Encounter: Payer: Self-pay | Admitting: Internal Medicine

## 2015-09-16 ENCOUNTER — Ambulatory Visit
Admission: RE | Admit: 2015-09-16 | Discharge: 2015-09-16 | Disposition: A | Discharge status: Home / Self Care | Payer: BLUE CROSS/BLUE SHIELD | Source: Ambulatory Visit | Attending: Neurology | Admitting: Neurology

## 2015-09-16 ENCOUNTER — Telehealth: Payer: Self-pay | Admitting: Neurology

## 2015-09-16 DIAGNOSIS — I63339 Cerebral infarction due to thrombosis of unspecified posterior cerebral artery: Secondary | ICD-10-CM

## 2015-09-16 MED ORDER — IOPAMIDOL (ISOVUE-370) INJECTION 76%
100.0000 mL | Freq: Once | INTRAVENOUS | Status: AC | PRN
  Administered 2015-09-16: 100 mL via INTRAVENOUS

## 2015-09-16 NOTE — Telephone Encounter (Signed)
Tried to call her to deliver CTA result to her but she was not available. Did not leave VM. Please let her know that her CT angio showed much improvement from last CT angiogram. 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 So she needs to continue coumadin therapy and INR goal 2-3. Let her close follow up with coumadin clinic to get INR at goal. Thank you, Katrina.  Rosalin Hawking, MD PhD Stroke Neurology 09/16/2015 6:00 PM

## 2015-09-17 LAB — CUP PACEART REMOTE DEVICE CHECK: MDC IDC SESS DTM: 20161231150529

## 2015-09-19 NOTE — Telephone Encounter (Addendum)
            Rosalin Hawking, MD at 09/16/2015 5:58 PM     Status: Signed       Expand All Collapse All   Tried to call her to deliver CTA result to her but she was not available. Did not leave VM. Please let her know that her CT angio showed much improvement from last CT angiogram. 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 So she needs to continue coumadin therapy and INR goal 2-3. Let her close follow up with coumadin clinic to get INR at goal. Thank you, Katrina.  Rosalin Hawking, MD PhD Stroke Neurology 09/16/2015 6:00 PM        Rn gave patient her CTA results above per Dr Erlinda Hong. Pt verbalized understanding. She was happy the clot was resolving. Rn explain her coumadin levels need to be between 2 and 3.

## 2015-09-19 NOTE — Telephone Encounter (Signed)
Pt returned Katrina's call. She can be reached at [phone]

## 2015-09-19 NOTE — Telephone Encounter (Signed)
Rosalin Hawking, MD at 09/16/2015 5:58 PM     Status: Signed       Expand All Collapse All  Tried to call her to deliver CTA result to her but she was not available. Did not leave VM. Please let her know that her CT angio showed much improvement from last CT angiogram. 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 So she needs to continue coumadin therapy and INR goal 2-3. Let her close follow up with coumadin clinic to get INR at goal. Thank you, Katrina.  Rosalin Hawking, MD PhD Stroke Neurology 09/16/2015 6:00 PM        Rn gave patient her CTA results above per Dr Erlinda Hong. Pt verbalized understanding. She was happy the clot was resolving. Rn explain her coumadin levels need to be between 2 and 3.         Revision History

## 2015-09-19 NOTE — Telephone Encounter (Signed)
Rn call patient to give her the results of the CTA.

## 2015-09-20 ENCOUNTER — Other Ambulatory Visit: Payer: Self-pay

## 2015-09-20 NOTE — Patient Outreach (Signed)
Patient returned call. Verified identity and allowed me to complete mRS questions. mRS = 1

## 2015-09-21 ENCOUNTER — Ambulatory Visit: Payer: Managed Care, Other (non HMO) | Admitting: Physical Therapy

## 2015-09-21 ENCOUNTER — Encounter: Payer: BLUE CROSS/BLUE SHIELD | Admitting: Neurology

## 2015-09-23 NOTE — Telephone Encounter (Signed)
Rn call patient back about her disability being denied. Pt stated Dr. Erlinda Hong put her disability as temporary, and that's why she was denied. Rn stated Dr. Erlinda Hong is seeing patients today and will have to call her back.

## 2015-09-23 NOTE — Telephone Encounter (Signed)
Pt called crying. She said the state retirement disability has been denied due to her disability being temporary and she has lost her job. She said she has been there for 21 yrs and was pd very good and now she has nothing. She has to look for another job and she said she can't do that because she can't walk right now, she can't type because feet, legs and hands are numb. Pt is very distraught. She said she is not mad at [facility] and don't know if there is anything he can do to help her. She can not appeal this either.

## 2015-09-23 NOTE — Telephone Encounter (Signed)
Talk with the patient over the phone. I cannot label her neurological deficits at this moment as permanent since she just had a stroke 3 months ago, she still in the recovery phase of her stroke. Her left hand function still have potential to improve. Patient expressed understanding. She had appointment with Dr.Sethi on 09/27/15 for evaluation, and I encouraged her to seek for opinion from Dr. Leonie Man also. Dr. Leonie Man can fill out her disability form again if needed and she can fille the application again.   I talked to her several days ago about her muscle pain at anterior thigh bilaterally. Anastrozole has side effect of myalgia for 3-6% and also stroke < 1%. I am not sure about the weather there is any association between anastrozole with her myalgia and stroke. I asked her to discuss with her oncologist regarding this. She has not talked to her oncologist yet, but she said she really stopped the medication. I told her that she should not stop the medication before discussing with her oncologist. She said she will contact her oncologist soon.  I have ordered EMG/NCS for her myalgia, which has been scheduled at 10/05/2015. She is reluctant for the test in fear of painful procedure. I encouraged her to proceed with the procedure, but I also asked her to seek for second opinion from Dr. Leonie Man on 09/27/15 regarding EMG/NCS. She expressed understanding and appreciation.  Rosalin Hawking, MD PhD Stroke Neurology 09/23/2015 1:22 PM

## 2015-09-27 ENCOUNTER — Ambulatory Visit (INDEPENDENT_AMBULATORY_CARE_PROVIDER_SITE_OTHER): Payer: Managed Care, Other (non HMO) | Admitting: Neurology

## 2015-09-27 ENCOUNTER — Encounter: Payer: Self-pay | Admitting: Neurology

## 2015-09-27 VITALS — BP 163/112 | HR 67 | Ht 62.0 in | Wt 137.8 lb

## 2015-09-27 DIAGNOSIS — I6521 Occlusion and stenosis of right carotid artery: Secondary | ICD-10-CM

## 2015-09-27 NOTE — Progress Notes (Signed)
STROKE NEUROLOGY FOLLOW UP NOTE  NAME: Tina Sharp DOB: [birth year]  REASON FOR VISIT: stroke follow up HISTORY FROM: pt and chart  Today we had the pleasure of seeing Tina Sharp in follow-up at our Neurology Clinic. Pt was accompanied by mom.   History Summary Tina Sharp is a 50 y.o. female with history of GERD and invasive lobular breast carcinoma was admitted on 06/11/15 for acute onset of numbness of the left hemiparesis and headache with photophobia. She received IV TPA at [facility] and was transferred to [facility]. Her symptoms improved. 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 MRA showed irregular right M3 and M4 branches with right fetal PCA. CUS showed right ICA 40-59% stenosis. TTE and TEE unremarkable and loop recorder placed. LDL 65 and A1C 5.4 with negative hypercoagulable work up except homocysteine level 36.2. She was discharged with ASA 325mg .  She was readmitted on 06/21/15 for worsening left sided symptoms. MRI negative for new strokes but CTA head and neck showed right ICA thrombus. She was put on anticoagulation coumadin with lovenox bridge.  Discharged in good condition with outpt PT/OT. 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   Follow up 07/12/15 - the patient has been doing well. No recurrent stroke like symptoms. Continue following with outpt PT/OT and left hand getting better but still difficulty with fine motor activity. Still on coumadin but recently decreased dose due to INR.> 5.0. Loop recorder no afib found so far. Still not quit smoking yet. BP 152/93 today but pt stated that BP at home 120-140s. Currently has PT/OT twice a week.   Interval History 09/27/15 :   She is seen today for follow-up and she called complaining of tingling numbness in her fingertips as well as tiredness. She had a follow-up CT angiogram done on 09/15/14 which I have personally reviewed and shows  significant resolution of the right carotid clot with only small remnants remaining with excellent flow. Patient states her blood pressure usually well controlled but she has had some hot flashes today and hence it is elevated in office at 163/112. She is doing outpatient physical occupational therapy and feels her walking has improved. She is being treated for depression by primary care physician and feels her medications working and she is doing much better. She is scheduled to undergo electrical study possible no conduction Week. She has been denied disability and I advised her that from the stroke standpoint she clearly does not have enough physical deficits to justify. REVIEW OF SYSTEMS: Full 14 system review of systems performed and notable only for those listed below and in HPI above, all others are negative:   Tingling numbness tightness, depression and all other systems negative The following represents the patient's updated allergies and side effects list: No Known Allergies  The neurologically relevant items on the patient's problem list were reviewed on today's visit.  Neurologic Examination  A problem focused neurological exam (12 or more points of the single system neurologic examination, vital signs counts as 1 point, cranial nerves count for 8 points) was performed.  Blood pressure 163/112, pulse 67, height 5\' 2"  (1.575 m), weight 137 lb 12.8 oz (62.506 kg).  General - Well nourished, well developed, in no apparent distress.  Ophthalmologic - Sharp disc margins OU.   Cardiovascular - Regular rate and rhythm with no murmur.  Mental Status -  Level of arousal and orientation to  time, place, and person were intact. Language including expression, naming, repetition, comprehension was assessed and found intact. Fund of Knowledge was assessed and was intact.  Cranial Nerves II - XII - II - Visual field intact OU. III, IV, VI - Extraocular movements intact. V - Facial sensation  intact bilaterally. VII - Facial movement intact bilaterally. VIII - Hearing & vestibular intact bilaterally. X - Palate elevates symmetrically. XI - Chin turning & shoulder shrug intact bilaterally. XII - Tongue protrusion intact.  Motor Strength - The patient's strength was normal in upper extremities except mild left hand decreased grip and dexterity difficulty and pronator drift was absent. 4/5 bilateral LE proximally, but 5/5 distally. Bilateral mild tenderness on anterior thigh palpation. Bulk was normal and fasciculations were absent.   Motor Tone - Muscle tone was assessed at the neck and appendages and was normal.  Reflexes - The patient's reflexes were 2+ in all extremities include patellar and ankle reflex and she had no pathological reflexes.  Sensory - Light touch, temperature/pinprick were assessed and were normal except bilateral anterior thigh decreased touch and pinprick.    Coordination - The patient had normal movements in the hands and feet with no ataxia or dysmetria.  Tremor was absent.  Gait and Station - The patient's transfers, posture, gait, station, and turns were observed as normal.  Data reviewed: I personally reviewed the images and agree with the radiology interpretations.  Dg Chest 2 View 06/21/2015 No active cardiopulmonary disease.   Ct Head Wo Contrast 06/21/2015 1. Expected evolution of scattered right MCA cortical infarcts seen on 06/12/2015 MRI. 2. No new intracranial abnormality identified.   Ct Angio Neck W/cm &/or Wo/cm 06/22/2015 1. Nonocclusive intraluminal thrombus within the proximal right ICA beginning at the right carotid bifurcation as above. Finding is most certainly the embolic source of recently identified right cerebral infarcts. 2. Calcified and noncalcified plaque about the left carotid bifurcation without significant stenosis. 3. Widely patent vertebral arteries.   Mr Brain Wo Contrast 06/21/2015 Improving areas of subacute  infarction in the right MCA territory compared with 06/12/2015. Interval development of mild amount of hemorrhage in the right posterior insular infarct and in the right posterior parietal lobe which appear to have occurred since the prior MRI. No acute ischemic infarction.   Mr Jodene Nam Head/brain Wo Cm 06/12/2015 No proximal flow reducing lesion is observed on MRA, but there is subtle irregularity and beading of the distal RIGHT MCA branches as compared to the LEFT.   2D Echocardiogram  - Left ventricle: The cavity size was normal. Wall thickness wasincreased in a pattern of mild LVH. Systolic function was normal.The estimated ejection fraction was in the range of 60% to 65%.Wall motion was normal; there were no regional wall motionabnormalities. Doppler parameters are consistent with abnormalleft ventricular relaxation (grade 1 diastolic dysfunction).  TEE 06/14/15 - Left ventricle: Systolic function was normal. The estimated ejection fraction was in the range of 55% to 60%. Wall motion was normal; there were no regional wall motion abnormalities. - Left atrium: No evidence of thrombus in the atrial cavity or appendage. - Right atrium: No evidence of thrombus in the atrial cavity or appendage. - Atrial septum: Redundant and mildly mobile but negative bubble study Impressions: - No cardiac source of emboli was indentified.  CUS 06/12/15 - Findings suggest low range 40-59% right internal carotid artery stenosis and 1-39% left internal carotid artery stenosis. Vertebral arteries are patent with antegrade flow.  CT head 09/05/15 - No acute intracranial pathology.  Component     Latest Ref Rng 06/12/2015 06/13/2015 06/22/2015  Cholesterol     0 - 200 mg/dL 129    Triglycerides     <150 mg/dL 93    HDL Cholesterol     >40 mg/dL 45    Total CHOL/HDL Ratio      2.9    VLDL     0 - 40 mg/dL 19    LDL (calc)     0 - 99 mg/dL 65    Alpha galactosidase, serum     28.0 -  80.0 nmol/hr/mg prt   41.8  Interpretation        Comment  Director Review        Comment  Methodology        Comment  PTT Lupus Anticoagulant     0.0 - 40.6 sec  28.7   DRVVT     0.0 - 44.0 sec  33.9   Lupus Anticoag Interp       Comment:   Beta-2 Glyco I IgG     0 - 20 GPI IgG units  <9   Beta-2-Glycoprotein I IgM     0 - 32 GPI IgM units  <9   Beta-2-Glycoprotein I IgA     0 - 25 GPI IgA units  <9   Anticardiolipin IgG     0 - 14 GPL U/mL  <9   Anticardiolipin IgM     0 - 12 MPL U/mL  <9   Anticardiolipin IgA     0 - 11 APL U/mL  <9   Hemoglobin A1C     4.8 - 5.6 % 5.4  5.4  Mean Plasma Glucose      108  108  Recommendations-F5LEID:       Comment   Comment       Comment   Recommendations-PTGENE:       Comment   Additional Information       Comment   Vitamin B-12     180 - 914 pg/mL  211 179 (L)  TSH     0.350 - 4.500 uIU/mL  1.633   ANA Ab, IFA       Negative   Sed Rate     0 - 22 mm/hr  7   Antithrombin Activity     75 - 120 %  109   Protein C Activity     73 - 180 %  118   Protein C, Total     60 - 150 %  87   Protein S Activity     63 - 140 %  139   Protein S Ag, Total     60 - 150 %  143   Homocysteine     0.0 - 15.0 umol/L  36.2 (H)   Folate     >5.9 ng/mL   3.9 (L)     Assessment: As you may recall, she is a 50 y.o. Caucasian female with PMH of GERD and invasive lobular breast carcinoma was admitted on 06/11/15 for scattered right MCA and PCA infarcts. She received tPA. MRA showed irregular right M3 and M4 branches with right fetal PCA. CUS showed right ICA 40-59% stenosis. TTE and TEE unremarkable and loop recorder placed. LDL 65 and A1C 5.4 with negative hypercoagulable work up except homocysteine level 36.2. She was discharged with ASA 325mg . She was readmitted on 06/21/15 for worsening left sided symptoms. MRI negative for new strokes but CTA head and neck showed right  ICA thrombus. She was put on anticoagulation coumadin with lovenox bridge.  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 Still not quit smoking yet. Left hand dexterity difficulty improving.  New complaints of bilateral fingertip paresthesias likely from carpal tunnel.   Plan:  -I had a long discussion with the patient and her daughter regarding her remote stroke and personally reviewed CT angiogram films from last week and discuss findings with them. I recommend she stay on warfarin for 3 more months and then change over to aspirin for secondary stroke prevention she was also advised to maintain strict control of hypertension and keep blood pressure goal below 130/90. And she would need yearly follow-up of her carotid stenosis. Continue ongoing physical outpatient therapy. Continue treatment for depression as per her primary physician. I also advised patient to avoid activities with rapid repetitive wrist flexion and to wear wrist extension splints for her fingertip paresthesias which may be related to carpal tunnel. She was advised to keep her upcoming appointment for no conduction study. She will return for follow-up in 3 months with Ward Givens, nurse practitioner or call earlier if necessary.  I spent more than 25 minutes of face to face time with the patient. Greater than 50% of time was spent in counseling and coordination of care.       Antony Contras, MD       [facility] [address], [place], [place] [address] [phone]

## 2015-09-27 NOTE — Patient Instructions (Signed)
I had a long discussion with the patient and her daughter regarding her remote stroke and personally reviewed CT angiogram films from last week and discuss findings with them. I recommend she stay on warfarin for 3 more months and then change over to aspirin for secondary stroke prevention she was also advised to maintain strict control of hypertension and keep blood pressure goal below 130/90. And she would need yearly follow-up of her carotid stenosis. Continue ongoing physical outpatient therapy. Continue treatment for depression as per her primary physician. I also advised patient to avoid activities with rapid repetitive wrist flexion and to wear wrist extension splints for her fingertip paresthesias which may be related to carpal tunnel. She was advised to keep her upcoming appointment for no conduction study. She will return for follow-up in 3 months with Ward Givens, nurse practitioner or call earlier if necessary.

## 2015-09-30 ENCOUNTER — Ambulatory Visit: Payer: Managed Care, Other (non HMO) | Admitting: Physical Therapy

## 2015-10-05 ENCOUNTER — Encounter: Payer: BLUE CROSS/BLUE SHIELD | Admitting: Diagnostic Neuroimaging

## 2015-10-05 ENCOUNTER — Ambulatory Visit: Payer: BLUE CROSS/BLUE SHIELD | Admitting: Physical Therapy

## 2015-10-05 ENCOUNTER — Telehealth: Payer: Self-pay

## 2015-10-05 NOTE — Telephone Encounter (Signed)
Rn call [facility] about patients Celexa. Rn stated the medication has 5 refills. Pharmacy stated the request was only sent to see if patient can have 90 day refill. Rn stated the MD who prescribed it is out of town, and will be contacted on Monday. Pharmacy understands. Pt has 5 refills left on 30 days.Marland Kitchen

## 2015-10-05 NOTE — Progress Notes (Signed)
Patient had nerve conduction study test schedule on 10/05/2015. Pt cancel the appointment because she was afraid it may cause pain. Pt was advised by Dr. Erlinda Hong and Dr.Sethi to keep the appointment because she complain of weakness in legs. Message will be sent to Dr.Xu and Dr. Leonie Man

## 2015-10-10 ENCOUNTER — Other Ambulatory Visit: Payer: Self-pay

## 2015-10-10 ENCOUNTER — Telehealth: Payer: Self-pay | Admitting: Neurology

## 2015-10-10 DIAGNOSIS — F329 Major depressive disorder, single episode, unspecified: Secondary | ICD-10-CM

## 2015-10-10 DIAGNOSIS — F32A Depression, unspecified: Secondary | ICD-10-CM

## 2015-10-10 MED ORDER — CITALOPRAM HYDROBROMIDE 10 MG PO TABS: 10.0000 mg | ORAL_TABLET | Freq: Every day | ORAL | Status: DC

## 2015-10-10 NOTE — Telephone Encounter (Signed)
Rn call [facility] and was on hold for Tr[facility]. Rn call last week to state Dr.Xu was out of office,and a call will be made Monday. Rn was on hold but phone disconnected. Will call back.

## 2015-10-10 NOTE — Telephone Encounter (Signed)
Tina Sharp with [facility] [address] [employer] is calling and states that the [employer] prefers that the Rx citalopram 10 mg to be filled in 2 90 prescription instead of 6 monthly prescriptions. Is this OK? Please call.

## 2015-10-10 NOTE — Progress Notes (Signed)
I called and left a message on the patient's answering machine to call me back to discuss her concerns about the nerve conduction test

## 2015-10-10 NOTE — Telephone Encounter (Signed)
Rn call pharmacy back,and was on hold for at least 8 minutes to discuss 90 days. Rn call last week and told them unable to do 90 days because Dr. Erlinda Hong is out of the office.Will do 90 days, Dr. Erlinda Hong gave order for 90 days.

## 2015-10-11 ENCOUNTER — Telehealth: Payer: Self-pay | Admitting: Neurology

## 2015-10-11 ENCOUNTER — Other Ambulatory Visit: Payer: Self-pay | Admitting: Neurology

## 2015-10-11 DIAGNOSIS — R269 Unspecified abnormalities of gait and mobility: Secondary | ICD-10-CM

## 2015-10-11 NOTE — Telephone Encounter (Signed)
Pt returned Dr Leonie Man call regarding pt's concerns about NCV

## 2015-10-11 NOTE — Telephone Encounter (Signed)
I spoke to the patient and answered her  concerns she is now willing to do the nerve conduction study. She requested referral for physical therapy for her legs to be done in [place]

## 2015-10-12 ENCOUNTER — Ambulatory Visit (INDEPENDENT_AMBULATORY_CARE_PROVIDER_SITE_OTHER): Payer: Managed Care, Other (non HMO) | Admitting: *Deleted

## 2015-10-12 ENCOUNTER — Ambulatory Visit: Payer: Managed Care, Other (non HMO) | Attending: Nurse Practitioner | Admitting: Physical Therapy

## 2015-10-12 DIAGNOSIS — I639 Cerebral infarction, unspecified: Secondary | ICD-10-CM

## 2015-10-12 NOTE — Telephone Encounter (Signed)
Pt has call and is schedule for nerve conduction study test.

## 2015-10-12 NOTE — Progress Notes (Signed)
Carelink Summary Report / Loop Recorder 

## 2015-10-12 NOTE — Telephone Encounter (Signed)
Patient's order has been faxed for for Physical therapy  In [place] fax [phone]. I have to patient she is aware of details.

## 2015-10-17 ENCOUNTER — Ambulatory Visit: Payer: Managed Care, Other (non HMO) | Admitting: Physical Therapy

## 2015-10-18 LAB — CUP PACEART REMOTE DEVICE CHECK: MDC IDC SESS DTM: 20170130153637

## 2015-10-18 NOTE — Progress Notes (Signed)
[facility] summary report received. Battery status OK. Normal device function. No new symptom episodes, tachy episodes, brady, or pause episodes. No new AF episodes. Monthly summary reports and ROV/PRN 

## 2015-10-19 ENCOUNTER — Inpatient Hospital Stay: Payer: Managed Care, Other (non HMO)

## 2015-10-19 ENCOUNTER — Inpatient Hospital Stay: Payer: Managed Care, Other (non HMO) | Admitting: Internal Medicine

## 2015-10-22 LAB — CUP PACEART REMOTE DEVICE CHECK: Date Time Interrogation Session: 20170301153828

## 2015-10-22 NOTE — Progress Notes (Signed)
[facility] summary report received. Battery status OK. Normal device function. No new symptom episodes, tachy episodes, brady, or pause episodes. No new AF episodes. Monthly summary reports and ROV/PRN 

## 2015-10-31 ENCOUNTER — Ambulatory Visit: Payer: Managed Care, Other (non HMO) | Admitting: Physical Therapy

## 2015-11-02 ENCOUNTER — Inpatient Hospital Stay: Payer: Managed Care, Other (non HMO)

## 2015-11-02 ENCOUNTER — Inpatient Hospital Stay: Payer: Managed Care, Other (non HMO) | Admitting: Internal Medicine

## 2015-11-02 ENCOUNTER — Encounter: Payer: Self-pay | Admitting: *Deleted

## 2015-11-04 ENCOUNTER — Encounter: Payer: Self-pay | Admitting: Emergency Medicine

## 2015-11-04 ENCOUNTER — Emergency Department: Payer: Managed Care, Other (non HMO)

## 2015-11-04 ENCOUNTER — Telehealth: Payer: Self-pay | Admitting: Neurology

## 2015-11-04 ENCOUNTER — Emergency Department
Admission: EM | Admit: 2015-11-04 | Discharge: 2015-11-04 | Disposition: A | Discharge status: Home / Self Care | Payer: Managed Care, Other (non HMO) | Attending: Emergency Medicine | Admitting: Emergency Medicine

## 2015-11-04 DIAGNOSIS — S3993XA Unspecified injury of pelvis, initial encounter: Secondary | ICD-10-CM | POA: Insufficient documentation

## 2015-11-04 DIAGNOSIS — Y998 Other external cause status: Secondary | ICD-10-CM | POA: Insufficient documentation

## 2015-11-04 DIAGNOSIS — Y9389 Activity, other specified: Secondary | ICD-10-CM | POA: Diagnosis not present

## 2015-11-04 DIAGNOSIS — Z79899 Other long term (current) drug therapy: Secondary | ICD-10-CM | POA: Insufficient documentation

## 2015-11-04 DIAGNOSIS — Y9289 Other specified places as the place of occurrence of the external cause: Secondary | ICD-10-CM | POA: Diagnosis not present

## 2015-11-04 DIAGNOSIS — I10 Essential (primary) hypertension: Secondary | ICD-10-CM | POA: Insufficient documentation

## 2015-11-04 DIAGNOSIS — W19XXXA Unspecified fall, initial encounter: Secondary | ICD-10-CM

## 2015-11-04 DIAGNOSIS — R531 Weakness: Secondary | ICD-10-CM | POA: Diagnosis not present

## 2015-11-04 DIAGNOSIS — F17210 Nicotine dependence, cigarettes, uncomplicated: Secondary | ICD-10-CM | POA: Diagnosis not present

## 2015-11-04 DIAGNOSIS — S3992XA Unspecified injury of lower back, initial encounter: Secondary | ICD-10-CM | POA: Insufficient documentation

## 2015-11-04 DIAGNOSIS — W1839XA Other fall on same level, initial encounter: Secondary | ICD-10-CM | POA: Insufficient documentation

## 2015-11-04 DIAGNOSIS — Z7901 Long term (current) use of anticoagulants: Secondary | ICD-10-CM | POA: Insufficient documentation

## 2015-11-04 DIAGNOSIS — R29898 Other symptoms and signs involving the musculoskeletal system: Secondary | ICD-10-CM

## 2015-11-04 LAB — COMPREHENSIVE METABOLIC PANEL
ALBUMIN: 2.6 g/dL — AB (ref 3.5–5.0)
ALK PHOS: 78 U/L (ref 38–126)
ALT: 23 U/L (ref 14–54)
ANION GAP: 13 (ref 5–15)
AST: 45 U/L — AB (ref 15–41)
BILIRUBIN TOTAL: 1.3 mg/dL — AB (ref 0.3–1.2)
BUN: 8 mg/dL (ref 6–20)
CALCIUM: 8 mg/dL — AB (ref 8.9–10.3)
CO2: 24 mmol/L (ref 22–32)
CREATININE: 0.56 mg/dL (ref 0.44–1.00)
Chloride: 96 mmol/L — ABNORMAL LOW (ref 101–111)
GFR calc Af Amer: >60 mL/min (ref >60)
GFR calc non Af Amer: >60 mL/min (ref >60)
GLUCOSE: 79 mg/dL (ref 65–99)
Potassium: 2.9 mmol/L — CL (ref 3.5–5.1)
Sodium: 133 mmol/L — ABNORMAL LOW (ref 135–145)
TOTAL PROTEIN: 5.6 g/dL — AB (ref 6.5–8.1)

## 2015-11-04 LAB — CBC
HEMATOCRIT: 39.6 % (ref 35.0–47.0)
HEMOGLOBIN: 14 g/dL (ref 12.0–16.0)
MCH: 35.4 pg — ABNORMAL HIGH (ref 26.0–34.0)
MCHC: 35.2 g/dL (ref 32.0–36.0)
MCV: 100.5 fL — ABNORMAL HIGH (ref 80.0–100.0)
Platelets: 283 10*3/uL (ref 150–440)
RBC: 3.94 MIL/uL (ref 3.80–5.20)
RDW: 17.9 % — ABNORMAL HIGH (ref 11.5–14.5)
WBC: 10.5 10*3/uL (ref 3.6–11.0)

## 2015-11-04 LAB — CK: Total CK: 225 U/L (ref 38–234)

## 2015-11-04 MED ORDER — OXYCODONE-ACETAMINOPHEN 5-325 MG PO TABS
2.0000 | ORAL_TABLET | Freq: Once | ORAL | Status: AC
  Administered 2015-11-04: 2 via ORAL
  Filled 2015-11-04: qty 2

## 2015-11-04 NOTE — ED Notes (Signed)
Pt arrived from home by EMS. Pt states she is a "wall walker" meaning she has to "hold on as she walks". Pt states she fell about a week ago and rates her pain at an 8.

## 2015-11-04 NOTE — Telephone Encounter (Signed)
Rn sent message to [place].

## 2015-11-04 NOTE — Telephone Encounter (Signed)
Pt called in stating she fell last Thursday and since then she has been getting worse and worse. Her speech was very garbled almost as if her tongue was not moving but only her lips. She said her arms and hands felt like they were floating, she has increase difficulty walking as well. I suggested she go to the ER or an urgent care to be seen right way. Pt agreed and said she would go.

## 2015-11-04 NOTE — Discharge Instructions (Signed)
You have been seen in the emergency department today for a fall and lower extremity weakness. Your results including MRI of her lumbar spine and x-ray imaging are normal. As we discussed it is very important he follow-up with neurology as soon as possible for further workup of your lower extremity weakness. Please call the number provided to arrange follow up appointment as soon as possible.   Weakness Weakness is a lack of strength. It may be felt all over the body (generalized) or in one specific part of the body (focal). Some causes of weakness can be serious. You may need further medical evaluation, especially if you are elderly or you have a history of immunosuppression (such as chemotherapy or HIV), kidney disease, heart disease, or diabetes. CAUSES  Weakness can be caused by many different things, including:  Infection.  Physical exhaustion.  Internal bleeding or other blood loss that results in a lack of red blood cells (anemia).  Dehydration. This cause is more common in elderly people.  Side effects or electrolyte abnormalities from medicines, such as pain medicines or sedatives.  Emotional distress, anxiety, or depression.  Circulation problems, especially severe peripheral arterial disease.  Heart disease, such as rapid atrial fibrillation, bradycardia, or heart failure.  Nervous system disorders, such as Guillain-Barr syndrome, multiple sclerosis, or stroke. DIAGNOSIS  To find the cause of your weakness, your caregiver will take your history and perform a physical exam. Lab tests or X-rays may also be ordered, if needed. TREATMENT  Treatment of weakness depends on the cause of your symptoms and can vary greatly. HOME CARE INSTRUCTIONS   Rest as needed.  Eat a well-balanced diet.  Try to get some exercise every day.  Only take over-the-counter or prescription medicines as directed by your caregiver. SEEK MEDICAL CARE IF:   Your weakness seems to be getting worse  or spreads to other parts of your body.  You develop new aches or pains. SEEK IMMEDIATE MEDICAL CARE IF:   You cannot perform your normal daily activities, such as getting dressed and feeding yourself.  You cannot walk up and down stairs, or you feel exhausted when you do so.  You have shortness of breath or chest pain.  You have difficulty moving parts of your body.  You have weakness in only one area of the body or on only one side of the body.  You have a fever.  You have trouble speaking or swallowing.  You cannot control your bladder or bowel movements.  You have black or bloody vomit or stools. MAKE SURE YOU:  Understand these instructions.  Will watch your condition.  Will get help right away if you are not doing well or get worse.   This information is not intended to replace advice given to you by your health care provider. Make sure you discuss any questions you have with your health care provider.   Document Released: 07/30/2005 Document Revised: 01/29/2012 Document Reviewed: 09/28/2011 Elsevier Interactive Patient Education [employer].

## 2015-11-04 NOTE — ED Provider Notes (Signed)
[facility] Emergency Department Provider Note  Time seen: 10:29 AM  I have reviewed the triage vital signs and the nursing notes.   HISTORY  Chief Complaint Fall Leg weakness   HPI Tina Sharp is a 50 y.o. female with a past medical history of gastric reflux, hypertension, anxiety, CVA, presents the emergency department with pelvis pain, and generalized weakness. According to the patient 8 days ago she fell onto her buttocks and experienced significant pain in her buttocks. States since the fall she has been having a lot of pain to her buttocks, and weakness in bilateral legs. According to the patient she first began experiencing lower extremity weakness 2 months ago when she return to work per patient. She has been seen by [facility] (Dr. Willaim Rayas), as well as her primary care physician, and she has been referred to physical therapy. Patient states her generalized leg weakness continues to worsen and she is stuck in her bed all the time now. She does state she is able to get up on her own and go to the bathroom however. Denies any incontinence. Denies numbness just states for a week lower legs. Patient is tearful throughout examination.     Past Medical History  Diagnosis Date  . GERD (gastroesophageal reflux disease)   . Cancer [facility]) June 2015    Invasive lobular carcinoma, 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 ER/ PR+; Her 2 neu not overexpressed, microscopic positive margin (skin).  . Hypertension   . Depression   . Anxiety   . 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 Myalgia 09/08/2015  . Depression 09/06/2015  . Cerebrovascular accident (CVA) due to thrombosis of posterior cerebral artery ([place]) 08/18/2015  . 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 Stroke with cerebral ischemia ([place]) 07/12/2015  . HLD (hyperlipidemia) 07/12/2015  . CVA (cerebral infarction)  06/21/2015  . TIA (transient ischemic attack) 06/21/2015  . Macrocytic anemia 06/20/2015  . Headache 06/14/2015  . Cigarette smoker 06/14/2015  . Stroke (cerebrum) ([place]) cryptogenic R brain infarcts s/p IV tPA  06/11/2015  . Malignant neoplasm of breast (female), unspecified site 04/23/2014  . Breast cancer ([place]) 02/20/2014    Past Surgical History  Procedure Laterality Date  . Laparoscopic hysterectomy  2010    [facility]  . Breast surgery Left 04/08/14    mastectomy  . Reduction mammoplasty Right 04/08/14    Dr Tula Nakayama  . Colonoscopy    . Ep implantable device N/A 06/14/2015    Procedure: Loop Recorder Insertion;  Surgeon: Evans Lance, MD;  Location: [place] CV LAB;  Service: Cardiovascular;  Laterality: N/A;  . Tee without cardioversion N/A 06/14/2015    Procedure: TRANSESOPHAGEAL ECHOCARDIOGRAM (TEE);  Surgeon: Josue Hector, MD;  Location: [facility] ENDOSCOPY;  Service: Cardiovascular;  Laterality: N/A;  . Abdominal hysterectomy  2000    left both ovaries in  . Tonsillectomy and adenoidectomy      pt was 50 years old    Current Outpatient Rx  Name  Route  Sig  Dispense  Refill  . anastrozole (ARIMIDEX) 1 MG tablet   Oral   Take 1 tablet (1 mg total) by mouth every evening.   7 tablet   0   . citalopram (CELEXA) 10 MG tablet   Oral   Take 1 tablet (10 mg total) by mouth daily.   90 tablet   2   .  clonazePAM (KLONOPIN) 0.5 MG tablet   Oral   Take 0.5 mg by mouth 2 (two) times daily as needed for anxiety.         Marland Kitchen esomeprazole (NEXIUM) 20 MG capsule   Oral   Take 20 mg by mouth daily as needed (for heartburn).          . folic acid (FOLVITE) 1 MG tablet   Oral   Take 1 tablet (1 mg total) by mouth daily.   30 tablet   0   . Hypromellose (ARTIFICIAL TEARS OP)   Ophthalmic   Apply 2 drops to eye 2 (two) times daily as needed (for dry eyes).         . prochlorperazine (COMPAZINE) 5 MG tablet   Oral   Take 5 mg by mouth every 6 (six) hours as needed  for nausea or vomiting.         . traMADol (ULTRAM) 50 MG tablet   Oral   Take 0.5 tablets (25 mg total) by mouth every 6 (six) hours as needed.   30 tablet   2   . vitamin B-12 500 MCG tablet   Oral   Take 1 tablet (500 mcg total) by mouth daily.   30 tablet   0   . vitamin B-6 (PYRIDOXINE) 25 MG tablet   Oral   Take 1 tablet (25 mg total) by mouth daily.   90 tablet   3   . warfarin (COUMADIN) 5 MG tablet   Oral   Take 1 tablet (5 mg total) by mouth daily at 6 PM. Patient taking differently: Take 5 mg by mouth daily at 6 PM. 1/2 pill daily   30 tablet   1     Allergies Review of patient's allergies indicates no known allergies.  Family History  Problem Relation Age of Onset  . Breast cancer Mother   . Breast cancer Maternal Grandmother   . Cancer Maternal Grandfather 39    prostate  . Ovarian cancer Other   . Seizures Paternal Grandmother   . Seizures Paternal Grandfather     Social History Social History  Substance Use Topics  . Smoking status: Current Every Day Smoker -- 0.25 packs/day    Types: Cigarettes  . Smokeless tobacco: Never Used     Comment: E cig  . Alcohol Use: 1.8 oz/week    0 Standard drinks or equivalent, 1 Glasses of wine, 1 Cans of beer, 1 Shots of liquor per week     Comment: social    Review of Systems Constitutional: Negative for fever. Cardiovascular: Negative for chest pain. Respiratory: Negative for shortness of breath. Gastrointestinal: Negative for abdominal pain Genitourinary: Negative for dysuria. Musculoskeletal: Positive for lower back/sacral pain. Skin: Negative for rash. Neurological: Negative for headache. Denies numbness, states lower extremity weakness bilaterally, denies one side being more so than the other. 10-point ROS otherwise negative.  ____________________________________________   PHYSICAL EXAM:  Constitutional: Alert and oriented. Well appearing and in no distress. Tearful at times. Eyes: Normal  exam ENT   Head: Normocephalic and atraumatic.   Mouth/Throat: Mucous membranes are moist. Cardiovascular: Normal rate, regular rhythm. No murmur Respiratory: Normal respiratory effort without tachypnea nor retractions. Breath sounds are clear  Gastrointestinal: Soft and nontender. No distention. Musculoskeletal: Nontender with normal range of motion in all extremities. No lower extremity tenderness or edema. Neurologic:  Normal speech and language. No gross focal neurologic deficits. Patient is able to lift legs off the bed but states they  feel very weak. Equal grip strengths. No arm weakness noted. Skin:  Skin is warm, dry and intact.  Psychiatric: Tearful.  ____________________________________________     RADIOLOGY  CT L-spine negative. MRI L-spine negative. Pelvis x-ray negative.  ____________________________________________    INITIAL IMPRESSION / ASSESSMENT AND PLAN / ED COURSE  Pertinent labs & imaging results that were available during my care of the patient were reviewed by me and considered in my medical decision making (see chart for details).  Patient presents to the emergency department with lower extremity weakness and pain in her lower back/sacrum. Patient states she fell 8 days ago which is the cause of her pain. Denies any acute worsening of the pain states she is tired of it so she came to the emergency department today. She also states for the past 2-3 months she has been having lower extremity weakness which has been progressing. She denies incontinence, is able to walk by herself to the restroom per patient. Patient states she has been seen at [facility] as well as her primary care doctor for this and they recommended physical therapy which starts this Monday. We will obtain an MRI of the spine to rule out any type of spinal compression issues. We will check basic labs as well as a pelvic x-ray.  Patient has a pacemaker, cannot obtain an MRI. We will obtain  imaging of the pelvis, as well as a lumbar CT.  Imaging largely within normal limits. Patient was able to get an MRI, record her was MRI compatible. MRI shows no abnormalities to explain the patient's generalized weakness. I discussed with the patient given her progressive generalized weakness she needs to follow up with a neurologist for further testing. The patient is agreeable to this plan.  ____________________________________________   FINAL CLINICAL IMPRESSION(S) / ED DIAGNOSES  Back pain Fall Leg weakness    Harvest Dark, MD 11/04/15 1345

## 2015-11-07 ENCOUNTER — Encounter: Payer: Self-pay | Admitting: Emergency Medicine

## 2015-11-07 ENCOUNTER — Inpatient Hospital Stay
Admission: EM | Admit: 2015-11-07 | Discharge: 2015-11-10 | DRG: 066 | Disposition: A | Discharge status: Skilled Nursing Facility | Payer: Managed Care, Other (non HMO) | Attending: Internal Medicine | Admitting: Internal Medicine

## 2015-11-07 ENCOUNTER — Ambulatory Visit: Payer: Managed Care, Other (non HMO) | Admitting: Physical Therapy

## 2015-11-07 ENCOUNTER — Emergency Department: Payer: Managed Care, Other (non HMO)

## 2015-11-07 DIAGNOSIS — I639 Cerebral infarction, unspecified: Secondary | ICD-10-CM | POA: Diagnosis not present

## 2015-11-07 DIAGNOSIS — F329 Major depressive disorder, single episode, unspecified: Secondary | ICD-10-CM | POA: Diagnosis present

## 2015-11-07 DIAGNOSIS — E538 Deficiency of other specified B group vitamins: Secondary | ICD-10-CM | POA: Diagnosis present

## 2015-11-07 DIAGNOSIS — E559 Vitamin D deficiency, unspecified: Secondary | ICD-10-CM | POA: Diagnosis present

## 2015-11-07 DIAGNOSIS — I63541 Cerebral infarction due to unspecified occlusion or stenosis of right cerebellar artery: Principal | ICD-10-CM | POA: Diagnosis present

## 2015-11-07 DIAGNOSIS — K219 Gastro-esophageal reflux disease without esophagitis: Secondary | ICD-10-CM | POA: Diagnosis present

## 2015-11-07 DIAGNOSIS — F411 Generalized anxiety disorder: Secondary | ICD-10-CM | POA: Diagnosis present

## 2015-11-07 DIAGNOSIS — Z23 Encounter for immunization: Secondary | ICD-10-CM | POA: Diagnosis not present

## 2015-11-07 DIAGNOSIS — I10 Essential (primary) hypertension: Secondary | ICD-10-CM | POA: Diagnosis present

## 2015-11-07 DIAGNOSIS — Z7901 Long term (current) use of anticoagulants: Secondary | ICD-10-CM | POA: Diagnosis not present

## 2015-11-07 DIAGNOSIS — M6250 Muscle wasting and atrophy, not elsewhere classified, unspecified site: Secondary | ICD-10-CM | POA: Diagnosis present

## 2015-11-07 DIAGNOSIS — F1020 Alcohol dependence, uncomplicated: Secondary | ICD-10-CM | POA: Diagnosis present

## 2015-11-07 DIAGNOSIS — E876 Hypokalemia: Secondary | ICD-10-CM | POA: Diagnosis present

## 2015-11-07 DIAGNOSIS — Z8673 Personal history of transient ischemic attack (TIA), and cerebral infarction without residual deficits: Secondary | ICD-10-CM | POA: Diagnosis not present

## 2015-11-07 DIAGNOSIS — Z79899 Other long term (current) drug therapy: Secondary | ICD-10-CM

## 2015-11-07 DIAGNOSIS — R296 Repeated falls: Secondary | ICD-10-CM | POA: Diagnosis present

## 2015-11-07 DIAGNOSIS — Z803 Family history of malignant neoplasm of breast: Secondary | ICD-10-CM | POA: Diagnosis not present

## 2015-11-07 DIAGNOSIS — F17210 Nicotine dependence, cigarettes, uncomplicated: Secondary | ICD-10-CM | POA: Diagnosis present

## 2015-11-07 DIAGNOSIS — Z853 Personal history of malignant neoplasm of breast: Secondary | ICD-10-CM

## 2015-11-07 DIAGNOSIS — Z9114 Patient's other noncompliance with medication regimen: Secondary | ICD-10-CM | POA: Diagnosis not present

## 2015-11-07 DIAGNOSIS — R531 Weakness: Secondary | ICD-10-CM

## 2015-11-07 LAB — URINALYSIS COMPLETE WITH MICROSCOPIC (ARMC ONLY)
BACTERIA UA: NONE SEEN
Bilirubin Urine: NEGATIVE
GLUCOSE, UA: NEGATIVE mg/dL
NITRITE: NEGATIVE
Protein, ur: NEGATIVE mg/dL
SPECIFIC GRAVITY, URINE: 1.021 (ref 1.005–1.030)
pH: 6 (ref 5.0–8.0)

## 2015-11-07 LAB — CBC WITH DIFFERENTIAL/PLATELET
BASOS ABS: 0 10*3/uL (ref 0–0.1)
BASOS PCT: 0 %
EOS ABS: 0 10*3/uL (ref 0–0.7)
EOS PCT: 0 %
HCT: 39.3 % (ref 35.0–47.0)
Hemoglobin: 13.8 g/dL (ref 12.0–16.0)
LYMPHS PCT: 19 %
Lymphs Abs: 1.1 10*3/uL (ref 1.0–3.6)
MCH: 35.8 pg — ABNORMAL HIGH (ref 26.0–34.0)
MCHC: 35.2 g/dL (ref 32.0–36.0)
MCV: 101.7 fL — ABNORMAL HIGH (ref 80.0–100.0)
MONO ABS: 0.7 10*3/uL (ref 0.2–0.9)
Monocytes Relative: 13 %
Neutro Abs: 3.9 10*3/uL (ref 1.4–6.5)
Neutrophils Relative %: 68 %
PLATELETS: 197 10*3/uL (ref 150–440)
RBC: 3.86 MIL/uL (ref 3.80–5.20)
RDW: 18.1 % — AB (ref 11.5–14.5)
WBC: 5.8 10*3/uL (ref 3.6–11.0)

## 2015-11-07 LAB — COMPREHENSIVE METABOLIC PANEL
ALT: 28 U/L (ref 14–54)
AST: 57 U/L — AB (ref 15–41)
Albumin: 2.5 g/dL — ABNORMAL LOW (ref 3.5–5.0)
Alkaline Phosphatase: 79 U/L (ref 38–126)
Anion gap: 14 (ref 5–15)
BUN: 6 mg/dL (ref 6–20)
CHLORIDE: 100 mmol/L — AB (ref 101–111)
CO2: 20 mmol/L — AB (ref 22–32)
CREATININE: 0.53 mg/dL (ref 0.44–1.00)
Calcium: 7.7 mg/dL — ABNORMAL LOW (ref 8.9–10.3)
GFR calc Af Amer: >60 mL/min (ref >60)
Glucose, Bld: 91 mg/dL (ref 65–99)
POTASSIUM: 2.9 mmol/L — AB (ref 3.5–5.1)
SODIUM: 134 mmol/L — AB (ref 135–145)
Total Bilirubin: 1.3 mg/dL — ABNORMAL HIGH (ref 0.3–1.2)
Total Protein: 5.5 g/dL — ABNORMAL LOW (ref 6.5–8.1)

## 2015-11-07 LAB — APTT: aPTT: 28 seconds (ref 24–36)

## 2015-11-07 LAB — PROTIME-INR
INR: 1.35
PROTHROMBIN TIME: 16.8 s — AB (ref 11.4–15.0)

## 2015-11-07 MED ORDER — PANTOPRAZOLE SODIUM 40 MG PO TBEC
40.0000 mg | DELAYED_RELEASE_TABLET | Freq: Every day | ORAL | Status: DC
Start: 2015-11-08 — End: 2015-11-10
  Administered 2015-11-08 – 2015-11-10 (×3): 40 mg via ORAL
  Filled 2015-11-07 (×3): qty 1

## 2015-11-07 MED ORDER — TRAMADOL HCL 50 MG PO TABS
25.0000 mg | ORAL_TABLET | Freq: Four times a day (QID) | ORAL | Status: DC | PRN
  Administered 2015-11-07 – 2015-11-09 (×4): 25 mg via ORAL
  Filled 2015-11-07 (×4): qty 1

## 2015-11-07 MED ORDER — MORPHINE SULFATE (PF) 2 MG/ML IV SOLN
2.0000 mg | Freq: Four times a day (QID) | INTRAVENOUS | Status: DC | PRN
  Administered 2015-11-07 – 2015-11-09 (×5): 2 mg via INTRAVENOUS
  Filled 2015-11-07 (×5): qty 1

## 2015-11-07 MED ORDER — ARTIFICIAL TEARS OP OINT
TOPICAL_OINTMENT | Freq: Three times a day (TID) | OPHTHALMIC | Status: DC
  Administered 2015-11-08: 14:00:00 via OPHTHALMIC
  Filled 2015-11-07: qty 3.5

## 2015-11-07 MED ORDER — MORPHINE SULFATE (PF) 2 MG/ML IV SOLN
2.0000 mg | Freq: Once | INTRAVENOUS | Status: AC
  Administered 2015-11-07: 2 mg via INTRAVENOUS

## 2015-11-07 MED ORDER — ONDANSETRON HCL 4 MG/2ML IJ SOLN
4.0000 mg | Freq: Once | INTRAMUSCULAR | Status: AC
  Administered 2015-11-07: 4 mg via INTRAVENOUS
  Filled 2015-11-07: qty 2

## 2015-11-07 MED ORDER — STROKE: EARLY STAGES OF RECOVERY BOOK
Freq: Once | Status: AC
  Administered 2015-11-07: 21:00:00

## 2015-11-07 MED ORDER — VITAMIN B-12 1000 MCG PO TABS
500.0000 ug | ORAL_TABLET | Freq: Every day | ORAL | Status: DC
  Administered 2015-11-08 – 2015-11-10 (×3): 500 ug via ORAL
  Filled 2015-11-07 (×3): qty 1

## 2015-11-07 MED ORDER — PYRIDOXINE HCL 25 MG PO TABS
25.0000 mg | ORAL_TABLET | Freq: Every day | ORAL | Status: DC
Start: 2015-11-08 — End: 2015-11-10
  Administered 2015-11-08 – 2015-11-10 (×3): 25 mg via ORAL
  Filled 2015-11-07 (×3): qty 1

## 2015-11-07 MED ORDER — WARFARIN SODIUM 5 MG PO TABS: 10.0000 mg | ORAL_TABLET | Freq: Once | ORAL | Status: DC

## 2015-11-07 MED ORDER — TRAMADOL HCL 50 MG PO TABS
50.0000 mg | ORAL_TABLET | Freq: Once | ORAL | Status: DC
Start: 2015-11-07 — End: 2015-11-07

## 2015-11-07 MED ORDER — ANASTROZOLE 1 MG PO TABS
1.0000 mg | ORAL_TABLET | Freq: Every evening | ORAL | Status: DC
  Administered 2015-11-08 – 2015-11-09 (×2): 1 mg via ORAL
  Filled 2015-11-07 (×4): qty 1

## 2015-11-07 MED ORDER — POTASSIUM CHLORIDE CRYS ER 20 MEQ PO TBCR
40.0000 meq | EXTENDED_RELEASE_TABLET | ORAL | Status: AC
  Administered 2015-11-07 (×2): 40 meq via ORAL
  Filled 2015-11-07 (×2): qty 2

## 2015-11-07 MED ORDER — MORPHINE SULFATE (PF) 4 MG/ML IV SOLN
4.0000 mg | Freq: Once | INTRAVENOUS | Status: AC
Start: 2015-11-07 — End: 2015-11-07
  Administered 2015-11-07: 4 mg via INTRAVENOUS
  Filled 2015-11-07: qty 1

## 2015-11-07 MED ORDER — CITALOPRAM HYDROBROMIDE 10 MG PO TABS
10.0000 mg | ORAL_TABLET | Freq: Every day | ORAL | Status: DC
  Administered 2015-11-08 – 2015-11-09 (×2): 10 mg via ORAL
  Filled 2015-11-07 (×2): qty 1

## 2015-11-07 MED ORDER — FOLIC ACID 1 MG PO TABS
1.0000 mg | ORAL_TABLET | Freq: Every day | ORAL | Status: DC
  Administered 2015-11-08 – 2015-11-10 (×3): 1 mg via ORAL
  Filled 2015-11-07 (×3): qty 1

## 2015-11-07 MED ORDER — CLONAZEPAM 0.5 MG PO TABS: 0.5000 mg | ORAL_TABLET | Freq: Two times a day (BID) | ORAL | Status: DC | PRN

## 2015-11-07 MED ORDER — POLYETHYLENE GLYCOL 3350 17 G PO PACK
17.0000 g | PACK | Freq: Every day | ORAL | Status: DC | PRN
  Administered 2015-11-07: 17 g via ORAL
  Filled 2015-11-07: qty 1

## 2015-11-07 MED ORDER — WARFARIN - PHYSICIAN DOSING INPATIENT
Freq: Every day | Status: DC
  Filled 2015-11-07 (×2): qty 1

## 2015-11-07 MED ORDER — SENNOSIDES-DOCUSATE SODIUM 8.6-50 MG PO TABS
1.0000 | ORAL_TABLET | Freq: Every evening | ORAL | Status: DC | PRN
  Administered 2015-11-07: 1 via ORAL
  Filled 2015-11-07: qty 1

## 2015-11-07 MED ORDER — PROCHLORPERAZINE MALEATE 10 MG PO TABS
5.0000 mg | ORAL_TABLET | Freq: Four times a day (QID) | ORAL | Status: DC | PRN
  Filled 2015-11-07: qty 1

## 2015-11-07 MED ORDER — MORPHINE SULFATE (PF) 2 MG/ML IV SOLN
INTRAVENOUS | Status: AC
  Administered 2015-11-07: 2 mg via INTRAVENOUS
  Filled 2015-11-07: qty 1

## 2015-11-07 MED ORDER — GADOBENATE DIMEGLUMINE 529 MG/ML IV SOLN
15.0000 mL | Freq: Once | INTRAVENOUS | Status: AC | PRN
  Administered 2015-11-07: 13 mL via INTRAVENOUS

## 2015-11-07 NOTE — ED Notes (Signed)
Patient transported to MRI 

## 2015-11-07 NOTE — ED Notes (Signed)
Pt in via EMS with complaints of involuntary muscle contractions/movements over the last three weeks.  Pt ambulatory to restroom today and unable to get back up from toilet due to weakness in legs.  Pt A/Ox4, complains of back pain due to recent falls.  Contractures noted to right hand, pt able to speak but with stiffness noted to mouth.

## 2015-11-07 NOTE — ED Provider Notes (Addendum)
[facility] Emergency Department Provider Note  ____________________________________________  Time seen: Approximately 1:23 PM  I have reviewed the triage vital signs and the nursing notes.   HISTORY  Chief Complaint Extremity Weakness    HPI Tina Sharp is a 50 y.o. female with history of treated breast cancer, hypertension, history of CVA, GERD who presents for evaluation of several weeks of worsening generalized weakness, constant since onset, currently severe, no modifying factors. Patient is followed by neurology as an outpatient for this worsening weakness, she is scheduled to have EMG as an outpatient. She reports that today, her legs simply collapsed out from under her while she was attending teeth the bathroom. She has had weeks of worsening bilateral lower extremity weakness but reports that over the past 2 weeks or so she has also developed numbness and weakness in the arms/hands bilaterally. She has also had involuntary twitching/muscle contractions of her arms and legs over the past few weeks. She was seen in this emergency department on 11/04/2015 for weakness and fall with pain in her buttocks. She had an MRI of her lumbar spine done at that time and she was discharged home. She has had continued pain in the buttocks/tailbone since the fall but she did not fall today and has no new or traumatic pain complaints. She reports no improvement in for weakness. No chest pain, difficulty breathing, vomiting, diarrhea, fevers or chills. No bowel or bladder incontinence.   Past Medical History  Diagnosis Date  . GERD (gastroesophageal reflux disease)   . Cancer [facility]) June 2015    Invasive lobular carcinoma, 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 ER/ PR+; Her 2 neu not overexpressed, microscopic positive margin (skin).  . Hypertension   . Depression   . Anxiety   . 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 Myalgia 09/08/2015  . Depression 09/06/2015  . Cerebrovascular accident (CVA) due to thrombosis of posterior cerebral artery ([place]) 08/18/2015  . 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 Stroke with cerebral ischemia ([place]) 07/12/2015  . HLD (hyperlipidemia) 07/12/2015  . CVA (cerebral infarction) 06/21/2015  . TIA (transient ischemic attack) 06/21/2015  . Macrocytic anemia 06/20/2015  . Headache 06/14/2015  . Cigarette smoker 06/14/2015  . Stroke (cerebrum) ([place]) cryptogenic R brain infarcts s/p IV tPA  06/11/2015  . Malignant neoplasm of breast (female), unspecified site 04/23/2014  . Breast cancer ([place]) 02/20/2014    Past Surgical History  Procedure Laterality Date  . Laparoscopic hysterectomy  2010    [facility]  . Breast surgery Left 04/08/14    mastectomy  . Reduction mammoplasty Right 04/08/14    Dr Tula Nakayama  . Colonoscopy    . Ep implantable device N/A 06/14/2015    Procedure: Loop Recorder Insertion;  Surgeon: Evans Lance, MD;  Location: [place] CV LAB;  Service: Cardiovascular;  Laterality: N/A;  . Tee without cardioversion N/A 06/14/2015    Procedure: TRANSESOPHAGEAL ECHOCARDIOGRAM (TEE);  Surgeon: Josue Hector, MD;  Location: [facility] ENDOSCOPY;  Service: Cardiovascular;  Laterality: N/A;  . Abdominal hysterectomy  2000    left both ovaries in  . Tonsillectomy and adenoidectomy      pt was 50 years old    Current Outpatient Rx  Name  Route  Sig  Dispense  Refill  . anastrozole (ARIMIDEX) 1 MG tablet   Oral   Take 1 tablet (1 mg total)  by mouth every evening.   7 tablet   0   . citalopram (CELEXA) 10 MG tablet   Oral   Take 1 tablet (10 mg total) by mouth daily.   90 tablet   2   . clonazePAM (KLONOPIN) 0.5 MG tablet   Oral   Take 0.5 mg by mouth 2 (two) times daily as needed for anxiety.         Marland Kitchen esomeprazole (NEXIUM) 20 MG capsule   Oral   Take 20 mg by mouth daily as needed (for heartburn).          .  folic acid (FOLVITE) 1 MG tablet   Oral   Take 1 tablet (1 mg total) by mouth daily.   30 tablet   0   . Hypromellose (ARTIFICIAL TEARS OP)   Ophthalmic   Apply 2 drops to eye 2 (two) times daily as needed (for dry eyes).         . prochlorperazine (COMPAZINE) 5 MG tablet   Oral   Take 5 mg by mouth every 6 (six) hours as needed for nausea or vomiting.         . traMADol (ULTRAM) 50 MG tablet   Oral   Take 0.5 tablets (25 mg total) by mouth every 6 (six) hours as needed.   30 tablet   2   . vitamin B-12 500 MCG tablet   Oral   Take 1 tablet (500 mcg total) by mouth daily.   30 tablet   0   . vitamin B-6 (PYRIDOXINE) 25 MG tablet   Oral   Take 1 tablet (25 mg total) by mouth daily.   90 tablet   3   . warfarin (COUMADIN) 5 MG tablet   Oral   Take 1 tablet (5 mg total) by mouth daily at 6 PM. Patient taking differently: Take 5 mg by mouth daily at 6 PM. 1/2 pill daily   30 tablet   1     Allergies Review of patient's allergies indicates no known allergies.  Family History  Problem Relation Age of Onset  . Breast cancer Mother   . Breast cancer Maternal Grandmother   . Cancer Maternal Grandfather 65    prostate  . Ovarian cancer Other   . Seizures Paternal Grandmother   . Seizures Paternal Grandfather     Social History Social History  Substance Use Topics  . Smoking status: Current Every Day Smoker -- 0.25 packs/day    Types: Cigarettes  . Smokeless tobacco: Never Used     Comment: E cig  . Alcohol Use: 1.8 oz/week    0 Standard drinks or equivalent, 1 Glasses of wine, 1 Cans of beer, 1 Shots of liquor per week     Comment: social    Review of Systems Constitutional: No fever/chills Eyes: No visual changes. ENT: No sore throat. Cardiovascular: Denies chest pain. Respiratory: Denies shortness of breath. Gastrointestinal: No abdominal pain.  No nausea, no vomiting.  No diarrhea.  No constipation. Genitourinary: Negative for  dysuria. Musculoskeletal: Positive for pain in the buttocks/tailbone since fall 3 days ago. Skin: Negative for rash. Neurological: Negative for headaches, +generalised weakness and numbness.  10-point ROS otherwise negative.  ____________________________________________   PHYSICAL EXAM:  Filed Vitals:   11/07/15 1356 11/07/15 1402  BP:  145/96  Pulse:  101  Height:  5\' 2"  (1.575 m)  Weight:  145 lb (65.772 kg)  SpO2: 97% 100%     Constitutional: Alert and oriented.  Well appearing and in no acute distress. Eyes: Conjunctivae are normal. PERRL. EOMI. Head: Atraumatic. Nose: No congestion/rhinnorhea. Mouth/Throat: Mucous membranes are dry.  Oropharynx non-erythematous. Neck: No stridor.  No cervical spine tenderness to palpation. Cardiovascular: Normal rate, regular rhythm. Grossly normal heart sounds.  Good peripheral circulation. Respiratory: Normal respiratory effort.  No retractions. Lungs CTAB. Gastrointestinal: Soft and nontender. No distention.  No CVA tenderness. Genitourinary: deferred Musculoskeletal: No lower extremity tenderness nor edema.  No joint effusions. No midline tenderness to palpation throughout the T or the L-spine. Pelvis is stable to rock and compression. Full painless passive range of motion at the hip joints bilaterally. Neurologic:  Normal speech and language. 3 out of 5 strength in bilateral lower extremities with decreased sensation to light touch. 4 out of 5 strength in bilateral upper extremities with decreased sensation to light touch. Muscle wasting associated with the small muscles of the hands and surrounding the lips/face. Skin:  Skin is warm, dry and intact. No rash noted. Psychiatric: Mood and affect are normal. Speech and behavior are normal.  ____________________________________________   LABS (all labs ordered are listed, but only abnormal results are displayed)  Labs Reviewed  CBC WITH DIFFERENTIAL/PLATELET - Abnormal; Notable for the  following:    MCV 101.7 (*)    MCH 35.8 (*)    RDW 18.1 (*)    All other components within normal limits  COMPREHENSIVE METABOLIC PANEL - Abnormal; Notable for the following:    Sodium 134 (*)    Potassium 2.9 (*)    Chloride 100 (*)    CO2 20 (*)    Calcium 7.7 (*)    Total Protein 5.5 (*)    Albumin 2.5 (*)    AST 57 (*)    Total Bilirubin 1.3 (*)    All other components within normal limits  PROTIME-INR  APTT  URINALYSIS COMPLETEWITH MICROSCOPIC ([facility] ONLY)   ____________________________________________  EKG  ED ECG REPORT I, Joanne Gavel, the attending physician, personally viewed and interpreted this ECG.   Date: 11/07/2015  EKG Time: 14:13  Rate: 102  Rhythm: sinus tachycardia  Axis: normal  Intervals:none  ST&T Change: No acute ST elevation. Prolonged QTc at 557 ms. Q waves with T wave inversion in V2, V3. Nonspecific T-wave abnormality in V4.  ____________________________________________  RADIOLOGY  MRI brain and C-spine - pending ____________________________________________   PROCEDURES  Procedure(s) performed: None  Critical Care performed: No  ____________________________________________   INITIAL IMPRESSION / ASSESSMENT AND PLAN / ED COURSE  Pertinent labs & imaging results that were available during my care of the patient were reviewed by me and considered in my medical decision making (see chart for details).  Tina Sharp is a 50 y.o. female with history of treated breast cancer, hypertension, history of CVA, GERD who presents for evaluation of several weeks of worsening weakness in bilateral upper and lower extremities, worse in the lower extremities. No weakness associated or that she is now bedbound. On exam, she has no focal deficit but she does have significant weakness in the arms and legs as well as muscle wasting of the small muscles of the hands and the muscles surrounding the lips. I discussed the case with Dr. Irish Elders of  neurology who recommends beginning workup with MRI brain and C-spine with and without contrast given that this could represent multiple sclerosis, motor neuron disease, Guillain-Barr syndrome. We'll obtain screening labs as well and anticipate admission.  ----------------------------------------- 3:04 PM on 11/07/2015 ----------------------------------------- Labs reviewed. CBC generally  unremarkable. CMP with mild hypokalemia, mild AST elevation. Patient is away for MRI. Care transferred to Dr. Clearnce Hasten at this time. She is prescribed Coumadin and an INR and PTT were added on and are pending.  ____________________________________________   FINAL CLINICAL IMPRESSION(S) / ED DIAGNOSES  Final diagnoses:  Weakness  Muscle wasting      Joanne Gavel, MD 11/07/15 1506  Joanne Gavel, MD 11/07/15 [phone]

## 2015-11-07 NOTE — ED Notes (Signed)
Unable to obtain urine specimen due to pt "not feeling the urge" to void at this time.  Cup of water given to pt.

## 2015-11-07 NOTE — ED Provider Notes (Signed)
Signout from Dr. Madaline Savage to follow-up with the patient's labs as well as MRI results. Patient with increasing weakness and falls over the past month. Physical Exam  BP 156/106 mmHg  Pulse 108  Temp(Src) 98.1 F (36.7 C)  Resp 15  Ht 5\' 2"  (1.575 m)  Wt 145 lb (65.772 kg)  BMI 26.51 kg/m2  SpO2 96% ----------------------------------------- 5:50 PM on 11/07/2015 -----------------------------------------  Imaging Results       MR Brain W Wo Contrast (Final result) Result time: 11/07/15 16:55:30   Final result by Rad Results In Interface (11/07/15 16:55:30)   Narrative:   CLINICAL DATA: Weakness, muscle wasting, involuntary muscle contractions over the past 3 weeks.  EXAM: MRI HEAD WITHOUT AND WITH CONTRAST  TECHNIQUE: Multiplanar, multiecho pulse sequences of the brain and surrounding structures were obtained without and with intravenous contrast.  CONTRAST: 16mL MULTIHANCE GADOBENATE DIMEGLUMINE 529 MG/ML IV SOLN  COMPARISON: Head CT 04/04/2016 and MRI 06/21/2015  FINDINGS: There is a small region of cortical restricted diffusion in the right parieto-occipital region consistent with acute or early subacute infarct superimposed on an area of late subacute ischemia demonstrated on the prior MRI. There is a trace amount of associated petechial blood products. There are small areas of slightly increased trace diffusion signal involving cortex and subcortical white matter in the right frontal and parietal lobes without definite restricted diffusion on the ADC map, most consistent with older subacute infarcts and largely corresponding to the infarcts demonstrated on the prior MRI.  No mass, midline shift, or extra-axial fluid collection is seen. Slight cerebral atrophy is unchanged. There is mild enhancement associated with some of the infarcts.  Orbits are unremarkable. Paranasal sinuses and mastoid air cells are clear. Major intracranial vascular flow voids are  preserved.  IMPRESSION: 1. Small area of acute/early subacute infarct in the right parieto-occipital region. 2. Multiple late subacute infarcts in the right cerebral hemisphere as previously demonstrated.   Electronically Signed By: Logan Bores M.D. On: 11/07/2015 16:55          MR Cervical Spine W Wo Contrast (Final result) Result time: 11/07/15 17:02:53   Final result by Rad Results In Interface (11/07/15 17:02:53)   Narrative:   CLINICAL DATA: Weakness. Involuntary muscle contractions over the last 3 weeks.  EXAM: MRI CERVICAL SPINE WITHOUT AND WITH CONTRAST  TECHNIQUE: Multiplanar and multiecho pulse sequences of the cervical spine, to include the craniocervical junction and cervicothoracic junction, were obtained according to standard protocol without and with intravenous contrast.  CONTRAST: 32mL MULTIHANCE GADOBENATE DIMEGLUMINE 529 MG/ML IV SOLN  COMPARISON: Neck CTA 09/16/2015  FINDINGS: Multiple sequences are mildly to moderately motion degraded.  There is straightening of the normal cervical lordosis. There is trace retrolisthesis of C5 on C6 and C6 on C7, likely degenerative. Mild disc space narrowing and degenerative endplate spurring are present at C5-6 and C6-7. No significant vertebral marrow edema is seen. A left-sided atlanto-occipital joint effusion is present. No definite cervical spinal cord signal abnormality is identified, however evaluation is limited by motion artifact. No abnormal enhancement is seen. Paraspinal soft tissues are unremarkable.  C2-3: No disc herniation or stenosis.  C3-4: Minimal right uncovertebral spurring without stenosis.  C4-5: Minimal right uncovertebral spurring without stenosis.  C5-6: Broad-based posterior disc osteophyte complex results in mild bilateral neural foraminal stenosis. There is flattening of the ventral thecal sac and a slight impression on the ventral spinal cord without  significant spinal stenosis.  C6-7: Disc bulging and uncovertebral spurring result in mild spinal stenosis  with slight ventral cord flattening and mild left greater than right neural foraminal stenosis.  C7-T1: Negative.  IMPRESSION: 1. Mildly to moderately motion degraded examination. 2. Mild spinal stenosis and mild bilateral neural foraminal stenosis at C6-7. 3. Mild bilateral foraminal stenosis at C5-6.   Electronically Signed By: Logan Bores M.D. On: 11/07/2015 17:02       Physical Exam Patient resting comfortably and not clearing worsening of her neurologic status at this time. ED Course  Procedures    MDM Patient with multiple strokes with acute or early to subacute as well as late subacute. Imaging results with patient the family the need for admission. Pending INR at this time. We'll wait to further anticoagulate until INR has resulted. Patient and family understand the need for Mr. the hospital. Signed out to Dr. Posey Pronto of the internal medicine service.      Orbie Pyo, MD 11/07/15 [phone]

## 2015-11-07 NOTE — Telephone Encounter (Signed)
Agree with plan 

## 2015-11-07 NOTE — H&P (Addendum)
[facility] [place] NAME: Tina Sharp    MR#:  [id number]  DATE OF BIRTH:  1965/12/28  DATE OF ADMISSION:  11/07/2015  PRIMARY CARE PHYSICIAN: Chriss Czar, MD   REQUESTING/REFERRING PHYSICIAN: Risa Grill MD  CHIEF COMPLAINT:   Chief Complaint  Patient presents with  . Extremity Weakness    HISTORY OF PRESENT ILLNESS: Tina Sharp  is a 50 y.o. female with a known history of  crytogenic CVA who has chronic lower extremity weakness but has been seen in the emergency room since last week few times for falls and weakness. Was seen again with the same complaints. The ED physician did an MRI of her brain which shows small area of acute to early subacute infarct in the right parieto-occipital region. So we're asked to admit the patient for acute CVA. She is on chronic Coumadin therapy her INR is  subtherapeutic. Patient also complains of weakness in both of her hands. And difficulty with ambulation. She is required full assistance to ambulate by family members. She denies any swallowing difficulties or visual difficulties.       PAST MEDICAL HISTORY:   Past Medical History  Diagnosis Date  . GERD (gastroesophageal reflux disease)   . Cancer [facility]) June 2015    Invasive lobular carcinoma, 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 ER/ PR+; Her 2 neu not overexpressed, microscopic positive margin (skin).  . Hypertension   . Depression   . Anxiety   . 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 Laparoscopic hysterectomy  2010    [facility]  . Breast surgery Left 04/08/14    mastectomy  . Reduction mammoplasty Right 04/08/14    Dr Tula Nakayama  . Colonoscopy    . Ep implantable device N/A 06/14/2015    Procedure: Loop Recorder Insertion;  Surgeon: Evans Lance, MD;  Location: [place] [facility];  Service:  Cardiovascular;  Laterality: N/A;  . Tee without cardioversion N/A 06/14/2015    Procedure: TRANSESOPHAGEAL ECHOCARDIOGRAM (TEE);  Surgeon: Josue Hector, MD;  Location: [facility] ENDOSCOPY;  Service: Cardiovascular;  Laterality: N/A;  . Abdominal hysterectomy  2000    left both ovaries in  . Tonsillectomy and adenoidectomy      pt was 50 years old    SOCIAL HISTORY:  Social History  Substance Use Topics  . Smoking status: Current Every Day Smoker -- 0.25 packs/day    Types: Cigarettes  . Smokeless tobacco: Never Used     Comment: E cig  . Alcohol Use: 1.8 oz/week    0 Standard drinks or equivalent, 1 Glasses of wine, 1 Cans of beer, 1 Shots of liquor per week     Comment: social    FAMILY HISTORY:  Family History  Problem Relation Age of Onset  . Breast cancer Mother   . Breast cancer Maternal Grandmother   . Cancer Maternal Grandfather 2    prostate  . Ovarian cancer Other   . Seizures Paternal Grandmother   . Seizures Paternal Grandfather     DRUG ALLERGIES: No Known Allergies  REVIEW OF SYSTEMS:   CONSTITUTIONAL: No fever, fatigue or weakness.  EYES: No blurred or double vision.  EARS, NOSE, AND THROAT: No tinnitus or ear pain.  RESPIRATORY: No cough, shortness of breath, wheezing or hemoptysis.  CARDIOVASCULAR: No chest pain, orthopnea, edema.  GASTROINTESTINAL: No  nausea, vomiting, diarrhea or abdominal pain.  GENITOURINARY: No dysuria, hematuria.  ENDOCRINE: No polyuria, nocturia,  HEMATOLOGY: No anemia, easy bruising or bleeding SKIN: No rash or lesion. MUSCULOSKELETAL: No joint pain or arthritis.   NEUROLOGIC: Difficulty with ambulation, bilateral hand weakness lower extremity weakness patient has tingling sensation in her hands PSYCHIATRY: No anxiety or depression.   MEDICATIONS AT HOME:  Prior to Admission medications   Medication Sig Start Date End Date Taking? Authorizing Provider  anastrozole (ARIMIDEX) 1 MG tablet Take 1 tablet (1 mg total) by mouth every  evening. 06/15/15  Yes Cammie Sickle, MD  citalopram (CELEXA) 10 MG tablet Take 1 tablet (10 mg total) by mouth daily. 10/10/15  Yes Rosalin Hawking, MD  clonazePAM (KLONOPIN) 0.5 MG tablet Take 0.5 mg by mouth 2 (two) times daily as needed for anxiety.   Yes Historical Provider, MD  esomeprazole (NEXIUM) 20 MG capsule Take 20 mg by mouth daily as needed (for heartburn).    Yes Historical Provider, MD  folic acid (FOLVITE) 1 MG tablet Take 1 tablet (1 mg total) by mouth daily. 06/22/15  Yes Kelvin Cellar, MD  Hypromellose (ARTIFICIAL TEARS OP) Apply 2 drops to eye 2 (two) times daily as needed (for dry eyes).   Yes Historical Provider, MD  polyethylene glycol (MIRALAX / GLYCOLAX) packet Take 17 g by mouth daily as needed.   Yes Historical Provider, MD  prochlorperazine (COMPAZINE) 5 MG tablet Take 5 mg by mouth every 6 (six) hours as needed for nausea or vomiting.   Yes Historical Provider, MD  traMADol (ULTRAM) 50 MG tablet Take 0.5 tablets (25 mg total) by mouth every 6 (six) hours as needed. 07/12/15  Yes Rosalin Hawking, MD  vitamin B-12 500 MCG tablet Take 1 tablet (500 mcg total) by mouth daily. 06/22/15  Yes Kelvin Cellar, MD  vitamin B-6 (PYRIDOXINE) 25 MG tablet Take 1 tablet (25 mg total) by mouth daily. 07/12/15  Yes Rosalin Hawking, MD  warfarin (COUMADIN) 5 MG tablet Take 2.5 mg by mouth daily at 6 PM.   Yes Historical Provider, MD      PHYSICAL EXAMINATION:   VITAL SIGNS: Blood pressure 124/107, pulse 101, temperature 98.1 F (36.7 C), resp. rate 15, height 5\' 2"  (1.575 m), weight 65.772 kg (145 lb), SpO2 100 %.  GENERAL:  50 y.o.-year-old patient lying in the bed with no acute distress.  EYES: Pupils equal, round, reactive to light and accommodation. No scleral icterus. Extraocular muscles intact.  HEENT: Head atraumatic, normocephalic. Oropharynx and nasopharynx clear.  NECK:  Supple, no jugular venous distention. No thyroid enlargement, no tenderness.  LUNGS: Normal breath sounds  bilaterally, no wheezing, rales,rhonchi or crepitation. No use of accessory muscles of respiration.  CARDIOVASCULAR: S1, S2 normal. No murmurs, rubs, or gallops.  ABDOMEN: Soft, nontender, nondistended. Bowel sounds present. No organomegaly or mass.  EXTREMITIES: No pedal edema, cyanosis, or clubbing.  NEUROLOGIC: Cranial nerves II through XII are intact.  Strength 4 out of 5 in the upper extremity, right lower extremity with 4 out of 5 strength reflexes 2+ Babinski downgoing,  PSYCHIATRIC: The patient is alert and oriented x 3.  SKIN: No obvious rash, lesion, or ulcer.   LABORATORY PANEL:   CBC  Recent Labs Lab 11/04/15 1057 11/07/15 1325  WBC 10.5 5.8  HGB 14.0 13.8  HCT 39.6 39.3  PLT 283 197  MCV 100.5* 101.7*  MCH 35.4* 35.8*  MCHC 35.2 35.2  RDW 17.9* 18.1*  LYMPHSABS  --  1.1  MONOABS  --  0.7  EOSABS  --  0.0  BASOSABS  --  0.0   ------------------------------------------------------------------------------------------------------------------  Chemistries   Recent Labs Lab 11/04/15 1057 11/07/15 1325  NA 133* 134*  K 2.9* 2.9*  CL 96* 100*  CO2 24 20*  GLUCOSE 79 91  BUN 8 6  CREATININE 0.56 0.53  CALCIUM 8.0* 7.7*  AST 45* 57*  ALT 23 28  ALKPHOS 78 79  BILITOT 1.3* 1.3*   ------------------------------------------------------------------------------------------------------------------ estimated creatinine clearance is 73.2 mL/min (by C-G formula based on Cr of 0.53). ------------------------------------------------------------------------------------------------------------------ No results for input(s): TSH, T4TOTAL, T3FREE, THYROIDAB in the last 72 hours.  Invalid input(s): FREET3   Coagulation profile  Recent Labs Lab 11/07/15 1730  INR 1.35   ------------------------------------------------------------------------------------------------------------------- No results for input(s): DDIMER in the last 72  hours. -------------------------------------------------------------------------------------------------------------------  Cardiac Enzymes No results for input(s): CKMB, TROPONINI, MYOGLOBIN in the last 168 hours.  Invalid input(s): CK ------------------------------------------------------------------------------------------------------------------ Invalid input(s): POCBNP  ---------------------------------------------------------------------------------------------------------------  Urinalysis    Component Value Date/Time   COLORURINE AMBER* 06/21/2015 1115   COLORURINE YELLOW 04/08/2014 0848   APPEARANCEUR CLEAR 06/21/2015 1115   APPEARANCEUR CLEAR 04/08/2014 0848   LABSPEC 1.017 06/21/2015 1115   LABSPEC 1.020 04/08/2014 0848   PHURINE 5.5 06/21/2015 1115   PHURINE 5.0 04/08/2014 0848   GLUCOSEU NEGATIVE 06/21/2015 1115   GLUCOSEU NEGATIVE 04/08/2014 0848   HGBUR NEGATIVE 06/21/2015 1115   HGBUR NEGATIVE 04/08/2014 0848   BILIRUBINUR SMALL* 06/21/2015 1115   BILIRUBINUR 2+ 04/08/2014 0848   KETONESUR 15* 06/21/2015 1115   KETONESUR NEGATIVE 04/08/2014 0848   PROTEINUR NEGATIVE 06/21/2015 1115   PROTEINUR 30 mg/dL 04/08/2014 0848   UROBILINOGEN 1.0 06/21/2015 1115   NITRITE NEGATIVE 06/21/2015 1115   NITRITE NEGATIVE 04/08/2014 0848   LEUKOCYTESUR NEGATIVE 06/21/2015 1115   LEUKOCYTESUR NEGATIVE 04/08/2014 0848     RADIOLOGY: [name] Contrast  11/07/2015  CLINICAL DATA:  Weakness, muscle wasting, involuntary muscle contractions over the past 3 weeks. EXAM: MRI HEAD WITHOUT AND WITH CONTRAST TECHNIQUE: Multiplanar, multiecho pulse sequences of the brain and surrounding structures were obtained without and with intravenous contrast. CONTRAST:  24mL MULTIHANCE GADOBENATE DIMEGLUMINE 529 MG/ML IV SOLN COMPARISON:  Head CT 04/04/2016 and MRI 06/21/2015 FINDINGS: There is a small region of cortical restricted diffusion in the right parieto-occipital region consistent  with acute or early subacute infarct superimposed on an area of late subacute ischemia demonstrated on the prior MRI. There is a trace amount of associated petechial blood products. There are small areas of slightly increased trace diffusion signal involving cortex and subcortical white matter in the right frontal and parietal lobes without definite restricted diffusion on the ADC map, most consistent with older subacute infarcts and largely corresponding to the infarcts demonstrated on the prior MRI. No mass, midline shift, or extra-axial fluid collection is seen. Slight cerebral atrophy is unchanged. There is mild enhancement associated with some of the infarcts. Orbits are unremarkable. Paranasal sinuses and mastoid air cells are clear. Major intracranial vascular flow voids are preserved. IMPRESSION: 1. Small area of acute/early subacute infarct in the right parieto-occipital region. 2. Multiple late subacute infarcts in the right cerebral hemisphere as previously demonstrated. Electronically Signed   By: Logan Bores M.D.   On: 11/07/2015 16:55   Mr Cervical Spine W Wo Contrast  11/07/2015  CLINICAL DATA:  Weakness. Involuntary muscle contractions over the last 3 weeks. EXAM: MRI CERVICAL SPINE WITHOUT AND WITH CONTRAST TECHNIQUE: Multiplanar and multiecho pulse sequences of the  cervical spine, to include the craniocervical junction and cervicothoracic junction, were obtained according to standard protocol without and with intravenous contrast. CONTRAST:  55mL MULTIHANCE GADOBENATE DIMEGLUMINE 529 MG/ML IV SOLN COMPARISON:  Neck CTA 09/16/2015 FINDINGS: Multiple sequences are mildly to moderately motion degraded. There is straightening of the normal cervical lordosis. There is trace retrolisthesis of C5 on C6 and C6 on C7, likely degenerative. Mild disc space narrowing and degenerative endplate spurring are present at C5-6 and C6-7. No significant vertebral marrow edema is seen. A left-sided  atlanto-occipital joint effusion is present. No definite cervical spinal cord signal abnormality is identified, however evaluation is limited by motion artifact. No abnormal enhancement is seen. Paraspinal soft tissues are unremarkable. C2-3:  No disc herniation or stenosis. C3-4:  Minimal right uncovertebral spurring without stenosis. C4-5:  Minimal right uncovertebral spurring without stenosis. C5-6: Broad-based posterior disc osteophyte complex results in mild bilateral neural foraminal stenosis. There is flattening of the ventral thecal sac and a slight impression on the ventral spinal cord without significant spinal stenosis. C6-7: Disc bulging and uncovertebral spurring result in mild spinal stenosis with slight ventral cord flattening and mild left greater than right neural foraminal stenosis. C7-T1:  Negative. IMPRESSION: 1. Mildly to moderately motion degraded examination. 2. Mild spinal stenosis and mild bilateral neural foraminal stenosis at C6-7. 3. Mild bilateral foraminal stenosis at C5-6. Electronically Signed   By: Logan Bores M.D.   On: 11/07/2015 17:02    EKG: Orders placed or performed during the hospital encounter of 11/07/15  . ED EKG  . ED EKG  . EKG 12-Lead  . EKG 12-Lead    IMPRESSION AND PLAN: Patient is a 50 year old white female with history of having cryptogenic CVA including hemorrhage into the stroke in the past resents with difficulty with ambulation weakness  1. Acute CVA on chronic CVA has been diagnosed with cryptogenic CVA in the past: Her INR is subtherapeutic will give her 10 mg Coumadin dose 1 today. Recheck INR and adjust her Coumadin based on INR . Due to her history of hemorrhage into the stroke in the past based on the MRI that I reviewed from last she year I will not place patient on full dose anticoagulation at this point. I will also ask neurology to see if they have any other further input in her situation will obtain MRA and carotid Dopplers of the  brain as well as echocardiogram. Patient will need aggressive physical therapy  2. Generalized anxiety disorder continue clonazepam and Celexa  3. GERD continue Nexium or equal medication  4. Hypokalemia we'll replace her potassium  Miscellaneous patient  on full dose Coumadin for DVT prophylaxis    All the records are reviewed and case discussed with ED provider. Management plans discussed with the patient, family and they are in agreement.  CODE STATUS: Code Status History    Date Active Date Inactive Code Status Order ID Comments User Context   06/21/2015  4:39 PM 06/22/2015  8:36 PM Full Code [id number]  Radene Gunning, NP Inpatient   06/11/2015  5:13 PM 06/14/2015  8:01 PM Full Code [id number]  Alexis Goodell, MD Inpatient       TOTAL TIME TAKING CARE OF THIS PATIENT: 55 minutes.    Dustin Flock M.D on 11/07/2015 at 6:13 PM  Between 7am to 6pm - Pager - [phone]  After 6pm go to [email or web address] - password [email or web address] [place] [facility]  Office  [phone]  CC: Primary care physician; Chriss Czar, MD

## 2015-11-08 ENCOUNTER — Inpatient Hospital Stay: Payer: Managed Care, Other (non HMO)

## 2015-11-08 ENCOUNTER — Inpatient Hospital Stay: Admit: 2015-11-08 | Payer: Managed Care, Other (non HMO)

## 2015-11-08 DIAGNOSIS — I639 Cerebral infarction, unspecified: Secondary | ICD-10-CM

## 2015-11-08 LAB — BASIC METABOLIC PANEL
Anion gap: 10 (ref 5–15)
BUN: 11 mg/dL (ref 6–20)
CALCIUM: 8.1 mg/dL — AB (ref 8.9–10.3)
CO2: 24 mmol/L (ref 22–32)
CREATININE: 0.49 mg/dL (ref 0.44–1.00)
Chloride: 98 mmol/L — ABNORMAL LOW (ref 101–111)
GFR calc non Af Amer: >60 mL/min (ref >60)
GLUCOSE: 84 mg/dL (ref 65–99)
Potassium: 4 mmol/L (ref 3.5–5.1)
Sodium: 132 mmol/L — ABNORMAL LOW (ref 135–145)

## 2015-11-08 LAB — LIPID PANEL
CHOL/HDL RATIO: 2.3 ratio
Cholesterol: 158 mg/dL (ref 0–200)
HDL: 70 mg/dL (ref >40)
LDL CALC: 73 mg/dL (ref 0–99)
TRIGLYCERIDES: 73 mg/dL (ref <150)
VLDL: 15 mg/dL (ref 0–40)

## 2015-11-08 LAB — SEDIMENTATION RATE: Sed Rate: 22 mm/hr (ref 0–30)

## 2015-11-08 LAB — HEMOGLOBIN A1C: Hgb A1c MFr Bld: 5.2 % (ref 4.0–6.0)

## 2015-11-08 LAB — FOLATE: Folate: 14.6 ng/mL (ref >5.9)

## 2015-11-08 LAB — PROTIME-INR
INR: 1.21
Prothrombin Time: 15.5 seconds — ABNORMAL HIGH (ref 11.4–15.0)

## 2015-11-08 LAB — VITAMIN B12: Vitamin B-12: 641 pg/mL (ref 180–914)

## 2015-11-08 LAB — C-REACTIVE PROTEIN: CRP: 0.9 mg/dL (ref <1.0)

## 2015-11-08 MED ORDER — ACETAMINOPHEN 325 MG PO TABS: 650.0000 mg | ORAL_TABLET | Freq: Four times a day (QID) | ORAL | Status: DC | PRN

## 2015-11-08 MED ORDER — ATORVASTATIN CALCIUM 20 MG PO TABS
40.0000 mg | ORAL_TABLET | Freq: Every day | ORAL | Status: DC
  Administered 2015-11-08 – 2015-11-09 (×2): 40 mg via ORAL
  Filled 2015-11-08 (×2): qty 2

## 2015-11-08 MED ORDER — NICOTINE 21 MG/24HR TD PT24
21.0000 mg | MEDICATED_PATCH | Freq: Every day | TRANSDERMAL | Status: DC
  Administered 2015-11-08 – 2015-11-10 (×3): 21 mg via TRANSDERMAL
  Filled 2015-11-08 (×3): qty 1

## 2015-11-08 MED ORDER — BENZOCAINE 10 % MT GEL
Freq: Three times a day (TID) | OROMUCOSAL | Status: DC | PRN
  Filled 2015-11-08: qty 9.4

## 2015-11-08 MED ORDER — METOPROLOL TARTRATE 25 MG PO TABS
25.0000 mg | ORAL_TABLET | Freq: Two times a day (BID) | ORAL | Status: DC
  Administered 2015-11-08 – 2015-11-10 (×4): 25 mg via ORAL
  Filled 2015-11-08 (×5): qty 1

## 2015-11-08 MED ORDER — ADULT MULTIVITAMIN W/MINERALS CH
1.0000 | ORAL_TABLET | Freq: Every day | ORAL | Status: DC
Start: 2015-11-08 — End: 2015-11-10
  Administered 2015-11-08 – 2015-11-10 (×3): 1 via ORAL
  Filled 2015-11-08 (×3): qty 1

## 2015-11-08 MED ORDER — AMLODIPINE BESYLATE 5 MG PO TABS
5.0000 mg | ORAL_TABLET | Freq: Every day | ORAL | Status: DC
  Administered 2015-11-08 – 2015-11-10 (×3): 5 mg via ORAL
  Filled 2015-11-08 (×3): qty 1

## 2015-11-08 MED ORDER — WARFARIN SODIUM 5 MG PO TABS
5.0000 mg | ORAL_TABLET | Freq: Once | ORAL | Status: AC
  Administered 2015-11-08: 18:00:00 5 mg via ORAL
  Filled 2015-11-08: qty 1

## 2015-11-08 MED ORDER — GABAPENTIN 100 MG PO CAPS
100.0000 mg | ORAL_CAPSULE | Freq: Two times a day (BID) | ORAL | Status: DC
  Administered 2015-11-08 – 2015-11-10 (×5): 100 mg via ORAL
  Filled 2015-11-08 (×5): qty 1

## 2015-11-08 MED ORDER — WARFARIN - PHARMACIST DOSING INPATIENT
Freq: Every day | Status: DC
  Administered 2015-11-08: 18:00:00

## 2015-11-08 NOTE — Clinical Social Work Note (Signed)
Clinical Social Work Assessment  Patient Details  Name: Tina Sharp MRN: [id number] Date of Birth: 1966/07/28  Date of referral:  11/08/15               Reason for consult:  Facility Placement                Permission sought to share information with:  Family Supports Permission granted to share information::  Yes, Verbal Permission Granted  Name::     Husband and daughter, Tina Sharp and Tina Sharp   Housing/Transportation Living arrangements for the past 2 months:  Single Family Home Source of Information:  Patient, Adult Children Patient Interpreter Needed:  None Criminal Activity/Legal Involvement Pertinent to Current Situation/Hospitalization:  No - Comment as needed Significant Relationships:  Adult Children, Spouse Lives with:  Adult Children, Spouse Do you feel safe going back to the place where you live?  No (Pt needs STR) Need for family participation in patient care:  Yes (Comment)  Care giving concerns:  No care giving concerns identified.   Social Worker assessment / plan:  CSW met with pt to address consult for New SNF. CSW introduced herself and explained role of social work. CSW also explained the process of discharging to SNF for STR as recommended by PT. Pt is agreeable to placement. Pt also shared that her daughter was working on a bed at [facility]. CSW shared that she will follow up with facility regarding this. CSW initiated a SNF search and will follow up with bed offers. CSW called [facility] and facility is able to to offer a bed. Facility initiated [place] with [employer]. CSW will continue to follow.   Employment status:  Disabled (Comment on whether or not currently receiving Disability) Insurance information:  Managed Care PT Recommendations:  [place] / Referral to community resources:  [place]  Patient/Family's Response to care:  Pt and daughter were appreciative of CSW support.   Patient/Family's Understanding  of and Emotional Response to Diagnosis, Current Treatment, and Prognosis:  Pt understands that STR at SNF would be a benefit to her so that she can return home safely.   Emotional Assessment Appearance:  Appears stated age Attitude/Demeanor/Rapport:  Other (appropriate) Affect (typically observed):  Accepting, Adaptable, Pleasant Orientation:  Oriented to Self, Oriented to Place, Oriented to  Time, Oriented to Situation Alcohol / Substance use:  Never Used Psych involvement (Current and /or in the community):  No (Comment)  Discharge Needs  Concerns to be addressed:  Adjustment to Illness Readmission within the last 30 days:   No Current discharge risk:  Chronically ill Barriers to Discharge:  Continued Medical Work up   [employer], LCSW 11/08/2015, 4:24 PM

## 2015-11-08 NOTE — Evaluation (Signed)
Clinical/Bedside Swallow Evaluation Patient Details  Name: Tina Sharp MRN: [id number] Date of Birth: [birth year]  Today's Date: 11/08/2015 Time: SLP Start Time (ACUTE ONLY): 1100 SLP Stop Time (ACUTE ONLY): 1200 SLP Time Calculation (min) (ACUTE ONLY): 60 min  Past Medical History:  Past Medical History  Diagnosis Date  . GERD (gastroesophageal reflux disease)   . Cancer [facility]) June 2015    Invasive lobular carcinoma, 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 ER/ PR+; Her 2 neu not overexpressed, microscopic positive margin (skin).  . Hypertension   . Depression   . Anxiety   . 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 Laparoscopic hysterectomy  2010    [facility]  . Breast surgery Left 04/08/14    mastectomy  . Reduction mammoplasty Right 04/08/14    Dr Tula Nakayama  . Colonoscopy    . Ep implantable device N/A 06/14/2015    Procedure: Loop Recorder Insertion;  Surgeon: Evans Lance, MD;  Location: [facility];  Service: Cardiovascular;  Laterality: N/A;  . Tee without cardioversion N/A 06/14/2015    Procedure: TRANSESOPHAGEAL ECHOCARDIOGRAM (TEE);  Surgeon: Josue Hector, MD;  Location: [facility] ENDOSCOPY;  Service: Cardiovascular;  Laterality: N/A;  . Abdominal hysterectomy  2000    left both ovaries in  . Tonsillectomy and adenoidectomy      pt was 50 years old   HPI:  Pt is a 50 y.o. female with a known history of crytogenic CVA who has chronic lower extremity weakness, HTN, Anxiety, Depression, GERD who has been seen in the emergency room last week few times for falls and weakness. Was seen again with the same complaints. The ED physician did an MRI of her brain which shows small area of acute to early subacute infarct in the right parieto-occipital region. So we're asked to admit the patient for acute CVA. She is on chronic Coumadin therapy her INR is subtherapeutic.  Patient also complains of weakness in both of her hands. And difficulty with ambulation. She is required full assistance to ambulate by family members. She denies any swallowing difficulties or visual difficulties. Per MD, pt has been having increased difficulty coordinating upper extremity movements and reports she is unable to feed herself.Patient on Coumadin; admitted with subtherapeutic INR.Pt is awake, verbally conversive, however, pt appears to speak w/ a more closed mouth; limited ROM of the mandible thus limiting lingual ROM for speech although articulation was fair-good. Pt was A/Ox3; followed all instructions.    Assessment / Plan / Recommendation Clinical Impression  Pt appeared to present w/ adequate pharyngeal phase toleration of the trials assessed w/ no overt s/s of aspiration noted, however, pt exhibited moderate oral phase deficits c/b reduced lingual and mandible coordination/movement and impaired mastication. Pt appeared to better tolerate trials of puree consistency; well-broken down foods, soups. She continued to demo overall decreased coordination w/ bolus management and A-P transfer and slower oral phase time overall. Of note, as pt increased her energy w/ the lingual/oral movements during bolus management, movement in her UEs was noted to increased as well. Pt was able to utilize a straw for drinking exhibiting appropriate labial closure and good negative pressure. Pharyngeal swallows were timely w/ clear vocal quality following. Unsure of the etiology of the oral phase dysphagia at this time. Rec. continue f/u w/ Neurologist/MDs. Rec. a mech soft diet consistency and mash foods well w/  the addition of puree foods at the meals; thin liquids; aspiration precautions; meds in Puree - Crushed as able for eaiser swallowing.     Aspiration Risk  Mild aspiration risk    Diet Recommendation  Mech soft (mashed/broken down) w/ added puree foods in diet; thin liquids; aspiration and reflux  precautions; moistened foods.  Medication Administration: Crushed with puree    Other  Recommendations Recommended Consults:  (Dietician) Oral Care Recommendations: Oral care BID;Staff/trained caregiver to provide oral care   Follow up Recommendations   (TBD once MD has determined more etilogy)    Frequency and Duration min 3x week  2 weeks       Prognosis Prognosis for Safe Diet Advancement: Good Barriers to Reach Goals: Severity of deficits      Swallow Study   General Date of Onset: 11/07/15 HPI: Pt is a 50 y.o. female with a known history of crytogenic CVA who has chronic lower extremity weakness, HTN, Anxiety, Depression, GERD who has been seen in the emergency room last week few times for falls and weakness. Was seen again with the same complaints. The ED physician did an MRI of her brain which shows small area of acute to early subacute infarct in the right parieto-occipital region. So we're asked to admit the patient for acute CVA. She is on chronic Coumadin therapy her INR is subtherapeutic. Patient also complains of weakness in both of her hands. And difficulty with ambulation. She is required full assistance to ambulate by family members. She denies any swallowing difficulties or visual difficulties. Per MD, pt has been having increased difficulty coordinating upper extremity movements and reports she is unable to feed herself.Patient on Coumadin; admitted with subtherapeutic INR.Pt is awake, verbally conversive, however, pt appears to speak w/ a more closed mouth; limited ROM of the mandible thus limiting lingual ROM for speech although articulation was fair-good. Pt was A/Ox3; followed all instructions.  Type of Study: Bedside Swallow Evaluation Previous Swallow Assessment: none Diet Prior to this Study: Regular;Thin liquids Temperature Spikes Noted: No (wbc 5.8) Respiratory Status: Room air History of Recent Intubation: No Behavior/Cognition: Alert;Cooperative;Pleasant  mood Oral Cavity Assessment:  (reddened places where pt had bitten her cheek) Oral Care Completed by SLP: Recent completion by staff Oral Cavity - Dentition: Adequate natural dentition Vision: Functional for self-feeding Self-Feeding Abilities: Needs assist;Needs set up;Total assist Patient Positioning: Upright in bed (though her legs and shoulders were bothering her/pain) Baseline Vocal Quality: Low vocal intensity;Normal Volitional Cough: Strong Volitional Swallow: Able to elicit    Oral/Motor/Sensory Function Overall Oral Motor/Sensory Function: Mild impairment Facial ROM: Within Functional Limits Facial Symmetry: Within Functional Limits Lingual ROM: Within Functional Limits Lingual Symmetry: Within Functional Limits Lingual Strength: Within Functional Limits Velum: Within Functional Limits Mandible: Suspected CN V (Trigeminal) dysfunction;Impaired   Ice Chips Ice chips: Impaired Presentation: Spoon (1 trials (too cold)) Oral Phase Impairments: Impaired mastication;Reduced lingual movement/coordination Oral Phase Functional Implications: Prolonged oral transit (slight) Pharyngeal Phase Impairments:  (none)   Thin Liquid Thin Liquid: Impaired Presentation: Straw (assisted feeding; ~3 ozs total) Oral Phase Impairments: Reduced lingual movement/coordination Oral Phase Functional Implications:  (discoordinated A-P transit) Pharyngeal  Phase Impairments:  (none)    Nectar Thick Nectar Thick Liquid: Not tested   Honey Thick Honey Thick Liquid: Not tested   Puree Puree: Impaired Presentation: Spoon (fed; 8 trials) Oral Phase Impairments: Reduced lingual movement/coordination;Impaired mastication Oral Phase Functional Implications: Prolonged oral transit;Oral residue (slight x2) Pharyngeal Phase Impairments:  (none)   Solid  GO   Solid: Impaired Presentation:  (fed; 2 trials) Oral Phase Impairments: Reduced lingual movement/coordination;Impaired mastication Oral Phase  Functional Implications: Impaired mastication;Prolonged oral transit;Oral residue (slight) Pharyngeal Phase Impairments:  (none)         Orinda Kenner, MS, CCC-SLP  Suzan Manon 11/08/2015,2:37 PM

## 2015-11-08 NOTE — NC FL2 (Signed)
[place] LEVEL OF CARE SCREENING TOOL     IDENTIFICATION  Patient Name: Tina Sharp Birthdate: 10-14-1965 Sex: female Admission Date (Current Location): 11/07/2015  [place] and [place] Number:  Engineering geologist and Address:  [facility], [address] [place], [place] [address]      Provider Number: [id number]  Attending Physician Name and Address:  Gladstone Lighter, MD  Relative Name and Phone Number:       Current Level of Care: Hospital Recommended Level of Care: [place] Prior Approval Number:    Date Approved/Denied:   PASRR Number:    Discharge Plan: SNF    Current Diagnoses: Patient Active Problem List   Diagnosis Date Noted  . Myalgia 09/08/2015  . Depression 09/06/2015  . Cerebrovascular accident (CVA) due to thrombosis of posterior cerebral artery ([place]) 08/18/2015  . 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 Stroke with cerebral ischemia ([place]) 07/12/2015  . HLD (hyperlipidemia) 07/12/2015  . CVA (cerebral infarction) 06/21/2015  . TIA (transient ischemic attack) 06/21/2015  . Macrocytic anemia 06/20/2015  . Headache 06/14/2015  . Cigarette smoker 06/14/2015  . Stroke (cerebrum) ([place]) cryptogenic R brain infarcts s/p IV tPA  06/11/2015  . Malignant neoplasm of breast (female), unspecified site 04/23/2014  . Breast cancer ([place]) 02/20/2014    Orientation RESPIRATION BLADDER Height & Weight     Self, Time, Situation, Place  Normal Continent Weight: 145 lb (65.772 kg) Height:  5\' 2"  (157.5 cm)  BEHAVIORAL SYMPTOMS/MOOD NEUROLOGICAL BOWEL NUTRITION STATUS      Continent Diet (Dys 3, Thin Liquids)  AMBULATORY STATUS COMMUNICATION OF NEEDS Skin   Extensive Assist Verbally Normal                       Personal Care Assistance Level of Assistance  Bathing, Feeding, Dressing Bathing Assistance: Limited assistance Feeding assistance: Limited assistance Dressing Assistance: Limited  assistance     Functional Limitations Info  Sight, Hearing, Speech Sight Info: Adequate Hearing Info: Adequate Speech Info: Adequate    SPECIAL CARE FACTORS FREQUENCY  PT (By licensed PT), OT (By licensed OT), Speech therapy     PT Frequency: 5 OT Frequency: 5     Speech Therapy Frequency: 5      Contractures      Additional Factors Info  Code Status, Allergies, Psychotropic Code Status Info: DNR Allergies Info: No known allergies Psychotropic Info: Medications         Current Medications (11/08/2015):  This is the current hospital active medication list Current Facility-Administered Medications  Medication Dose Route Frequency Provider Last Rate Last Dose  . acetaminophen (TYLENOL) tablet 650 mg  650 mg Oral Q6H PRN Gladstone Lighter, MD      . amLODipine (NORVASC) tablet 5 mg  5 mg Oral Daily Gladstone Lighter, MD      . anastrozole (ARIMIDEX) tablet 1 mg  1 mg Oral QPM Dustin Flock, MD   1 mg at 11/07/15 2200  . artificial tears (LACRILUBE) ophthalmic ointment   Both Eyes 3 times per day Dustin Flock, MD      . atorvastatin (LIPITOR) tablet 40 mg  40 mg Oral q1800 Gladstone Lighter, MD      . benzocaine (ORAJEL) 10 % mucosal gel   Mouth/Throat TID PRN Gladstone Lighter, MD      . citalopram (CELEXA) tablet 10 mg  10 mg Oral Daily Dustin Flock, MD   10 mg at 11/08/15 0805  . clonazePAM (KLONOPIN) tablet  0.5 mg  0.5 mg Oral BID PRN Dustin Flock, MD      . folic acid (FOLVITE) tablet 1 mg  1 mg Oral Daily Dustin Flock, MD   1 mg at 11/08/15 0805  . gabapentin (NEURONTIN) capsule 100 mg  100 mg Oral BID Gladstone Lighter, MD      . metoprolol tartrate (LOPRESSOR) tablet 25 mg  25 mg Oral BID Gladstone Lighter, MD      . morphine 2 MG/ML injection 2 mg  2 mg Intravenous Q6H PRN Lance Coon, MD   2 mg at 11/08/15 1036  . multivitamin with minerals tablet 1 tablet  1 tablet Oral Daily Gladstone Lighter, MD      . nicotine (NICODERM CQ - dosed in mg/24 hours) patch  21 mg  21 mg Transdermal Daily Gladstone Lighter, MD      . pantoprazole (PROTONIX) EC tablet 40 mg  40 mg Oral QAC breakfast Dustin Flock, MD   40 mg at 11/08/15 0804  . polyethylene glycol (MIRALAX / GLYCOLAX) packet 17 g  17 g Oral Daily PRN Dustin Flock, MD   17 g at 11/07/15 2303  . prochlorperazine (COMPAZINE) tablet 5 mg  5 mg Oral Q6H PRN Dustin Flock, MD      . pyridOXINE (VITAMIN B-6) tablet 25 mg  25 mg Oral Daily Dustin Flock, MD   25 mg at 11/08/15 0807  . senna-docusate (Senokot-S) tablet 1 tablet  1 tablet Oral QHS PRN Lance Coon, MD   1 tablet at 11/07/15 2303  . traMADol (ULTRAM) tablet 25 mg  25 mg Oral Q6H PRN Dustin Flock, MD   25 mg at 11/08/15 1338  . vitamin B-12 (CYANOCOBALAMIN) tablet 500 mcg  500 mcg Oral Daily Dustin Flock, MD   500 mcg at 11/08/15 0804  . warfarin (COUMADIN) tablet 5 mg  5 mg Oral ONCE-1800 Gladstone Lighter, MD      . Warfarin - Pharmacist Dosing Inpatient   Does not apply [id number] Gladstone Lighter, MD         Discharge Medications: Please see discharge summary for a list of discharge medications.  Relevant Imaging Results:  Relevant Lab Results:   Additional Information SSN:  [id number]  Darden Dates, LCSW

## 2015-11-08 NOTE — Progress Notes (Signed)
[facility] NAME: Tina Sharp    MR#:  [id number]  DATE OF BIRTH:  04/05/1966  SUBJECTIVE:  CHIEF COMPLAINT:   Chief Complaint  Patient presents with  . Extremity Weakness   - complains of mouth pain, lips hurting due to skin breakdown - generalized weakness noted - admitted for recurrent CVA  REVIEW OF SYSTEMS:  Review of Systems  Constitutional: Positive for malaise/fatigue. Negative for fever and chills.  HENT: Negative for ear discharge, ear pain and nosebleeds.        Corners of lips with erythema and skin breakdown  Respiratory: Negative for cough, shortness of breath and wheezing.   Cardiovascular: Negative for chest pain, palpitations and leg swelling.  Gastrointestinal: Negative for nausea, vomiting, abdominal pain, diarrhea and constipation.  Genitourinary: Negative for dysuria and urgency.  Musculoskeletal: Positive for myalgias and joint pain.  Neurological: Positive for tingling, tremors and focal weakness. Negative for dizziness, seizures and headaches.  Psychiatric/Behavioral: Negative for depression.    DRUG ALLERGIES:  No Known Allergies  VITALS:  Blood pressure 158/87, pulse 102, temperature 98.5 F (36.9 C), temperature source Oral, resp. rate 20, height 5\' 2"  (1.575 m), weight 65.772 kg (145 lb), SpO2 95 %.  PHYSICAL EXAMINATION:  Physical Exam  GENERAL:  50 y.o.-year-old patient lying in the bed with no acute distress.  EYES: Pupils equal, round, reactive to light and accommodation. No scleral icterus. Extraocular muscles intact.  HEENT: Head atraumatic, normocephalic. Oropharynx and nasopharynx clear. Red beefy tongue with lost papillae, chelitis at the corners of the mouth noted. NECK:  Supple, no jugular venous distention. No thyroid enlargement, no tenderness.  LUNGS: Normal breath sounds bilaterally, no wheezing, rales,rhonchi or crepitation. No use of accessory muscles of respiration.   CARDIOVASCULAR: S1, S2 normal. No murmurs, rubs, or gallops.  ABDOMEN: Soft, nontender, nondistended. Bowel sounds present. No organomegaly or mass.  EXTREMITIES: No pedal edema, cyanosis, or clubbing.  NEUROLOGIC: Cranial nerves II through XII are intact. Muscle strength 3/5 in both lower extremities and 3/5 LUE and 4/5 RUE. Sensation intact. Gait not checked.  PSYCHIATRIC: The patient is alert and oriented x 3.  SKIN: No obvious rash, lesion, or ulcer.    LABORATORY PANEL:   CBC  Recent Labs Lab 11/07/15 1325  WBC 5.8  HGB 13.8  HCT 39.3  PLT 197   ------------------------------------------------------------------------------------------------------------------  Chemistries   Recent Labs Lab 11/07/15 1325  NA 134*  K 2.9*  CL 100*  CO2 20*  GLUCOSE 91  BUN 6  CREATININE 0.53  CALCIUM 7.7*  AST 57*  ALT 28  ALKPHOS 79  BILITOT 1.3*   ------------------------------------------------------------------------------------------------------------------  Cardiac Enzymes No results for input(s): TROPONINI in the last 168 hours. ------------------------------------------------------------------------------------------------------------------  RADIOLOGY:  [name] Contrast  11/07/2015  CLINICAL DATA:  Weakness, muscle wasting, involuntary muscle contractions over the past 3 weeks. EXAM: MRI HEAD WITHOUT AND WITH CONTRAST TECHNIQUE: Multiplanar, multiecho pulse sequences of the brain and surrounding structures were obtained without and with intravenous contrast. CONTRAST:  37mL MULTIHANCE GADOBENATE DIMEGLUMINE 529 MG/ML IV SOLN COMPARISON:  Head CT 04/04/2016 and MRI 06/21/2015 FINDINGS: There is a small region of cortical restricted diffusion in the right parieto-occipital region consistent with acute or early subacute infarct superimposed on an area of late subacute ischemia demonstrated on the prior MRI. There is a trace amount of associated petechial blood products.  There are small areas of slightly increased trace diffusion signal involving cortex and  subcortical white matter in the right frontal and parietal lobes without definite restricted diffusion on the ADC map, most consistent with older subacute infarcts and largely corresponding to the infarcts demonstrated on the prior MRI. No mass, midline shift, or extra-axial fluid collection is seen. Slight cerebral atrophy is unchanged. There is mild enhancement associated with some of the infarcts. Orbits are unremarkable. Paranasal sinuses and mastoid air cells are clear. Major intracranial vascular flow voids are preserved. IMPRESSION: 1. Small area of acute/early subacute infarct in the right parieto-occipital region. 2. Multiple late subacute infarcts in the right cerebral hemisphere as previously demonstrated. Electronically Signed   By: Logan Bores M.D.   On: 11/07/2015 16:55   Mr Cervical Spine W Wo Contrast  11/07/2015  CLINICAL DATA:  Weakness. Involuntary muscle contractions over the last 3 weeks. EXAM: MRI CERVICAL SPINE WITHOUT AND WITH CONTRAST TECHNIQUE: Multiplanar and multiecho pulse sequences of the cervical spine, to include the craniocervical junction and cervicothoracic junction, were obtained according to standard protocol without and with intravenous contrast. CONTRAST:  63mL MULTIHANCE GADOBENATE DIMEGLUMINE 529 MG/ML IV SOLN COMPARISON:  Neck CTA 09/16/2015 FINDINGS: Multiple sequences are mildly to moderately motion degraded. There is straightening of the normal cervical lordosis. There is trace retrolisthesis of C5 on C6 and C6 on C7, likely degenerative. Mild disc space narrowing and degenerative endplate spurring are present at C5-6 and C6-7. No significant vertebral marrow edema is seen. A left-sided atlanto-occipital joint effusion is present. No definite cervical spinal cord signal abnormality is identified, however evaluation is limited by motion artifact. No abnormal enhancement is seen.  Paraspinal soft tissues are unremarkable. C2-3:  No disc herniation or stenosis. C3-4:  Minimal right uncovertebral spurring without stenosis. C4-5:  Minimal right uncovertebral spurring without stenosis. C5-6: Broad-based posterior disc osteophyte complex results in mild bilateral neural foraminal stenosis. There is flattening of the ventral thecal sac and a slight impression on the ventral spinal cord without significant spinal stenosis. C6-7: Disc bulging and uncovertebral spurring result in mild spinal stenosis with slight ventral cord flattening and mild left greater than right neural foraminal stenosis. C7-T1:  Negative. IMPRESSION: 1. Mildly to moderately motion degraded examination. 2. Mild spinal stenosis and mild bilateral neural foraminal stenosis at C6-7. 3. Mild bilateral foraminal stenosis at C5-6. Electronically Signed   By: Logan Bores M.D.   On: 11/07/2015 17:02   US Carotid Bilateral  11/08/2015  CLINICAL DATA:  50 year old female with an acute/subacute infarct in the right parietal occipital region. EXAM: BILATERAL CAROTID DUPLEX ULTRASOUND TECHNIQUE: Pearline Cables scale imaging, color Doppler and duplex ultrasound were performed of bilateral carotid and vertebral arteries in the neck. COMPARISON:  Brain MRI 11/07/2015 FINDINGS: Criteria: Quantification of carotid stenosis is based on velocity parameters that correlate the residual internal carotid diameter with NASCET-based stenosis levels, using the diameter of the distal internal carotid lumen as the denominator for stenosis measurement. The following velocity measurements were obtained: RIGHT ICA:  43/6 cm/sec CCA:  123XX123 cm/sec SYSTOLIC ICA/CCA RATIO:  0.8 DIASTOLIC ICA/CCA RATIO:  1.0 ECA:  60 cm/sec LEFT ICA:  58/22 cm/sec CCA:  0000000 cm/sec SYSTOLIC ICA/CCA RATIO:  0.9 DIASTOLIC ICA/CCA RATIO:  1.0 ECA:  80 cm/sec RIGHT CAROTID ARTERY: Trace smooth hypoechoic atherosclerotic plaque in the proximal internal carotid artery. By peak systolic  velocity criteria, the estimated stenosis is less than 50%. RIGHT VERTEBRAL ARTERY:  Patent with antegrade flow LEFT CAROTID ARTERY: Focal heterogeneous atherosclerotic plaque in the proximal internal carotid artery. By peak systolic velocity criteria  the estimated stenosis remains less than 50%. LEFT VERTEBRAL ARTERY:  Patent with normal antegrade flow. IMPRESSION: 1. Mild (1-49%) stenosis proximal right internal carotid artery secondary to trace smooth hypoechoic atherosclerotic plaque. 2. Mild (1-49%) stenosis proximal left internal carotid artery secondary to heterogenous atherosclerotic plaque. 3. Vertebral arteries are patent with normal antegrade flow. Signed, Criselda Peaches, MD [facility] Radiology Electronically Signed   By: Jacqulynn Cadet M.D.   On: 11/08/2015 10:27   Mr Jodene Nam Head/brain Wo Cm  11/08/2015  CLINICAL DATA:  Abnormal gait. Multiple falls. Weakness in both hands. Subacute infarctions involving the right parieto-occipital region and anterior right frontal lobe. EXAM: MRA HEAD WITHOUT CONTRAST TECHNIQUE: Angiographic images of the Circle of Willis were obtained using MRA technique without intravenous contrast. COMPARISON:  MRI brain 11/07/2015. FINDINGS: The internal carotid arteries are within normal limits from the high cervical segments through the ICA termini bilaterally. The anterior communicating artery is patent. 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 The left A1 segment is intact. M1 segments are normal bilaterally. The MCA bifurcations are intact. There is moderate attenuation of distal MCA branch vessels bilaterally. The left vertebral artery is slightly dominant to the right. The left PICA origin is visualized and normal. The right AICA is dominant. The basilar artery is normal. The right posterior cerebral artery is of fetal type. There is some attenuation of distal PCA branch vessels bilaterally. IMPRESSION: 1. No significant  proximal stenosis, aneurysm, or branch vessel occlusion. 2. 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 3. Mild-to-moderate distal branch vessel narrowing in the ACA and PCA distributions bilaterally. Electronically Signed   By: San Morelle M.D.   On: 11/08/2015 10:10    EKG:   Orders placed or performed during the hospital encounter of 11/07/15  . ED EKG  . ED EKG  . EKG 12-Lead  . 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 Hypercoagulable workup came back negative. -She had TTE and TEE which were unremarkable and also loop recorder placed at the time. -She has been on Coumadin since November 2016. -Prior to admission patient also has been drinking heavily in the. Also has known B12 deficiency is but hasn't been taking her supplements. -Exam reveals generalized weakness in both lower extremities, not only left-sided weakness which would coordinate with her new infarcts. - Appreciate neurology consult -Adjust coumadin dose so it can be therapeutic between 2-3 range - statin added, though LDL within normal limits - MRI with new acute right parieto occipital infarcts- pt continues to smoke - Physical therapy consult, occupational therapy consults requested. Likely will need rehabilitation this time  #2 Chelitis and paresthesias- B12 level is pending. Continue B complex supplements -Orajel for pain and also Neurontin added.  #3 Tobacco abuse and also alcohol abuse- nicotine patch and  CIWA protocol Counseled  #4 hypokalemia-replaced. Recheck today  #5 HTN- added  metoprolol and norvasc  #6 DVT prophylaxis-on Coumadin   Likely will need rehabilitation    All the records are reviewed and case discussed with Care Management/Social Workerr. Management plans discussed with the patient, family and they are in agreement.  CODE STATUS: Full code  TOTAL TIME TAKING CARE OF THIS PATIENT: 38 minutes.   POSSIBLE D/C IN 1-2 DAYS, DEPENDING ON CLINICAL CONDITION.   Gladstone Lighter M.D on 11/08/2015 at 2:59 PM  Between 7am to 6pm - Pager - [phone]  After 6pm go to [email or web address] - password [email or web address] [facility]  Office  [phone]  CC: Primary care physician; Chriss Czar, MD

## 2015-11-08 NOTE — Consult Note (Signed)
Referring Physician: Tressia Miners    Chief Complaint: Worsening weakness  HPI: Tina Sharp is an 50 y.o. female with a history of cryptogenic stroke (full work up in November of last year) followed by Dr. Leonie Man who presents after a 2 week history of worsening lower extremity weakness and difficulty coordinating upper extremity movements.  Patient has fallen several times.  Reports she is unable to feed herself.  Patient on Coumadin.  Admitted with subtherapeutic INR.    Date last known well: Unable to determine Time last known well: Unable to determine tPA Given: No: Unable to determine LKW  Past Medical History  Diagnosis Date  . GERD (gastroesophageal reflux disease)   . Cancer [facility]) June 2015    Invasive lobular carcinoma, 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 ER/ PR+; Her 2 neu not overexpressed, microscopic positive margin (skin).  . Hypertension   . Depression   . Anxiety   . Stroke ([place]) 06/11/2015    cerebrum, cryptogenic right brain infarcts s/p IV TPA    Past Surgical History  Procedure Laterality Date  . Laparoscopic hysterectomy  2010    [facility]  . Breast surgery Left 04/08/14    mastectomy  . Reduction mammoplasty Right 04/08/14    Dr Tula Nakayama  . Colonoscopy    . Ep implantable device N/A 06/14/2015    Procedure: Loop Recorder Insertion;  Surgeon: Evans Lance, MD;  Location: [place] [facility];  Service: Cardiovascular;  Laterality: N/A;  . Tee without cardioversion N/A 06/14/2015    Procedure: TRANSESOPHAGEAL ECHOCARDIOGRAM (TEE);  Surgeon: Josue Hector, MD;  Location: [facility] ENDOSCOPY;  Service: Cardiovascular;  Laterality: N/A;  . Abdominal hysterectomy  2000    left both ovaries in  . Tonsillectomy and adenoidectomy      pt was 50 years old    Family History  Problem Relation Age of Onset  . Breast cancer Mother   . Breast cancer Maternal Grandmother   . Cancer Maternal Grandfather 39    prostate  . Ovarian cancer Other   . Seizures Paternal Grandmother   .  Seizures Paternal Grandfather    Social History:  reports that she has been smoking Cigarettes.  She has been smoking about 0.25 packs per day. She has never used smokeless tobacco. She reports that she drinks about 1/5 of alcohol every two days. She reports that she does not use illicit drugs.  Allergies: No Known Allergies  Medications:  I have reviewed the patient's current medications. Prior to Admission:  Prescriptions prior to admission  Medication Sig Dispense Refill Last Dose  . anastrozole (ARIMIDEX) 1 MG tablet Take 1 tablet (1 mg total) by mouth every evening. 7 tablet 0 Taking  . citalopram (CELEXA) 10 MG tablet Take 1 tablet (10 mg total) by mouth daily. 90 tablet 2 11/06/2015 at Unknown time  . clonazePAM (KLONOPIN) 0.5 MG tablet Take 0.5 mg by mouth 2 (two) times daily as needed for anxiety.   prn at prn  . esomeprazole (NEXIUM) 20 MG capsule Take 20 mg by mouth daily as needed (for heartburn).    prn at prn  . folic acid (FOLVITE) 1 MG tablet Take 1 tablet (1 mg total) by mouth daily. 30 tablet 0 11/06/2015 at Unknown time  . Hypromellose (ARTIFICIAL TEARS OP) Apply 2 drops to eye 2 (two) times daily as needed (for dry eyes).   prn at prn  . polyethylene glycol (MIRALAX / GLYCOLAX) packet Take 17 g by mouth daily as needed.  prn at prn  . prochlorperazine (COMPAZINE) 5 MG tablet Take 5 mg by mouth every 6 (six) hours as needed for nausea or vomiting.   prn at prn  . traMADol (ULTRAM) 50 MG tablet Take 0.5 tablets (25 mg total) by mouth every 6 (six) hours as needed. 30 tablet 2 prn at prn  . vitamin B-12 500 MCG tablet Take 1 tablet (500 mcg total) by mouth daily. 30 tablet 0 11/06/2015 at Unknown time  . vitamin B-6 (PYRIDOXINE) 25 MG tablet Take 1 tablet (25 mg total) by mouth daily. 90 tablet 3 11/06/2015 at Unknown time  . warfarin (COUMADIN) 5 MG tablet Take 2.5 mg by mouth daily at 6 PM.   11/06/2015 at Unknown time   Scheduled: . anastrozole  1 mg Oral QPM  . artificial  tears   Both Eyes 3 times per day  . citalopram  10 mg Oral Daily  . folic acid  1 mg Oral Daily  . pantoprazole  40 mg Oral QAC breakfast  . vitamin B-6  25 mg Oral Daily  . vitamin B-12  500 mcg Oral Daily  . warfarin  10 mg Oral ONCE-1800  . Warfarin - Physician Dosing Inpatient   Does not apply q1800    ROS: History obtained from the patient  General ROS: negative for - chills, fatigue, fever, night sweats, weight gain or weight loss Psychological ROS: negative for - behavioral disorder, hallucinations, memory difficulties, mood swings or suicidal ideation Ophthalmic ROS: negative for - blurry vision, double vision, eye pain or loss of vision ENT ROS: oral lesions Allergy and Immunology ROS: negative for - hives or itchy/watery eyes Hematological and Lymphatic ROS: negative for - bleeding problems, bruising or swollen lymph nodes Endocrine ROS: negative for - galactorrhea, hair pattern changes, polydipsia/polyuria or temperature intolerance Respiratory ROS: negative for - cough, hemoptysis, shortness of breath or wheezing Cardiovascular ROS: negative for - chest pain, dyspnea on exertion, edema or irregular heartbeat Gastrointestinal ROS: negative for - abdominal pain, diarrhea, hematemesis, nausea/vomiting or stool incontinence Genito-Urinary ROS: negative for - dysuria, hematuria, incontinence or urinary frequency/urgency Musculoskeletal ROS: negative for - joint swelling or muscular weakness Neurological ROS: as noted in HPI Dermatological ROS: negative for rash and skin lesion changes  Physical Examination: Blood pressure 145/89, pulse 97, temperature 97.6 F (36.4 C), temperature source Oral, resp. rate 20, height '5\' 2"'  (1.575 m), weight 65.772 kg (145 lb), SpO2 97 %.  HEENT-  Normocephalic, no lesions, without obvious abnormality.  Normal external eye and conjunctiva.  Normal TM's bilaterally.  Normal auditory canals and external ears. Lesions around mouth.  Normal  pharynx. Cardiovascular- S1, S2 normal, pulses palpable throughout   Lungs- chest clear, no wheezing, rales, normal symmetric air entry Abdomen- soft, non-tender; bowel sounds normal; no masses,  no organomegaly Extremities- no edema Lymph-no adenopathy palpable Musculoskeletal-no joint tenderness, deformity or swelling Skin-warm and dry, no hyperpigmentation, vitiligo, or suspicious lesions  Neurological Examination Mental Status: Alert, oriented, thought content appropriate.  Speech fluent without evidence of aphasia.  Able to follow 3 step commands without difficulty. Cranial Nerves: II: Discs flat bilaterally; Visual fields grossly normal, pupils equal, round, reactive to light and accommodation III,IV, VI: ptosis not present, extra-ocular motions intact bilaterally V,VII: smile symmetric, facial light touch sensation normal bilaterally VIII: hearing normal bilaterally IX,X: gag reflex present XI: bilateral shoulder shrug XII: midline tongue extension Motor: 4+-5-/5 throughout with patient somewhat weaker on the left.  No drift noted.   Sensory: Pinprick and light  touch intact throughout, bilaterally Deep Tendon Reflexes: 2+ and symmetric throughout Plantars: Right: downgoing   Left: downgoing Cerebellar: Dysmetria with finger-to-nose and heel-to-shin testing bilaterally (left greater than right) Gait: not tested due to safety concerns   Laboratory Studies:  Basic Metabolic Panel:  Recent Labs Lab 11/04/15 1057 11/07/15 1325  NA 133* 134*  K 2.9* 2.9*  CL 96* 100*  CO2 24 20*  GLUCOSE 79 91  BUN 8 6  CREATININE 0.56 0.53  CALCIUM 8.0* 7.7*    Liver Function Tests:  Recent Labs Lab 11/04/15 1057 11/07/15 1325  AST 45* 57*  ALT 23 28  ALKPHOS 78 79  BILITOT 1.3* 1.3*  PROT 5.6* 5.5*  ALBUMIN 2.6* 2.5*   No results for input(s): LIPASE, AMYLASE in the last 168 hours. No results for input(s): AMMONIA in the last 168 hours.  CBC:  Recent Labs Lab  11/04/15 1057 11/07/15 1325  WBC 10.5 5.8  NEUTROABS  --  3.9  HGB 14.0 13.8  HCT 39.6 39.3  MCV 100.5* 101.7*  PLT 283 197    Cardiac Enzymes:  Recent Labs Lab 11/04/15 1057  CKTOTAL 225    BNP: Invalid input(s): POCBNP  CBG: No results for input(s): GLUCAP in the last 168 hours.  Microbiology: Results for orders placed or performed during the hospital encounter of 06/11/15  MRSA PCR Screening     Status: None   Collection Time: 06/11/15  4:20 PM  Result Value Ref Range Status   MRSA by PCR NEGATIVE NEGATIVE Final    Comment:        The GeneXpert MRSA Assay (FDA approved for NASAL specimens only), is one component of a comprehensive MRSA colonization surveillance program. It is not intended to diagnose MRSA infection nor to guide or monitor treatment for MRSA infections.     Coagulation Studies:  Recent Labs  11/07/15 1730  LABPROT 16.8*  INR 1.35    Urinalysis:  Recent Labs Lab 11/07/15 1819  COLORURINE AMBER*  LABSPEC 1.021  PHURINE 6.0  GLUCOSEU NEGATIVE  HGBUR 1+*  BILIRUBINUR NEGATIVE  KETONESUR 1+*  PROTEINUR NEGATIVE  NITRITE NEGATIVE  LEUKOCYTESUR 2+*    Lipid Panel:    Component Value Date/Time   CHOL 158 11/08/2015 0439   TRIG 73 11/08/2015 0439   HDL 70 11/08/2015 0439   CHOLHDL 2.3 11/08/2015 0439   VLDL 15 11/08/2015 0439   LDLCALC 73 11/08/2015 0439    HgbA1C:  Lab Results  Component Value Date   HGBA1C 5.4 06/22/2015    Urine Drug Screen:     Component Value Date/Time   LABOPIA NONE DETECTED 06/21/2015 1115   COCAINSCRNUR NONE DETECTED 06/21/2015 1115   LABBENZ NONE DETECTED 06/21/2015 1115   AMPHETMU NONE DETECTED 06/21/2015 1115   THCU NONE DETECTED 06/21/2015 1115   LABBARB NONE DETECTED 06/21/2015 1115    Alcohol Level: No results for input(s): ETH in the last 168 hours.  Other results: EKG: sinus tachycardia at 102 bpm.  Imaging: [name] Contrast  11/07/2015  CLINICAL DATA:  Weakness,  muscle wasting, involuntary muscle contractions over the past 3 weeks. EXAM: MRI HEAD WITHOUT AND WITH CONTRAST TECHNIQUE: Multiplanar, multiecho pulse sequences of the brain and surrounding structures were obtained without and with intravenous contrast. CONTRAST:  34m MULTIHANCE GADOBENATE DIMEGLUMINE 529 MG/ML IV SOLN COMPARISON:  Head CT 04/04/2016 and MRI 06/21/2015 FINDINGS: There is a small region of cortical restricted diffusion in the right parieto-occipital region consistent with acute or early subacute infarct  superimposed on an area of late subacute ischemia demonstrated on the prior MRI. There is a trace amount of associated petechial blood products. There are small areas of slightly increased trace diffusion signal involving cortex and subcortical white matter in the right frontal and parietal lobes without definite restricted diffusion on the ADC map, most consistent with older subacute infarcts and largely corresponding to the infarcts demonstrated on the prior MRI. No mass, midline shift, or extra-axial fluid collection is seen. Slight cerebral atrophy is unchanged. There is mild enhancement associated with some of the infarcts. Orbits are unremarkable. Paranasal sinuses and mastoid air cells are clear. Major intracranial vascular flow voids are preserved. IMPRESSION: 1. Small area of acute/early subacute infarct in the right parieto-occipital region. 2. Multiple late subacute infarcts in the right cerebral hemisphere as previously demonstrated. Electronically Signed   By: Logan Bores M.D.   On: 11/07/2015 16:55   Mr Cervical Spine W Wo Contrast  11/07/2015  CLINICAL DATA:  Weakness. Involuntary muscle contractions over the last 3 weeks. EXAM: MRI CERVICAL SPINE WITHOUT AND WITH CONTRAST TECHNIQUE: Multiplanar and multiecho pulse sequences of the cervical spine, to include the craniocervical junction and cervicothoracic junction, were obtained according to standard protocol without and with  intravenous contrast. CONTRAST:  43m MULTIHANCE GADOBENATE DIMEGLUMINE 529 MG/ML IV SOLN COMPARISON:  Neck CTA 09/16/2015 FINDINGS: Multiple sequences are mildly to moderately motion degraded. There is straightening of the normal cervical lordosis. There is trace retrolisthesis of C5 on C6 and C6 on C7, likely degenerative. Mild disc space narrowing and degenerative endplate spurring are present at C5-6 and C6-7. No significant vertebral marrow edema is seen. A left-sided atlanto-occipital joint effusion is present. No definite cervical spinal cord signal abnormality is identified, however evaluation is limited by motion artifact. No abnormal enhancement is seen. Paraspinal soft tissues are unremarkable. C2-3:  No disc herniation or stenosis. C3-4:  Minimal right uncovertebral spurring without stenosis. C4-5:  Minimal right uncovertebral spurring without stenosis. C5-6: Broad-based posterior disc osteophyte complex results in mild bilateral neural foraminal stenosis. There is flattening of the ventral thecal sac and a slight impression on the ventral spinal cord without significant spinal stenosis. C6-7: Disc bulging and uncovertebral spurring result in mild spinal stenosis with slight ventral cord flattening and mild left greater than right neural foraminal stenosis. C7-T1:  Negative. IMPRESSION: 1. Mildly to moderately motion degraded examination. 2. Mild spinal stenosis and mild bilateral neural foraminal stenosis at C6-7. 3. Mild bilateral foraminal stenosis at C5-6. Electronically Signed   By: ALogan BoresM.D.   On: 11/07/2015 17:02   UKoreaCarotid Bilateral  11/08/2015  CLINICAL DATA:  50year old female with an acute/subacute infarct in the right parietal occipital region. EXAM: BILATERAL CAROTID DUPLEX ULTRASOUND TECHNIQUE: GPearline Cablesscale imaging, color Doppler and duplex ultrasound were performed of bilateral carotid and vertebral arteries in the neck. COMPARISON:  Brain MRI 11/07/2015 FINDINGS: Criteria:  Quantification of carotid stenosis is based on velocity parameters that correlate the residual internal carotid diameter with NASCET-based stenosis levels, using the diameter of the distal internal carotid lumen as the denominator for stenosis measurement. The following velocity measurements were obtained: RIGHT ICA:  43/6 cm/sec CCA:  557/32cm/sec SYSTOLIC ICA/CCA RATIO:  0.8 DIASTOLIC ICA/CCA RATIO:  1.0 ECA:  60 cm/sec LEFT ICA:  58/22 cm/sec CCA:  620/25cm/sec SYSTOLIC ICA/CCA RATIO:  0.9 DIASTOLIC ICA/CCA RATIO:  1.0 ECA:  80 cm/sec RIGHT CAROTID ARTERY: Trace smooth hypoechoic atherosclerotic plaque in the proximal internal carotid artery. By peak  systolic velocity criteria, the estimated stenosis is less than 50%. RIGHT VERTEBRAL ARTERY:  Patent with antegrade flow LEFT CAROTID ARTERY: Focal heterogeneous atherosclerotic plaque in the proximal internal carotid artery. By peak systolic velocity criteria the estimated stenosis remains less than 50%. LEFT VERTEBRAL ARTERY:  Patent with normal antegrade flow. IMPRESSION: 1. Mild (1-49%) stenosis proximal right internal carotid artery secondary to trace smooth hypoechoic atherosclerotic plaque. 2. Mild (1-49%) stenosis proximal left internal carotid artery secondary to heterogenous atherosclerotic plaque. 3. Vertebral arteries are patent with normal antegrade flow. Signed, Criselda Peaches, MD [facility] Electronically Signed   By: Jacqulynn Cadet M.D.   On: 11/08/2015 10:27   Mr Jodene Nam Head/brain Wo Cm  11/08/2015  CLINICAL DATA:  Abnormal gait. Multiple falls. Weakness in both hands. Subacute infarctions involving the right parieto-occipital region and anterior right frontal lobe. EXAM: MRA HEAD WITHOUT CONTRAST TECHNIQUE: Angiographic images of the Circle of Willis were obtained using MRA technique without intravenous contrast. COMPARISON:  MRI brain 11/07/2015. FINDINGS: The internal carotid  arteries are within normal limits from the high cervical segments through the ICA termini bilaterally. The anterior communicating artery is patent. 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 The left A1 segment is intact. M1 segments are normal bilaterally. The MCA bifurcations are intact. There is moderate attenuation of distal MCA branch vessels bilaterally. The left vertebral artery is slightly dominant to the right. The left PICA origin is visualized and normal. The right AICA is dominant. The basilar artery is normal. The right posterior cerebral artery is of fetal type. There is some attenuation of distal PCA branch vessels bilaterally. IMPRESSION: 1. No significant proximal stenosis, aneurysm, or branch vessel occlusion. 2. 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 3. Mild-to-moderate distal branch vessel narrowing in the ACA and PCA distributions bilaterally. Electronically Signed   By: San Morelle M.D.   On: 11/08/2015 10:10    Assessment: 50 y.o. female admitted with recurrent infarct.  Patient with bilateral deficits.  MRI of the brain personally reviewed and shows multiple small early to late subacute infarcts in the right hemisphere.  This does not really explain her bilateral neurological examination.  MRI of the cervical spine unremarkable.  Concern is for a more generalized underlying etiology.  Patient with alcoholism drinking excessive amounts daily.  B12 and folate have been low in the past.  TSH normal in the past.  Hypercoagulable panel normal in the past.  Also with evidence on examination that may suggest a postinfectious etiology.  Carotid dopplers show no hemodynamically significant stenosis.    Stroke Risk Factors - hypertension and smoking  Plan: 1. B12, folate, thiamine, ESR, CRP, Copper, Vitamin E, Vitamin D, Zinc, heavy metal screen 2. PT consult, OT consult, Speech consult 3. Smoking and alcohol cessation discussed at length 4.  Continue Coumadin with target INR between 2 and 3.  No bolus or bridging recommended.  5. Telemetry monitoring 6. Frequent neuro checks 7. Nicotine patch 8. CIWA protocol   Alexis Goodell, MD Neurology [phone] 11/08/2015, 12:05 PM

## 2015-11-08 NOTE — Progress Notes (Addendum)
OT Cancellation Note  Patient Details Name: Tina Sharp MRN: [id number] DOB: 1966-06-25   Cancelled Treatment:    Reason Eval/Treat Not Completed: Patient at procedure or test/ unavailable when attempted this a.m.  [name], MS, OTR/L  [name] 11/08/2015, 9:26 AM

## 2015-11-08 NOTE — Progress Notes (Signed)
ANTICOAGULATION CONSULT NOTE - Initial Consult  Pharmacy Consult for Warfarin Indication: stroke  No Known Allergies  Patient Measurements: Height: 5\' 2"  (157.5 cm) Weight: 145 lb (65.772 kg) IBW/kg (Calculated) : 50.1   Vital Signs: Temp: 98.5 F (36.9 C) (03/28 1331) Temp Source: Oral (03/28 1331) BP: 158/87 mmHg (03/28 1331) Pulse Rate: 102 (03/28 1331)  Labs:  Recent Labs  11/07/15 1325 11/07/15 1730 11/08/15 1238  HGB 13.8  --   --   HCT 39.3  --   --   PLT 197  --   --   APTT  --  28  --   LABPROT  --  16.8* 15.5*  INR  --  1.35 1.21  CREATININE 0.53  --   --     Estimated Creatinine Clearance: 73.2 mL/min (by C-G formula based on Cr of 0.53).    Assessment: 50 yo female admitted with recurrent stroke and subtherapeutic INR. Spoke with pt on 3/28, pt states she takes half a 5 mg tablet (=2.5 mg) daily, last dose night before she came in (=3/26).   3/27 INR 1.35, Hgb 13.8, Plt 197 -  Appears no warfarin was given 3/28 INR 1.21   Goal of Therapy:  INR 2-3 Monitor platelets by anticoagulation protocol: Yes   Plan:  Will give higher dose of warfarin 5 mg tonight  Will follow up INR in AM and adjust accordingly - Daily INR Counseled pt on warfarin 11/08/15.  Pharmacy will continue to follow.    Rocky Morel 11/08/2015,3:06 PM

## 2015-11-08 NOTE — Evaluation (Signed)
Physical Therapy Evaluation Patient Details Name: Tina Sharp MRN: [id number] DOB: [birth year] Today's Date: 11/08/2015   History of Present Illness  Pt. is a 50 y.o. F admitted to hospital for R CVA (parieto-occipital and cerebral). Pt. has hx of cancer, HTN, depression, anxiety, and multiple strokes. Most recent stroke prior to present was 08/18/15. Pt has hx of falls. Recent B LE weakness has led to falls and difficulty w/ambulation. Pt had to stop working 09/14/15 due to weakness/pain in hands.   Clinical Impression  Korrine Perl is a pleasant 50 y.o. admitted to the hospital for R CVA in parieto-occipital lobe and cerebrum. Pt able to perform bed mobility to EOB with 2+ max assist with assistance with trunk and B UE. Pt able to perform sit to stand transfer to RW with 2+ max assist x2. Pt demonstrates poor trunk control and weakness in B UE and LE. Pt. demonstrates decreased sensation in fingers on B hands. Pt stated hypersensitivity in B LE. Pt. also stated she has noticed deficits with her vision while watching tv recently. Pt motivated to perform PT. Pt demonstrates deficits in strength, balance, mobility, and sensation limiting functional mobility. Pt would benefit from skilled therapy after acute hospitalization. Recommend pt be sent to SNF upon discharge from acute setting.     Follow Up Recommendations SNF    Equipment Recommendations  Rolling walker with 5" wheels    Recommendations for Other Services       Precautions / Restrictions Precautions Precautions: Fall Restrictions Weight Bearing Restrictions: No      Mobility  Bed Mobility Overal bed mobility: +2 for physical assistance             General bed mobility comments: Pt. able to move B LE to EOB. Pt demonstrated poor trunk control and inablility to hold onto railing with hands. Pt needed support keeping trunk upright and with B UE. Max assist +2  Transfers Overall transfer level: Needs assistance Equipment  used: Rolling walker (2 wheeled) Transfers: Sit to/from Stand Sit to Stand: Max assist;+2 physical assistance         General transfer comment: Pt. needed 2+ max assist to perform sit to stand on EOB. Pt instructed to place B hands on walker due to poor grip strength. Once up, pt needed cues to raise head and trunk. Pt needed knees blocked to keep from buckling. Pt able to peform sit to stand x2.   Ambulation/Gait             General Gait Details: Due to safety concerns and poor balance pt unable to ambulate at this time.   Stairs            Wheelchair Mobility    Modified Rankin (Stroke Patients Only)       Balance Overall balance assessment: Needs assistance Sitting-balance support: Bilateral upper extremity supported     Postural control: Other (comment) (Poor trunk control ) Standing balance support: Bilateral upper extremity supported (poor standing balance, required 2+ assist)                                 Pertinent Vitals/Pain Pain Assessment: 0-10 Pain Score: 6  Pain Location: B LE Pain Descriptors / Indicators: Aching;Constant Pain Intervention(s): Limited activity within patient's tolerance    Home Living Family/patient expects to be discharged to:: Private residence Living Arrangements: Spouse/significant other;Children Available Help at Discharge: Family Type of Home: House Home Access:  Stairs to enter Entrance Stairs-Rails: None Entrance Stairs-Number of Steps: 2 Home Layout: One level Home Equipment: Walker - 4 wheels Additional Comments: Pt. lives at home with spouse and daughter. Both family members work/go to school and are not available during the day.     Prior Function Level of Independence: Independent         Comments: Pt had to stop working in February due to inablity to type.     Hand Dominance        Extremity/Trunk Assessment   Upper Extremity Assessment: Generalized weakness;RUE deficits/detail;LUE  deficits/detail RUE Deficits / Details: decreased grip strength, 4-/5 elbow flex/ext, decreased shoulder flex ROM   RUE Sensation: decreased light touch (fingers ) LUE Deficits / Details: decreased grip strength, 4-/5 elbow flex/ext, decreased shoulder flex ROM    Lower Extremity Assessment: Generalized weakness;RLE deficits/detail;LLE deficits/detail RLE Deficits / Details: knee flex/ext 4/5 LLE Deficits / Details: knee flex/ext 3/5     Communication   Communication: Other (comment);No difficulties (muted speech )  Cognition Arousal/Alertness: Awake/alert Behavior During Therapy: Anxious Overall Cognitive Status: Within Functional Limits for tasks assessed                      General Comments      Exercises Other Exercises Other Exercises: Pt. performed 2x sit to stand at EOB with 2+ max assist. Pt able to stand for approx. 10-15 sec each trial. Pt able to perfrom sitting EOB righting activities to improve balance and achieve midline posture. Pt needed max assist for seated balance initially and progressed to CGA.       Assessment/Plan    PT Assessment Patient needs continued PT services  PT Diagnosis Difficulty walking;Generalized weakness   PT Problem List Decreased strength;Decreased range of motion;Decreased activity tolerance;Decreased balance;Decreased mobility;Decreased coordination;Impaired sensation;Pain  PT Treatment Interventions DME instruction;Gait training;Stair training;Therapeutic activities;Therapeutic exercise;Balance training;Neuromuscular re-education;Patient/family education   PT Goals (Current goals can be found in the Care Plan section) Acute Rehab PT Goals Patient Stated Goal: Return to being independent with ADLs PT Goal Formulation: With patient Time For Goal Achievement: 11/22/15 Potential to Achieve Goals: Fair    Frequency 7X/week   Barriers to discharge Inaccessible home environment;Decreased caregiver support Family not available  during the day. Home bathroom not accessible with walker.     Co-evaluation               End of Session Equipment Utilized During Treatment: Gait belt Activity Tolerance: Patient limited by fatigue Patient left: in bed;Other (comment);with family/visitor present (MD in room) Nurse Communication: Mobility status         Time: [id number] PT Time Calculation (min) (ACUTE ONLY): 28 min   Charges:         PT G Codes:        Sherral Hammers November 26, 2015, 3:46 PM M. Barnett Abu, SPT

## 2015-11-08 NOTE — Evaluation (Signed)
Occupational Therapy Evaluation Patient Details Name: Tina Sharp MRN: [id number] DOB: [birth year] Today's Date: 11/08/2015    History of Present Illness Pt. is a 50 y.o. F admitted to hospital for R CVA (parieto-occipital and cerebral). Pt. has hx of cancer, HTN, depression, anxiety, and multiple strokes. Most recent stroke prior to present was 08/18/15. Pt has hx of falls. Recent B LE weakness has led to falls and difficulty w/ambulation. Pt had to stop working 09/14/15 due to weakness/pain in hands.    Clinical Impression   Pt is 50 year old woman who presents with blurry vision (full assessment not yet completed), decreased coordination with gross and fine motor skills of BUEs and hands and pain 6/10 on the inside of palm and fingers (ventral).  She has decreased opposition with increased extension in fingers with weakness in grasp and UEs and AROM limited to 90 degrees bilaterally.  She was not able to feed self but with built up foam handles, she was able to scoop ice cream and bring to mouth x2.  She has very chapped and sore lips and poor appetite so patient did not want to eat more than 2 spoonfuls and stated her arms were tired.  She has 4+/5 strength in RUE and hand and 4-/5 in LUE and hand.  She has impaired finger to nose test bilaterally with dysmetria worse on left than right.  Proprioception and light touch are impaired in BUEs as well.  She requires mod assist to complete self feeding, grooming and UB dressing and total assist for LB dressing skills due to decreased trunk control, pain in shoulders 7/10 and decreased endurance and tolerance for functional activities.  She would benefit from skilled OT services to address ADL training, fine motor skills training, adaptive equipment training, strengthening, and family ed and training.  She would benefit from SNF for continued rehab after discharge from hospital.    Follow Up Recommendations  SNF    Equipment Recommendations  Tub/shower seat (built up utensils, dycem)    Recommendations for Other Services       Precautions / Restrictions Precautions Precautions: Fall Restrictions Weight Bearing Restrictions: No      Mobility Bed Mobility                  Transfers                      Balance                                            ADL Overall ADL's : Needs assistance/impaired Eating/Feeding: Maximal assistance Eating/Feeding Details (indicate cue type and reason): poor control and grasp of utensils but able to use foam utensil holder to feed self ice cream with spoon but fatigued after trying 2x and mouth hurt from sores all over lips from being chapped; given dycem to help hold plate and bowl during self feeding Grooming: Wash/dry face;Brushing hair;Moderate assistance;Sitting Grooming Details (indicate cue type and reason): decreased control and quick ballistic movement of LUE when bringing hand to head to brush hair             Lower Body Dressing: Total assistance;Bed level Lower Body Dressing Details (indicate cue type and reason): pt unable to tolerate sitting EOB again since she tried with PT and she was not able to transfer to chair with PT.  Total assistance needed for LB dressing and bathing skills.               General ADL Comments: Pt needs max to total assist for ADLs due to weakness and decreased coordination with athetoid like movements in BUEs and hands when trying to bring hand to mouth or head for ADLs.  She was able to feed self with built up foam handle for 2 spoonfuls with extra time and then fatigued after 2 trials and stated her mouth hurt from chapped lips and poor appetite.     Vision Vision Assessment?: Vision impaired- to be further tested in functional context   Perception     Praxis      Pertinent Vitals/Pain Pain Assessment: 0-10 Pain Score: 6  Pain Location: R hand ventral aspect Pain Descriptors / Indicators:  Aching;Burning;Constant Pain Intervention(s): Limited activity within patient's tolerance;Monitored during session;Premedicated before session     Hand Dominance Right   Extremity/Trunk Assessment Upper Extremity Assessment Upper Extremity Assessment: Generalized weakness;RUE deficits/detail;LUE deficits/detail RUE Deficits / Details: decreased grip strength, 4-/5 elbow flex/ext, decreased shoulder flex ROM to 90 degrees, minimal shoulder rotation RUE Sensation: decreased light touch RUE Coordination: decreased fine motor;decreased gross motor (impaired proprioception) LUE Deficits / Details: decreased grip strength, 4-/5 elbow flex/ext, decreased shoulder flex ROM to 90 degrees, no shoulder rotation LUE Sensation: decreased light touch LUE Coordination: decreased fine motor;decreased gross motor (impaired proprioception)           Communication Communication Communication: No difficulties   Cognition Arousal/Alertness: Awake/alert Behavior During Therapy: Anxious Overall Cognitive Status: Within Functional Limits for tasks assessed                     General Comments       Exercises       Shoulder Instructions      Home Living Family/patient expects to be discharged to:: Private residence Living Arrangements: Spouse/significant other;Children Available Help at Discharge: Family Type of Home: House Home Access: Stairs to enter Technical brewer of Steps: 2 Entrance Stairs-Rails: None Home Layout: One level     Bathroom Shower/Tub: Tub only   Biochemist, clinical: Standard Bathroom Accessibility: No   Home Equipment: Environmental consultant - 4 wheels   Additional Comments: Pt. lives at home with spouse and daughter. Both family members work/go to school and are not available during the day.       Prior Functioning/Environment Level of Independence: Independent        Comments: Pt had to stop working in February due to inablity to type.    OT Diagnosis:  Generalized weakness;Acute pain (decreased coordination BUE and hands with athetoid like movements with poor control amd decrased proprioception)   OT Problem List: Decreased strength;Decreased range of motion;Decreased activity tolerance;Impaired sensation;Decreased coordination;Pain   OT Treatment/Interventions: Self-care/ADL training;DME and/or AE instruction;Therapeutic activities;Patient/family education;Balance training    OT Goals(Current goals can be found in the care plan section) Acute Rehab OT Goals Patient Stated Goal: Return to being independent with ADLs OT Goal Formulation: With patient/family Time For Goal Achievement: 11/22/15 Potential to Achieve Goals: Good  OT Frequency: Min 1X/week   Barriers to D/C:    husband works and daughters are in school       Co-evaluation              End of Session    Activity Tolerance: Patient limited by fatigue Patient left: in bed;with call bell/phone within reach;with bed alarm set;with family/visitor present (daughter Otho Ket in  room with her and then RN at end of session )   Time: 1530-1615 OT Time Calculation (min): 45 min Charges:  OT General Charges $OT Visit: 1 Procedure OT Evaluation $OT Eval Moderate Complexity: 1 Procedure OT Treatments $Self Care/Home Management : 23-37 mins G-Codes:     Chrys Racer, OTR/L ascom [phone] 11/08/2015, 4:30 PM

## 2015-11-08 NOTE — Clinical Social Work Placement (Signed)
   CLINICAL SOCIAL WORK PLACEMENT  NOTE  Date:  11/08/2015  Patient Details  Name: Tina Sharp MRN: [id number] Date of Birth: 08-27-1965  Clinical Social Work is seeking post-discharge placement for this patient at the [place] level of care (*CSW will initial, date and re-position this form in  chart as items are completed):  Yes   Patient/family provided with [place] Work Department's list of facilities offering this level of care within the geographic area requested by the patient (or if unable, by the patient's family).  Yes   Patient/family informed of their freedom to choose among providers that offer the needed level of care, that participate in Medicare, Medicaid or managed care program needed by the patient, have an available bed and are willing to accept the patient.  Yes   Patient/family informed of [facility]'s ownership interest in [facility] and [facility], as well as of the fact that they are under no obligation to receive care at these facilities.  PASRR submitted to EDS on       PASRR number received on       Existing PASRR number confirmed on       FL2 transmitted to all facilities in geographic area requested by pt/family on 11/08/15     FL2 transmitted to all facilities within larger geographic area on       Patient informed that his/her managed care company has contracts with or will negotiate with certain facilities, including the following:            Patient/family informed of bed offers received.  Patient chooses bed at       Physician recommends and patient chooses bed at      Patient to be transferred to   on  .  Patient to be transferred to facility by       Patient family notified on   of transfer.  Name of family member notified:        PHYSICIAN       Additional Comment:    _______________________________________________ Darden Dates, LCSW 11/08/2015, 4:16 PM

## 2015-11-09 ENCOUNTER — Encounter: Payer: Managed Care, Other (non HMO) | Admitting: Diagnostic Neuroimaging

## 2015-11-09 LAB — PROTIME-INR
INR: 1.27
PROTHROMBIN TIME: 16 s — AB (ref 11.4–15.0)

## 2015-11-09 LAB — BASIC METABOLIC PANEL
ANION GAP: 7 (ref 5–15)
BUN: 8 mg/dL (ref 6–20)
CALCIUM: 7.9 mg/dL — AB (ref 8.9–10.3)
CHLORIDE: 98 mmol/L — AB (ref 101–111)
CO2: 28 mmol/L (ref 22–32)
CREATININE: 0.46 mg/dL (ref 0.44–1.00)
GFR calc Af Amer: >60 mL/min (ref >60)
GFR calc non Af Amer: >60 mL/min (ref >60)
GLUCOSE: 93 mg/dL (ref 65–99)
Potassium: 3.7 mmol/L (ref 3.5–5.1)
Sodium: 133 mmol/L — ABNORMAL LOW (ref 135–145)

## 2015-11-09 MED ORDER — METOPROLOL TARTRATE 50 MG PO TABS: 50.0000 mg | ORAL_TABLET | Freq: Two times a day (BID) | ORAL | Status: DC

## 2015-11-09 MED ORDER — GABAPENTIN 100 MG PO CAPS: 100.0000 mg | ORAL_CAPSULE | Freq: Two times a day (BID) | ORAL | Status: DC

## 2015-11-09 MED ORDER — CYANOCOBALAMIN 1000 MCG PO TABS: 1000.0000 ug | ORAL_TABLET | Freq: Every day | ORAL | Status: AC

## 2015-11-09 MED ORDER — AMLODIPINE BESYLATE 5 MG PO TABS: 5.0000 mg | ORAL_TABLET | Freq: Every day | ORAL | Status: DC

## 2015-11-09 MED ORDER — WARFARIN SODIUM 3 MG PO TABS
3.0000 mg | ORAL_TABLET | Freq: Every day | ORAL | Status: DC
  Administered 2015-11-09: 3 mg via ORAL
  Filled 2015-11-09 (×2): qty 1

## 2015-11-09 MED ORDER — ATORVASTATIN CALCIUM 40 MG PO TABS: 40.0000 mg | ORAL_TABLET | Freq: Every day | ORAL | Status: AC

## 2015-11-09 MED ORDER — CITALOPRAM HYDROBROMIDE 20 MG PO TABS
20.0000 mg | ORAL_TABLET | Freq: Every day | ORAL | Status: DC
  Administered 2015-11-10: 09:00:00 20 mg via ORAL
  Filled 2015-11-09: qty 1

## 2015-11-09 MED ORDER — CLONAZEPAM 0.5 MG PO TABS: 0.5000 mg | ORAL_TABLET | Freq: Two times a day (BID) | ORAL | Status: DC | PRN

## 2015-11-09 MED ORDER — TRAMADOL HCL 50 MG PO TABS
50.0000 mg | ORAL_TABLET | Freq: Four times a day (QID) | ORAL | Status: DC | PRN
  Administered 2015-11-09 (×2): 50 mg via ORAL
  Filled 2015-11-09: qty 1

## 2015-11-09 MED ORDER — WARFARIN SODIUM 3 MG PO TABS: 3.0000 mg | ORAL_TABLET | Freq: Every day | ORAL | Status: DC

## 2015-11-09 MED ORDER — CITALOPRAM HYDROBROMIDE 20 MG PO TABS: 20.0000 mg | ORAL_TABLET | Freq: Every day | ORAL | Status: DC

## 2015-11-09 MED ORDER — BENZOCAINE 10 % MT GEL: Freq: Three times a day (TID) | OROMUCOSAL | Status: DC | PRN

## 2015-11-09 MED ORDER — TRAMADOL HCL 50 MG PO TABS
50.0000 mg | ORAL_TABLET | Freq: Four times a day (QID) | ORAL | Status: DC | PRN
Start: 2015-11-09 — End: 2016-07-30

## 2015-11-09 MED ORDER — ENSURE ENLIVE PO LIQD
237.0000 mL | Freq: Two times a day (BID) | ORAL | Status: DC
  Administered 2015-11-09 – 2015-11-10 (×3): 237 mL via ORAL

## 2015-11-09 MED ORDER — B COMPLEX PO TABS: 1.0000 | ORAL_TABLET | Freq: Every day | ORAL | Status: DC

## 2015-11-09 MED ORDER — TRAMADOL HCL 50 MG PO TABS: 50.0000 mg | ORAL_TABLET | Freq: Four times a day (QID) | ORAL | Status: DC | PRN

## 2015-11-09 MED ORDER — NICOTINE 21 MG/24HR TD PT24: 21.0000 mg | MEDICATED_PATCH | Freq: Every day | TRANSDERMAL | Status: DC

## 2015-11-09 NOTE — Clinical Social Work Placement (Signed)
   CLINICAL SOCIAL WORK PLACEMENT  NOTE  Date:  11/09/2015  Patient Details  Name: Tina Sharp MRN: [id number] Date of Birth: [birth year]  Clinical Social Work is seeking post-discharge placement for this patient at the [place] level of care (*CSW will initial, date and re-position this form in  chart as items are completed):  Yes   Patient/family provided with [place] Work Department's list of facilities offering this level of care within the geographic area requested by the patient (or if unable, by the patient's family).  Yes   Patient/family informed of their freedom to choose among providers that offer the needed level of care, that participate in Medicare, Medicaid or managed care program needed by the patient, have an available bed and are willing to accept the patient.  Yes   Patient/family informed of [place]'s ownership interest in [facility] and [facility], as well as of the fact that they are under no obligation to receive care at these facilities.  PASRR submitted to EDS on 11/09/15     PASRR number received on 11/09/15     Existing PASRR number confirmed on       FL2 transmitted to all facilities in geographic area requested by pt/family on 11/08/15     FL2 transmitted to all facilities within larger geographic area on       Patient informed that his/her managed care company has contracts with or will negotiate with certain facilities, including the following:        Yes   Patient/family informed of bed offers received.  Patient [facility] bed [facility]     Physician recommends and patient chooses bed at  [facility])    Patient to be transferred to   on  .  Patient to be transferred to facility by       Patient family notified on   of transfer.  Name of family member notified:        PHYSICIAN       Additional Comment:    _______________________________________________ Darden Dates,  LCSW 11/09/2015, 11:41 AM

## 2015-11-09 NOTE — Discharge Summary (Addendum)
[place] [facility] [place] NAME: Tina Sharp    MR#:  [id number]  DATE OF BIRTH:  06/02/1966  DATE OF ADMISSION:  11/07/2015 ADMITTING PHYSICIAN: Dustin Flock, MD  DATE OF DISCHARGE: 11/10/15  PRIMARY CARE PHYSICIAN: Chriss Czar, MD    ADMISSION DIAGNOSIS:  Muscle wasting [M62.50] Weakness [R53.1] CVA (cerebral infarction) [I63.9] Cerebral infarction due to unspecified mechanism [I63.9]  DISCHARGE DIAGNOSIS:  Active Problems:   CVA (cerebral infarction)   SECONDARY DIAGNOSIS:   Past Medical History  Diagnosis Date  . GERD (gastroesophageal reflux disease)   . Cancer [facility]) June 2015    Invasive lobular carcinoma, 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 ER/ PR+; Her 2 neu not overexpressed, microscopic positive margin (skin).  . Hypertension   . Depression   . Anxiety   . 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 Hypercoagulable workup came back negative. -She had TTE and TEE which were unremarkable and also loop recorder placed at the time. -She has been on Coumadin since November 2016. -Prior to admission patient also has been drinking heavily in the. Also has known B12 deficiency is but hasn't been taking her supplements.Normal B12 levels now - Appreciate neurology consult -Adjust coumadin dose so it can be therapeutic between 2-3 range. No bridging needed - statin added, though LDL within normal limits - MRI with new acute right parieto occipital infarcts- pt continues to smoke - Physical therapy  consult, occupational therapy consults requested. will need rehabilitation this time. - Appreciate speech therapy consult- puree diet with thin liquids recommended  #2 Chelitis and paresthesias- B12 level is normal. Continue B complex supplements -Orajel for pain and also Neurontin added.  #3 Tobacco abuse and also alcohol abuse- nicotine patch and CIWA protocol Counseled  #4 hypokalemia-replaced.  #5 HTN- added metoprolol and norvasc  #6 Depression and anxiety- increased celexa dose and also on klonopin  #7 Low Vitamin D- being replaced   Discharge to SNF   DISCHARGE CONDITIONS:   Stable  CONSULTS OBTAINED:  Treatment Team:  Alexis Goodell, MD  DRUG ALLERGIES:  No Known Allergies  DISCHARGE MEDICATIONS:   Current Discharge Medication List    START taking these medications   Details  amLODipine (NORVASC) 5 MG tablet Take 1 tablet (5 mg total) by mouth daily. Qty: 30 tablet, Refills: 2    atorvastatin (LIPITOR) 40 MG tablet Take 1 tablet (40 mg total) by mouth daily at 6 PM. Qty: 30 tablet, Refills: 2    b complex vitamins tablet Take 1 tablet by mouth daily. Qty: 30 tablet, Refills: 0    benzocaine (ORAJEL) 10 % mucosal gel Use as directed in the mouth or throat 3 (three) times daily as needed for mouth pain (to lip corners). Qty: 5.3 g, Refills: 0    feeding supplement, ENSURE ENLIVE, (ENSURE ENLIVE) LIQD Take 237 mLs by mouth 2 (two) times daily between meals. Qty: 237 mL, Refills: 12    gabapentin (NEURONTIN) 100 MG capsule Take 1 capsule (100 mg total) by mouth 2 (two) times daily. Qty: 60 capsule, Refills: 2  metoprolol tartrate (LOPRESSOR) 50 MG tablet Take 1 tablet (50 mg total) by mouth 2 (two) times daily. Qty: 60 tablet, Refills: 2    nicotine (NICODERM CQ - DOSED IN MG/24 HOURS) 21 mg/24hr patch Place 1 patch (21 mg total) onto the skin daily. Qty: 28 patch, Refills: 0    Vitamin D, Ergocalciferol, (DRISDOL) 50000 units CAPS capsule Take 1  capsule (50,000 Units total) by mouth every 7 (seven) days. Qty: 4 capsule, Refills: 0      CONTINUE these medications which have CHANGED   Details  citalopram (CELEXA) 20 MG tablet Take 1 tablet (20 mg total) by mouth daily. Qty: 30 tablet, Refills: 2    clonazePAM (KLONOPIN) 0.5 MG tablet Take 1 tablet (0.5 mg total) by mouth 2 (two) times daily as needed for anxiety. Qty: 20 tablet, Refills: 0    cyanocobalamin 1000 MCG tablet Take 1 tablet (1,000 mcg total) by mouth daily. Qty: 30 tablet, Refills: 2    traMADol (ULTRAM) 50 MG tablet Take 1 tablet (50 mg total) by mouth every 6 (six) hours as needed for moderate pain. Qty: 20 tablet, Refills: 0    warfarin (COUMADIN) 3 MG tablet Take 1 tablet (3 mg total) by mouth daily at 6 PM. Qty: 30 tablet, Refills: 2      CONTINUE these medications which have NOT CHANGED   Details  anastrozole (ARIMIDEX) 1 MG tablet Take 1 tablet (1 mg total) by mouth every evening. Qty: 7 tablet, Refills: 0    esomeprazole (NEXIUM) 20 MG capsule Take 20 mg by mouth daily as needed (for heartburn).     folic acid (FOLVITE) 1 MG tablet Take 1 tablet (1 mg total) by mouth daily. Qty: 30 tablet, Refills: 0    Hypromellose (ARTIFICIAL TEARS OP) Apply 2 drops to eye 2 (two) times daily as needed (for dry eyes).    polyethylene glycol (MIRALAX / GLYCOLAX) packet Take 17 g by mouth daily as needed.    prochlorperazine (COMPAZINE) 5 MG tablet Take 5 mg by mouth every 6 (six) hours as needed for nausea or vomiting.    vitamin B-6 (PYRIDOXINE) 25 MG tablet Take 1 tablet (25 mg total) by mouth daily. 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 PCP f/u in 1-2 weeks 2. Neurology f/u in 2-3 weeks 3. Will need B vitamin supplements  If you experience worsening of your admission symptoms, develop shortness of breath, life threatening emergency, suicidal or homicidal thoughts you must  seek medical attention immediately by calling 911 or calling your MD immediately  if symptoms less severe.  You Must read complete instructions/literature along with all the possible adverse reactions/side effects for all the Medicines you take and that have been prescribed to you. Take any new Medicines after you have completely understood and accept all the possible adverse reactions/side effects.   Please note  You were cared for by a hospitalist during your hospital stay. If you have any questions about your discharge medications or the care you received while you were in the hospital after you are discharged, you can call the unit and asked to speak with the hospitalist on call if the hospitalist that took care of you is not available. Once you are discharged, your primary care physician will handle any further medical issues. Please note that NO REFILLS for any discharge medications will be authorized once you are discharged, as it is  imperative that you return to your primary care physician (or establish a relationship with a primary care physician if you do not have one) for your aftercare needs so that they can reassess your need for medications and monitor your lab values.    Today   CHIEF COMPLAINT:   Chief Complaint  Patient presents with  . Extremity Weakness    VITAL SIGNS:  Blood pressure 134/83, pulse 95, temperature 98 F (36.7 C), temperature source Oral, resp. rate 18, height 5\' 2"  (1.575 m), weight 65.772 kg (145 lb), SpO2 98 %.  I/O:    Intake/Output Summary (Last 24 hours) at 11/10/15 0904 Last data filed at 11/10/15 0730  Gross per 24 hour  Intake    480 ml  Output    550 ml  Net    -70 ml    PHYSICAL EXAMINATION:   Physical Exam   GENERAL: 50 y.o.-year-old patient lying in the bed with no acute distress.  EYES: Pupils equal, round, reactive to light and accommodation. No scleral icterus. Extraocular muscles intact.  HEENT: Head atraumatic,  normocephalic. Oropharynx and nasopharynx clear. Red beefy tongue with lost papillae, chelitis at the corners of the mouth noted- chelitis improving since admission. NECK: Supple, no jugular venous distention. No thyroid enlargement, no tenderness.  LUNGS: Normal breath sounds bilaterally, no wheezing, rales,rhonchi or crepitation. No use of accessory muscles of respiration.  CARDIOVASCULAR: S1, S2 normal. No murmurs, rubs, or gallops.  ABDOMEN: Soft, nontender, nondistended. Bowel sounds present. No organomegaly or mass.  EXTREMITIES: No pedal edema, cyanosis, or clubbing.  NEUROLOGIC: Cranial nerves II through XII are intact. Muscle strength 3/5 in both lower extremities and 3/5 LUE and 4/5 RUE. Sensation intact. Gait not checked.  PSYCHIATRIC: The patient is alert and oriented x 3.  SKIN: No obvious rash, lesion, or ulcer.   DATA REVIEW:   CBC  Recent Labs Lab 11/07/15 1325  WBC 5.8  HGB 13.8  HCT 39.3  PLT 197    Chemistries   Recent Labs Lab 11/07/15 1325  11/09/15 0419  NA 134*  < > 133*  K 2.9*  < > 3.7  CL 100*  < > 98*  CO2 20*  < > 28  GLUCOSE 91  < > 93  BUN 6  < > 8  CREATININE 0.53  < > 0.46  CALCIUM 7.7*  < > 7.9*  AST 57*  --   --   ALT 28  --   --   ALKPHOS 79  --   --   BILITOT 1.3*  --   --   < > = values in this interval not displayed.  Cardiac Enzymes No results for input(s): TROPONINI in the last 168 hours.  Microbiology Results  Results for orders placed or performed during the hospital encounter of 06/11/15  MRSA PCR Screening     Status: None   Collection Time: 06/11/15  4:20 PM  Result Value Ref Range Status   MRSA by PCR NEGATIVE NEGATIVE Final    Comment:        The GeneXpert MRSA Assay (FDA approved for NASAL specimens only), is one component of a comprehensive MRSA colonization surveillance program. It is not intended to diagnose MRSA infection nor to guide or monitor treatment for MRSA infections.     RADIOLOGY:   Mr Kizzie Fantasia Contrast  11/07/2015  CLINICAL DATA:  Weakness, muscle wasting, involuntary muscle contractions over the past 3 weeks. EXAM: MRI HEAD WITHOUT AND WITH CONTRAST TECHNIQUE:  Multiplanar, multiecho pulse sequences of the brain and surrounding structures were obtained without and with intravenous contrast. CONTRAST:  27mL MULTIHANCE GADOBENATE DIMEGLUMINE 529 MG/ML IV SOLN COMPARISON:  Head CT 04/04/2016 and MRI 06/21/2015 FINDINGS: There is a small region of cortical restricted diffusion in the right parieto-occipital region consistent with acute or early subacute infarct superimposed on an area of late subacute ischemia demonstrated on the prior MRI. There is a trace amount of associated petechial blood products. There are small areas of slightly increased trace diffusion signal involving cortex and subcortical white matter in the right frontal and parietal lobes without definite restricted diffusion on the ADC map, most consistent with older subacute infarcts and largely corresponding to the infarcts demonstrated on the prior MRI. No mass, midline shift, or extra-axial fluid collection is seen. Slight cerebral atrophy is unchanged. There is mild enhancement associated with some of the infarcts. Orbits are unremarkable. Paranasal sinuses and mastoid air cells are clear. Major intracranial vascular flow voids are preserved. IMPRESSION: 1. Small area of acute/early subacute infarct in the right parieto-occipital region. 2. Multiple late subacute infarcts in the right cerebral hemisphere as previously demonstrated. Electronically Signed   By: Logan Bores M.D.   On: 11/07/2015 16:55   Mr Cervical Spine W Wo Contrast  11/07/2015  CLINICAL DATA:  Weakness. Involuntary muscle contractions over the last 3 weeks. EXAM: MRI CERVICAL SPINE WITHOUT AND WITH CONTRAST TECHNIQUE: Multiplanar and multiecho pulse sequences of the cervical spine, to include the craniocervical junction and cervicothoracic junction,  were obtained according to standard protocol without and with intravenous contrast. CONTRAST:  23mL MULTIHANCE GADOBENATE DIMEGLUMINE 529 MG/ML IV SOLN COMPARISON:  Neck CTA 09/16/2015 FINDINGS: Multiple sequences are mildly to moderately motion degraded. There is straightening of the normal cervical lordosis. There is trace retrolisthesis of C5 on C6 and C6 on C7, likely degenerative. Mild disc space narrowing and degenerative endplate spurring are present at C5-6 and C6-7. No significant vertebral marrow edema is seen. A left-sided atlanto-occipital joint effusion is present. No definite cervical spinal cord signal abnormality is identified, however evaluation is limited by motion artifact. No abnormal enhancement is seen. Paraspinal soft tissues are unremarkable. C2-3:  No disc herniation or stenosis. C3-4:  Minimal right uncovertebral spurring without stenosis. C4-5:  Minimal right uncovertebral spurring without stenosis. C5-6: Broad-based posterior disc osteophyte complex results in mild bilateral neural foraminal stenosis. There is flattening of the ventral thecal sac and a slight impression on the ventral spinal cord without significant spinal stenosis. C6-7: Disc bulging and uncovertebral spurring result in mild spinal stenosis with slight ventral cord flattening and mild left greater than right neural foraminal stenosis. C7-T1:  Negative. IMPRESSION: 1. Mildly to moderately motion degraded examination. 2. Mild spinal stenosis and mild bilateral neural foraminal stenosis at C6-7. 3. Mild bilateral foraminal stenosis at C5-6. Electronically Signed   By: Logan Bores M.D.   On: 11/07/2015 17:02   US Carotid Bilateral  11/08/2015  CLINICAL DATA:  50 year old female with an acute/subacute infarct in the right parietal occipital region. EXAM: BILATERAL CAROTID DUPLEX ULTRASOUND TECHNIQUE: Pearline Cables scale imaging, color Doppler and duplex ultrasound were performed of bilateral carotid and vertebral arteries in the  neck. COMPARISON:  Brain MRI 11/07/2015 FINDINGS: Criteria: Quantification of carotid stenosis is based on velocity parameters that correlate the residual internal carotid diameter with NASCET-based stenosis levels, using the diameter of the distal internal carotid lumen as the denominator for stenosis measurement. The following velocity measurements were obtained: RIGHT ICA:  43/6 cm/sec  CCA:  123XX123 cm/sec SYSTOLIC ICA/CCA RATIO:  0.8 DIASTOLIC ICA/CCA RATIO:  1.0 ECA:  60 cm/sec LEFT ICA:  58/22 cm/sec CCA:  0000000 cm/sec SYSTOLIC ICA/CCA RATIO:  0.9 DIASTOLIC ICA/CCA RATIO:  1.0 ECA:  80 cm/sec RIGHT CAROTID ARTERY: Trace smooth hypoechoic atherosclerotic plaque in the proximal internal carotid artery. By peak systolic velocity criteria, the estimated stenosis is less than 50%. RIGHT VERTEBRAL ARTERY:  Patent with antegrade flow LEFT CAROTID ARTERY: Focal heterogeneous atherosclerotic plaque in the proximal internal carotid artery. By peak systolic velocity criteria the estimated stenosis remains less than 50%. LEFT VERTEBRAL ARTERY:  Patent with normal antegrade flow. IMPRESSION: 1. Mild (1-49%) stenosis proximal right internal carotid artery secondary to trace smooth hypoechoic atherosclerotic plaque. 2. Mild (1-49%) stenosis proximal left internal carotid artery secondary to heterogenous atherosclerotic plaque. 3. Vertebral arteries are patent with normal antegrade flow. Signed, Criselda Peaches, MD [facility] Electronically Signed   By: Jacqulynn Cadet M.D.   On: 11/08/2015 10:27   Mr Jodene Nam Head/brain Wo Cm  11/08/2015  CLINICAL DATA:  Abnormal gait. Multiple falls. Weakness in both hands. Subacute infarctions involving the right parieto-occipital region and anterior right frontal lobe. EXAM: MRA HEAD WITHOUT CONTRAST TECHNIQUE: Angiographic images of the Circle of Willis were obtained using MRA technique without intravenous contrast.  COMPARISON:  MRI brain 11/07/2015. FINDINGS: The internal carotid arteries are within normal limits from the high cervical segments through the ICA termini bilaterally. The anterior communicating artery is patent. 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 The left A1 segment is intact. M1 segments are normal bilaterally. The MCA bifurcations are intact. There is moderate attenuation of distal MCA branch vessels bilaterally. The left vertebral artery is slightly dominant to the right. The left PICA origin is visualized and normal. The right AICA is dominant. The basilar artery is normal. The right posterior cerebral artery is of fetal type. There is some attenuation of distal PCA branch vessels bilaterally. IMPRESSION: 1. No significant proximal stenosis, aneurysm, or branch vessel occlusion. 2. 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 3. Mild-to-moderate distal branch vessel narrowing in the ACA and PCA distributions bilaterally. Electronically Signed   By: San Morelle M.D.   On: 11/08/2015 10:10    EKG:   Orders placed or performed during the hospital encounter of 11/07/15  . ED EKG  . ED EKG  . EKG 12-Lead  . EKG 12-Lead      Management plans discussed with the patient, family and they are in agreement.  CODE STATUS:     Code Status Orders        Start     Ordered   11/07/15 2045  Full code   Continuous     11/07/15 2044    Code Status History    Date Active Date Inactive Code Status Order ID Comments User Context   06/21/2015  4:39 PM 06/22/2015  8:36 PM Full Code [id number]  Radene Gunning, NP Inpatient   06/11/2015  5:13 PM 06/14/2015  8:01 PM Full Code [id number]  Alexis Goodell, MD Inpatient      TOTAL TIME TAKING CARE OF THIS PATIENT: 37 minutes.    Mistee Soliman M.D on 11/10/2015 at 11:51 AM  Between 7am to 6pm - Pager - [phone]  After 6pm go to [email or web address] - password [email or web address] [facility]   Office  [phone]  CC: Primary care physician; Chriss Czar, MD

## 2015-11-09 NOTE — Progress Notes (Signed)
[facility] [place] NAME: Tina Sharp    MR#:  [id number]  DATE OF BIRTH:  May 16, 1966  SUBJECTIVE:  CHIEF COMPLAINT:   Chief Complaint  Patient presents with  . Extremity Weakness   - still feels about the same, weakness noted- generalized.  complains of mouth pain, lips hurting due to skin breakdown - admitted for recurrent CVA - needs SNF- awaiting insurance authorization  REVIEW OF SYSTEMS:  Review of Systems  Constitutional: Positive for malaise/fatigue. Negative for fever and chills.  HENT: Negative for ear discharge, ear pain and nosebleeds.        Corners of lips with erythema and skin breakdown  Respiratory: Negative for cough, shortness of breath and wheezing.   Cardiovascular: Negative for chest pain, palpitations and leg swelling.  Gastrointestinal: Negative for nausea, vomiting, abdominal pain, diarrhea and constipation.  Genitourinary: Negative for dysuria and urgency.  Musculoskeletal: Positive for myalgias and joint pain.  Neurological: Positive for tingling, tremors and focal weakness. Negative for dizziness, seizures and headaches.  Psychiatric/Behavioral: Negative for depression.    DRUG ALLERGIES:  No Known Allergies  VITALS:  Blood pressure 144/89, pulse 97, temperature 97.8 F (36.6 C), temperature source Oral, resp. rate 18, height 5\' 2"  (1.575 m), weight 65.772 kg (145 lb), SpO2 99 %.  PHYSICAL EXAMINATION:  Physical Exam  GENERAL:  50 y.o.-year-old patient lying in the bed with no acute distress.  EYES: Pupils equal, round, reactive to light and accommodation. No scleral icterus. Extraocular muscles intact.  HEENT: Head atraumatic, normocephalic. Oropharynx and nasopharynx clear. Red beefy tongue with lost papillae, chelitis at the corners of the mouth noted. NECK:  Supple, no jugular venous distention. No thyroid enlargement, no tenderness.  LUNGS: Normal breath sounds bilaterally, no wheezing,  rales,rhonchi or crepitation. No use of accessory muscles of respiration.  CARDIOVASCULAR: S1, S2 normal. No murmurs, rubs, or gallops.  ABDOMEN: Soft, nontender, nondistended. Bowel sounds present. No organomegaly or mass.  EXTREMITIES: No pedal edema, cyanosis, or clubbing.  NEUROLOGIC: Cranial nerves II through XII are intact. Muscle strength 3/5 in both lower extremities and 3/5 LUE and 4/5 RUE. Sensation intact. Gait not checked.  PSYCHIATRIC: The patient is alert and oriented x 3.  SKIN: No obvious rash, lesion, or ulcer.    LABORATORY PANEL:   CBC  Recent Labs Lab 11/07/15 1325  WBC 5.8  HGB 13.8  HCT 39.3  PLT 197   ------------------------------------------------------------------------------------------------------------------  Chemistries   Recent Labs Lab 11/07/15 1325  11/09/15 0419  NA 134*  < > 133*  K 2.9*  < > 3.7  CL 100*  < > 98*  CO2 20*  < > 28  GLUCOSE 91  < > 93  BUN 6  < > 8  CREATININE 0.53  < > 0.46  CALCIUM 7.7*  < > 7.9*  AST 57*  --   --   ALT 28  --   --   ALKPHOS 79  --   --   BILITOT 1.3*  --   --   < > = values in this interval not displayed. ------------------------------------------------------------------------------------------------------------------  Cardiac Enzymes No results for input(s): TROPONINI in the last 168 hours. ------------------------------------------------------------------------------------------------------------------  RADIOLOGY:  [name] Contrast  11/07/2015  CLINICAL DATA:  Weakness, muscle wasting, involuntary muscle contractions over the past 3 weeks. EXAM: MRI HEAD WITHOUT AND WITH CONTRAST TECHNIQUE: Multiplanar, multiecho pulse sequences of the brain and surrounding structures were obtained without and with intravenous  contrast. CONTRAST:  49mL MULTIHANCE GADOBENATE DIMEGLUMINE 529 MG/ML IV SOLN COMPARISON:  Head CT 04/04/2016 and MRI 06/21/2015 FINDINGS: There is a small region of cortical  restricted diffusion in the right parieto-occipital region consistent with acute or early subacute infarct superimposed on an area of late subacute ischemia demonstrated on the prior MRI. There is a trace amount of associated petechial blood products. There are small areas of slightly increased trace diffusion signal involving cortex and subcortical white matter in the right frontal and parietal lobes without definite restricted diffusion on the ADC map, most consistent with older subacute infarcts and largely corresponding to the infarcts demonstrated on the prior MRI. No mass, midline shift, or extra-axial fluid collection is seen. Slight cerebral atrophy is unchanged. There is mild enhancement associated with some of the infarcts. Orbits are unremarkable. Paranasal sinuses and mastoid air cells are clear. Major intracranial vascular flow voids are preserved. IMPRESSION: 1. Small area of acute/early subacute infarct in the right parieto-occipital region. 2. Multiple late subacute infarcts in the right cerebral hemisphere as previously demonstrated. Electronically Signed   By: Logan Bores M.D.   On: 11/07/2015 16:55   Mr Cervical Spine W Wo Contrast  11/07/2015  CLINICAL DATA:  Weakness. Involuntary muscle contractions over the last 3 weeks. EXAM: MRI CERVICAL SPINE WITHOUT AND WITH CONTRAST TECHNIQUE: Multiplanar and multiecho pulse sequences of the cervical spine, to include the craniocervical junction and cervicothoracic junction, were obtained according to standard protocol without and with intravenous contrast. CONTRAST:  58mL MULTIHANCE GADOBENATE DIMEGLUMINE 529 MG/ML IV SOLN COMPARISON:  Neck CTA 09/16/2015 FINDINGS: Multiple sequences are mildly to moderately motion degraded. There is straightening of the normal cervical lordosis. There is trace retrolisthesis of C5 on C6 and C6 on C7, likely degenerative. Mild disc space narrowing and degenerative endplate spurring are present at C5-6 and C6-7. No  significant vertebral marrow edema is seen. A left-sided atlanto-occipital joint effusion is present. No definite cervical spinal cord signal abnormality is identified, however evaluation is limited by motion artifact. No abnormal enhancement is seen. Paraspinal soft tissues are unremarkable. C2-3:  No disc herniation or stenosis. C3-4:  Minimal right uncovertebral spurring without stenosis. C4-5:  Minimal right uncovertebral spurring without stenosis. C5-6: Broad-based posterior disc osteophyte complex results in mild bilateral neural foraminal stenosis. There is flattening of the ventral thecal sac and a slight impression on the ventral spinal cord without significant spinal stenosis. C6-7: Disc bulging and uncovertebral spurring result in mild spinal stenosis with slight ventral cord flattening and mild left greater than right neural foraminal stenosis. C7-T1:  Negative. IMPRESSION: 1. Mildly to moderately motion degraded examination. 2. Mild spinal stenosis and mild bilateral neural foraminal stenosis at C6-7. 3. Mild bilateral foraminal stenosis at C5-6. Electronically Signed   By: Logan Bores M.D.   On: 11/07/2015 17:02   US Carotid Bilateral  11/08/2015  CLINICAL DATA:  50 year old female with an acute/subacute infarct in the right parietal occipital region. EXAM: BILATERAL CAROTID DUPLEX ULTRASOUND TECHNIQUE: Pearline Cables scale imaging, color Doppler and duplex ultrasound were performed of bilateral carotid and vertebral arteries in the neck. COMPARISON:  Brain MRI 11/07/2015 FINDINGS: Criteria: Quantification of carotid stenosis is based on velocity parameters that correlate the residual internal carotid diameter with NASCET-based stenosis levels, using the diameter of the distal internal carotid lumen as the denominator for stenosis measurement. The following velocity measurements were obtained: RIGHT ICA:  43/6 cm/sec CCA:  123XX123 cm/sec SYSTOLIC ICA/CCA RATIO:  0.8 DIASTOLIC ICA/CCA RATIO:  1.0 ECA:  60  cm/sec LEFT ICA:  58/22 cm/sec CCA:  0000000 cm/sec SYSTOLIC ICA/CCA RATIO:  0.9 DIASTOLIC ICA/CCA RATIO:  1.0 ECA:  80 cm/sec RIGHT CAROTID ARTERY: Trace smooth hypoechoic atherosclerotic plaque in the proximal internal carotid artery. By peak systolic velocity criteria, the estimated stenosis is less than 50%. RIGHT VERTEBRAL ARTERY:  Patent with antegrade flow LEFT CAROTID ARTERY: Focal heterogeneous atherosclerotic plaque in the proximal internal carotid artery. By peak systolic velocity criteria the estimated stenosis remains less than 50%. LEFT VERTEBRAL ARTERY:  Patent with normal antegrade flow. IMPRESSION: 1. Mild (1-49%) stenosis proximal right internal carotid artery secondary to trace smooth hypoechoic atherosclerotic plaque. 2. Mild (1-49%) stenosis proximal left internal carotid artery secondary to heterogenous atherosclerotic plaque. 3. Vertebral arteries are patent with normal antegrade flow. Signed, Criselda Peaches, MD [facility] Electronically Signed   By: Jacqulynn Cadet M.D.   On: 11/08/2015 10:27   Mr Jodene Nam Head/brain Wo Cm  11/08/2015  CLINICAL DATA:  Abnormal gait. Multiple falls. Weakness in both hands. Subacute infarctions involving the right parieto-occipital region and anterior right frontal lobe. EXAM: MRA HEAD WITHOUT CONTRAST TECHNIQUE: Angiographic images of the Circle of Willis were obtained using MRA technique without intravenous contrast. COMPARISON:  MRI brain 11/07/2015. FINDINGS: The internal carotid arteries are within normal limits from the high cervical segments through the ICA termini bilaterally. The anterior communicating artery is patent. 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 The left A1 segment is intact. M1 segments are normal bilaterally. The MCA bifurcations are intact. There is moderate attenuation of distal MCA branch vessels bilaterally. The left vertebral artery is slightly dominant to the right. The left  PICA origin is visualized and normal. The right AICA is dominant. The basilar artery is normal. The right posterior cerebral artery is of fetal type. There is some attenuation of distal PCA branch vessels bilaterally. IMPRESSION: 1. No significant proximal stenosis, aneurysm, or branch vessel occlusion. 2. 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 3. Mild-to-moderate distal branch vessel narrowing in the ACA and PCA distributions bilaterally. Electronically Signed   By: San Morelle M.D.   On: 11/08/2015 10:10    EKG:   Orders placed or performed during the hospital encounter of 11/07/15  . ED EKG  . ED EKG  . EKG 12-Lead  . 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 Hypercoagulable workup came back negative. -She had TTE and TEE which were unremarkable and also loop recorder placed at the time. -She has been on Coumadin since November 2016. -Prior to admission patient also has been drinking heavily in the. Also has known B12 deficiency is but hasn't been taking her supplements.Normal B12 levels now - Appreciate neurology consult -Adjust coumadin dose so it can be therapeutic between 2-3 range. No bridging needed - statin added, though LDL within normal limits - MRI with new acute right parieto occipital infarcts- pt continues to smoke - Physical therapy consult, occupational therapy consults requested.  will need rehabilitation this time. - Appreciate speech therapy consult- puree diet with thin liquids recommended  #2 Chelitis and paresthesias- B12 level is normal.  Continue B complex supplements -Orajel for pain and also Neurontin added.  #3 Tobacco abuse and also alcohol abuse- nicotine patch and  CIWA protocol Counseled  #4 hypokalemia-replaced.  #5 HTN- added metoprolol and norvasc  #6 DVT prophylaxis-on Coumadin  #7 Depression and anxiety- increased celexa dose and also on klonopin   Discharge to SNF    All the records are reviewed and case discussed with Care Management/Social Workerr. Management plans discussed with the patient, family and they are in agreement.  CODE STATUS: Full code  TOTAL TIME TAKING CARE OF THIS PATIENT: 38 minutes.   POSSIBLE D/C IN 1DAYS, DEPENDING ON CLINICAL CONDITION.   Gladstone Lighter M.D on 11/09/2015 at 9:52 AM  Between 7am to 6pm - Pager - [phone]  After 6pm go to [email or web address] - password [email or web address] [facility]  Office  [phone]  CC: Primary care physician; Chriss Czar, MD

## 2015-11-09 NOTE — Progress Notes (Signed)
ANTICOAGULATION CONSULT NOTE - Initial Consult  Pharmacy Consult for Warfarin Indication: stroke  No Known Allergies  Patient Measurements: Height: 5\' 2"  (157.5 cm) Weight: 145 lb (65.772 kg) IBW/kg (Calculated) : 50.1   Vital Signs: Temp: 97.8 F (36.6 C) (03/29 0407) Temp Source: Oral (03/29 0407) BP: 138/80 mmHg (03/29 0407) Pulse Rate: 92 (03/29 0407)  Labs:  Recent Labs  11/07/15 1325 11/07/15 1730 11/08/15 1238 11/08/15 1615 11/09/15 0419  HGB 13.8  --   --   --   --   HCT 39.3  --   --   --   --   PLT 197  --   --   --   --   APTT  --  28  --   --   --   LABPROT  --  16.8* 15.5*  --  16.0*  INR  --  1.35 1.21  --  1.27  CREATININE 0.53  --   --  0.49 0.46    Estimated Creatinine Clearance: 73.2 mL/min (by C-G formula based on Cr of 0.46).    Assessment: 50 yo female admitted with recurrent stroke and subtherapeutic INR. Spoke with pt on 3/28, pt states she takes half a 5 mg tablet (=2.5 mg) daily, last dose night before she came in (=3/26).   3/27 INR 1.35, Hgb 13.8, Plt 197 -  Appears no warfarin was given 3/28 INR 1.21 Warfarin 5mg  3/29 INR 1.27    Goal of Therapy:  INR 2-3 Monitor platelets by anticoagulation protocol: Yes   Plan:  INR was subtherapeutic on admission.  Will increase daily dose by 20% to 3mg  daily due to subtherapeutic INR on admission. Will continue to monitor and check INR daily. CBC tomorrow AM. Counseled pt on warfarin 11/08/15.   Pharmacy will continue to follow.    Graiden Henes D Ellouise Mcwhirter 11/09/2015,7:29 AM

## 2015-11-09 NOTE — Progress Notes (Signed)
Physical Therapy Treatment Patient Details Name: Tina Sharp MRN: [id number] DOB: 05-08-1966 Today's Date: 11/09/2015    History of Present Illness Pt. is a 50 y.o. F admitted to hospital for R CVA (parieto-occipital and cerebral). Pt. has hx of cancer, HTN, depression, anxiety, and multiple strokes. Most recent stroke prior to present was 08/18/15. Pt has hx of falls. Recent B LE weakness has led to falls and difficulty w/ambulation. Pt had to stop working 09/14/15 due to weakness/pain in hands.     PT Comments    Pt is progressing towards goals. Pt able to perform bed mobility with 2+ mod assist, requiring assist with trunk and hand placement. Pt able to perform transfer from sit to stand on EOB with 2+ mod assist and RW. Pt able to stand w/ 2+ mod assist and RW for longer than previous session. Pt performed sit to stand x2 before limited by fatigue. Pt performed supine and seated ther-ex. Pt able to perform seated dynamic balance exercises with mod assist of trunk. Pt vision tested with line test; able to see lines, however unable to draw straight line due to issues with grasping pen. Pt demonstrates slight improvement compared to previous visit. Pt has deficits in strength, balance and mobility. Pt would benefit from skilled therapy to address deficits; recommend pt go to SNF after discharge from acute hospitalization.   Follow Up Recommendations  SNF     Equipment Recommendations  Rolling walker with 5" wheels    Recommendations for Other Services       Precautions / Restrictions Precautions Precautions: Fall Restrictions Weight Bearing Restrictions: No    Mobility  Bed Mobility Overal bed mobility: +2 for physical assistance             General bed mobility comments: Pt able to move B LE to EOB. Pt demonstrated poor trunk control, needed support keeping trunk upright. Pt able to grab railing w/B UE. Mod assist +2  Transfers Overall transfer level: Needs  assistance Equipment used: Rolling walker (2 wheeled) Transfers: Sit to/from Stand Sit to Stand: +2 physical assistance;Mod assist         General transfer comment: Pt needed RW and 2+ mod assist to perfrom sit to stand from EOB. Pt able to initiate movement, however needed assist with anterior translation to achieve upright posture. Pt placed hands on RW w/o cues. Did not need to block pts knees. Pt able to perfrom sit to stand x2.   Ambulation/Gait             General Gait Details: Due to safety concerns and poor balance pt unable to ambulate at this time.    Stairs            Wheelchair Mobility    Modified Rankin (Stroke Patients Only)       Balance Overall balance assessment: Needs assistance Sitting-balance support: Feet supported;Single extremity supported Sitting balance-Leahy Scale: Poor   Postural control: Other (comment) (Poor trunk control) Standing balance support: Bilateral upper extremity supported Standing balance-Leahy Scale: Poor Standing balance comment: Pt still demonstrates poor standing balance, however able to stand for longer than previous session with 2+ mod assist and RW.                     Cognition Arousal/Alertness: Awake/alert Behavior During Therapy: Anxious Overall Cognitive Status: Within Functional Limits for tasks assessed  Exercises Other Exercises Other Exercises: Pt perfomed supine ther-ex on B LE including heel slides, SLR, hip abd, and supine bridging with min to no assist. PT provided assist. to maintain foot placement and stability in LE. In sitting, pt able to perfrom B LE LAQ with no assist. All ther-ex performed x10 reps, other than supine bridge x5 reps.  Other Exercises: Pt performed sitting balance exercises including reaching for object with B UE separately x10 with mod assist to facilitate weight shift. Pt performed lateral weight shifts x10 and AP weight shifts x5 with mod  assist to correct balance. Pt demonstrated more difficulty with AP than lateral. Pt fearful of losing balance t/o exercises. Multiple loss of balance noted.      General Comments        Pertinent Vitals/Pain Pain Assessment: 0-10 Pain Score: 4  Pain Location: B LE Pain Descriptors / Indicators: Aching;Burning Pain Intervention(s): Limited activity within patient's tolerance    Home Living                      Prior Function            PT Goals (current goals can now be found in the care plan section) Acute Rehab PT Goals Patient Stated Goal: Return to being independent with ADLs PT Goal Formulation: With patient Time For Goal Achievement: 11/22/15 Potential to Achieve Goals: Fair Progress towards PT goals: Progressing toward goals    Frequency  7X/week    PT Plan Current plan remains appropriate    Co-evaluation             End of Session Equipment Utilized During Treatment: Gait belt Activity Tolerance: Patient limited by fatigue Patient left: in bed;with bed alarm set;with family/visitor present     Time: [id number] PT Time Calculation (min) (ACUTE ONLY): 23 min  Charges:                       G Codes:      Sherral Hammers 2015/11/18, 5:03 PM M. Barnett Abu, SPT

## 2015-11-09 NOTE — Progress Notes (Signed)
Initial Nutrition Assessment  DOCUMENTATION CODES:   Not applicable  INTERVENTION:   Meals and Snacks: Cater to patient preferences, SLP following, pt currently on dysphagia III diet order Medical Food Supplement Therapy: recommend Ensure Enlive po BID, each supplement provides 350 kcal and 20 grams of protein Coordination of Care: will recommend collecting measured weight   NUTRITION DIAGNOSIS:   Inadequate oral intake related to chronic illness as evidenced by per patient/family report.  GOAL:   Patient will meet greater than or equal to 90% of their needs  MONITOR:   PO intake, Supplement acceptance, Labs, Weight trends, I & O's  REASON FOR ASSESSMENT:   Consult Poor PO  ASSESSMENT:   Pt admitted with acute CVA with h/o recurrent CVAs and breast cancer.  Diet Order:  DIET DYS 3 Room service appropriate?: Yes with Assist; Fluid consistency:: Thin Diet - low sodium heart healthy    Current Nutrition: Pt ate 40% of cereal and peaches this am for breakfast, unopened jello at bedside. Pt reports chewing and swallowing well this am.   Food/Nutrition-Related History: Pt reports very poor po intake for the past month. Pt reports at first, she did not want to finish what she was eating/her meals but as the month progressed she does not want to eat at all.  Pt reports 'forcing' herself to eat something. Pt reports getting Ensure recently (only a 6 pack) and that she has been able to drink 1/2 bottle and then put it back in fridge and drink the other half later in the day.   Scheduled Medications:  . amLODipine  5 mg Oral Daily  . anastrozole  1 mg Oral QPM  . artificial tears   Both Eyes 3 times per day  . atorvastatin  40 mg Oral q1800  . [START ON 11/10/2015] citalopram  20 mg Oral Daily  . folic acid  1 mg Oral Daily  . gabapentin  100 mg Oral BID  . metoprolol tartrate  25 mg Oral BID  . multivitamin with minerals  1 tablet Oral Daily  . nicotine  21 mg Transdermal  Daily  . pantoprazole  40 mg Oral QAC breakfast  . vitamin B-6  25 mg Oral Daily  . vitamin B-12  500 mcg Oral Daily  . warfarin  3 mg Oral q1800  . Warfarin - Pharmacist Dosing Inpatient   Does not apply q1800    Electrolyte/Renal Profile and Glucose Profile:   Recent Labs Lab 11/07/15 1325 11/08/15 1615 11/09/15 0419  NA 134* 132* 133*  K 2.9* 4.0 3.7  CL 100* 98* 98*  CO2 20* 24 28  BUN 6 11 8   CREATININE 0.53 0.49 0.46  CALCIUM 7.7* 8.1* 7.9*  GLUCOSE 91 84 93   Protein Profile:   Recent Labs Lab 11/04/15 1057 11/07/15 1325  ALBUMIN 2.6* 2.5*    Gastrointestinal Profile: Last BM:  10/27/2015 +BS, abdomen soft, nontender   Nutrition-Focused Physical Exam Findings: Nutrition-Focused physical exam completed. Findings are WDL for fat depletion, muscle depletion, and edema.    Weight Change: Pt reports weight of 145lbs October 29th prior to stroke, and reports being surprised of weight of 133lbs on admission (8% weight loss in 5 months)   Skin:   (WDL except abrasions on knee and arm)   Height:   Ht Readings from Last 1 Encounters:  11/07/15 5\' 2"  (1.575 m)    Weight:   Wt Readings from Last 1 Encounters:  11/07/15 145 lb (65.772 kg)  BMI:  Body mass index is 26.51 kg/(m^2).  Estimated Nutritional Needs:   Kcal:  1650-1970kcals (25-30kcals/kg)  Protein:  66-78g protein (1.0-1.2g/kg)  Fluid:  1.7-2L  EDUCATION NEEDS:   No education needs identified at this time   Dwyane Luo, RD, LDN Pager [phone] Weekend/On-Call Pager [phone]

## 2015-11-09 NOTE — Progress Notes (Signed)
Subjective: Patient with no new complaints.    Objective: Current vital signs: BP 144/89 mmHg  Pulse 97  Temp(Src) 97.8 F (36.6 C) (Oral)  Resp 18  Ht '5\' 2"'  (1.575 m)  Wt 65.772 kg (145 lb)  BMI 26.51 kg/m2  SpO2 99% Vital signs in last 24 hours: Temp:  [97.8 F (36.6 C)-98.6 F (37 C)] 97.8 F (36.6 C) (03/29 0407) Pulse Rate:  [88-104] 97 (03/29 0824) Resp:  [18-20] 18 (03/29 0407) BP: (129-158)/(77-89) 144/89 mmHg (03/29 0824) SpO2:  [95 %-100 %] 99 % (03/29 0407)  Intake/Output from previous day: 03/28 0701 - 03/29 0700 In: 53 [P.O.:51] Out: -  Intake/Output this shift:   Nutritional status: DIET DYS 3 Room service appropriate?: Yes with Assist; Fluid consistency:: Thin Diet - low sodium heart healthy  Neurologic Exam: Mental Status: Alert, oriented, thought content appropriate. Speech fluent without evidence of aphasia. Able to follow 3 step commands without difficulty. Cranial Nerves: II: Discs flat bilaterally; Visual fields grossly normal, pupils equal, round, reactive to light and accommodation III,IV, VI: ptosis not present, extra-ocular motions intact bilaterally V,VII: smile symmetric, facial light touch sensation normal bilaterally VIII: hearing normal bilaterally IX,X: gag reflex present XI: bilateral shoulder shrug XII: midline tongue extension Motor: 4+-5-/5 throughout with patient somewhat weaker on the left. No drift noted.  Cerebellar: Dysmetria with finger-to-nose and heel-to-shin testing bilaterally (left greater than right) Gait: not tested due to safety concerns  Lab Results: Basic Metabolic Panel:  Recent Labs Lab 11/04/15 1057 11/07/15 1325 11/08/15 1615 11/09/15 0419  NA 133* 134* 132* 133*  K 2.9* 2.9* 4.0 3.7  CL 96* 100* 98* 98*  CO2 24 20* 24 28  GLUCOSE 79 91 84 93  BUN '8 6 11 8  ' CREATININE 0.56 0.53 0.49 0.46  CALCIUM 8.0* 7.7* 8.1* 7.9*    Liver Function Tests:  Recent Labs Lab 11/04/15 1057 11/07/15 1325   AST 45* 57*  ALT 23 28  ALKPHOS 78 79  BILITOT 1.3* 1.3*  PROT 5.6* 5.5*  ALBUMIN 2.6* 2.5*   No results for input(s): LIPASE, AMYLASE in the last 168 hours. No results for input(s): AMMONIA in the last 168 hours.  CBC:  Recent Labs Lab 11/04/15 1057 11/07/15 1325  WBC 10.5 5.8  NEUTROABS  --  3.9  HGB 14.0 13.8  HCT 39.6 39.3  MCV 100.5* 101.7*  PLT 283 197    Cardiac Enzymes:  Recent Labs Lab 11/04/15 1057  CKTOTAL 225    Lipid Panel:  Recent Labs Lab 11/08/15 0439  CHOL 158  TRIG 73  HDL 70  CHOLHDL 2.3  VLDL 15  LDLCALC 73    CBG: No results for input(s): GLUCAP in the last 168 hours.  Microbiology: Results for orders placed or performed during the hospital encounter of 06/11/15  MRSA PCR Screening     Status: None   Collection Time: 06/11/15  4:20 PM  Result Value Ref Range Status   MRSA by PCR NEGATIVE NEGATIVE Final    Comment:        The GeneXpert MRSA Assay (FDA approved for NASAL specimens only), is one component of a comprehensive MRSA colonization surveillance program. It is not intended to diagnose MRSA infection nor to guide or monitor treatment for MRSA infections.     Coagulation Studies:  Recent Labs  11/07/15 1730 11/08/15 1238 11/09/15 0419  LABPROT 16.8* 15.5* 16.0*  INR 1.35 1.21 1.27    Imaging: Mr Jeri Cos WG Contrast  11/07/2015  CLINICAL DATA:  Weakness, muscle wasting, involuntary muscle contractions over the past 3 weeks. EXAM: MRI HEAD WITHOUT AND WITH CONTRAST TECHNIQUE: Multiplanar, multiecho pulse sequences of the brain and surrounding structures were obtained without and with intravenous contrast. CONTRAST:  77m MULTIHANCE GADOBENATE DIMEGLUMINE 529 MG/ML IV SOLN COMPARISON:  Head CT 04/04/2016 and MRI 06/21/2015 FINDINGS: There is a small region of cortical restricted diffusion in the right parieto-occipital region consistent with acute or early subacute infarct superimposed on an area of late subacute  ischemia demonstrated on the prior MRI. There is a trace amount of associated petechial blood products. There are small areas of slightly increased trace diffusion signal involving cortex and subcortical white matter in the right frontal and parietal lobes without definite restricted diffusion on the ADC map, most consistent with older subacute infarcts and largely corresponding to the infarcts demonstrated on the prior MRI. No mass, midline shift, or extra-axial fluid collection is seen. Slight cerebral atrophy is unchanged. There is mild enhancement associated with some of the infarcts. Orbits are unremarkable. Paranasal sinuses and mastoid air cells are clear. Major intracranial vascular flow voids are preserved. IMPRESSION: 1. Small area of acute/early subacute infarct in the right parieto-occipital region. 2. Multiple late subacute infarcts in the right cerebral hemisphere as previously demonstrated. Electronically Signed   By: ALogan BoresM.D.   On: 11/07/2015 16:55   Mr Cervical Spine W Wo Contrast  11/07/2015  CLINICAL DATA:  Weakness. Involuntary muscle contractions over the last 3 weeks. EXAM: MRI CERVICAL SPINE WITHOUT AND WITH CONTRAST TECHNIQUE: Multiplanar and multiecho pulse sequences of the cervical spine, to include the craniocervical junction and cervicothoracic junction, were obtained according to standard protocol without and with intravenous contrast. CONTRAST:  156mMULTIHANCE GADOBENATE DIMEGLUMINE 529 MG/ML IV SOLN COMPARISON:  Neck CTA 09/16/2015 FINDINGS: Multiple sequences are mildly to moderately motion degraded. There is straightening of the normal cervical lordosis. There is trace retrolisthesis of C5 on C6 and C6 on C7, likely degenerative. Mild disc space narrowing and degenerative endplate spurring are present at C5-6 and C6-7. No significant vertebral marrow edema is seen. A left-sided atlanto-occipital joint effusion is present. No definite cervical spinal cord signal  abnormality is identified, however evaluation is limited by motion artifact. No abnormal enhancement is seen. Paraspinal soft tissues are unremarkable. C2-3:  No disc herniation or stenosis. C3-4:  Minimal right uncovertebral spurring without stenosis. C4-5:  Minimal right uncovertebral spurring without stenosis. C5-6: Broad-based posterior disc osteophyte complex results in mild bilateral neural foraminal stenosis. There is flattening of the ventral thecal sac and a slight impression on the ventral spinal cord without significant spinal stenosis. C6-7: Disc bulging and uncovertebral spurring result in mild spinal stenosis with slight ventral cord flattening and mild left greater than right neural foraminal stenosis. C7-T1:  Negative. IMPRESSION: 1. Mildly to moderately motion degraded examination. 2. Mild spinal stenosis and mild bilateral neural foraminal stenosis at C6-7. 3. Mild bilateral foraminal stenosis at C5-6. Electronically Signed   By: AlLogan Bores.D.   On: 11/07/2015 17:02   Us[place]arotid Bilateral  11/08/2015  CLINICAL DATA:  [age]ear old female with an acute/subacute infarct in the right parietal occipital region. EXAM: BILATERAL CAROTID DUPLEX ULTRASOUND TECHNIQUE: GrPearline Cablescale imaging, color Doppler and duplex ultrasound were performed of bilateral carotid and vertebral arteries in the neck. COMPARISON:  Brain MRI 11/07/2015 FINDINGS: Criteria: Quantification of carotid stenosis is based on velocity parameters that correlate the residual internal carotid diameter with NASCET-based stenosis levels, using the diameter of  the distal internal carotid lumen as the denominator for stenosis measurement. The following velocity measurements were obtained: RIGHT ICA:  43/6 cm/sec CCA:  25/95 cm/sec SYSTOLIC ICA/CCA RATIO:  0.8 DIASTOLIC ICA/CCA RATIO:  1.0 ECA:  60 cm/sec LEFT ICA:  58/22 cm/sec CCA:  63/87 cm/sec SYSTOLIC ICA/CCA RATIO:  0.9 DIASTOLIC ICA/CCA RATIO:  1.0 ECA:  80 cm/sec RIGHT CAROTID ARTERY:  Trace smooth hypoechoic atherosclerotic plaque in the proximal internal carotid artery. By peak systolic velocity criteria, the estimated stenosis is less than 50%. RIGHT VERTEBRAL ARTERY:  Patent with antegrade flow LEFT CAROTID ARTERY: Focal heterogeneous atherosclerotic plaque in the proximal internal carotid artery. By peak systolic velocity criteria the estimated stenosis remains less than 50%. LEFT VERTEBRAL ARTERY:  Patent with normal antegrade flow. IMPRESSION: 1. Mild (1-49%) stenosis proximal right internal carotid artery secondary to trace smooth hypoechoic atherosclerotic plaque. 2. Mild (1-49%) stenosis proximal left internal carotid artery secondary to heterogenous atherosclerotic plaque. 3. Vertebral arteries are patent with normal antegrade flow. Signed, Criselda Peaches, MD [facility] Radiology Electronically Signed   By: Jacqulynn Cadet M.D.   On: 11/08/2015 10:27   Mr Jodene Nam Head/brain Wo Cm  11/08/2015  CLINICAL DATA:  Abnormal gait. Multiple falls. Weakness in both hands. Subacute infarctions involving the right parieto-occipital region and anterior right frontal lobe. EXAM: MRA HEAD WITHOUT CONTRAST TECHNIQUE: Angiographic images of the Circle of Willis were obtained using MRA technique without intravenous contrast. COMPARISON:  MRI brain 11/07/2015. FINDINGS: The internal carotid arteries are within normal limits from the high cervical segments through the ICA termini bilaterally. The anterior communicating artery is patent. 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 The left A1 segment is intact. M1 segments are normal bilaterally. The MCA bifurcations are intact. There is moderate attenuation of distal MCA branch vessels bilaterally. The left vertebral artery is slightly dominant to the right. The left PICA origin is visualized and normal. The right AICA is dominant. The basilar artery is normal. The right posterior cerebral artery is of fetal  type. There is some attenuation of distal PCA branch vessels bilaterally. IMPRESSION: 1. No significant proximal stenosis, aneurysm, or branch vessel occlusion. 2. 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 3. Mild-to-moderate distal branch vessel narrowing in the ACA and PCA distributions bilaterally. Electronically Signed   By: San Morelle M.D.   On: 11/08/2015 10:10    Medications:  I have reviewed the patient's current medications. Scheduled: . amLODipine  5 mg Oral Daily  . anastrozole  1 mg Oral QPM  . artificial tears   Both Eyes 3 times per day  . atorvastatin  40 mg Oral q1800  . [START ON 11/10/2015] citalopram  20 mg Oral Daily  . folic acid  1 mg Oral Daily  . gabapentin  100 mg Oral BID  . metoprolol tartrate  25 mg Oral BID  . multivitamin with minerals  1 tablet Oral Daily  . nicotine  21 mg Transdermal Daily  . pantoprazole  40 mg Oral QAC breakfast  . vitamin B-6  25 mg Oral Daily  . vitamin B-12  500 mcg Oral Daily  . warfarin  3 mg Oral q1800  . Warfarin - Pharmacist Dosing Inpatient   Does not apply q1800    Assessment/Plan: Patient unchanged.  No new complaints.  INR remains subtherapeutic.  B12, folate, ESR, CRP normal.  Remaining lab work is pending.    Recommendations: 1.  Continue therapy 2.  INR goal 2-3.     LOS: 2 days   Alexis Goodell, MD Neurology [phone] 11/09/2015  10:44 AM

## 2015-11-09 NOTE — Progress Notes (Signed)
Occupational Therapy Treatment Patient Details Name: Tina Sharp MRN: [id number] DOB: 01-Jul-1966 Today's Date: 11/09/2015    History of present illness Pt. is a 50 y.o. Female admitted to hospital for R CVA (parieto-occipital and cerebral). Pt. has hx of cancer, HTN, depression, anxiety, and multiple strokes. Most recent stroke prior to present was 08/18/15. Pt has hx of falls. Recent B LE weakness has led to falls and difficulty w/ambulation. Pt had to stop working 09/14/15 due to weakness/pain in hands.    OT comments  Pt. Continues to present with Bilateral UE incoordination which interferes with her ability to complete basic ADL and Self-care tasks including basic 1 step hand to face/mouth patterns for self-feeding, and grooming tasks. Pt. Continues to benefit from skilled OT services to improve ADL performance skills, and work towards returning to her PLOF.     Follow Up Recommendations  SNF    Equipment Recommendations  Tub/shower seat    Recommendations for Other Services      Precautions / Restrictions Precautions Precautions: Fall Restrictions Weight Bearing Restrictions: No       Mobility Bed Mobility                  Transfers                      Balance                                   ADL   Eating/Feeding: Maximal assistance for cup drinking.   Grooming: Brushing hair MaxA with hand-over hand Assist.                                 General ADL Comments: Reviewed strategies for stabilizing items on dycem with the left hand while using her right hand to open bottle tops in preparation for self-grooming tasks. Pt. ed.was provided about stabilizing/supporting UEs proximally to attempt more control distally.        Vision                     Perception     Praxis      Cognition                             Extremity/Trunk Assessment               Exercises     Shoulder  Instructions       General Comments      Pertinent Vitals/ Pain          Home Living                                          Prior Functioning/Environment              Frequency Min 1X/week     Progress Toward Goals  OT Goals(current goals can now be found in the care plan section)     Acute Rehab OT Goals Patient Stated Goal: Return to being independent with ADLs OT Goal Formulation: With patient/family Time For Goal Achievement: 11/22/15 Potential to Achieve Goals: Good  Plan Discharge plan remains appropriate    Co-evaluation  End of Session     Activity Tolerance Patient limited by fatigue;Other (comment) (Pt. is limited by incoordination of BUEs.)   Patient Left in bed;with call bell/phone within reach;with bed alarm set;with family/visitor present   Nurse Communication          Time: [id number] OT Time Calculation (min): 25 min  Charges: OT Treatments $Self Care/Home Management : 23-37 mins  Tina Carina, MS, OTR/L  Tina Sharp 11/09/2015, 11:29 AM

## 2015-11-10 ENCOUNTER — Encounter: Payer: Managed Care, Other (non HMO) | Admitting: Diagnostic Neuroimaging

## 2015-11-10 LAB — HEAVY METALS, BLOOD
ARSENIC: 3 ug/L (ref 2–23)
Lead: 2 ug/dL (ref 0–19)
Mercury: NOT DETECTED ug/L (ref 0.0–14.9)

## 2015-11-10 LAB — CBC
HCT: 38.8 % (ref 35.0–47.0)
HEMOGLOBIN: 12.9 g/dL (ref 12.0–16.0)
MCH: 35 pg — AB (ref 26.0–34.0)
MCHC: 33.2 g/dL (ref 32.0–36.0)
MCV: 105.4 fL — AB (ref 80.0–100.0)
Platelets: 251 10*3/uL (ref 150–440)
RBC: 3.68 MIL/uL — AB (ref 3.80–5.20)
RDW: 17.7 % — ABNORMAL HIGH (ref 11.5–14.5)
WBC: 8.8 10*3/uL (ref 3.6–11.0)

## 2015-11-10 LAB — PROTIME-INR
INR: 1.68
Prothrombin Time: 19.8 seconds — ABNORMAL HIGH (ref 11.4–15.0)

## 2015-11-10 LAB — VITAMIN B1: Vitamin B1 (Thiamine): 79.5 nmol/L (ref 66.5–200.0)

## 2015-11-10 LAB — CERULOPLASMIN: CERULOPLASMIN: 24.6 mg/dL (ref 19.0–39.0)

## 2015-11-10 LAB — VITAMIN E: Alpha-Tocopherol: 8.1 mg/L (ref 5.3–16.8)

## 2015-11-10 LAB — VITAMIN D 25 HYDROXY (VIT D DEFICIENCY, FRACTURES): Vit D, 25-Hydroxy: 6.7 ng/mL — ABNORMAL LOW (ref 30.0–100.0)

## 2015-11-10 LAB — ZINC: Zinc: 52 ug/dL — ABNORMAL LOW (ref 56–134)

## 2015-11-10 MED ORDER — MORPHINE SULFATE 15 MG PO TABS
15.0000 mg | ORAL_TABLET | Freq: Once | ORAL | Status: AC
  Administered 2015-11-10: 09:00:00 15 mg via ORAL
  Filled 2015-11-10: qty 1

## 2015-11-10 MED ORDER — INFLUENZA VAC SPLIT QUAD 0.5 ML IM SUSY: 0.5000 mL | PREFILLED_SYRINGE | INTRAMUSCULAR | Status: DC

## 2015-11-10 MED ORDER — ENSURE ENLIVE PO LIQD: 237.0000 mL | Freq: Two times a day (BID) | ORAL | Status: DC

## 2015-11-10 MED ORDER — VITAMIN D (ERGOCALCIFEROL) 1.25 MG (50000 UNIT) PO CAPS: 50000.0000 [IU] | ORAL_CAPSULE | ORAL | Status: DC

## 2015-11-10 MED ORDER — VITAMIN D (ERGOCALCIFEROL) 1.25 MG (50000 UNIT) PO CAPS
50000.0000 [IU] | ORAL_CAPSULE | ORAL | Status: AC
  Administered 2015-11-10: 15:00:00 50000 [IU] via ORAL
  Filled 2015-11-10: qty 1

## 2015-11-10 MED ORDER — INFLUENZA VAC SPLIT QUAD 0.5 ML IM SUSY
0.5000 mL | PREFILLED_SYRINGE | INTRAMUSCULAR | Status: AC
  Administered 2015-11-10: 12:00:00 0.5 mL via INTRAMUSCULAR
  Filled 2015-11-10 (×2): qty 0.5

## 2015-11-10 NOTE — Progress Notes (Signed)
Subjective: Patient with no new complaints.    Objective: Current vital signs: BP 134/83 mmHg  Pulse 95  Temp(Src) 98 F (36.7 C) (Oral)  Resp 18  Ht 5\' 2"  (1.575 m)  Wt 65.772 kg (145 lb)  BMI 26.51 kg/m2  SpO2 98% Vital signs in last 24 hours: Temp:  [97.6 F (36.4 C)-98.5 F (36.9 C)] 98 F (36.7 C) (03/30 0800) Pulse Rate:  [89-102] 95 (03/30 0800) Resp:  [18-20] 18 (03/30 0800) BP: (133-151)/(83-99) 134/83 mmHg (03/30 0800) SpO2:  [81 %-98 %] 98 % (03/30 0800)  Intake/Output from previous day: 03/29 0701 - 03/30 0700 In: 480 [P.O.:480] Out: -  Intake/Output this shift: Total I/O In: -  Out: 550 [Urine:550] Nutritional status: DIET DYS 3 Room service appropriate?: Yes with Assist; Fluid consistency:: Thin Diet - low sodium heart healthy  Neurologic Exam: Mental Status: Alert, oriented, thought content appropriate. Speech fluent without evidence of aphasia. Able to follow 3 step commands without difficulty. Cranial Nerves: II: Discs flat bilaterally; Visual fields grossly normal, pupils equal, round, reactive to light and accommodation III,IV, VI: ptosis not present, extra-ocular motions intact bilaterally V,VII: smile symmetric, facial light touch sensation normal bilaterally VIII: hearing normal bilaterally IX,X: gag reflex present XI: bilateral shoulder shrug XII: midline tongue extension Motor: 4+-5-/5 throughout with patient somewhat weaker on the left. No drift noted.  Cerebellar: Dysmetria with finger-to-nose and heel-to-shin testing bilaterally (left greater than right)    Lab Results: Basic Metabolic Panel:  Recent Labs Lab 11/04/15 1057 11/07/15 1325 11/08/15 1615 11/09/15 0419  NA 133* 134* 132* 133*  K 2.9* 2.9* 4.0 3.7  CL 96* 100* 98* 98*  CO2 24 20* 24 28  GLUCOSE 79 91 84 93  BUN 8 6 11 8   CREATININE 0.56 0.53 0.49 0.46  CALCIUM 8.0* 7.7* 8.1* 7.9*    Liver Function Tests:  Recent Labs Lab 11/04/15 1057 11/07/15 1325   AST 45* 57*  ALT 23 28  ALKPHOS 78 79  BILITOT 1.3* 1.3*  PROT 5.6* 5.5*  ALBUMIN 2.6* 2.5*   No results for input(s): LIPASE, AMYLASE in the last 168 hours. No results for input(s): AMMONIA in the last 168 hours.  CBC:  Recent Labs Lab 11/04/15 1057 11/07/15 1325 11/10/15 0439  WBC 10.5 5.8 8.8  NEUTROABS  --  3.9  --   HGB 14.0 13.8 12.9  HCT 39.6 39.3 38.8  MCV 100.5* 101.7* 105.4*  PLT 283 197 251    Cardiac Enzymes:  Recent Labs Lab 11/04/15 1057  CKTOTAL 225    Lipid Panel:  Recent Labs Lab 11/08/15 0439  CHOL 158  TRIG 73  HDL 70  CHOLHDL 2.3  VLDL 15  LDLCALC 73    CBG: No results for input(s): GLUCAP in the last 168 hours.  Microbiology: Results for orders placed or performed during the hospital encounter of 06/11/15  MRSA PCR Screening     Status: None   Collection Time: 06/11/15  4:20 PM  Result Value Ref Range Status   MRSA by PCR NEGATIVE NEGATIVE Final    Comment:        The GeneXpert MRSA Assay (FDA approved for NASAL specimens only), is one component of a comprehensive MRSA colonization surveillance program. It is not intended to diagnose MRSA infection nor to guide or monitor treatment for MRSA infections.     Coagulation Studies:  Recent Labs  11/07/15 1730 11/08/15 1238 11/09/15 0419 11/10/15 0439  LABPROT 16.8* 15.5* 16.0* 19.8*  INR 1.35  1.21 1.27 1.68    Imaging: No results found.  Medications:  I have reviewed the patient's current medications. Scheduled: . amLODipine  5 mg Oral Daily  . anastrozole  1 mg Oral QPM  . artificial tears   Both Eyes 3 times per day  . atorvastatin  40 mg Oral q1800  . citalopram  20 mg Oral Daily  . feeding supplement (ENSURE ENLIVE)  237 mL Oral BID BM  . folic acid  1 mg Oral Daily  . gabapentin  100 mg Oral BID  . [START ON 11/11/2015] Influenza vac split quadrivalent PF  0.5 mL Intramuscular Tomorrow-1000  . metoprolol tartrate  25 mg Oral BID  . multivitamin with  minerals  1 tablet Oral Daily  . nicotine  21 mg Transdermal Daily  . pantoprazole  40 mg Oral QAC breakfast  . vitamin B-6  25 mg Oral Daily  . vitamin B-12  500 mcg Oral Daily  . warfarin  3 mg Oral q1800  . Warfarin - Pharmacist Dosing Inpatient   Does not apply q1800    Assessment/Plan: Patient unchanged.  Lab work reveals a low Vit D at 6.7.  Zinc only mildly low at 52.  Vitamin E, ceruloplasmin, B1, heavy metal screen are normal.  LDL 73.  INR 1.68.  Recommendations: 1.  Target INR 2-3. 2.  Vitamin D supplementation 3.  Continue therapy   LOS: 3 days   Alexis Goodell, MD Neurology [phone] 11/10/2015  10:46 AM

## 2015-11-10 NOTE — Progress Notes (Signed)
Received MD order to discharge patient to [facility], called report to Ya[facility]uthrie LPN [facility]. Patient discharged with family member who is driving her to [facility]

## 2015-11-10 NOTE — Clinical Social Work Note (Signed)
Pt is ready for discharge today and will go to [facility]. [facility] Josem Kaufmann has been obtained. CSW sent discharge information to facility. Pt and family are aware and agreeable to discharge plan. Pt's husband will provide transportation. RN called report. CSW is signing off as no further needs identified.   Darden Dates, MSW, LCSW  Clinical Social Worker [phone]

## 2015-11-10 NOTE — Progress Notes (Signed)
ANTICOAGULATION CONSULT NOTE - Initial Consult  Pharmacy Consult for Warfarin Indication: stroke  No Known Allergies  Patient Measurements: Height: 5\' 2"  (157.5 cm) Weight:  (134.7) IBW/kg (Calculated) : 50.1   Vital Signs: Temp: 97.6 F (36.4 C) (03/30 0554) Temp Source: Oral (03/30 0554) BP: 133/84 mmHg (03/30 0554) Pulse Rate: 95 (03/30 0554)  Labs:  Recent Labs  11/07/15 1325  11/07/15 1730 11/08/15 1238 11/08/15 1615 11/09/15 0419 11/10/15 0439  HGB 13.8  --   --   --   --   --  12.9  HCT 39.3  --   --   --   --   --  38.8  PLT 197  --   --   --   --   --  251  APTT  --   --  28  --   --   --   --   LABPROT  --   < > 16.8* 15.5*  --  16.0* 19.8*  INR  --   < > 1.35 1.21  --  1.27 1.68  CREATININE 0.53  --   --   --  0.49 0.46  --   < > = values in this interval not displayed.  Estimated Creatinine Clearance: 73.2 mL/min (by C-G formula based on Cr of 0.46).    Assessment: 50 yo female admitted with recurrent stroke and subtherapeutic INR. Spoke with pt on 3/28, pt states she takes half a 5 mg tablet (=2.5 mg) daily, last dose night before she came in (=3/26).   3/27 INR 1.35, Hgb 13.8, Plt 197 -  Appears no warfarin was given 3/28 INR 1.21 Warfarin 5mg  3/29 INR 1.27 Warfarin 3mg  3/23 INR 1.68   Goal of Therapy:  INR 2-3 Monitor platelets by anticoagulation protocol: Yes   Plan:  Continue warfarin 3mg  daily. Pt expected to d/c today to SNF. Will check INR in the AM if pt is still here.   Pharmacy will continue to follow.    Gilberto Stanforth D Keelin Neville 11/10/2015,8:10 AM

## 2015-11-10 NOTE — Clinical Social Work Placement (Signed)
   CLINICAL SOCIAL WORK PLACEMENT  NOTE  Date:  11/10/2015  Patient Details  Name: Tina Sharp MRN: [id number] Date of Birth: 1965/09/11  Clinical Social Work is seeking post-discharge placement for this patient at the [facility] level of care (*CSW will initial, date and re-position this form in  chart as items are completed):  Yes   Patient/family provided with [facility] Work Department's list of facilities offering this level of care within the geographic area requested by the patient (or if unable, by the patient's family).  Yes   Patient/family informed of their freedom to choose among providers that offer the needed level of care, that participate in Medicare, Medicaid or managed care program needed by the patient, have an available bed and are willing to accept the patient.  Yes   Patient/family informed of [facility]'s ownership interest in [facility] and [facility], as well as of the fact that they are under no obligation to receive care at these facilities.  PASRR submitted to EDS on 11/09/15     PASRR number received on 11/09/15     Existing PASRR number confirmed on       FL2 transmitted to all facilities in geographic area requested by pt/family on 11/08/15     FL2 transmitted to all facilities within larger geographic area on       Patient informed that his/her managed care company has contracts with or will negotiate with certain facilities, including the following:        Yes   Patient/family informed of bed offers received.  Patient chooses bed at [facility]     Physician recommends and patient chooses bed at  [facility])    Patient to be transferred to [facility] [place] on 11/10/15.  Patient to be transferred to facility by Pt's family      Patient family notified on 11/10/15 of transfer.  Name of family member notified:  Pt and parents     PHYSICIAN       Additional Comment:     _______________________________________________ Darden Dates, LCSW 11/10/2015, 4:55 PM

## 2015-11-10 NOTE — Progress Notes (Cosign Needed)
Speech Language Pathology Treatment: Dysphagia  Patient Details Name: Tina Sharp MRN: [id number] DOB: 19-Jan-1966 Today's Date: 11/10/2015 Time: 1000-1045 SLP Time Calculation (min) (ACUTE ONLY): 45 min  Assessment / Plan / Recommendation Clinical Impression  Pt appears to adequately tolerate trials of thin liquids and soft solids/purees w/ no immediate, overt s/s of aspiration; a delayed, mild throat clear was noted x1 post multiple sips of thin liquids. Education was given on general aspiration precautions. Timeliness of oral phase management and clearing continues to be challenged by the decreased coordinated lingual/labial movements during mastication. Pt's overall status appears to be improving per pt/family and MD report; some of pt's deficits are being linked to her B12 deficiency per pt/family. Thorough education was given on precautions; diet consistency and prep(pt did not want to downgrade diet consistency at this time); food options; dietary supplement and feeding strategies including lingual sweeping and oral cleaning/clearing post meals d/t decreased lingual/oral coordination in fluid movements(strength/ROM appropriate in single movements). Again, deficits are being linked to the B12 deficiency per pt/family; pt does have late subacute infarcts in the right cerebral hemisphere. Will consult MD for further poc. Pt appears at reduced risk for aspiration following aspiration precautions and choosing her foods in her diet carefully; attending to the oral phase management of boluses. Rec. f/u w/ Dietician for nutritional supplements; ST services for f/u w/ poc. NSG and pt/family agreed.   HPI HPI: Pt is a 50 y.o. female with a known history of crytogenic CVA who has chronic lower extremity weakness, HTN, Anxiety, Depression, GERD who has been seen in the emergency room last week few times for falls and weakness. Was seen again with the same complaints. The ED physician did an MRI of her brain  which shows small area of acute to early subacute infarct in the right parieto-occipital region. So we're asked to admit the patient for acute CVA. She is on chronic Coumadin therapy her INR is subtherapeutic. Patient also complains of weakness in both of her hands. And difficulty with ambulation. She is required full assistance to ambulate by family members. She denies any swallowing difficulties or visual difficulties. Per MD, pt has been having increased difficulty coordinating upper extremity movements and reports she is unable to feed herself.Patient on Coumadin; admitted with subtherapeutic INR.Pt is awake, verbally conversive, however, pt appears to speak w/ a more closed mouth; limited ROM of the mandible thus limiting lingual ROM for speech although articulation was fair-good. Pt was A/Ox3; followed all instructions. Pt has been tolerating at mech soft diet rec'd but continues to have some oral phase (mastication and bolus management) deficits w/ increased textures of breads and Meats per NSG staff; although, says she is "fine". Articulation and oral movements do appear improved vs at BSE; family present and pt state same.       SLP Plan  Continue with current plan of care;Consult other service (comment) (Dietician)     Recommendations  Diet recommendations: Dysphagia 3 (mechanical soft);Thin liquid (addition of more puree foods in her diet; moistened foods) Liquids provided via: Cup;Straw Medication Administration: Whole meds with puree (or crushed as nec. in puree) Supervision: Staff to assist with self feeding;Intermittent supervision to cue for compensatory strategies Compensations: Minimize environmental distractions;Slow rate;Small sips/bites;Follow solids with liquid;Lingual sweep for clearance of pocketing Postural Changes and/or Swallow Maneuvers: Seated upright 90 degrees             General recommendations:  (Dietician) Oral Care Recommendations: Oral care BID;Staff/trained  caregiver to provide oral  care Follow up Recommendations: Skilled Nursing facility Plan: Continue with current plan of care;Consult other service (comment) (Dietician)     [place], MS, CCC-SLP  Watson,Katherine 11/10/2015, 12:09 PM

## 2015-11-11 ENCOUNTER — Ambulatory Visit (INDEPENDENT_AMBULATORY_CARE_PROVIDER_SITE_OTHER): Payer: Managed Care, Other (non HMO) | Admitting: *Deleted

## 2015-11-11 ENCOUNTER — Encounter: Payer: Self-pay | Admitting: Diagnostic Neuroimaging

## 2015-11-11 DIAGNOSIS — I639 Cerebral infarction, unspecified: Secondary | ICD-10-CM | POA: Diagnosis not present

## 2015-11-11 NOTE — Progress Notes (Signed)
Carelink Summary Report / Loop Recorder 

## 2015-11-15 ENCOUNTER — Encounter: Payer: Self-pay | Admitting: *Deleted

## 2015-11-16 ENCOUNTER — Inpatient Hospital Stay: Payer: Managed Care, Other (non HMO)

## 2015-11-16 ENCOUNTER — Inpatient Hospital Stay: Payer: Managed Care, Other (non HMO) | Admitting: Internal Medicine

## 2015-12-05 ENCOUNTER — Inpatient Hospital Stay: Payer: Managed Care, Other (non HMO)

## 2015-12-05 ENCOUNTER — Inpatient Hospital Stay: Payer: Managed Care, Other (non HMO) | Admitting: Internal Medicine

## 2015-12-12 ENCOUNTER — Telehealth: Payer: Self-pay | Admitting: Neurology

## 2015-12-12 ENCOUNTER — Ambulatory Visit (INDEPENDENT_AMBULATORY_CARE_PROVIDER_SITE_OTHER): Payer: Managed Care, Other (non HMO) | Admitting: *Deleted

## 2015-12-12 DIAGNOSIS — I639 Cerebral infarction, unspecified: Secondary | ICD-10-CM

## 2015-12-12 NOTE — Progress Notes (Signed)
Carelink Summary Report / Loop Recorder 

## 2015-12-12 NOTE — Telephone Encounter (Signed)
Pt called said she fell on 11/04/15 and went to [facility]. She had MRI of pelvic and was neg for anything. She went back on 11/07/15 because she could not walk, hands were jerking, inability to use her hands. She had MRI and was dx with mini strokes. She is in rehab at [facility] since leaving the hospital. She was requesting to be seen sooner but there was not an accomadating appt that she could come to. Patient was then satisfied to wait.

## 2015-12-13 NOTE — Telephone Encounter (Signed)
Patient was sent a letter regarding her appt on May 23,2017 at 1130 with Dr.Sethi.

## 2015-12-13 NOTE — Telephone Encounter (Signed)
LFt vm for patient to confirm appt with Dr.Sethi and cancellation with Megan(NP)..

## 2015-12-13 NOTE — Telephone Encounter (Signed)
LFt vm that appt with Megan on 12/27/2015 will be cancel. LFT vm that she is schedule with Dr.Sethi on Jan 03, 2016 Tuesday at 1130. Pt was recently in the hospital at [facility] in March for another stroke. PT needs to call back to verify.

## 2015-12-14 NOTE — Telephone Encounter (Signed)
LFt vm for patient to confirm appt with Dr.Sethi on May 23,2017. Vm also stated appt with Megan(NP) was cancel. Pt was recently in the hospital again for stroke.

## 2015-12-19 ENCOUNTER — Telehealth: Payer: Self-pay | Admitting: *Deleted

## 2015-12-21 ENCOUNTER — Inpatient Hospital Stay: Payer: Managed Care, Other (non HMO)

## 2015-12-21 ENCOUNTER — Inpatient Hospital Stay: Payer: Managed Care, Other (non HMO) | Admitting: Internal Medicine

## 2015-12-26 NOTE — Telephone Encounter (Signed)
K[facility]   Kaeya Wacker  [phone] * [address] APPOINTMENT 5/23 IN THIS OFFICE?   I called Kim back and confirmed appt date , time, location. She expressed understanding.

## 2015-12-27 ENCOUNTER — Ambulatory Visit: Payer: Managed Care, Other (non HMO) | Admitting: Adult Health

## 2016-01-03 ENCOUNTER — Ambulatory Visit (INDEPENDENT_AMBULATORY_CARE_PROVIDER_SITE_OTHER): Payer: Managed Care, Other (non HMO) | Admitting: Neurology

## 2016-01-03 ENCOUNTER — Encounter: Payer: Self-pay | Admitting: Neurology

## 2016-01-03 VITALS — BP 100/72 | HR 72 | Ht 62.0 in | Wt 116.0 lb

## 2016-01-03 DIAGNOSIS — R270 Ataxia, unspecified: Secondary | ICD-10-CM | POA: Diagnosis not present

## 2016-01-03 DIAGNOSIS — G629 Polyneuropathy, unspecified: Secondary | ICD-10-CM

## 2016-01-03 NOTE — Patient Instructions (Signed)
I had a long discussion with the patient and her parents regarding her recent strokes and gait ataxia and likely underlying peripheral neuropathy and answered questions. Continue warfarin for stroke prevention given her hypercoagulability related to cancer and multiple strokes. Continue strict control of hypertension with blood pressure goal below 130/90 and lipids with LDL cholesterol goal below 70 mg percent. Patient also counseled to quit smoking. Check EMG nerve conduction study for neuropathy and continue vitamin B12 replacement. Continue ongoing physical occupational therapy for gait and balance training. Return for follow-up in 3 months or call earlier if necessary.

## 2016-01-03 NOTE — Progress Notes (Signed)
STROKE NEUROLOGY FOLLOW UP NOTE  NAME: Tina Sharp DOB: Jun 12, 1966  REASON FOR VISIT: stroke follow up HISTORY FROM: pt and chart  Today we had Tina pleasure of seeing Tina Sharp in follow-up at our Neurology Clinic. Pt was accompanied by mom.   History Summary Tina Sharp is a 50 y.o. female with history of GERD and invasive lobular breast carcinoma was admitted on 06/11/15 for acute onset of numbness  and left hemiparesis and headache with photophobia. She received IV TPA at [facility] and was transferred to [facility]. Her symptoms improved. 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 MRA showed irregular right M3 and M4 branches with right fetal PCA. CUS showed right ICA 40-59% stenosis. TTE and TEE unremarkable and loop recorder placed. LDL 65 and A1C 5.4 with negative hypercoagulable work up except homocysteine level 36.2. She was discharged with ASA 325mg .  She was readmitted on 06/21/15 for worsening left sided symptoms. MRI negative for new strokes but CTA head and neck showed right ICA thrombus. She was put on anticoagulation coumadin with lovenox bridge.  Discharged in good condition with outpt PT/OT. 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   Follow up 07/12/15 - Tina Sharp has been doing well. No recurrent stroke like symptoms. Continue following with outpt PT/OT and left hand getting better but still difficulty with fine motor activity. Still on coumadin but recently decreased dose due to INR.> 5.0. Loop recorder no afib found so far. Still not quit smoking yet. BP 152/93 today but pt stated that BP at home 120-140s. Currently has PT/OT twice a week.   Last visit  09/27/15 :   She is seen today for follow-up and she called complaining of tingling numbness in her fingertips as well as tiredness. She had a follow-up CT angiogram done on 09/15/14 which I have personally reviewed and shows  significant resolution of Tina right carotid clot with only small remnants remaining with excellent flow. Sharp states her blood pressure usually well controlled but she has had some hot flashes today and hence it is elevated in office at 163/112. She is doing outpatient physical occupational therapy and feels her walking has improved. She is being treated for depression by primary care physician and feels her medications working and she is doing much better. She is scheduled to undergo electrical study possible no conduction Week. She has been denied disability and I advised her that from Tina stroke standpoint she clearly does not have enough physical deficits to justify. Update 01/03/2016 : Sharp is seen for follow-up after last visit to 3 months ago. Tina Sharp was hospitalized at [facility] on 3/59/17. She is accompanied by her parents today. Sharp developed increasing weakness and ataxia. MRI scan of Tina brain showed a small right occipital parietal infarcts as well as several late subacute infarcts in Tina right frontal and parietal areas as well. which were felt to be of cryptogenic etiology perhaps related to hypercoagulability from her breast cancer. MRA of Tina brain showed no significant large vessel stenosis or occlusion. MRI scan cervical spine showed broad-based posterior disc osteophyte complex at C5-6 resulting in mild foraminal narrowing and mild spinal stenosis at C6-7. MRI lumbar spine also showed only mild disc degenerative changes. She was continued on warfarin. Sharp has had significant trouble with walking and balance since then. She is currently in [facility] skilled nursing facility in [place].  She is barely able to walk even with Tina 2 physical therapists. She reports generalized weakness with incoordination as well. She can also complains of tingling and numbness in both hands as well as feet. She was found to have low vitamin B12 for which she is  getting replacement therapy but her paresthesias and balance continue. REVIEW OF SYSTEMS: Full 14 system review of systems performed and notable only for those listed below and in HPI above, all others are negative:   Tingling numbness tightness, depression, gait difficulty, weakness and all other systems negative Tina following represents Tina Sharp's updated allergies and side effects list: No Known Allergies  Tina neurologically relevant items on Tina Sharp's problem list were reviewed on today's visit.  Neurologic Examination  A problem focused neurological exam (12 or more points of Tina single system neurologic examination, vital signs counts as 1 point, cranial nerves count for 8 points) was performed.  Blood pressure 100/72, pulse 72, height 5\' 2"  (1.575 m), weight 116 lb (52.617 kg).  General - Well nourished, well developed, in no apparent distress.  Ophthalmologic - funduscopic exam not done.   Cardiovascular - Regular rate and rhythm with no murmur.  Mental Status -  Level of arousal and orientation to time, place, and person were intact. Language including expression, naming, repetition, comprehension was assessed and found intact. Fund of Knowledge was assessed and was intact.  Cranial Nerves II - XII - II - Visual field intact OU. III, IV, VI - Extraocular movements intact. V - Facial sensation intact bilaterally. VII - Facial movement intact bilaterally. VIII - Hearing & vestibular intact bilaterally. X - Palate elevates symmetrically. XI - Chin turning & shoulder shrug intact bilaterally. XII - Tongue protrusion intact.  Motor Strength - Tina Sharp's strength was normal in upper extremities except mild left hand decreased grip and dexterity difficulty and pronator drift was absent. 4/5 bilateral LE proximally, but 5/5 distally.    Bulk was normal and fasciculations were absent.   Motor Tone - Muscle tone was assessed at Tina neck and appendages and was  normal.  Reflexes - Tina Sharp's reflexes were 2+ in all extremities include patellar and ankle reflex and she had no pathological reflexes.  Sensory - Light touch, temperature/pinprick were assessed and were normal except bilateral anterior thigh decreased touch and pinprick.    Coordination - Tina Sharp had   Mild left finger to nose and bilateral knee to heel dysmetria.  Tremor was absent.  Gait and Station - deferred as Sharp unable even with physical therapy help  Data reviewed: MRI Brain 11/07/15 : 1. Small area of acute/early subacute infarct in Tina right parieto-occipital region. 2. Multiple late subacute infarcts in Tina right cerebral hemisphere as previously demonstrated MRA Brain wo 11/08/15 : 1. No significant proximal stenosis, aneurysm, or branch vessel occlusion. 2. 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 3. Mild-to-moderate distal branch vessel narrowing in Tina ACA and PCA distributions bilaterally. MRI Cervical Spine wo 11/07/2015 : 1. Mildly to moderately motion degraded examination. 2. Mild spinal stenosis and mild bilateral neural foraminal stenosis at C6-7. 3. Mild bilateral foraminal stenosis at C5-6. Carotid Dopplers 11/08/15 : 1. Mild (1-49%) stenosis proximal right internal carotid artery secondary to trace smooth hypoechoic atherosclerotic plaque. 2. Mild (1-49%) stenosis proximal left internal carotid artery secondary to heterogenous atherosclerotic plaque. 3. Vertebral arteries are patent with normal antegrade flow         06/22/2015  41.8     Comment     Comment     Comment                                                  5.4     108                         179 (L)                                                  3.9 (L)     Assessment: As you may recall, she is a 50 y.o. Caucasian female with PMH of GERD and invasive lobular breast  carcinoma was admitted on 06/11/15 for scattered right MCA and PCA infarcts. She received tPA.  She was readmitted on 06/21/15 for worsening left sided symptoms. MRI negative for new strokes but CTA head and neck showed right ICA thrombus. 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 Still not quit smoking yet.new to my practice admission in March 2017 for new right frontal and pareital embolic infarcts of cryptogenic etiology despite anticoagulation. 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 Sharp's inability to walk seems out of proportion to size and location of her recent strokes and alternative contributing conditions like underlying neuropathy need to be explored. 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 Continue warfarin for stroke prevention given her hypercoagulability related to cancer and multiple strokes. Continue strict control of hypertension with blood pressure goal below 130/90 and lipids with LDL cholesterol goal below 70 mg percent. Sharp also counseled to quit smoking. Check EMG nerve conduction study for neuropathy and continue vitamin B12 replacement. Continue ongoing physical occupational therapy for gait and balance training.I spent more than 25 minutes of face to face time with Tina Sharp. Greater than 50% of time was spent in counseling and coordination of care.   Return for follow-up in 3 months or call earlier if necessary.        Antony Contras, MD       [facility] [address] [place], [place] [address] [phone]

## 2016-01-05 ENCOUNTER — Telehealth: Payer: Self-pay | Admitting: *Deleted

## 2016-01-05 NOTE — Telephone Encounter (Signed)
Taken 25-MAY-17 at  1:14PM by [facility] ------------------------------------------------------------ Tina Sharp              CID [id number]  Patient SAME                 Pt's Dr Leonie Man        Area Code 336 Phone# [phone] * DOB 1 [birth year] [place] OF HER APPT     ON MAY 28TH, PLEASE CALL                             Disp:Y/N N If Y = C/B If No Response In 8minutes ============================================================ Lt voicemail, pt to call the office. ds

## 2016-01-07 LAB — CUP PACEART REMOTE DEVICE CHECK: MDC IDC SESS DTM: 20170331154014

## 2016-01-07 NOTE — Progress Notes (Signed)
[facility] summary report received. Battery status OK. Normal device function. No new symptom episodes, tachy episodes, brady, or pause episodes. No new AF episodes. Monthly summary reports and ROV/PRN 

## 2016-01-10 ENCOUNTER — Ambulatory Visit (INDEPENDENT_AMBULATORY_CARE_PROVIDER_SITE_OTHER): Payer: Managed Care, Other (non HMO) | Admitting: *Deleted

## 2016-01-10 DIAGNOSIS — I639 Cerebral infarction, unspecified: Secondary | ICD-10-CM

## 2016-01-11 NOTE — Progress Notes (Signed)
Carelink Summary Report / Loop Recorder 

## 2016-01-21 LAB — CUP PACEART REMOTE DEVICE CHECK: Date Time Interrogation Session: 20170430160614

## 2016-01-21 NOTE — Progress Notes (Signed)
[facility] summary report received. Battery status OK. Normal device function. No new symptom episodes, tachy episodes, brady, or pause episodes. No new AF episodes. Monthly summary reports and ROV/PRN 

## 2016-02-08 ENCOUNTER — Encounter: Payer: Managed Care, Other (non HMO) | Admitting: Neurology

## 2016-02-08 ENCOUNTER — Ambulatory Visit: Payer: Managed Care, Other (non HMO) | Admitting: General Surgery

## 2016-02-09 ENCOUNTER — Ambulatory Visit (INDEPENDENT_AMBULATORY_CARE_PROVIDER_SITE_OTHER): Payer: Managed Care, Other (non HMO) | Admitting: *Deleted

## 2016-02-09 DIAGNOSIS — I639 Cerebral infarction, unspecified: Secondary | ICD-10-CM | POA: Diagnosis not present

## 2016-02-09 NOTE — Progress Notes (Signed)
Carelink Summary Report / Loop Recorder 

## 2016-02-17 LAB — CUP PACEART REMOTE DEVICE CHECK: Date Time Interrogation Session: 20170530163613

## 2016-02-21 ENCOUNTER — Emergency Department: Payer: Managed Care, Other (non HMO)

## 2016-02-21 ENCOUNTER — Emergency Department
Admission: EM | Admit: 2016-02-21 | Discharge: 2016-02-21 | Disposition: A | Discharge status: Home / Self Care | Payer: Managed Care, Other (non HMO) | Attending: Emergency Medicine | Admitting: Emergency Medicine

## 2016-02-21 ENCOUNTER — Telehealth: Payer: Self-pay | Admitting: Neurology

## 2016-02-21 ENCOUNTER — Emergency Department (HOSPITAL_COMMUNITY): Payer: Managed Care, Other (non HMO)

## 2016-02-21 ENCOUNTER — Emergency Department (HOSPITAL_COMMUNITY)
Admission: EM | Admit: 2016-02-21 | Discharge: 2016-02-21 | Disposition: A | Discharge status: Home / Self Care | Payer: Managed Care, Other (non HMO) | Attending: Emergency Medicine | Admitting: Emergency Medicine

## 2016-02-21 ENCOUNTER — Encounter (HOSPITAL_COMMUNITY): Payer: Self-pay | Admitting: Emergency Medicine

## 2016-02-21 DIAGNOSIS — Z8673 Personal history of transient ischemic attack (TIA), and cerebral infarction without residual deficits: Secondary | ICD-10-CM | POA: Insufficient documentation

## 2016-02-21 DIAGNOSIS — Y929 Unspecified place or not applicable: Secondary | ICD-10-CM | POA: Insufficient documentation

## 2016-02-21 DIAGNOSIS — W1839XA Other fall on same level, initial encounter: Secondary | ICD-10-CM | POA: Insufficient documentation

## 2016-02-21 DIAGNOSIS — Y939 Activity, unspecified: Secondary | ICD-10-CM | POA: Insufficient documentation

## 2016-02-21 DIAGNOSIS — I10 Essential (primary) hypertension: Secondary | ICD-10-CM | POA: Diagnosis not present

## 2016-02-21 DIAGNOSIS — Z7901 Long term (current) use of anticoagulants: Secondary | ICD-10-CM | POA: Diagnosis not present

## 2016-02-21 DIAGNOSIS — G44309 Post-traumatic headache, unspecified, not intractable: Secondary | ICD-10-CM | POA: Diagnosis not present

## 2016-02-21 DIAGNOSIS — Y999 Unspecified external cause status: Secondary | ICD-10-CM | POA: Insufficient documentation

## 2016-02-21 DIAGNOSIS — Z87891 Personal history of nicotine dependence: Secondary | ICD-10-CM | POA: Insufficient documentation

## 2016-02-21 DIAGNOSIS — F329 Major depressive disorder, single episode, unspecified: Secondary | ICD-10-CM | POA: Diagnosis not present

## 2016-02-21 DIAGNOSIS — F0781 Postconcussional syndrome: Secondary | ICD-10-CM | POA: Diagnosis not present

## 2016-02-21 DIAGNOSIS — R51 Headache: Secondary | ICD-10-CM

## 2016-02-21 DIAGNOSIS — W228XXA Striking against or struck by other objects, initial encounter: Secondary | ICD-10-CM | POA: Diagnosis not present

## 2016-02-21 DIAGNOSIS — Z853 Personal history of malignant neoplasm of breast: Secondary | ICD-10-CM | POA: Insufficient documentation

## 2016-02-21 DIAGNOSIS — S0990XA Unspecified injury of head, initial encounter: Secondary | ICD-10-CM | POA: Diagnosis not present

## 2016-02-21 DIAGNOSIS — W19XXXD Unspecified fall, subsequent encounter: Secondary | ICD-10-CM

## 2016-02-21 DIAGNOSIS — Z79899 Other long term (current) drug therapy: Secondary | ICD-10-CM | POA: Insufficient documentation

## 2016-02-21 DIAGNOSIS — R519 Headache, unspecified: Secondary | ICD-10-CM

## 2016-02-21 LAB — URINALYSIS, ROUTINE W REFLEX MICROSCOPIC
Bilirubin Urine: NEGATIVE
GLUCOSE, UA: NEGATIVE mg/dL
Hgb urine dipstick: NEGATIVE
Ketones, ur: NEGATIVE mg/dL
LEUKOCYTES UA: NEGATIVE
Nitrite: NEGATIVE
PH: 6 (ref 5.0–8.0)
Protein, ur: NEGATIVE mg/dL
Specific Gravity, Urine: 1.015 (ref 1.005–1.030)

## 2016-02-21 LAB — TROPONIN I: Troponin I: <0.03 ng/mL (ref <0.03)

## 2016-02-21 LAB — CBC
HEMATOCRIT: 38.5 % (ref 35.0–47.0)
HEMOGLOBIN: 13 g/dL (ref 12.0–16.0)
MCH: 30.3 pg (ref 26.0–34.0)
MCHC: 33.7 g/dL (ref 32.0–36.0)
MCV: 89.9 fL (ref 80.0–100.0)
PLATELETS: 267 10*3/uL (ref 150–440)
RBC: 4.28 MIL/uL (ref 3.80–5.20)
RDW: 12.8 % (ref 11.5–14.5)
WBC: 6.2 10*3/uL (ref 3.6–11.0)

## 2016-02-21 LAB — COMPREHENSIVE METABOLIC PANEL
ALK PHOS: 102 U/L (ref 38–126)
ALT: 28 U/L (ref 14–54)
AST: 25 U/L (ref 15–41)
Albumin: 3.3 g/dL — ABNORMAL LOW (ref 3.5–5.0)
Anion gap: 4 — ABNORMAL LOW (ref 5–15)
BUN: 9 mg/dL (ref 6–20)
CHLORIDE: 104 mmol/L (ref 101–111)
CO2: 32 mmol/L (ref 22–32)
CREATININE: 0.66 mg/dL (ref 0.44–1.00)
Calcium: 9.1 mg/dL (ref 8.9–10.3)
GFR calc Af Amer: >60 mL/min (ref >60)
Glucose, Bld: 80 mg/dL (ref 65–99)
Potassium: 4.2 mmol/L (ref 3.5–5.1)
Sodium: 140 mmol/L (ref 135–145)
Total Bilirubin: 0.4 mg/dL (ref 0.3–1.2)
Total Protein: 6.4 g/dL — ABNORMAL LOW (ref 6.5–8.1)

## 2016-02-21 LAB — PROTIME-INR
INR: 2.33
PROTHROMBIN TIME: 25.3 s — AB (ref 11.4–15.0)

## 2016-02-21 MED ORDER — SODIUM CHLORIDE 0.9 % IV BOLUS (SEPSIS)
1000.0000 mL | Freq: Once | INTRAVENOUS | Status: AC
  Administered 2016-02-21: 1000 mL via INTRAVENOUS

## 2016-02-21 MED ORDER — BACLOFEN 10 MG PO TABS
10.0000 mg | ORAL_TABLET | Freq: Three times a day (TID) | ORAL | Status: DC
  Administered 2016-02-21: 10 mg via ORAL
  Filled 2016-02-21 (×2): qty 1

## 2016-02-21 MED ORDER — CLONAZEPAM 0.5 MG PO TABS
0.5000 mg | ORAL_TABLET | Freq: Once | ORAL | Status: AC
  Administered 2016-02-21: 0.5 mg via ORAL
  Filled 2016-02-21: qty 1

## 2016-02-21 MED ORDER — GABAPENTIN 100 MG PO CAPS
100.0000 mg | ORAL_CAPSULE | Freq: Once | ORAL | Status: AC
  Administered 2016-02-21: 100 mg via ORAL
  Filled 2016-02-21: qty 1

## 2016-02-21 MED ORDER — TRAMADOL HCL 50 MG PO TABS
50.0000 mg | ORAL_TABLET | Freq: Once | ORAL | Status: AC
  Administered 2016-02-21: 50 mg via ORAL
  Filled 2016-02-21: qty 1

## 2016-02-21 NOTE — ED Notes (Addendum)
Pt arrives to ER via [facility] c/o headache since Sunday after fall (mechanical fall), no LOC or any other injuries. Pt hit head on floor, small hematoma to posterior right side present. "Severe headache" since fall, pt takes Warfarin. Pt arrives with residual weakness since stroke "last year". Pt alert and oriented X4, active, cooperative, pt in NAD. RR even and unlabored, color WNL.

## 2016-02-21 NOTE — Telephone Encounter (Signed)
I called the listed number for Tina Sharp and got an answering machine and left a message to call me back if needed.

## 2016-02-21 NOTE — ED Notes (Signed)
Pt arrives after just leaving [facility] for same symptoms but pt husband states they were unsatisfied with answerers there and would like a second opinion. Pt had a fall on Saturday trying to get out of her chair and fel and hit back of head. Pt denies any LOC but has a small knot of back of head and has had a headache since. Pt states she woke up this am with a worse headache. Pt denies any dizziness no vomiting or nausea. Pt had CT scan at [facility] that was negative for any acute finding PTA here. Pt has paralysis  from waist down from previous stroke per pt. Pt is alert and ox4 with clear speech. No new neuro deficits.

## 2016-02-21 NOTE — ED Notes (Signed)
MD at bedside. 

## 2016-02-21 NOTE — ED Notes (Signed)
Pt last meal was last night, one sip of water this AM and no medications.

## 2016-02-21 NOTE — ED Notes (Signed)
The EKG was completed, signed by Dr. Corky Downs, and exported into the system.

## 2016-02-21 NOTE — Telephone Encounter (Signed)
Rn call patient caregiver [name] at [phone].[name] stated Tina Sharp is in the waiting room and is having a stroke. [name] stated that she thought Dr. Leonie Man could admit the patient because she is ar risk stroke pt. Rn explain Dr.Sethi works at [employer] [facility] [employer]. Rn stated the ED MD makes the determination if patient needs to be admitted.[name] stated it was like 50 people in front of people and she felt like pt should be seen sooner. [name] stated "when my mom was a pt of Dr.Sethi he came to the ED and saw my mom and admitted her, but i guess that's change now". Rn stated a message will be sent to [place].

## 2016-02-21 NOTE — ED Notes (Signed)
Patient transported to CT 

## 2016-02-21 NOTE — ED Notes (Signed)
Patient left at this time with all belongings. 

## 2016-02-21 NOTE — Telephone Encounter (Signed)
Patient's caregiver Fraser Din is calling from the waiting room at [facility].  She has taken the patient in as she has exhibited signs of a stroke and has had several strokes before.  She was initially seen at [facility] and was just told to go home.  The caregiver brought her to [facility] but she has been waiting quite a long time out in the walk in ER area.  The caregiver called very concerned and would like to know if Dr. Leonie Man could speak up on the patient's behalf to have her seen or might be in a position to come down himself?  Please call caregiver [phone].

## 2016-02-21 NOTE — Discharge Instructions (Signed)
Head Injury, Adult °You have a head injury. Headaches and throwing up (vomiting) are common after a head injury. It should be easy to wake up from sleeping. Sometimes you must stay in the hospital. Most problems happen within the first 24 hours. Side effects may occur up to 7-10 days after the injury.  °WHAT ARE THE TYPES OF HEAD INJURIES? °Head injuries can be as minor as a bump. Some head injuries can be more severe. More severe head injuries include: °· A jarring injury to the brain (concussion). °· A bruise of the brain (contusion). This mean there is bleeding in the brain that can cause swelling. °· A cracked skull (skull fracture). °· Bleeding in the brain that collects, clots, and forms a bump (hematoma). °WHEN SHOULD I GET HELP RIGHT AWAY?  °· You are confused or sleepy. °· You cannot be woken up. °· You feel sick to your stomach (nauseous) or keep throwing up (vomiting). °· Your dizziness or unsteadiness is getting worse. °· You have very bad, lasting headaches that are not helped by medicine. Take medicines only as told by your doctor. °· You cannot use your arms or legs like normal. °· You cannot walk. °· You notice changes in the black spots in the center of the colored part of your eye (pupil). °· You have clear or bloody fluid coming from your nose or ears. °· You have trouble seeing. °During the next 24 hours after the injury, you must stay with someone who can watch you. This person should get help right away (call 911 in the [place] if you start to shake and are not able to control it (have seizures), you pass out, or you are unable to wake up. °HOW CAN I PREVENT A HEAD INJURY IN THE FUTURE? °· Wear seat belts. °· Wear a helmet while bike riding and playing sports like football. °· Stay away from dangerous activities around the house. °WHEN CAN I RETURN TO NORMAL ACTIVITIES AND ATHLETICS? °See your doctor before doing these activities. You should not do normal activities or play contact sports until 1  week after the following symptoms have stopped: °· Headache that does not go away. °· Dizziness. °· Poor attention. °· Confusion. °· Memory problems. °· Sickness to your stomach or throwing up. °· Tiredness. °· Fussiness. °· Bothered by bright lights or loud noises. °· Anxiousness or depression. °· Restless sleep. °MAKE SURE YOU:  °· Understand these instructions. °· Will watch your condition. °· Will get help right away if you are not doing well or get worse. °  °This information is not intended to replace advice given to you by your health care provider. Make sure you discuss any questions you have with your health care provider. °  °Document Released: 07/12/2008 Document Revised: 08/20/2014 Document Reviewed: 04/06/2013 °Elsevier Interactive Patient Education ©2016 [employer] ° °

## 2016-02-21 NOTE — ED Provider Notes (Signed)
[facility] Emergency Department Provider Note   ____________________________________________    I have reviewed the triage vital signs and the nursing notes.   HISTORY  Chief Complaint Fall and Headache     HPI Tina Sharp is a 50 y.o. female who presents with headache after a head injury 2 days ago. Patient reports she is on Coumadin and had a fall while transferring to a wheelchair, she is wheelchair bound secondary to CVA. This happened 2 days ago she reports she hit the back of her head on the wooden floor. Since then she has had a posterior headache. No new neuro deficits. She complains of throbbing posterior headache. No LOC    Past Medical History  Diagnosis Date  . GERD (gastroesophageal reflux disease)   . Breast cancer [facility]) June 2015    Invasive lobular carcinoma, 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 ER/ PR+; Her 2 neu not overexpressed, microscopic positive margin (skin).  . Hypertension   . Depression   . Anxiety   . 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 Neuropathy ([place]) 01/03/2016  . Ataxia 01/03/2016  . Myalgia 09/08/2015  . Depression 09/06/2015  . Cerebrovascular accident (CVA) due to thrombosis of posterior cerebral artery ([place]) 08/18/2015  . 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 Stroke with cerebral ischemia ([place]) 07/12/2015  . HLD (hyperlipidemia) 07/12/2015  . CVA (cerebral infarction) 06/21/2015  . TIA (transient ischemic attack) 06/21/2015  . Macrocytic anemia 06/20/2015  . Headache 06/14/2015  . Cigarette smoker 06/14/2015  . Stroke (cerebrum) ([place]) cryptogenic R brain infarcts s/p IV tPA  06/11/2015  . Malignant neoplasm of breast (female), unspecified site 04/23/2014  . Breast cancer ([place]) 02/20/2014    Past Surgical History  Procedure Laterality Date  . Laparoscopic hysterectomy  2010    [facility]  .  Breast surgery Left 04/08/14    mastectomy  . Reduction mammoplasty Right 04/08/14    Dr Tula Nakayama  . Colonoscopy    . Ep implantable device N/A 06/14/2015    Procedure: Loop Recorder Insertion;  Surgeon: Evans Lance, MD;  Location: [place] [facility];  Service: Cardiovascular;  Laterality: N/A;  . Tee without cardioversion N/A 06/14/2015    Procedure: TRANSESOPHAGEAL ECHOCARDIOGRAM (TEE);  Surgeon: Josue Hector, MD;  Location: [facility] ENDOSCOPY;  Service: Cardiovascular;  Laterality: N/A;  . Abdominal hysterectomy  2000    left both ovaries in  . Tonsillectomy and adenoidectomy      pt was 50 years old    Current Outpatient Rx  Name  Route  Sig  Dispense  Refill  . amLODipine (NORVASC) 5 MG tablet   Oral   Take 1 tablet (5 mg total) by mouth daily.   30 tablet   2   . anastrozole (ARIMIDEX) 1 MG tablet   Oral   Take 1 tablet (1 mg total) by mouth every evening.   7 tablet   0   . atorvastatin (LIPITOR) 40 MG tablet   Oral   Take 1 tablet (40 mg total) by mouth daily at 6 PM.   30 tablet   2   . b complex vitamins tablet   Oral   Take 1 tablet by mouth daily.   30 tablet   0   . benzocaine (ORAJEL) 10 % mucosal gel   Mouth/Throat   Use as directed in the mouth  or throat 3 (three) times daily as needed for mouth pain (to lip corners).   5.3 g   0   . citalopram (CELEXA) 20 MG tablet   Oral   Take 1 tablet (20 mg total) by mouth daily.   30 tablet   2   . clonazePAM (KLONOPIN) 0.5 MG tablet   Oral   Take 1 tablet (0.5 mg total) by mouth 2 (two) times daily as needed for anxiety.   20 tablet   0   . cyanocobalamin 1000 MCG tablet   Oral   Take 1 tablet (1,000 mcg total) by mouth daily.   30 tablet   2   . esomeprazole (NEXIUM) 20 MG capsule   Oral   Take 20 mg by mouth daily as needed (for heartburn).          . feeding supplement, ENSURE ENLIVE, (ENSURE ENLIVE) LIQD   Oral   Take 237 mLs by mouth 2 (two) times daily between meals.   123XX123 mL   12   .  folic acid (FOLVITE) 1 MG tablet   Oral   Take 1 tablet (1 mg total) by mouth daily.   30 tablet   0   . gabapentin (NEURONTIN) 100 MG capsule   Oral   Take 1 capsule (100 mg total) by mouth 2 (two) times daily.   60 capsule   2   . metoprolol tartrate (LOPRESSOR) 50 MG tablet   Oral   Take 1 tablet (50 mg total) by mouth 2 (two) times daily.   60 tablet   2   . nicotine (NICODERM CQ - DOSED IN MG/24 HOURS) 21 mg/24hr patch   Transdermal   Place 1 patch (21 mg total) onto the skin daily.   28 patch   0   . polyethylene glycol (MIRALAX / GLYCOLAX) packet   Oral   Take 17 g by mouth daily as needed.         . prochlorperazine (COMPAZINE) 5 MG tablet   Oral   Take 5 mg by mouth every 6 (six) hours as needed for nausea or vomiting.         . traMADol (ULTRAM) 50 MG tablet   Oral   Take 1 tablet (50 mg total) by mouth every 6 (six) hours as needed for moderate pain.   20 tablet   0   . vitamin B-6 (PYRIDOXINE) 25 MG tablet   Oral   Take 1 tablet (25 mg total) by mouth daily.   90 tablet   3   . Vitamin D, Ergocalciferol, (DRISDOL) 50000 units CAPS capsule   Oral   Take 1 capsule (50,000 Units total) by mouth every 7 (seven) days.   4 capsule   0   . warfarin (COUMADIN) 3 MG tablet   Oral   Take 1 tablet (3 mg total) by mouth daily at 6 PM.   30 tablet   2     Allergies Review of patient's allergies indicates no known allergies.  Family History  Problem Relation Age of Onset  . Breast cancer Mother 75  . Breast cancer Maternal Grandmother 89  . Cancer Maternal Grandfather 49    prostate  . Prostate cancer Maternal Grandfather   . Ovarian cancer Other   . Seizures Paternal Grandmother     Social History Social History  Substance Use Topics  . Smoking status: Former Smoker -- 0.25 packs/day    Types: Cigarettes    Quit date: 08/13/2013  .  Smokeless tobacco: Never Used     Comment: E cig  . Alcohol Use: No     Comment: social    Review of  Systems  Constitutional: No Dizziness Eyes: No visual changes.  ENT: Mild neck pain Cardiovascular: Denies chest pain. Respiratory: Denies shortness of breath. Gastrointestinal: No nausea or vomiting   Musculoskeletal: Negative for back pain. Mild neck pain as above  Skin: Negative for laceration or abrasion Neurological: Negative for new focal weakness  10-point ROS otherwise negative.  ____________________________________________   PHYSICAL EXAM:  VITAL SIGNS: ED Triage Vitals  Enc Vitals Group     BP 02/21/16 0917 96/71 mmHg     Pulse Rate 02/21/16 0912 63     Resp 02/21/16 0911 18     Temp 02/21/16 0912 97.9 F (36.6 C)     Temp Source 02/21/16 0912 Oral     SpO2 02/21/16 0912 98 %     Weight 02/21/16 0912 117 lb (53.071 kg)     Height 02/21/16 0912 5\' 2"  (1.575 m)     Head Cir --      Peak Flow --      Pain Score 02/21/16 0913 3     Pain Loc --      Pain Edu? --      Excl. in [place]? --     Constitutional: Alert and oriented. No acute distress. Pleasant and interactive Eyes: Conjunctivae are normal.  Head: Normocephalic, Small posterior hematoma  Nose: No congestion/rhinnorhea. Mouth/Throat: Mucous membranes are moist.   Neck: No vertebral tenderness to palpation but mild discomfort with  ROM Cardiovascular: Normal rate, regular rhythm. Grossly normal heart sounds.  Good peripheral circulation. Respiratory: Normal respiratory effort.  No retractions. Lungs CTAB. Gastrointestinal: Soft and nontender. No distention.  No CVA tenderness. Genitourinary: deferred Musculoskeletal: No lower extremity tenderness nor edema.  Warm and well perfused Neurologic:  Normal speech and language. No new focal neurologic deficits are appreciated. Patient has chronic residual weakness Skin:  Skin is warm, dry and intact. No rash noted. Psychiatric: Mood and affect are normal. Speech and behavior are normal.  ____________________________________________   LABS (all labs ordered  are listed, but only abnormal results are displayed)  Labs Reviewed  COMPREHENSIVE METABOLIC PANEL - Abnormal; Notable for the following:    Total Protein 6.4 (*)    Albumin 3.3 (*)    Anion gap 4 (*)    All other components within normal limits  PROTIME-INR - Abnormal; Notable for the following:    Prothrombin Time 25.3 (*)    All other components within normal limits  CBC   ____________________________________________  EKG  ED ECG REPORT I, Lavonia Drafts, the attending physician, personally viewed and interpreted this ECG.  Date: 02/21/2016 EKG Time: 9:14 AM Rate: 64 Rhythm: normal sinus rhythm QRS Axis: normal Intervals: normal ST/T Wave abnormalities: normal Conduction Disturbances: none Narrative Interpretation: unremarkable  ____________________________________________  RADIOLOGY  No acute finding on CT head and CT cervical spine ____________________________________________   PROCEDURES  Procedure(s) performed: No    Critical Care performed:No ____________________________________________   INITIAL IMPRESSION / ASSESSMENT AND PLAN / ED COURSE  Pertinent labs & imaging results that were available during my care of the patient were reviewed by me and considered in my medical decision making (see chart for details).  Patient presents with head injury while on blood thinners. No new focal deficits. We'll obtain CT head and CT cervical spine check INR, and reevaluate.  CT scans are reassuring, INR is appropriate.  She is appropriate for discharge ____________________________________________   FINAL CLINICAL IMPRESSION(S) / ED DIAGNOSES  Final diagnoses:  Head injury, initial encounter      NEW MEDICATIONS STARTED DURING THIS VISIT:  New Prescriptions   No medications on file     Note:  This document was prepared using Dragon voice recognition software and may include unintentional dictation errors.    Lavonia Drafts, MD 02/21/16 1256

## 2016-02-21 NOTE — ED Notes (Signed)
Pt alert and oriented X4, active, cooperative, pt in NAD. RR even and unlabored, color WNL.  Pt informed to return if any life threatening symptoms occur.   

## 2016-02-22 NOTE — ED Provider Notes (Signed)
CSN: [id number]     Arrival date & time 02/21/16  1204 History   First MD Initiated Contact with Patient 02/21/16 1535     Chief Complaint  Patient presents with  . Fall  . Headache     (Consider location/radiation/quality/duration/timing/severity/associated sxs/prior Treatment) Patient is a 50 y.o. female presenting with headaches.  Headache Pain location:  Generalized Quality:  Dull Radiates to:  Does not radiate Severity currently:  5/10 Onset quality:  Gradual Timing:  Constant Progression:  Worsening Chronicity:  New Similar to prior headaches: no   Context: not activity   Relieved by:  None tried Worsened by:  Nothing Ineffective treatments:  None tried Associated symptoms: no abdominal pain, no cough, no eye pain, no facial pain, no fever and no neck pain     Past Medical History  Diagnosis Date  . GERD (gastroesophageal reflux disease)   . Breast cancer [facility]) June 2015    Invasive lobular carcinoma, 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 ER/ PR+; Her 2 neu not overexpressed, microscopic positive margin (skin).  . Hypertension   . Depression   . Anxiety   . 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 Laparoscopic hysterectomy  2010    [facility]  . Breast surgery Left 04/08/14    mastectomy  . Reduction mammoplasty Right 04/08/14    Dr Tula Nakayama  . Colonoscopy    . Ep implantable device N/A 06/14/2015    Procedure: Loop Recorder Insertion;  Surgeon: Evans Lance, MD;  Location: [place] [facility];  Service: Cardiovascular;  Laterality: N/A;  . Tee without cardioversion N/A 06/14/2015    Procedure: TRANSESOPHAGEAL ECHOCARDIOGRAM (TEE);  Surgeon: Josue Hector, MD;  Location: [facility] ENDOSCOPY;  Service: Cardiovascular;  Laterality: N/A;  . Abdominal hysterectomy  2000    left both ovaries in  . Tonsillectomy and adenoidectomy      pt was 50 years old   Family History  Problem  Relation Age of Onset  . Breast cancer Mother 75  . Breast cancer Maternal Grandmother 72  . Cancer Maternal Grandfather 81    prostate  . Prostate cancer Maternal Grandfather   . Ovarian cancer Other   . Seizures Paternal Grandmother    Social History  Substance Use Topics  . Smoking status: Former Smoker -- 0.25 packs/day    Types: Cigarettes    Quit date: 08/13/2013  . Smokeless tobacco: Never Used     Comment: E cig  . Alcohol Use: No     Comment: social   OB History    Gravida Para Term Preterm AB TAB SAB Ectopic Multiple Living   2 2              Obstetric Comments   1st Menstrual Cycle:  11  1st Pregnancy:  21     Review of Systems  Constitutional: Negative for fever and chills.  Eyes: Negative for pain.  Respiratory: Negative for cough.   Gastrointestinal: Negative for abdominal pain.  Endocrine: Negative for polydipsia and polyuria.  Musculoskeletal: Negative for neck pain.  Neurological: Positive for headaches.  All other systems reviewed and are negative.     Allergies  Review of patient's allergies indicates no known allergies.  Home Medications   Prior to Admission medications   Medication Sig Start Date End Date Taking? Authorizing Provider  amLODipine (NORVASC) 5 MG tablet Take 1 tablet (5 mg total)  by mouth daily. Patient taking differently: Take 2.5 mg by mouth daily.  11/09/15  Yes Gladstone Lighter, MD  anastrozole (ARIMIDEX) 1 MG tablet Take 1 tablet (1 mg total) by mouth every evening. 06/15/15  Yes Cammie Sickle, MD  atorvastatin (LIPITOR) 40 MG tablet Take 1 tablet (40 mg total) by mouth daily at 6 PM. 11/09/15  Yes Gladstone Lighter, MD  b complex vitamins tablet Take 1 tablet by mouth daily. 11/09/15  Yes Gladstone Lighter, MD  baclofen (LIORESAL) 10 MG tablet Take 10 mg by mouth 3 (three) times daily.   Yes Historical Provider, MD  clonazePAM (KLONOPIN) 0.5 MG tablet Take 1 tablet (0.5 mg total) by mouth 2 (two) times daily as needed  for anxiety. 11/09/15  Yes Gladstone Lighter, MD  cyanocobalamin 1000 MCG tablet Take 1 tablet (1,000 mcg total) by mouth daily. 11/09/15  Yes Gladstone Lighter, MD  escitalopram (LEXAPRO) 20 MG tablet Take 20 mg by mouth daily.   Yes Historical Provider, MD  esomeprazole (NEXIUM) 20 MG capsule Take 20 mg by mouth daily as needed (for heartburn).    Yes Historical Provider, MD  folic acid (FOLVITE) 1 MG tablet Take 1 tablet (1 mg total) by mouth daily. 06/22/15  Yes Kelvin Cellar, MD  gabapentin (NEURONTIN) 100 MG capsule Take 1 capsule (100 mg total) by mouth 2 (two) times daily. 11/09/15  Yes Gladstone Lighter, MD  metoprolol tartrate (LOPRESSOR) 25 MG tablet Take 25 mg by mouth 2 (two) times daily.   Yes Historical Provider, MD  polyethylene glycol (MIRALAX / GLYCOLAX) packet Take 17 g by mouth daily as needed.   Yes Historical Provider, MD  prochlorperazine (COMPAZINE) 5 MG tablet Take 5 mg by mouth every 6 (six) hours as needed for nausea or vomiting.   Yes Historical Provider, MD  traMADol (ULTRAM) 50 MG tablet Take 1 tablet (50 mg total) by mouth every 6 (six) hours as needed for moderate pain. 11/09/15  Yes Gladstone Lighter, MD  vitamin B-6 (PYRIDOXINE) 25 MG tablet Take 1 tablet (25 mg total) by mouth daily. 07/12/15  Yes Rosalin Hawking, MD  warfarin (COUMADIN) 5 MG tablet Take 5 mg by mouth daily.   Yes Historical Provider, MD  citalopram (CELEXA) 20 MG tablet Take 1 tablet (20 mg total) by mouth daily. 11/10/15   Gladstone Lighter, MD  feeding supplement, ENSURE ENLIVE, (ENSURE ENLIVE) LIQD Take 237 mLs by mouth 2 (two) times daily between meals. 11/10/15   Gladstone Lighter, MD  nicotine (NICODERM CQ - DOSED IN MG/24 HOURS) 21 mg/24hr patch Place 1 patch (21 mg total) onto the skin daily. 11/09/15   Gladstone Lighter, MD  warfarin (COUMADIN) 3 MG tablet Take 1 tablet (3 mg total) by mouth daily at 6 PM. 11/09/15   Gladstone Lighter, MD   BP 97/65 mmHg  Pulse 71  Temp(Src) 98.8 F (37.1 C)  (Oral)  Resp 14  Ht 5\' 2"  (1.575 m)  Wt 117 lb (53.071 kg)  BMI 21.39 kg/m2  SpO2 98% Physical Exam  Constitutional: She is oriented to person, place, and time. She appears well-developed and well-nourished.  HENT:  Head: Normocephalic and atraumatic.  Neck: Normal range of motion.  Cardiovascular: Normal rate and regular rhythm.   Pulmonary/Chest: No stridor. No respiratory distress.  Abdominal: Soft. She exhibits no distension. There is no tenderness.  Musculoskeletal: Normal range of motion. She exhibits no edema or tenderness.  Neurological: She is alert and oriented to person, place, and time.  No altered mental status,  able to give full seemingly accurate history.  Face is symmetric, EOM's intact, pupils equal and reactive, vision intact, tongue and uvula midline without deviation Upper extremity 4/5 on left, lower extremities 4/5 bilaterally, intact pain perception in distal extremities, 2+ reflexes in biceps, patella and achilles tendons. Finger to nose normal.   Skin: Skin is warm and dry.  Nursing note and vitals reviewed.   ED Course  Procedures (including critical care time) Labs Review Labs Reviewed  URINALYSIS, ROUTINE W REFLEX MICROSCOPIC (NOT AT [facility])  TROPONIN I    Imaging Review Dg Chest 2 View  02/21/2016  CLINICAL DATA:  Evaluate for pneumonia. EXAM: CHEST  2 VIEW COMPARISON:  June 21, 2015 FINDINGS: The heart size and mediastinal contours are within normal limits. There is patchy consolidation of the medial right lung base. There are minimal posterior pleural effusions. There is no pulmonary edema. The visualized skeletal structures are unremarkable. IMPRESSION: Patchy consolidation of the medial right lung base more likely due to atelectasis but superimposed pneumonia is not excluded. Minimal bilateral pleural effusions. Electronically Signed   By: Abelardo Diesel M.D.   On: 02/21/2016 17:04   Ct Head Wo Contrast  02/21/2016  CLINICAL DATA:  Severe headaches  since a fall 2 days ago. The patient hit her head on the floor and there is a small posterior hematoma on the right. The patient takes Coumadin. EXAM: CT HEAD WITHOUT CONTRAST CT CERVICAL SPINE WITHOUT CONTRAST TECHNIQUE: Multidetector CT imaging of the head and cervical spine was performed following the standard protocol without intravenous contrast. Multiplanar CT image reconstructions of the cervical spine were also generated. COMPARISON:  Brain MR dated 11/07/2015 and cervical spine MR dated 11/07/2015. Head CT dated 09/05/2015. FINDINGS: CT HEAD FINDINGS Normal appearing cerebral hemispheres and posterior fossa structures. Normal size and position of the ventricles. No skull fracture, intracranial hemorrhage or paranasal sinus air-fluid levels. CT CERVICAL SPINE FINDINGS Reversal of the normal cervical lordosis. Moderate anterior and mild posterior spur formation at the C5-6 level. Moderate anterior and minimal posterior spur formation at the C6-7 level. No prevertebral soft tissue swelling, fractures or subluxations. IMPRESSION: 1. No skull fracture or intracranial hemorrhage. 2. No cervical spine fracture or subluxation. 3. Cervical spine degenerative changes and reversal of the normal lordosis. Electronically Signed   By: Claudie Revering M.D.   On: 02/21/2016 10:23   Ct Cervical Spine Wo Contrast  02/21/2016  CLINICAL DATA:  Severe headaches since a fall 2 days ago. The patient hit her head on the floor and there is a small posterior hematoma on the right. The patient takes Coumadin. EXAM: CT HEAD WITHOUT CONTRAST CT CERVICAL SPINE WITHOUT CONTRAST TECHNIQUE: Multidetector CT imaging of the head and cervical spine was performed following the standard protocol without intravenous contrast. Multiplanar CT image reconstructions of the cervical spine were also generated. COMPARISON:  Brain MR dated 11/07/2015 and cervical spine MR dated 11/07/2015. Head CT dated 09/05/2015. FINDINGS: CT HEAD FINDINGS Normal  appearing cerebral hemispheres and posterior fossa structures. Normal size and position of the ventricles. No skull fracture, intracranial hemorrhage or paranasal sinus air-fluid levels. CT CERVICAL SPINE FINDINGS Reversal of the normal cervical lordosis. Moderate anterior and mild posterior spur formation at the C5-6 level. Moderate anterior and minimal posterior spur formation at the C6-7 level. No prevertebral soft tissue swelling, fractures or subluxations. IMPRESSION: 1. No skull fracture or intracranial hemorrhage. 2. No cervical spine fracture or subluxation. 3. Cervical spine degenerative changes and reversal of the normal lordosis. Electronically  Signed   By: Claudie Revering M.D.   On: 02/21/2016 10:23   Mr Brain Wo Contrast  02/21/2016  CLINICAL DATA:  Weakness. Stroke. On Coumadin. Recent fall 2 days ago with head injury. EXAM: MRI HEAD WITHOUT CONTRAST TECHNIQUE: Multiplanar, multiecho pulse sequences of the brain and surrounding structures were obtained without intravenous contrast. COMPARISON:  MRI 11/07/2015.  CT head 02/21/2016 FINDINGS: Restricted diffusion in the right occipital cortex, nearly identical in distribution to the prior MRI of 11/07/2015. Small amount of associated blood products are unchanged. The area shows hyperintensity on FLAIR which appears unchanged from the prior study. The diffusion hyperintensity could be due to the blood products in the area. I cannot completely exclude a new infarct in the same distribution as seen on the prior study. No other areas of restricted diffusion Mild atrophy.  Negative for hydrocephalus Chronic infarct in the high right frontal and parietal cortex. Smaller chronic infarct in the high left parietal cortex. Basal ganglia and brainstem normal Negative for mass or edema.  No shift of the midline structures. Paranasal sinuses clear. Normal orbit. Pituitary normal in size. Skull base and calvarium intact. IMPRESSION: Small focus of diffusion  hyperintensity in the right temporal occipital cortex is unchanged from the prior MRI. Small amount of associated hemorrhage unchanged. Diffusion signal may be related to the blood products. A recurrent infarct in the same cortex would appear unlikely but not completely excluded. Chronic infarcts bilaterally. Electronically Signed   By: Franchot Gallo M.D.   On: 02/21/2016 18:49   I have personally reviewed and evaluated these images and lab results as part of my medical decision-making.   EKG Interpretation None      MDM   Final diagnoses:  Post concussive syndrome  Fall, subsequent encounter  Nonintractable headache, unspecified chronicity pattern, unspecified headache type    51 year old female here with a headache after fall likely postconcussive. Discussed with Dr. Leonie Man who is her neurologist. MRI negative for any acute stroke. Will follow with her neurologist next week or 2.  New Prescriptions: Discharge Medication List as of 02/21/2016  7:26 PM      I have personally and contemperaneously reviewed labs and imaging and used in my decision making as above.   A medical screening exam was performed and I feel the patient has had an appropriate workup for their chief complaint at this time and likelihood of emergent condition existing is low and thus workup can continue on an outpatient basis.. Their vital signs are stable. They have been counseled on decision, discharge, follow up and which symptoms necessitate immediate return to the emergency department.  They verbally stated understanding and agreement with plan and discharged in stable condition.    Merrily Pew, MD 02/22/16 [phone]

## 2016-02-29 ENCOUNTER — Telehealth: Payer: Self-pay | Admitting: *Deleted

## 2016-02-29 LAB — CUP PACEART REMOTE DEVICE CHECK: MDC IDC SESS DTM: 20170629164049

## 2016-02-29 NOTE — Telephone Encounter (Signed)
Medical records faxed to [facility] on 02/29/2016

## 2016-03-12 ENCOUNTER — Ambulatory Visit (INDEPENDENT_AMBULATORY_CARE_PROVIDER_SITE_OTHER): Payer: Managed Care, Other (non HMO) | Admitting: *Deleted

## 2016-03-12 DIAGNOSIS — I639 Cerebral infarction, unspecified: Secondary | ICD-10-CM | POA: Diagnosis not present

## 2016-03-12 NOTE — Progress Notes (Signed)
Carelink Summary Report / Loop Recorder 

## 2016-03-19 LAB — CUP PACEART REMOTE DEVICE CHECK: Date Time Interrogation Session: 20170729171345

## 2016-03-28 ENCOUNTER — Ambulatory Visit: Payer: Managed Care, Other (non HMO) | Admitting: Neurology

## 2016-03-29 DIAGNOSIS — Z0289 Encounter for other administrative examinations: Secondary | ICD-10-CM

## 2016-04-05 ENCOUNTER — Encounter: Payer: Self-pay | Admitting: *Deleted

## 2016-04-09 ENCOUNTER — Ambulatory Visit (INDEPENDENT_AMBULATORY_CARE_PROVIDER_SITE_OTHER): Payer: Managed Care, Other (non HMO) | Admitting: Neurology

## 2016-04-09 ENCOUNTER — Ambulatory Visit (INDEPENDENT_AMBULATORY_CARE_PROVIDER_SITE_OTHER): Payer: Managed Care, Other (non HMO) | Admitting: *Deleted

## 2016-04-09 ENCOUNTER — Encounter: Payer: Self-pay | Admitting: Neurology

## 2016-04-09 VITALS — BP 92/65 | HR 58 | Ht 62.0 in | Wt 123.8 lb

## 2016-04-09 DIAGNOSIS — I639 Cerebral infarction, unspecified: Secondary | ICD-10-CM | POA: Diagnosis not present

## 2016-04-09 DIAGNOSIS — R269 Unspecified abnormalities of gait and mobility: Secondary | ICD-10-CM

## 2016-04-09 NOTE — Patient Instructions (Addendum)
I had a long discussion with the patient and her mother and caregiver regarding her recent strokes and gait ataxia and likely underlying peripheral neuropathy and answered questions. Continue warfarin for stroke prevention given her hypercoagulability related to cancer and multiple strokes. Continue strict control of hypertension with blood pressure goal below 130/90 and lipids with LDL cholesterol goal below 70 mg percent. Patient also counseled to quit smoking. Patient is refusing EMG nerve conduction study for neuropathy . Continue vitamin B12 replacement. Continue ongoing physical occupational therapy for gait and balance training I advised the patient to taper metoprolol tartrate to 12.5 mg twice daily for 1 week followed by 12.5 mg once daily for another week and discontinue as she is having significant hypotension and bradycardia and had a recent fall likely due to postural hypotension.I spent more than 25 minutes of face to face time with the patient. Greater than 50% of time was spent in counseling and coordination of care. Follow-up in the future with nurse practitioner in our clinic in 3 months or call earlier if necessary.

## 2016-04-10 NOTE — Progress Notes (Signed)
Carelink Summary Report / Loop Recorder 

## 2016-04-10 NOTE — Progress Notes (Signed)
STROKE NEUROLOGY FOLLOW UP NOTE  NAME: Tina Sharp DOB: 24-May-1966  REASON FOR VISIT: stroke follow up HISTORY FROM: pt and chart  Today we had the pleasure of seeing Tina Sharp in follow-up at our Neurology Clinic. Pt was accompanied by mom.   History Summary Tina Sharp is a 50 y.o. female with history of GERD and invasive lobular breast carcinoma was admitted on 06/11/15 for acute onset of numbness  and left hemiparesis and headache with photophobia. She received IV TPA at [facility] and was transferred to [facility]. Her symptoms improved. 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 MRA showed irregular right M3 and M4 branches with right fetal PCA. CUS showed right ICA 40-59% stenosis. TTE and TEE unremarkable and loop recorder placed. LDL 65 and A1C 5.4 with negative hypercoagulable work up except homocysteine level 36.2. She was discharged with ASA 325mg .  She was readmitted on 06/21/15 for worsening left sided symptoms. MRI negative for new strokes but CTA head and neck showed right ICA thrombus. She was put on anticoagulation coumadin with lovenox bridge.  Discharged in good condition with outpt PT/OT. 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   Follow up 07/12/15 - the Tina Sharp has been doing well. No recurrent stroke like symptoms. Continue following with outpt PT/OT and left hand getting better but still difficulty with fine motor activity. Still on coumadin but recently decreased dose due to INR.> 5.0. Loop recorder no afib found so far. Still not quit smoking yet. BP 152/93 today but pt stated that BP at home 120-140s. Currently has PT/OT twice a week.   Last visit  09/27/15 :   She is seen today for follow-up and she called complaining of tingling numbness in her fingertips as well as tiredness. She had a follow-up CT angiogram done on 09/15/14 which I have personally reviewed and shows  significant resolution of the right carotid clot with only small remnants remaining with excellent flow. Tina Sharp states her blood pressure usually well controlled but she has had some hot flashes today and hence it is elevated in office at 163/112. She is doing outpatient physical occupational therapy and feels her walking has improved. She is being treated for depression by primary care physician and feels her medications working and she is doing much better. She is scheduled to undergo electrical study possible no conduction Week. She has been denied disability and I advised her that from the stroke standpoint she clearly does not have enough physical deficits to justify. Update 01/03/2016 : Tina Sharp is seen for follow-up after last visit to 3 months ago. The Tina Sharp was hospitalized at [facility] on 3/59/17. She is accompanied by her parents today. Tina Sharp developed increasing weakness and ataxia. MRI scan of the brain showed a small right occipital parietal infarcts as well as several late subacute infarcts in the right frontal and parietal areas as well. which were felt to be of cryptogenic etiology perhaps related to hypercoagulability from her breast cancer. MRA of the brain showed no significant large vessel stenosis or occlusion. MRI scan cervical spine showed broad-based posterior disc osteophyte complex at C5-6 resulting in mild foraminal narrowing and mild spinal stenosis at C6-7. MRI lumbar spine also showed only mild disc degenerative changes. She was continued on warfarin. Tina Sharp has had significant trouble with walking and balance since then. She is currently in [facility] skilled nursing facility in [place].  She is barely able to walk even with the 2 physical therapists. She reports generalized weakness with incoordination as well. She can also complains of tingling and numbness in both hands as well as feet. She was found to have low vitamin B12 for which she is  getting replacement therapy but her paresthesias and balance continue. Update 04/09/2016 :  She returns for follow-up after last visit 3 months ago. She is accompanied by her caregiver and mother. Tina Sharp had a fall on 02/22/16 and developed a concussion. She was seen in the ER and CT scan of the head showed no acute abnormalities. Fall occurred when Tina Sharp was trying to get up from chair and slipped. Tina Sharp was advised to undergo EMG nerve conduction study by me at last visit but she refused due to possible pain. She's done well since then and had no major falls. She states her blood pressure tends to run low and today it is 92/6980/582 separate occasions. The Tina Sharp has filed for short-term disability, stroke in October last year. She states that she cannot walk well yet and she is unable to drive and cook and manage her own affairs. She is currently walking with a walker with a gait belt and 1 person assist. REVIEW OF SYSTEMS: Full 14 system review of systems performed and notable only for those listed  in HPI above, all others are negative:    No Known Allergies  The neurologically relevant items on the Tina Sharp's problem list were reviewed on today's visit.  Neurologic Examination  A problem focused neurological exam (12 or more points of the single system neurologic examination, vital signs counts as 1 point, cranial nerves count for 8 points) was performed.  Blood pressure 92/65, pulse (!) 58, height 5\' 2"  (1.575 m), weight 123 lb 12.8 oz (56.2 kg).  General - Well nourished, well developed, in no apparent distress.  Ophthalmologic - funduscopic exam not done.   Cardiovascular - Regular rate and rhythm with no murmur.  Mental Status -  Level of arousal and orientation to time, place, and person were intact. Language including expression, naming, repetition, comprehension was assessed and found intact. Fund of Knowledge was assessed and was intact.  Cranial Nerves II - XII - II -  Visual field intact OU. III, IV, VI - Extraocular movements intact. V - Facial sensation intact bilaterally. VII - Facial movement intact bilaterally. VIII - Hearing & vestibular intact bilaterally. X - Palate elevates symmetrically. XI - Chin turning & shoulder shrug intact bilaterally. XII - Tongue protrusion intact.  Motor Strength - The Tina Sharp's strength was normal in upper extremities except mild left hand decreased grip and dexterity difficulty and pronator drift was absent. 4/5 bilateral LE proximally, but 5/5 distally.    Bulk was normal and fasciculations were absent.   Motor Tone - Muscle tone was assessed at the neck and appendages and was normal.  Reflexes - The Tina Sharp's reflexes were 2+ in all extremities include patellar and ankle reflex and she had no pathological reflexes.  Sensory - Light touch, temperature/pinprick were assessed and were normal except bilateral anterior thigh decreased touch and pinprick.    Coordination - The Tina Sharp had   Mild left finger to nose and bilateral knee to heel dysmetria.  Tremor was absent.  Gait and Station - deferred as Tina Sharp unable even with help  Data reviewed: MRI Brain 11/07/15 : 1. Small area of acute/early subacute infarct in the right parieto-occipital region. 2. Multiple late subacute infarcts in the right cerebral  hemisphere as previously demonstrated MRA Brain wo 11/08/15 : 1. No significant proximal stenosis, aneurysm, or branch vessel occlusion. 2. 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 3. Mild-to-moderate distal branch vessel narrowing in the ACA and PCA distributions bilaterally. MRI Cervical Spine wo 11/07/2015 : 1. Mildly to moderately motion degraded examination. 2. Mild spinal stenosis and mild bilateral neural foraminal stenosis at C6-7. 3. Mild bilateral foraminal stenosis at C5-6. Carotid Dopplers 11/08/15 : 1. Mild (1-49%) stenosis proximal right internal  carotid artery secondary to trace smooth hypoechoic atherosclerotic plaque. 2. Mild (1-49%) stenosis proximal left internal carotid artery secondary to heterogenous atherosclerotic plaque. 3. Vertebral arteries are patent with normal antegrade flow         06/22/2015                                   41.8     Comment     Comment     Comment                                                  5.4     108                         179 (L)                                                  3.9 (L)     Assessment: As you may recall, she is a 50 y.o. Caucasian female with PMH of GERD and invasive lobular breast carcinoma was admitted on 06/11/15 for scattered right MCA and PCA infarcts. She received tPA.  She was readmitted on 06/21/15 for worsening left sided symptoms. MRI negative for new strokes but CTA head and neck showed right ICA thrombus. 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 Still not quit smoking yet.new to my practice admission in March 2017 for new right frontal and pareital embolic infarcts of cryptogenic etiology despite anticoagulation. 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 Tina Sharp's inability to walk seems out of proportion to size and location of her recent strokes and alternative contributing conditions like underlying neuropathy need to be explored. Plan:  II had a long discussion with the Tina Sharp and her mother and caregiver regarding her recent strokes and gait ataxia and likely underlying peripheral neuropathy and answered questions. Continue warfarin for stroke prevention given her hypercoagulability related to cancer and multiple strokes. Continue strict control of hypertension with blood pressure goal below 130/90 and lipids with LDL cholesterol goal below 70 mg percent. Tina Sharp  also counseled to quit smoking. Tina Sharp is refusing EMG nerve conduction study for neuropathy . Continue vitamin B12 replacement. Continue ongoing physical occupational therapy for gait and balance training I advised the Tina Sharp to taper metoprolol tartrate to 12.5 mg twice daily for 1 week followed by 12.5 mg once daily for another week and discontinue as she is having significant  hypotension and bradycardia and had a recent fall likely due to postural hypotension.I spent more than 25 minutes of face to face time with the Tina Sharp. Greater than 50% of time was spent in counseling and coordination of care. Follow-up in the future with nurse practitioner in our clinic in 3 months or call earlier if necessary.        Antony Contras, MD       [facility] [address] [place], [place] [address] [phone]

## 2016-05-01 ENCOUNTER — Inpatient Hospital Stay
Admission: RE | Admit: 2016-05-01 | Discharge: 2016-05-01 | Disposition: A | Discharge status: Home / Self Care | Payer: Self-pay | Source: Ambulatory Visit | Attending: *Deleted | Admitting: *Deleted

## 2016-05-01 ENCOUNTER — Other Ambulatory Visit: Payer: Self-pay | Admitting: *Deleted

## 2016-05-01 DIAGNOSIS — Z9289 Personal history of other medical treatment: Secondary | ICD-10-CM

## 2016-05-02 ENCOUNTER — Other Ambulatory Visit: Payer: Self-pay | Admitting: General Surgery

## 2016-05-02 DIAGNOSIS — C50012 Malignant neoplasm of nipple and areola, left female breast: Secondary | ICD-10-CM

## 2016-05-03 ENCOUNTER — Other Ambulatory Visit: Payer: Self-pay | Admitting: General Surgery

## 2016-05-03 ENCOUNTER — Encounter: Payer: Self-pay | Admitting: *Deleted

## 2016-05-03 DIAGNOSIS — C50012 Malignant neoplasm of nipple and areola, left female breast: Secondary | ICD-10-CM

## 2016-05-05 LAB — CUP PACEART REMOTE DEVICE CHECK: MDC IDC SESS DTM: 20170828173835

## 2016-05-05 NOTE — Progress Notes (Signed)
[facility] summary report received. Battery status OK. Normal device function. No new symptom episodes, tachy episodes, brady, or pause episodes. No new AF episodes. Monthly summary reports and ROV/PRN 

## 2016-05-09 ENCOUNTER — Ambulatory Visit (INDEPENDENT_AMBULATORY_CARE_PROVIDER_SITE_OTHER): Payer: Managed Care, Other (non HMO) | Admitting: *Deleted

## 2016-05-09 DIAGNOSIS — I639 Cerebral infarction, unspecified: Secondary | ICD-10-CM | POA: Diagnosis not present

## 2016-05-09 NOTE — Progress Notes (Signed)
Carelink Summary Report / Loop Recorder 

## 2016-05-21 ENCOUNTER — Other Ambulatory Visit: Payer: Managed Care, Other (non HMO)

## 2016-05-21 ENCOUNTER — Ambulatory Visit: Payer: Managed Care, Other (non HMO)

## 2016-05-21 ENCOUNTER — Encounter: Payer: Self-pay | Admitting: *Deleted

## 2016-05-28 ENCOUNTER — Ambulatory Visit: Payer: Managed Care, Other (non HMO) | Admitting: General Surgery

## 2016-06-06 ENCOUNTER — Ambulatory Visit
Admission: RE | Admit: 2016-06-06 | Discharge: 2016-06-06 | Disposition: A | Discharge status: Home / Self Care | Payer: Managed Care, Other (non HMO) | Source: Ambulatory Visit | Attending: General Surgery | Admitting: General Surgery

## 2016-06-06 ENCOUNTER — Other Ambulatory Visit: Payer: Self-pay | Admitting: General Surgery

## 2016-06-06 DIAGNOSIS — C50012 Malignant neoplasm of nipple and areola, left female breast: Secondary | ICD-10-CM

## 2016-06-06 DIAGNOSIS — R921 Mammographic calcification found on diagnostic imaging of breast: Secondary | ICD-10-CM | POA: Insufficient documentation

## 2016-06-07 ENCOUNTER — Encounter: Payer: Self-pay | Admitting: *Deleted

## 2016-06-08 ENCOUNTER — Ambulatory Visit (INDEPENDENT_AMBULATORY_CARE_PROVIDER_SITE_OTHER): Payer: Managed Care, Other (non HMO) | Admitting: *Deleted

## 2016-06-08 DIAGNOSIS — I639 Cerebral infarction, unspecified: Secondary | ICD-10-CM

## 2016-06-11 NOTE — Progress Notes (Signed)
Carelink Summary Report / Loop Recorder 

## 2016-06-13 ENCOUNTER — Ambulatory Visit (INDEPENDENT_AMBULATORY_CARE_PROVIDER_SITE_OTHER): Payer: Managed Care, Other (non HMO) | Admitting: General Surgery

## 2016-06-13 VITALS — BP 124/70 | Ht 62.0 in | Wt 132.0 lb

## 2016-06-13 DIAGNOSIS — C50012 Malignant neoplasm of nipple and areola, left female breast: Secondary | ICD-10-CM

## 2016-06-13 NOTE — Progress Notes (Signed)
Patient ID: Tina Sharp, female   DOB: 1966/01/12, 51 y.o.   MRN: [id number]  Chief Complaint  Patient presents with  . Follow-up    mammogram    HPI Tina Sharp is a 50 y.o. female who presents for a breast evaluation. The most recent mammogram was done on 06/06/16.  Patient does perform regular self breast checks and gets regular mammograms done.    HPI  Past Medical History:  Diagnosis Date  . Anxiety   . Breast cancer [facility]) June 2015   Invasive lobular carcinoma, 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 ER/ PR+; Her 2 neu not overexpressed, microscopic positive margin (skin).  . Depression   . GERD (gastroesophageal reflux disease)   . Hypertension   . Stroke ([place]) 06/11/2015   cerebrum, cryptogenic right brain infarcts s/p IV TPA    Past Surgical History:  Procedure Laterality Date  . ABDOMINAL HYSTERECTOMY  2000   left both ovaries in  . BREAST SURGERY Left 04/08/14   mastectomy  . COLONOSCOPY    . EP IMPLANTABLE DEVICE N/A 06/14/2015   Procedure: Loop Recorder Insertion;  Surgeon: Evans Lance, MD;  Location: [facility];  Service: Cardiovascular;  Laterality: N/A;  . LAPAROSCOPIC HYSTERECTOMY  2010   [facility]  . MASTECTOMY Left 2016  . REDUCTION MAMMAPLASTY Right 2016   Lift  . reduction mammoplasty Right 04/08/14   Dr Tula Nakayama  . TEE WITHOUT CARDIOVERSION N/A 06/14/2015   Procedure: TRANSESOPHAGEAL ECHOCARDIOGRAM (TEE);  Surgeon: Josue Hector, MD;  Location: [facility] ENDOSCOPY;  Service: Cardiovascular;  Laterality: N/A;  . TONSILLECTOMY AND ADENOIDECTOMY     pt was 50 years old    Family History  Problem Relation Age of Onset  . Breast cancer Mother 60  . Ovarian cancer Other   . Stroke Paternal Grandmother   . Breast cancer Maternal Grandmother 44  . Cancer Maternal Grandfather 20    prostate  . Prostate cancer Maternal Grandfather     Social History Social History  Substance Use Topics  . Smoking status: Former Smoker    Packs/day: 0.25   Types: Cigarettes    Quit date: 10/12/2015  . Smokeless tobacco: Never Used     Comment: E cig  . Alcohol use No     Comment: social    No Known Allergies  Current Outpatient Prescriptions  Medication Sig Dispense Refill  . anastrozole (ARIMIDEX) 1 MG tablet Take 1 tablet (1 mg total) by mouth every evening. 7 tablet 0  . atorvastatin (LIPITOR) 40 MG tablet Take 1 tablet (40 mg total) by mouth daily at 6 PM. 30 tablet 2  . b complex vitamins tablet Take 1 tablet by mouth daily. 30 tablet 0  . baclofen (LIORESAL) 10 MG tablet Take 10 mg by mouth 3 (three) times daily.    . clonazePAM (KLONOPIN) 0.5 MG tablet Take 1 tablet (0.5 mg total) by mouth 2 (two) times daily as needed for anxiety. 20 tablet 0  . cyanocobalamin 1000 MCG tablet Take 1 tablet (1,000 mcg total) by mouth daily. 30 tablet 2  . DULoxetine (CYMBALTA) 20 MG capsule Take 20 mg by mouth daily.    . folic acid (FOLVITE) 1 MG tablet Take 1 tablet (1 mg total) by mouth daily. 30 tablet 0  . gabapentin (NEURONTIN) 100 MG capsule Take 1 capsule (100 mg total) by mouth 2 (two) times daily. 60 capsule 2  . omeprazole (PRILOSEC) 20 MG capsule Take 20 mg by mouth as needed.    Marland Kitchen  prochlorperazine (COMPAZINE) 5 MG tablet Take 5 mg by mouth every 6 (six) hours as needed for nausea or vomiting.    . solifenacin (VESICARE) 5 MG tablet Take 5 mg by mouth daily. Take after supper    . traMADol (ULTRAM) 50 MG tablet Take 1 tablet (50 mg total) by mouth every 6 (six) hours as needed for moderate pain. 20 tablet 0  . warfarin (COUMADIN) 3 MG tablet Take 1 tablet (3 mg total) by mouth daily at 6 PM. 30 tablet 2   No current facility-administered medications for this visit.     Review of Systems Review of Systems  Constitutional: Negative.   Respiratory: Negative.   Cardiovascular: Negative.     Blood pressure 124/70, height 5\' 2"  (1.575 m), weight 132 lb (59.9 kg).  Physical Exam Physical Exam  Constitutional: She is oriented to  person, place, and time. She appears well-developed and well-nourished.  Eyes: Conjunctivae are normal. No scleral icterus.  Neck: Neck supple.  Cardiovascular: Normal rate, regular rhythm and normal heart sounds.   Pulmonary/Chest: Effort normal and breath sounds normal. Right breast exhibits no inverted nipple, no mass, no nipple discharge, no skin change and no tenderness. Left breast exhibits no inverted nipple, no mass, no nipple discharge, no skin change and no tenderness.    Abdominal: Soft. Bowel sounds are normal. There is no tenderness.  Lymphadenopathy:    She has no cervical adenopathy.    She has no axillary adenopathy.  Neurological: She is alert and oriented to person, place, and time.  Skin: Skin is warm and dry.    Data Reviewed Right breast mammogram dated 06/06/2016 was reviewed. Dystrophic calcifications consistent with previous reduction mammoplasty. I read-3.  Assessment    Doing well now 2 years status post left mastectomy for a T2 carcinoma. No evidence of skin recurrence.  Excellent progression status post CVA.    Plan    6 month follow-up exam with right breast diagnostic mammogram to assure stability of microcalcifications.    Patient will be asked to return to the office in six months with a bilateral diagnotic mammogram. This information has been scribed by Gaspar Cola CMA.   Tina Sharp 06/13/2016, 9:17 PM

## 2016-06-13 NOTE — Patient Instructions (Addendum)
Patient will be asked to return to the office in six months with a bilateral diagnotic mammogram 

## 2016-06-15 DIAGNOSIS — M7502 Adhesive capsulitis of left shoulder: Secondary | ICD-10-CM | POA: Insufficient documentation

## 2016-06-15 DIAGNOSIS — M7582 Other shoulder lesions, left shoulder: Secondary | ICD-10-CM | POA: Insufficient documentation

## 2016-07-09 ENCOUNTER — Ambulatory Visit (INDEPENDENT_AMBULATORY_CARE_PROVIDER_SITE_OTHER): Payer: Managed Care, Other (non HMO) | Admitting: *Deleted

## 2016-07-09 DIAGNOSIS — I639 Cerebral infarction, unspecified: Secondary | ICD-10-CM | POA: Diagnosis not present

## 2016-07-09 NOTE — Progress Notes (Signed)
Carelink Summary Report / Loop Recorder 

## 2016-07-10 ENCOUNTER — Ambulatory Visit: Payer: Managed Care, Other (non HMO) | Admitting: Adult Health

## 2016-07-15 LAB — CUP PACEART REMOTE DEVICE CHECK
Implantable Pulse Generator Implant Date: 20161101
MDC IDC SESS DTM: 20171027183912

## 2016-07-15 NOTE — Progress Notes (Signed)
[facility] summary report received. Battery status OK. Normal device function. No new symptom episodes, tachy episodes, brady, or pause episodes. No new AF episodes. Monthly summary reports and ROV/PRN 

## 2016-07-30 ENCOUNTER — Encounter: Payer: Self-pay | Admitting: Emergency Medicine

## 2016-07-30 ENCOUNTER — Emergency Department: Payer: Managed Care, Other (non HMO)

## 2016-07-30 ENCOUNTER — Inpatient Hospital Stay
Admission: EM | Admit: 2016-07-30 | Discharge: 2016-08-03 | DRG: 470 | Disposition: A | Discharge status: Skilled Nursing Facility | Payer: Managed Care, Other (non HMO) | Attending: Specialist | Admitting: Specialist

## 2016-07-30 DIAGNOSIS — F329 Major depressive disorder, single episode, unspecified: Secondary | ICD-10-CM | POA: Diagnosis present

## 2016-07-30 DIAGNOSIS — Z87891 Personal history of nicotine dependence: Secondary | ICD-10-CM | POA: Diagnosis not present

## 2016-07-30 DIAGNOSIS — S72001A Fracture of unspecified part of neck of right femur, initial encounter for closed fracture: Principal | ICD-10-CM | POA: Diagnosis present

## 2016-07-30 DIAGNOSIS — W19XXXA Unspecified fall, initial encounter: Secondary | ICD-10-CM

## 2016-07-30 DIAGNOSIS — F419 Anxiety disorder, unspecified: Secondary | ICD-10-CM | POA: Diagnosis present

## 2016-07-30 DIAGNOSIS — Z6826 Body mass index (BMI) 26.0-26.9, adult: Secondary | ICD-10-CM | POA: Diagnosis not present

## 2016-07-30 DIAGNOSIS — Z853 Personal history of malignant neoplasm of breast: Secondary | ICD-10-CM

## 2016-07-30 DIAGNOSIS — I10 Essential (primary) hypertension: Secondary | ICD-10-CM | POA: Diagnosis present

## 2016-07-30 DIAGNOSIS — R262 Difficulty in walking, not elsewhere classified: Secondary | ICD-10-CM

## 2016-07-30 DIAGNOSIS — K219 Gastro-esophageal reflux disease without esophagitis: Secondary | ICD-10-CM | POA: Diagnosis present

## 2016-07-30 DIAGNOSIS — Z96649 Presence of unspecified artificial hip joint: Secondary | ICD-10-CM

## 2016-07-30 DIAGNOSIS — S72009A Fracture of unspecified part of neck of unspecified femur, initial encounter for closed fracture: Secondary | ICD-10-CM | POA: Diagnosis present

## 2016-07-30 DIAGNOSIS — R202 Paresthesia of skin: Secondary | ICD-10-CM | POA: Diagnosis present

## 2016-07-30 DIAGNOSIS — M25551 Pain in right hip: Secondary | ICD-10-CM

## 2016-07-30 DIAGNOSIS — Z79899 Other long term (current) drug therapy: Secondary | ICD-10-CM

## 2016-07-30 DIAGNOSIS — M6281 Muscle weakness (generalized): Secondary | ICD-10-CM

## 2016-07-30 DIAGNOSIS — W010XXA Fall on same level from slipping, tripping and stumbling without subsequent striking against object, initial encounter: Secondary | ICD-10-CM | POA: Diagnosis present

## 2016-07-30 DIAGNOSIS — Z7901 Long term (current) use of anticoagulants: Secondary | ICD-10-CM

## 2016-07-30 DIAGNOSIS — I69398 Other sequelae of cerebral infarction: Secondary | ICD-10-CM

## 2016-07-30 LAB — CBC WITH DIFFERENTIAL/PLATELET
Basophils Absolute: 0.2 10*3/uL — ABNORMAL HIGH (ref 0–0.1)
Basophils Relative: 2 %
Eosinophils Absolute: 0.2 10*3/uL (ref 0–0.7)
Eosinophils Relative: 2 %
HCT: 39.6 % (ref 35.0–47.0)
Hemoglobin: 13.5 g/dL (ref 12.0–16.0)
Lymphocytes Relative: 29 %
Lymphs Abs: 2.1 10*3/uL (ref 1.0–3.6)
MCH: 29.1 pg (ref 26.0–34.0)
MCHC: 34.2 g/dL (ref 32.0–36.0)
MCV: 85.1 fL (ref 80.0–100.0)
Monocytes Absolute: 0.5 10*3/uL (ref 0.2–0.9)
Monocytes Relative: 6 %
Neutro Abs: 4.4 10*3/uL (ref 1.4–6.5)
Neutrophils Relative %: 61 %
Platelets: 271 10*3/uL (ref 150–440)
RBC: 4.65 MIL/uL (ref 3.80–5.20)
RDW: 15.1 % — ABNORMAL HIGH (ref 11.5–14.5)
WBC: 7.3 10*3/uL (ref 3.6–11.0)

## 2016-07-30 LAB — PROTIME-INR
INR: 2.4
Prothrombin Time: 26.6 seconds — ABNORMAL HIGH (ref 11.4–15.2)

## 2016-07-30 LAB — SURGICAL PCR SCREEN
MRSA, PCR: NEGATIVE
STAPHYLOCOCCUS AUREUS: NEGATIVE

## 2016-07-30 LAB — BASIC METABOLIC PANEL
Anion gap: 6 (ref 5–15)
BUN: 9 mg/dL (ref 6–20)
CALCIUM: 9 mg/dL (ref 8.9–10.3)
CO2: 28 mmol/L (ref 22–32)
CREATININE: 0.67 mg/dL (ref 0.44–1.00)
Chloride: 107 mmol/L (ref 101–111)
Glucose, Bld: 101 mg/dL — ABNORMAL HIGH (ref 65–99)
Potassium: 3.7 mmol/L (ref 3.5–5.1)
SODIUM: 141 mmol/L (ref 135–145)

## 2016-07-30 LAB — TSH: TSH: 1.31 u[IU]/mL (ref 0.350–4.500)

## 2016-07-30 MED ORDER — FOLIC ACID 1 MG PO TABS
1.0000 mg | ORAL_TABLET | Freq: Every day | ORAL | Status: DC
  Administered 2016-07-30 – 2016-08-03 (×4): 1 mg via ORAL
  Filled 2016-07-30 (×4): qty 1

## 2016-07-30 MED ORDER — GABAPENTIN 600 MG PO TABS
600.0000 mg | ORAL_TABLET | Freq: Four times a day (QID) | ORAL | Status: DC
  Administered 2016-07-30 – 2016-08-03 (×15): 600 mg via ORAL
  Filled 2016-07-30 (×16): qty 1

## 2016-07-30 MED ORDER — MORPHINE SULFATE (PF) 2 MG/ML IV SOLN
2.0000 mg | INTRAVENOUS | Status: DC | PRN
  Administered 2016-07-30 – 2016-08-01 (×9): 2 mg via INTRAVENOUS
  Filled 2016-07-30 (×9): qty 1

## 2016-07-30 MED ORDER — CLONAZEPAM 0.125 MG PO TBDP
0.5000 mg | ORAL_TABLET | Freq: Two times a day (BID) | ORAL | Status: DC
  Administered 2016-07-30 – 2016-08-03 (×8): 0.5 mg via ORAL
  Filled 2016-07-30 (×8): qty 4

## 2016-07-30 MED ORDER — ONDANSETRON HCL 4 MG/2ML IJ SOLN
4.0000 mg | Freq: Once | INTRAMUSCULAR | Status: AC
  Administered 2016-07-30: 4 mg via INTRAVENOUS
  Filled 2016-07-30: qty 2

## 2016-07-30 MED ORDER — ANASTROZOLE 1 MG PO TABS
1.0000 mg | ORAL_TABLET | Freq: Every evening | ORAL | Status: DC
  Administered 2016-07-30 – 2016-08-03 (×5): 1 mg via ORAL
  Filled 2016-07-30 (×5): qty 1

## 2016-07-30 MED ORDER — VITAMIN B-12 1000 MCG PO TABS
1000.0000 ug | ORAL_TABLET | Freq: Every day | ORAL | Status: DC
  Administered 2016-07-30 – 2016-08-03 (×4): 1000 ug via ORAL
  Filled 2016-07-30 (×4): qty 1

## 2016-07-30 MED ORDER — PANTOPRAZOLE SODIUM 40 MG PO TBEC
40.0000 mg | DELAYED_RELEASE_TABLET | Freq: Every day | ORAL | Status: DC
  Administered 2016-07-30 – 2016-07-31 (×2): 40 mg via ORAL
  Filled 2016-07-30 (×2): qty 1

## 2016-07-30 MED ORDER — CEFAZOLIN SODIUM-DEXTROSE 2-4 GM/100ML-% IV SOLN
2.0000 g | INTRAVENOUS | Status: AC
  Filled 2016-07-30: qty 100

## 2016-07-30 MED ORDER — BOOST / RESOURCE BREEZE PO LIQD
1.0000 | Freq: Three times a day (TID) | ORAL | Status: DC
  Administered 2016-07-30: 22:00:00 via ORAL
  Administered 2016-07-30 – 2016-08-03 (×8): 1 via ORAL

## 2016-07-30 MED ORDER — PROCHLORPERAZINE MALEATE 10 MG PO TABS
5.0000 mg | ORAL_TABLET | Freq: Four times a day (QID) | ORAL | Status: DC | PRN
  Filled 2016-07-30: qty 1

## 2016-07-30 MED ORDER — MORPHINE SULFATE (PF) 4 MG/ML IV SOLN
4.0000 mg | Freq: Once | INTRAVENOUS | Status: AC
  Administered 2016-07-30: 4 mg via INTRAVENOUS
  Filled 2016-07-30: qty 1

## 2016-07-30 MED ORDER — MORPHINE SULFATE (PF) 4 MG/ML IV SOLN
2.0000 mg | Freq: Once | INTRAVENOUS | Status: AC
  Administered 2016-07-30: 2 mg via INTRAVENOUS
  Filled 2016-07-30: qty 1

## 2016-07-30 MED ORDER — ATORVASTATIN CALCIUM 20 MG PO TABS
40.0000 mg | ORAL_TABLET | Freq: Every day | ORAL | Status: DC
  Administered 2016-07-30 – 2016-08-03 (×4): 40 mg via ORAL
  Filled 2016-07-30 (×4): qty 2

## 2016-07-30 MED ORDER — HYDROCODONE-ACETAMINOPHEN 10-325 MG PO TABS
1.0000 | ORAL_TABLET | Freq: Four times a day (QID) | ORAL | Status: DC | PRN
  Administered 2016-07-30 – 2016-08-01 (×7): 1 via ORAL
  Filled 2016-07-30 (×7): qty 1

## 2016-07-30 MED ORDER — RENA-VITE PO TABS
1.0000 | ORAL_TABLET | Freq: Every day | ORAL | Status: DC
  Administered 2016-07-30 – 2016-08-03 (×4): 1 via ORAL
  Filled 2016-07-30 (×4): qty 1

## 2016-07-30 MED ORDER — SODIUM CHLORIDE 0.9 % IV SOLN
INTRAVENOUS | Status: DC
  Administered 2016-07-30: 08:00:00 via INTRAVENOUS

## 2016-07-30 MED ORDER — ACETAMINOPHEN 325 MG PO TABS
650.0000 mg | ORAL_TABLET | Freq: Four times a day (QID) | ORAL | Status: DC | PRN
  Administered 2016-07-30: 650 mg via ORAL
  Filled 2016-07-30: qty 2

## 2016-07-30 MED ORDER — DULOXETINE HCL 60 MG PO CPEP
60.0000 mg | ORAL_CAPSULE | Freq: Every day | ORAL | Status: DC
  Administered 2016-07-30 – 2016-08-03 (×4): 60 mg via ORAL
  Filled 2016-07-30 (×4): qty 1

## 2016-07-30 MED ORDER — DOCUSATE SODIUM 100 MG PO CAPS
100.0000 mg | ORAL_CAPSULE | Freq: Two times a day (BID) | ORAL | Status: DC
  Administered 2016-07-30 – 2016-07-31 (×4): 100 mg via ORAL
  Filled 2016-07-30 (×4): qty 1

## 2016-07-30 MED ORDER — ACETAMINOPHEN 650 MG RE SUPP: 650.0000 mg | Freq: Four times a day (QID) | RECTAL | Status: DC | PRN

## 2016-07-30 MED ORDER — BACLOFEN 10 MG PO TABS
10.0000 mg | ORAL_TABLET | Freq: Three times a day (TID) | ORAL | Status: DC
  Administered 2016-07-30 – 2016-08-03 (×12): 10 mg via ORAL
  Filled 2016-07-30 (×12): qty 1

## 2016-07-30 NOTE — H&P (Signed)
Tina Sharp is an 50 y.o. female.   Chief Complaint: Fall HPI: The patient with past medical history of stroke presents emergency department after suffering a fall. The patient states that she lost her footing. She denies dizziness, weakness or imbalance. Notably she has been walking with a cane and a walker but is able to walk significant distances without either. After falling she was unable to get up but her daughter helped her to her feet and she walked to her bed at which point the pain was too great to bear. In the emergency department x-ray of the pelvis demonstrated dislocation of the right femoral neck which prompted the emergency department staff to call hospitalist service for admission.  Past Medical History:  Diagnosis Date  . Anxiety   . Breast cancer [facility]) June 2015   Invasive lobular carcinoma, 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 ER/ PR+; Her 2 neu not overexpressed, microscopic positive margin (skin).  . Depression   . GERD (gastroesophageal reflux disease)   . Hypertension   . 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 ABDOMINAL HYSTERECTOMY  2000   left both ovaries in  . BREAST SURGERY Left 04/08/14   mastectomy  . COLONOSCOPY    . EP IMPLANTABLE DEVICE N/A 06/14/2015   Procedure: Loop Recorder Insertion;  Surgeon: Evans Lance, MD;  Location: [place] [facility];  Service: Cardiovascular;  Laterality: N/A;  . LAPAROSCOPIC HYSTERECTOMY  2010   [facility]  . MASTECTOMY Left 2016  . REDUCTION MAMMAPLASTY Right 2016   Lift  . reduction mammoplasty Right 04/08/14   Dr Tula Nakayama  . TEE WITHOUT CARDIOVERSION N/A 06/14/2015   Procedure: TRANSESOPHAGEAL ECHOCARDIOGRAM (TEE);  Surgeon: Josue Hector, MD;  Location: [facility] ENDOSCOPY;  Service: Cardiovascular;  Laterality: N/A;  . TONSILLECTOMY AND ADENOIDECTOMY     pt was 50 years old    Family History  Problem Relation Age of Onset  .  Breast cancer Mother 51  . Ovarian cancer Other   . Stroke Paternal Grandmother   . Breast cancer Maternal Grandmother 56  . Cancer Maternal Grandfather 2    prostate  . Prostate cancer Maternal Grandfather    Social History:  reports that she quit smoking about 9 months ago. Her smoking use included Cigarettes. She smoked 0.25 packs per day. She has never used smokeless tobacco. She reports that she does not drink alcohol or use drugs.  Allergies: No Known Allergies  Medications Prior to Admission  Medication Sig Dispense Refill  . anastrozole (ARIMIDEX) 1 MG tablet Take 1 tablet (1 mg total) by mouth every evening. 7 tablet 0  . atorvastatin (LIPITOR) 40 MG tablet Take 1 tablet (40 mg total) by mouth daily at 6 PM. 30 tablet 2  . b complex vitamins tablet Take 1 tablet by mouth daily. 30 tablet 0  . baclofen (LIORESAL) 10 MG tablet Take 10 mg by mouth 3 (three) times daily.    . clonazePAM (KLONOPIN) 0.5 MG disintegrating tablet Take 0.5 mg by mouth 2 (two) times daily.    . cyanocobalamin 1000 MCG tablet Take 1 tablet (1,000 mcg total) by mouth daily. 30 tablet 2  . DULoxetine (CYMBALTA) 60 MG capsule Take 60 mg by mouth daily.    . folic acid (FOLVITE) 1 MG tablet Take 1 tablet (1 mg total) by mouth daily. 30 tablet 0  . gabapentin (NEURONTIN) 600 MG tablet  Take 600 mg by mouth 4 (four) times daily.    Marland Kitchen omeprazole (PRILOSEC) 20 MG capsule Take 20 mg by mouth as needed (heart burn).     . prochlorperazine (COMPAZINE) 5 MG tablet Take 5 mg by mouth every 6 (six) hours as needed for nausea or vomiting.    . warfarin (COUMADIN) 5 MG tablet Take 2.5-5 mg by mouth daily. Take 2.5 mg for 3 days, then take 5 mg until returning for lab draw.      Results for orders placed or performed during the hospital encounter of 07/30/16 (from the past 48 hour(s))  CBC with Differential     Status: Abnormal   Collection Time: 07/30/16  1:22 AM  Result Value Ref Range   WBC 7.3 3.6 - 11.0 K/uL   RBC  4.65 3.80 - 5.20 MIL/uL   Hemoglobin 13.5 12.0 - 16.0 g/dL   HCT 39.6 35.0 - 47.0 %   MCV 85.1 80.0 - 100.0 fL   MCH 29.1 26.0 - 34.0 pg   MCHC 34.2 32.0 - 36.0 g/dL   RDW 15.1 (H) 11.5 - 14.5 %   Platelets 271 150 - 440 K/uL   Neutrophils Relative % 61 %   Neutro Abs 4.4 1.4 - 6.5 K/uL   Lymphocytes Relative 29 %   Lymphs Abs 2.1 1.0 - 3.6 K/uL   Monocytes Relative 6 %   Monocytes Absolute 0.5 0.2 - 0.9 K/uL   Eosinophils Relative 2 %   Eosinophils Absolute 0.2 0 - 0.7 K/uL   Basophils Relative 2 %   Basophils Absolute 0.2 (H) 0 - 0.1 K/uL  Basic metabolic panel     Status: Abnormal   Collection Time: 07/30/16  1:22 AM  Result Value Ref Range   Sodium 141 135 - 145 mmol/L   Potassium 3.7 3.5 - 5.1 mmol/L   Chloride 107 101 - 111 mmol/L   CO2 28 22 - 32 mmol/L   Glucose, Bld 101 (H) 65 - 99 mg/dL   BUN 9 6 - 20 mg/dL   Creatinine, Ser 0.67 0.44 - 1.00 mg/dL   Calcium 9.0 8.9 - 10.3 mg/dL   GFR calc non Af Amer >60 >60 mL/min   GFR calc Af Amer >60 >60 mL/min    Comment: (NOTE) The eGFR has been calculated using the CKD EPI equation. This calculation has not been validated in all clinical situations. eGFR's persistently <60 mL/min signify possible Chronic Kidney Disease.    Anion gap 6 5 - 15  TSH     Status: None   Collection Time: 07/30/16  1:22 AM  Result Value Ref Range   TSH 1.310 0.350 - 4.500 uIU/mL    Comment: Performed by a 3rd Generation assay with a functional sensitivity of <=0.01 uIU/mL.  Protime-INR     Status: Abnormal   Collection Time: 07/30/16  3:02 AM  Result Value Ref Range   Prothrombin Time 26.6 (H) 11.4 - 15.2 seconds   INR 2.40    Dg Chest 1 View  Result Date: 07/30/2016 CLINICAL DATA:  50 year old female with preop evaluation. EXAM: CHEST 1 VIEW COMPARISON:  Chest radiograph dated 02/21/2016 FINDINGS: The lungs are clear. There is no pleural effusion or pneumothorax. The cardiac silhouette is within normal limits. Loop recorder device over  the left cardiac border again noted. No acute osseous pathology. IMPRESSION: No active disease. Electronically Signed   By: Anner Crete M.D.   On: 07/30/2016 03:06   Dg Hip Unilat With Pelvis 2-3  Views Right  Result Date: 07/30/2016 CLINICAL DATA:  [place] off of commode, RIGHT hip pain. History of breast cancer and stroke. EXAM: DG HIP (WITH OR WITHOUT PELVIS) 2-3V RIGHT COMPARISON:  None. FINDINGS: Acute impacted RIGHT femoral neck fracture with slight valgus angulation distal bony fragments. Femoral heads are located. Osteopenia without destructive bony lesions. Sacroiliac joints are symmetric. Phleboliths project in the pelvis. Soft tissue planes are nonsuspicious. IMPRESSION: Acute mildly displaced RIGHT femoral neck fracture without dislocation. Electronically Signed   By: Elon Alas M.D.   On: 07/30/2016 03:05    Review of Systems  Constitutional: Negative for chills and fever.  HENT: Negative for sore throat and tinnitus.   Eyes: Negative for blurred vision and redness.  Respiratory: Negative for cough and shortness of breath.   Cardiovascular: Negative for chest pain, palpitations, orthopnea and PND.  Gastrointestinal: Negative for abdominal pain, diarrhea, nausea and vomiting.  Genitourinary: Negative for dysuria, frequency and urgency.  Musculoskeletal: Positive for falls and joint pain. Negative for myalgias.  Skin: Negative for rash.       No lesions  Neurological: Negative for speech change, focal weakness and weakness.  Endo/Heme/Allergies: Does not bruise/bleed easily.       No temperature intolerance  Psychiatric/Behavioral: Negative for depression and suicidal ideas.    Blood pressure (!) 111/51, pulse 96, temperature 98 F (36.7 C), temperature source Oral, resp. rate 16, height _0  (1.575 m), weight 61.6 kg (135 lb 12.8 oz), SpO2 97 %. Physical Exam  Vitals reviewed. Constitutional: She is oriented to person, place, and time. She appears well-developed and  well-nourished. No distress.  HENT:  Head: Normocephalic and atraumatic.  Mouth/Throat: Oropharynx is clear and moist.  Eyes: Conjunctivae and EOM are normal. Pupils are equal, round, and reactive to light. No scleral icterus.  Neck: Normal range of motion. Neck supple. No JVD present. No tracheal deviation present. No thyromegaly present.  Cardiovascular: Normal rate, regular rhythm and normal heart sounds.  Exam reveals no gallop and no friction rub.   No murmur heard. Respiratory: Effort normal and breath sounds normal.  GI: Soft. Bowel sounds are normal. She exhibits no distension. There is no tenderness.  Genitourinary:  Genitourinary Comments: Deferred  Musculoskeletal: She exhibits no edema.  Right leg straightened and mildly angulated inward  Lymphadenopathy:    She has no cervical adenopathy.  Neurological: She is alert and oriented to person, place, and time. No cranial nerve deficit. She exhibits normal muscle tone.  Skin: Skin is warm and dry. No rash noted. No erythema.  Psychiatric: She has a normal mood and affect. Her behavior is normal. Judgment and thought content normal.     Assessment/Plan This is a 50 year old female admitted for hip fracture. 1. Hip fracture: Dislocation of right femur. Patient nothing by mouth and held her anticoagulation. Orthopedic service to see the patient for reduction of hip joint. 2. History of stroke: Manage blood pressure. Hold anticoagulation in anticipation of surgery. 3. History of breast cancer: Continue anastrozole 4. DVT prophylaxis: SCDs 5. GI prophylaxis: PPI per home regimen The patient is a full code. Time spent on admission was inpatient care possibly 45 minutes  Harrie Foreman, MD 07/30/2016, 8:30 AM

## 2016-07-30 NOTE — Progress Notes (Signed)
[place] [facility] [place] NAME: Tina Sharp    MR#:  [id number]  DATE OF BIRTH:  1965-12-12  SUBJECTIVE:   Patient here with a mechanical fall and noted to have a right hip fracture. Having some pain near the right hip. Husband at bedside. No other acute complaints.  REVIEW OF SYSTEMS:    Review of Systems  Constitutional: Negative for chills and fever.  HENT: Negative for congestion and tinnitus.   Eyes: Negative for blurred vision and double vision.  Respiratory: Negative for cough, shortness of breath and wheezing.   Cardiovascular: Negative for chest pain, orthopnea and PND.  Gastrointestinal: Negative for abdominal pain, diarrhea, nausea and vomiting.  Genitourinary: Negative for dysuria and hematuria.  Musculoskeletal: Positive for falls and joint pain (Right Hip).  Neurological: Negative for dizziness, sensory change and focal weakness.  All other systems reviewed and are negative.   Nutrition: Regular Tolerating Diet: Yes Tolerating PT: Await Eval.     DRUG ALLERGIES:  No Known Allergies  VITALS:  Blood pressure (!) 111/51, pulse 96, temperature 98 F (36.7 C), temperature source Oral, resp. rate 16, height 5\' 2"  (1.575 m), weight 61.6 kg (135 lb 12.8 oz), SpO2 97 %.  PHYSICAL EXAMINATION:   Physical Exam  GENERAL:  50 y.o.-year-old patient lying in the bed in mild distress.  EYES: Pupils equal, round, reactive to light and accommodation. No scleral icterus. Extraocular muscles intact.  HEENT: Head atraumatic, normocephalic. Oropharynx and nasopharynx clear.  NECK:  Supple, no jugular venous distention. No thyroid enlargement, no tenderness.  LUNGS: Normal breath sounds bilaterally, no wheezing, rales, rhonchi. No use of accessory muscles of respiration.  CARDIOVASCULAR: S1, S2 normal. No murmurs, rubs, or gallops.  ABDOMEN: Soft, nontender, nondistended. Bowel sounds present. No organomegaly or mass.  EXTREMITIES: No  cyanosis, clubbing or edema b/l. RLE shortened and ext. Rotated due to fracture.   NEUROLOGIC: Cranial nerves II through XII are intact. No focal Motor or sensory deficits b/l.   PSYCHIATRIC: The patient is alert and oriented x 3.  SKIN: No obvious rash, lesion, or ulcer.    LABORATORY PANEL:   CBC  Recent Labs Lab 07/30/16 0122  WBC 7.3  HGB 13.5  HCT 39.6  PLT 271   ------------------------------------------------------------------------------------------------------------------  Chemistries   Recent Labs Lab 07/30/16 0122  NA 141  K 3.7  CL 107  CO2 28  GLUCOSE 101*  BUN 9  CREATININE 0.67  CALCIUM 9.0   ------------------------------------------------------------------------------------------------------------------  Cardiac Enzymes No results for input(s): TROPONINI in the last 168 hours. ------------------------------------------------------------------------------------------------------------------  RADIOLOGY:  Dg Chest 1 View  Result Date: 07/30/2016 CLINICAL DATA:  50 year old female with preop evaluation. EXAM: CHEST 1 VIEW COMPARISON:  Chest radiograph dated 02/21/2016 FINDINGS: The lungs are clear. There is no pleural effusion or pneumothorax. The cardiac silhouette is within normal limits. Loop recorder device over the left cardiac border again noted. No acute osseous pathology. IMPRESSION: No active disease. Electronically Signed   By: Anner Crete M.D.   On: 07/30/2016 03:06   Dg Hip Unilat With Pelvis 2-3 Views Right  Result Date: 07/30/2016 CLINICAL DATA:  [place] off of commode, RIGHT hip pain. History of breast cancer and stroke. EXAM: DG HIP (WITH OR WITHOUT PELVIS) 2-3V RIGHT COMPARISON:  None. FINDINGS: Acute impacted RIGHT femoral neck fracture with slight valgus angulation distal bony fragments. Femoral heads are located. Osteopenia without destructive bony lesions. Sacroiliac joints are symmetric. Phleboliths project in the pelvis. Soft  tissue planes  are nonsuspicious. IMPRESSION: Acute mildly displaced RIGHT femoral neck fracture without dislocation. Electronically Signed   By: Elon Alas M.D.   On: 07/30/2016 03:05     ASSESSMENT AND PLAN:   50 year old female with previous history of CVA, TIA, attention, GERD, depression, history of breast cancer who presents to the hospital after a mechanical fall and noted to have a right hip fracture.   1. Preoperative cardiovascular examination-patient is a low risk for noncardiac surgery. -No absolute contraindication to surgery at this time. Continue to hold Coumadin for now.  2. Right hip fracture-await further orthopedic input. Continue pain control with IV morphine. -Patient is stable for surgery tomorrow.  3. History of previous breast cancer, continue Arimidex.  4. History of previous CVA-Coumadin on hold as patient to have surgery tomorrow. -Continue atorvastatin.  5. GERD-continue Protonix.  6. Depression/anxiety-continue Cymbalta, Klonopin.     All the records are reviewed and case discussed with Care Management/Social Worker. Management plans discussed with the patient, family and they are in agreement.  CODE STATUS: Full  DVT Prophylaxis: Coumadin  TOTAL TIME TAKING CARE OF THIS PATIENT: 30 minutes.   POSSIBLE D/C IN 2-3 DAYS, DEPENDING ON CLINICAL CONDITION.   Henreitta Leber M.D on 07/30/2016 at 2:59 PM  Between 7am to 6pm - Pager - [phone]  After 6pm go to [email or web address] - Proofreader  [facility] [place]  Office  [phone]  CC: Primary care physician; Chriss Czar, MD

## 2016-07-30 NOTE — ED Triage Notes (Signed)
Patient was walking going to bathroom in her [place] at home and she turned to the right next to her commode and fell on her right side and pt is co 10/10 pain from right hip down her knee.  She states she takes no pain meds or sleep meds at night.  EMS said they did not notice any rotation, shortening, or bruising on the right side.

## 2016-07-30 NOTE — Care Management Note (Signed)
Case Management Note  Patient Details  Name: Tina Sharp MRN: [id number] Date of Birth: 1966-04-05  Subjective/Objective:    [facility] notified of admission. They are providing PT and OT services. Patient lives at home with her spouse and her mother who help her. She had a stroke earlier this year.   She would like to go home. PT pending.              Action/Plan: Following.   Expected Discharge Date:  08/02/16               Expected Discharge Plan:  [place]  In-House Referral:     Discharge planning Services     Post Acute Care Choice:  Resumption of Svcs/PTA Provider, Home Health Choice offered to:  Patient  DME Arranged:    DME Agency:     HH Arranged:  OT, PT [place] Agency:     Status of Service:  In process, will continue to follow  If discussed at Long Length of Stay Meetings, dates discussed:    Additional Comments:  Jolly Mango, RN 07/30/2016, 3:54 PM

## 2016-07-30 NOTE — Consult Note (Signed)
ORTHOPAEDIC CONSULTATION  PATIENT NAME: Tina Sharp DOB: 1966-04-05  MRN: [id number]  REQUESTING PHYSICIAN: Henreitta Leber, MD  Chief Complaint: Right hip pain  HPI: Tina Sharp is a 50 y.o. female who complains of  right hip pain. The patient was walking into the bathroom and was turning when she tripped and fell, landing on her right hip and leg. She had the immediate onset of severe right hip and groin pain and had difficulty with weightbearing to the right lower extremity due to the hip pain. She denied any loss of consciousness or dizziness. She denied any other injuries. Prior to the fall she was ambulatory with either a walker or cane. She has been receiving home physical therapy following a CVA earlier in the year. She denies any numbness or weakness, although she does have a history of paresthesias to both feet in the right upper extremity as residual from the CVA.  Past Medical History:  Diagnosis Date  . Anxiety   . Breast cancer [facility]) June 2015   Invasive lobular carcinoma, 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 ER/ PR+; Her 2 neu not overexpressed, microscopic positive margin (skin).  . Depression   . GERD (gastroesophageal reflux disease)   . Hypertension   . 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 ABDOMINAL HYSTERECTOMY  2000   left both ovaries in  . BREAST SURGERY Left 04/08/14   mastectomy  . COLONOSCOPY    . EP IMPLANTABLE DEVICE N/A 06/14/2015   Procedure: Loop Recorder Insertion;  Surgeon: Evans Lance, MD;  Location: [place] [facility];  Service: Cardiovascular;  Laterality: N/A;  . LAPAROSCOPIC HYSTERECTOMY  2010   [facility]  . MASTECTOMY Left 2016  . REDUCTION MAMMAPLASTY Right 2016   Lift  . reduction mammoplasty Right 04/08/14   Dr Tula Nakayama  . TEE WITHOUT CARDIOVERSION N/A 06/14/2015   Procedure: TRANSESOPHAGEAL ECHOCARDIOGRAM (TEE);  Surgeon: Josue Hector,  MD;  Location: [facility] ENDOSCOPY;  Service: Cardiovascular;  Laterality: N/A;  . TONSILLECTOMY AND ADENOIDECTOMY     pt was 50 years old   Social History   Social History  . Marital status: Married    Spouse name: N/A  . Number of children: N/A  . Years of education: N/A   Social History Main Topics  . Smoking status: Former Smoker    Packs/day: 0.25    Types: Cigarettes    Quit date: 10/12/2015  . Smokeless tobacco: Never Used     Comment: E cig  . Alcohol use No     Comment: social  . Drug use: No  . Sexual activity: Yes   Other Topics Concern  . None   Social History Narrative  . None   Family History  Problem Relation Age of Onset  . Breast cancer Mother 53  . Ovarian cancer Other   . Stroke Paternal Grandmother   . Breast cancer Maternal Grandmother 81  . Cancer Maternal Grandfather 38    prostate  . Prostate cancer Maternal Grandfather    No Known Allergies Prior to Admission medications   Medication Sig Start Date End Date Taking? Authorizing Provider  anastrozole (ARIMIDEX) 1 MG tablet Take 1 tablet (1 mg total) by mouth every evening. 06/15/15  Yes Cammie Sickle, MD  atorvastatin (LIPITOR) 40 MG tablet Take 1 tablet (40 mg total) by mouth daily at 6 PM. 11/09/15  Yes Gladstone Lighter, MD  b complex vitamins tablet Take 1 tablet by mouth daily. 11/09/15  Yes Gladstone Lighter, MD  baclofen (LIORESAL) 10 MG tablet Take 10 mg by mouth 3 (three) times daily.   Yes Historical Provider, MD  clonazePAM (KLONOPIN) 0.5 MG disintegrating tablet Take 0.5 mg by mouth 2 (two) times daily. 07/28/16  Yes Historical Provider, MD  cyanocobalamin 1000 MCG tablet Take 1 tablet (1,000 mcg total) by mouth daily. 11/09/15  Yes Gladstone Lighter, MD  DULoxetine (CYMBALTA) 60 MG capsule Take 60 mg by mouth daily. 07/21/16  Yes Historical Provider, MD  folic acid (FOLVITE) 1 MG tablet Take 1 tablet (1 mg total) by mouth daily. 06/22/15  Yes Kelvin Cellar, MD  gabapentin (NEURONTIN) 600  MG tablet Take 600 mg by mouth 4 (four) times daily. 07/22/16  Yes Historical Provider, MD  omeprazole (PRILOSEC) 20 MG capsule Take 20 mg by mouth as needed (heart burn).    Yes Historical Provider, MD  prochlorperazine (COMPAZINE) 5 MG tablet Take 5 mg by mouth every 6 (six) hours as needed for nausea or vomiting.   Yes Historical Provider, MD  warfarin (COUMADIN) 5 MG tablet Take 2.5-5 mg by mouth daily. Take 2.5 mg for 3 days, then take 5 mg until returning for lab draw. 07/26/16  Yes Historical Provider, MD   Dg Chest 1 View  Result Date: 07/30/2016 CLINICAL DATA:  50 year old female with preop evaluation. EXAM: CHEST 1 VIEW COMPARISON:  Chest radiograph dated 02/21/2016 FINDINGS: The lungs are clear. There is no pleural effusion or pneumothorax. The cardiac silhouette is within normal limits. Loop recorder device over the left cardiac border again noted. No acute osseous pathology. IMPRESSION: No active disease. Electronically Signed   By: Anner Crete M.D.   On: 07/30/2016 03:06   Dg Hip Unilat With Pelvis 2-3 Views Right  Result Date: 07/30/2016 CLINICAL DATA:  [place] off of commode, RIGHT hip pain. History of breast cancer and stroke. EXAM: DG HIP (WITH OR WITHOUT PELVIS) 2-3V RIGHT COMPARISON:  None. FINDINGS: Acute impacted RIGHT femoral neck fracture with slight valgus angulation distal bony fragments. Femoral heads are located. Osteopenia without destructive bony lesions. Sacroiliac joints are symmetric. Phleboliths project in the pelvis. Soft tissue planes are nonsuspicious. IMPRESSION: Acute mildly displaced RIGHT femoral neck fracture without dislocation. Electronically Signed   By: Elon Alas M.D.   On: 07/30/2016 03:05    Positive ROS: All other systems have been reviewed and were otherwise negative with the exception of those mentioned in the HPI and as above.  Physical Exam: General: Alert and alert in no acute distress. HEENT: Atraumatic and normocephalic. Sclera are  clear. Extraocular motion is intact. Oropharynx is clear with moist mucosa. Neck: Supple, nontender, good range of motion. No JVD or carotid bruits. Lungs: Clear to auscultation bilaterally. Cardiovascular: Regular rate and rhythm with normal S1 and S2. No murmurs. No gallops or rubs. Pedal pulses are palpable bilaterally. Homans test is negative bilaterally. No significant pretibial or ankle edema. Abdomen: Soft, nontender, and nondistended. Bowel sounds are present. Skin: No lesions in the area of chief complaint Neurologic: Awake, alert, and oriented. Sensory function is grossly intact. Motor strength is felt to be 5 over 5 bilaterally. No clonus or tremor. Good motor coordination. Lymphatic: No axillary or cervical lymphadenopathy  MUSCULOSKELETAL: Good range of motion and stability of the upper extremities. No tenderness to palpation.  Examination of the lower she missed shows the right lower extremity be somewhat shortened and rotated. Pain is elicited with any attempted  range of motion of the right hip. No tenderness to palpation about the knee or ankle. No knee effusion.  Assessment: Right femoral neck fracture  Plan: The findings were discussed in detail with the patient and her family. Recommendations made for right hip hemiarthroplasty.. The usual perioperative course was discussed. The risks and benefits of surgical intervention were reviewed. The patient expressed understanding of the risks and benefits and agreed with plans for surgical intervention.   The patient is therapeutic on Coumadin with an INR of 2.4. Coumadin has been held to allow for normalization of the INR in anticipation of surgery. Target INR for surgery is 1.4.  Briyonna Omara P. Holley Bouche M.D.

## 2016-07-30 NOTE — ED Notes (Signed)
Patient transported to X-ray 

## 2016-07-30 NOTE — ED Notes (Signed)
Patient straight stuck and redrawn blue top sent to lab, [facility] notified it is on the way.

## 2016-07-30 NOTE — Progress Notes (Signed)
Nutrition Brief Note  Patient identified on the Malnutrition Screening Tool (MST) Report  Wt Readings from Last 15 Encounters:  07/30/16 135 lb 12.8 oz (61.6 kg)  06/13/16 132 lb (59.9 kg)  04/09/16 123 lb 12.8 oz (56.2 kg)  02/21/16 117 lb (53.1 kg)  02/21/16 117 lb (53.1 kg)  01/03/16 116 lb (52.6 kg)  11/07/15 145 lb (65.8 kg)  11/04/15 146 lb (66.2 kg)  09/27/15 137 lb 12.8 oz (62.5 kg)  09/06/15 144 lb (65.3 kg)  09/05/15 143 lb (64.9 kg)  08/16/15 143 lb (64.9 kg)  07/12/15 142 lb 3.2 oz (64.5 kg)  06/21/15 147 lb 1.6 oz (66.7 kg)  06/20/15 142 lb 6.7 oz (64.6 kg)    Body mass index is 24.84 kg/m. Patient meets criteria for normal based on current BMI.   Pt states she had a stroke and lost weight down to 112# but has since regained a significant amount of weight. Nutrition-Focused physical exam completed. Findings are no fat depletion, no muscle depletion, and no edema.   Current diet order is regular, patient is consuming approximately 100% of meals at this time. Labs and medications reviewed.   No nutrition interventions warranted at this time. If nutrition issues arise, please consult RD.   Satira Anis. Toleen Lachapelle, MS, RD LDN Inpatient Clinical Dietitian Pager [phone]

## 2016-07-30 NOTE — Clinical Social Work Note (Signed)
Clinical Social Work Assessment  Patient Details  Name: Tina Sharp MRN: [id number] Date of Birth: April 02, 1966  Date of referral:  07/30/16               Reason for consult:  Facility Placement                Permission sought to share information with:  Chartered certified accountant granted to share information::  Yes, Verbal Permission Granted  Name::      [place]::   [place]   Relationship::     Contact Information:     Housing/Transportation Living arrangements for the past 2 months:  [place] of Information:  Patient, Spouse Patient Interpreter Needed:  None Criminal Activity/Legal Involvement Pertinent to Current Situation/Hospitalization:  No - Comment as needed Significant Relationships:  Adult Children, Spouse, Other Family Members, Parents Lives with:  Spouse Do you feel safe going back to the place where you live?  Yes Need for family participation in patient care:  Yes (Comment)  Care giving concerns:  Patient lives in [place] with her husband Tina Sharp [phone].    Social Worker assessment / plan:  Per RN in progression rounds patient has a hip fracture and will go for surgery tomorrow due to INR level. Clinical Social Worker (CSW) met with patient and her husband Tina Sharp at bedside today prior to surgery. CSW introduced self and explained role of CSW department. Patient was alert and oriented X4. Patient reported that she had a stroke and went to [facility] for rehab from March 2017 to June 2017. Per patient she walks with a walker at baseline and is receiving home health services currently. Husband reported that he works but can take [employer]. Patient reported that she has 2 adults sons ages 6 and 63 that also provide support. Patient reported that her mother is retired and she is currently staying with her because she can provide 24/7 assistance. CSW explained that PT will work with patient after  surgery and recommend home health or SNF. Patient reported that she really wants to go home and has plenty of support. Per patient she would consider going back to [facility] if she had too but is preferring home. RN case manager aware of above. CSW will continue to follow and assist as needed.   Employment status:  Disabled (Comment on whether or not currently receiving Disability) Insurance information:  Managed Care PT Recommendations:  Not assessed at this time Information / Referral to community resources:  Other (Comment Required) (SNF Vs. HH)  Patient/Family's Response to care:  Patient prefers to go home after surgery.   Patient/Family's Understanding of and Emotional Response to Diagnosis, Current Treatment, and Prognosis:  Patient and husband were very pleasant and thanked CSW for visit.   Emotional Assessment Appearance:  Appears stated age Attitude/Demeanor/Rapport:    Affect (typically observed):  Accepting, Adaptable, Pleasant Orientation:  Oriented to Self, Oriented to Place, Oriented to  Time, Oriented to Situation Alcohol / Substance use:  Not Applicable Psych involvement (Current and /or in the community):  No (Comment)  Discharge Needs  Concerns to be addressed:  Discharge Planning Concerns Readmission within the last 30 days:  No Current discharge risk:  Dependent with Mobility Barriers to Discharge:  Continued Medical Work up   [employer], Tina Beets, LCSW 07/30/2016, 11:49 AM

## 2016-07-30 NOTE — ED Provider Notes (Signed)
[facility] Emergency Department Provider Note   ____________________________________________   First MD Initiated Contact with Patient 07/30/16 0109     (approximate)  I have reviewed the triage vital signs and the nursing notes.   HISTORY  Chief Complaint Fall    HPI Tina Sharp is a 50 y.o. female who presents to the ED from home via EMS with a chief complaint of fall with right hip pain. Patient has a history of CVA on warfarin who was ambulating with her walker to the restroom when she pivoted, lost her balance and fell, striking her right hip. Denies striking head or LOC. States she did ambulate with a limp to the EMS stretcher. Denies associated headache, neck pain, vision changes, chest pain, shortness of breath, abdominal pain, nausea, vomiting, diarrhea. Denies recent travel. Nothing makes her symptoms better. Movement makes her symptoms worse.   Past Medical History:  Diagnosis Date  . Anxiety   . 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 ER/ PR+; Her 2 neu not overexpressed, microscopic positive margin (skin).  . Depression   . GERD (gastroesophageal reflux disease)   . Hypertension   . 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 Neuropathy ([place]) 01/03/2016  . Ataxia 01/03/2016  . Myalgia 09/08/2015  . Depression 09/06/2015  . Cerebrovascular accident (CVA) due to thrombosis of posterior cerebral artery ([place]) 08/18/2015  . 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 Stroke with cerebral ischemia ([place]) 07/12/2015  . HLD (hyperlipidemia) 07/12/2015  . CVA (cerebral infarction) 06/21/2015  . TIA (transient ischemic attack) 06/21/2015  . Macrocytic anemia 06/20/2015  . Headache 06/14/2015  . Cigarette smoker 06/14/2015  . Stroke (cerebrum) ([place]) cryptogenic R brain infarcts s/p IV tPA   06/11/2015  . Malignant neoplasm of breast (female), unspecified site 04/23/2014    Past Surgical History:  Procedure Laterality Date  . ABDOMINAL HYSTERECTOMY  2000   left both ovaries in  . BREAST SURGERY Left 04/08/14   mastectomy  . COLONOSCOPY    . EP IMPLANTABLE DEVICE N/A 06/14/2015   Procedure: Loop Recorder Insertion;  Surgeon: Evans Lance, MD;  Location: [facility];  Service: Cardiovascular;  Laterality: N/A;  . LAPAROSCOPIC HYSTERECTOMY  2010   [facility]  . MASTECTOMY Left 2016  . REDUCTION MAMMAPLASTY Right 2016   Lift  . reduction mammoplasty Right 04/08/14   Dr Tula Nakayama  . TEE WITHOUT CARDIOVERSION N/A 06/14/2015   Procedure: TRANSESOPHAGEAL ECHOCARDIOGRAM (TEE);  Surgeon: Josue Hector, MD;  Location: [facility] ENDOSCOPY;  Service: Cardiovascular;  Laterality: N/A;  . TONSILLECTOMY AND ADENOIDECTOMY     pt was 50 years old    Prior to Admission medications   Medication Sig Start Date End Date Taking? Authorizing Provider  anastrozole (ARIMIDEX) 1 MG tablet Take 1 tablet (1 mg total) by mouth every evening. 06/15/15   Cammie Sickle, MD  atorvastatin (LIPITOR) 40 MG tablet Take 1 tablet (40 mg total) by mouth daily at 6 PM. 11/09/15   Gladstone Lighter, MD  b complex vitamins tablet Take 1 tablet by mouth daily. 11/09/15   Gladstone Lighter, MD  baclofen (LIORESAL) 10 MG tablet Take 10 mg by mouth 3 (three) times daily.    Historical Provider, MD  clonazePAM (KLONOPIN) 0.5 MG tablet Take 1 tablet (0.5 mg total) by mouth 2 (two) times daily  as needed for anxiety. 11/09/15   Gladstone Lighter, MD  cyanocobalamin 1000 MCG tablet Take 1 tablet (1,000 mcg total) by mouth daily. 11/09/15   Gladstone Lighter, MD  DULoxetine (CYMBALTA) 20 MG capsule Take 20 mg by mouth daily.    Historical Provider, MD  folic acid (FOLVITE) 1 MG tablet Take 1 tablet (1 mg total) by mouth daily. 06/22/15   Kelvin Cellar, MD  gabapentin (NEURONTIN) 100 MG capsule Take 1 capsule (100 mg  total) by mouth 2 (two) times daily. 11/09/15   Gladstone Lighter, MD  omeprazole (PRILOSEC) 20 MG capsule Take 20 mg by mouth as needed.    Historical Provider, MD  prochlorperazine (COMPAZINE) 5 MG tablet Take 5 mg by mouth every 6 (six) hours as needed for nausea or vomiting.    Historical Provider, MD  solifenacin (VESICARE) 5 MG tablet Take 5 mg by mouth daily. Take after supper    Historical Provider, MD  traMADol (ULTRAM) 50 MG tablet Take 1 tablet (50 mg total) by mouth every 6 (six) hours as needed for moderate pain. 11/09/15   Gladstone Lighter, MD  warfarin (COUMADIN) 3 MG tablet Take 1 tablet (3 mg total) by mouth daily at 6 PM. 11/09/15   Gladstone Lighter, MD    Allergies Patient has no known allergies.  Family History  Problem Relation Age of Onset  . Breast cancer Mother 68  . Ovarian cancer Other   . Stroke Paternal Grandmother   . Breast cancer Maternal Grandmother 61  . Cancer Maternal Grandfather 4    prostate  . Prostate cancer Maternal Grandfather     Social History Social History  Substance Use Topics  . Smoking status: Former Smoker    Packs/day: 0.25    Types: Cigarettes    Quit date: 10/12/2015  . Smokeless tobacco: Never Used     Comment: E cig  . Alcohol use No     Comment: social    Review of Systems  Constitutional: No fever/chills. Eyes: No visual changes. ENT: No sore throat. Cardiovascular: Denies chest pain. Respiratory: Denies shortness of breath. Gastrointestinal: No abdominal pain.  No nausea, no vomiting.  No diarrhea.  No constipation. Genitourinary: Negative for dysuria. Musculoskeletal: Positive for right hip pain. Negative for back pain. Skin: Negative for rash. Neurological: Negative for headaches, focal weakness or numbness.  10-point ROS otherwise negative.  ____________________________________________   PHYSICAL EXAM:  VITAL SIGNS: ED Triage Vitals  Enc Vitals Group     BP 07/30/16 0103 (!) 147/90     Pulse Rate  07/30/16 0103 83     Resp 07/30/16 0103 16     Temp 07/30/16 0103 98 F (36.7 C)     Temp Source 07/30/16 0103 Oral     SpO2 07/30/16 0103 96 %     Weight 07/30/16 0106 135 lb 12.8 oz (61.6 kg)     Height 07/30/16 0106 5\' 2"  (1.575 m)     Head Circumference --      [facility] Flow --      Pain Score 07/30/16 0106 10     Pain Loc --      Pain Edu? --      Excl. in [place]? --     Constitutional: Alert and oriented. Chronically ill appearing and in mild acute distress. Eyes: Conjunctivae are normal. PERRL. EOMI. Head: Atraumatic. Nose: No congestion/rhinnorhea. Mouth/Throat: Mucous membranes are moist.  Oropharynx non-erythematous. Neck: No stridor.  No cervical spine tenderness to palpation. Cardiovascular: Normal rate, regular rhythm.  Grossly normal heart sounds.  Good peripheral circulation. Respiratory: Normal respiratory effort.  No retractions. Lungs CTAB. Gastrointestinal: Soft and nontender. No distention. No abdominal bruits. No CVA tenderness. Musculoskeletal: Pelvis stable. Right hip tender to palpation. There is no shortening or rotation. Limited range of motion secondary to pain.  Neurologic:  Normal speech and language. No gross focal neurologic deficits are appreciated.  Skin:  Skin is warm, dry and intact. No rash noted. Psychiatric: Mood and affect are normal. Speech and behavior are normal.  ____________________________________________   LABS (all labs ordered are listed, but only abnormal results are displayed)  Labs Reviewed  CBC WITH DIFFERENTIAL/PLATELET - Abnormal; Notable for the following:       Result Value   RDW 15.1 (*)    Basophils Absolute 0.2 (*)    All other components within normal limits  BASIC METABOLIC PANEL - Abnormal; Notable for the following:    Glucose, Bld 101 (*)    All other components within normal limits  PROTIME-INR - Abnormal; Notable for the following:    Prothrombin Time 26.6 (*)    All other components within normal limits    ____________________________________________  EKG  ED ECG REPORT I, Sylvia Helms J, the attending physician, personally viewed and interpreted this ECG.   Date: 07/30/2016  EKG Time: 0220  Rate: 94  Rhythm: normal EKG, normal sinus rhythm  Axis: Normal  Intervals:none  ST&T Change: Nonspecific  ____________________________________________  RADIOLOGY  Right hip x-rays (viewed by me, interpreted per Dr. Dorann Lodge): Acute mildly displaced RIGHT femoral neck fracture without  dislocation.   Chest 1 view (viewed by me, interpreted per Dr. Quintella Reichert): No active disease. ____________________________________________   PROCEDURES  Procedure(s) performed: None  Procedures  Critical Care performed: No  ____________________________________________   INITIAL IMPRESSION / ASSESSMENT AND PLAN / ED COURSE  Pertinent labs & imaging results that were available during my care of the patient were reviewed by me and considered in my medical decision making (see chart for details).  50 year old female who presents with right hip pain status post mechanical fall. Will administer IV analgesia, obtain x-rays; will check basic lab work and INR.  Clinical Course as of Jul 31 323  Mon Jul 30, 2016  [id number] Delay secondary to obtaining radiology read and INR. They did patient of imaging results. Discussed with orthopedic surgery and hospitalist for evaluation in the emergency department for admission.  [JS]    Clinical Course User Index [JS] Paulette Blanch, MD     ____________________________________________   FINAL CLINICAL IMPRESSION(S) / ED DIAGNOSES  Final diagnoses:  Fall, initial encounter  Closed displaced fracture of right femoral neck ([place])      NEW MEDICATIONS STARTED DURING THIS VISIT:  New Prescriptions   No medications on file     Note:  This document was prepared using Dragon voice recognition software and may include unintentional dictation errors.    Paulette Blanch, MD 07/30/16 [phone]

## 2016-07-30 NOTE — ED Notes (Signed)
Blue top redrawn, lab says the first one was not full.

## 2016-07-30 NOTE — ED Notes (Signed)
ED Provider at bedside. 

## 2016-07-30 NOTE — NC FL2 (Signed)
[place] LEVEL OF CARE SCREENING TOOL     IDENTIFICATION  Patient Name: Tina Sharp Birthdate: 1966-05-09 Sex: female Admission Date (Current Location): 07/30/2016  Reader and [place] Number:  Engineering geologist and Address:  [facility], [address] [place], [place] [address]      Provider Number: [id number]  Attending Physician Name and Address:  Henreitta Leber, MD  Relative Name and Phone Number:       Current Level of Care: Hospital Recommended Level of Care: [place] Prior Approval Number:    Date Approved/Denied:   PASRR Number:  ([id number] )  Discharge Plan: SNF    Current Diagnoses: Patient Active Problem List   Diagnosis Date Noted  . Hip fracture ([place]) 07/30/2016  . Neuropathy ([place]) 01/03/2016  . Ataxia 01/03/2016  . Myalgia 09/08/2015  . Depression 09/06/2015  . Cerebrovascular accident (CVA) due to thrombosis of posterior cerebral artery ([place]) 08/18/2015  . 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 Stroke with cerebral ischemia ([place]) 07/12/2015  . HLD (hyperlipidemia) 07/12/2015  . CVA (cerebral infarction) 06/21/2015  . TIA (transient ischemic attack) 06/21/2015  . Macrocytic anemia 06/20/2015  . Headache 06/14/2015  . Cigarette smoker 06/14/2015  . Stroke (cerebrum) ([place]) cryptogenic R brain infarcts s/p IV tPA  06/11/2015  . Malignant neoplasm of breast (female), unspecified site 04/23/2014    Orientation RESPIRATION BLADDER Height & Weight     Self, Time, Situation, Place  Normal Continent Weight: 135 lb 12.8 oz (61.6 kg) Height:  5\' 2"  (157.5 cm)  BEHAVIORAL SYMPTOMS/MOOD NEUROLOGICAL BOWEL NUTRITION STATUS   (none)  (History of stroke) Continent Diet (Regular Diet )  AMBULATORY STATUS COMMUNICATION OF NEEDS Skin   Extensive Assist Verbally Surgical wounds                       Personal Care Assistance Level of Assistance  Bathing, Feeding, Dressing Bathing  Assistance: Limited assistance Feeding assistance: Independent Dressing Assistance: Limited assistance     Functional Limitations Info  Sight, Hearing, Speech Sight Info: Adequate Hearing Info: Adequate Speech Info: Adequate    SPECIAL CARE FACTORS FREQUENCY  PT (By licensed PT), OT (By licensed OT)     PT Frequency:  (5) OT Frequency:  (5)            Contractures      Additional Factors Info  Code Status, Allergies Code Status Info:  (Full Code. ) Allergies Info:  (No Known Allergies. )           Current Medications (07/30/2016):  This is the current hospital active medication list Current Facility-Administered Medications  Medication Dose Route Frequency Provider Last Rate Last Dose  . 0.9 %  sodium chloride infusion   Intravenous Continuous Harrie Foreman, MD 125 mL/hr at 07/30/16 [phone]    . acetaminophen (TYLENOL) tablet 650 mg  650 mg Oral Q6H PRN Harrie Foreman, MD       Or  . acetaminophen (TYLENOL) suppository 650 mg  650 mg Rectal Q6H PRN Harrie Foreman, MD      . anastrozole (ARIMIDEX) tablet 1 mg  1 mg Oral QPM Harrie Foreman, MD      . atorvastatin (LIPITOR) tablet 40 mg  40 mg Oral q1800 Harrie Foreman, MD      . baclofen (LIORESAL) tablet 10 mg  10 mg Oral TID Harrie Foreman, MD   10 mg at 07/30/16 [id number]  . clonazepam (  KLONOPIN) disintegrating tablet 0.5 mg  0.5 mg Oral BID Harrie Foreman, MD   0.5 mg at 07/30/16 [id number]  . docusate sodium (COLACE) capsule 100 mg  100 mg Oral BID Harrie Foreman, MD   100 mg at 07/30/16 [id number]  . DULoxetine (CYMBALTA) DR capsule 60 mg  60 mg Oral Daily Harrie Foreman, MD   60 mg at 07/30/16 0919  . folic acid (FOLVITE) tablet 1 mg  1 mg Oral Daily Harrie Foreman, MD   1 mg at 07/30/16 [id number]  . gabapentin (NEURONTIN) tablet 600 mg  600 mg Oral QID Harrie Foreman, MD   600 mg at 07/30/16 [id number]  . HYDROcodone-acetaminophen (NORCO) 10-325 MG per tablet 1 tablet  1 tablet Oral Q6H PRN Henreitta Leber, MD    1 tablet at 07/30/16 0919  . morphine 2 MG/ML injection 2 mg  2 mg Intravenous Q4H PRN Henreitta Leber, MD   2 mg at 07/30/16 0751  . multivitamin (RENA-VIT) tablet 1 tablet  1 tablet Oral Daily Harrie Foreman, MD   1 tablet at 07/30/16 [phone]  . pantoprazole (PROTONIX) EC tablet 40 mg  40 mg Oral Daily Harrie Foreman, MD   40 mg at 07/30/16 [id number]  . prochlorperazine (COMPAZINE) tablet 5 mg  5 mg Oral Q6H PRN Harrie Foreman, MD      . vitamin B-12 (CYANOCOBALAMIN) tablet 1,000 mcg  1,000 mcg Oral Daily Harrie Foreman, MD   1,000 mcg at 07/30/16 [id number]     Discharge Medications: Please see discharge summary for a list of discharge medications.  Relevant Imaging Results:  Relevant Lab Results:   Additional Information  (SSN: [id number])  Bond Grieshop, Veronia Beets, LCSW

## 2016-07-31 LAB — HEMOGLOBIN A1C
Hgb A1c MFr Bld: 5.6 % (ref 4.8–5.6)
Mean Plasma Glucose: 114 mg/dL

## 2016-07-31 LAB — CALCITRIOL (1,25 DI-OH VIT D): Vit D, 1,25-Dihydroxy: 34.3 pg/mL (ref 19.9–79.3)

## 2016-07-31 LAB — PROTIME-INR
INR: 2.78
PROTHROMBIN TIME: 29.9 s — AB (ref 11.4–15.2)

## 2016-07-31 MED ORDER — SODIUM CHLORIDE 0.9 % IV SOLN: INTRAVENOUS | Status: AC

## 2016-07-31 MED ORDER — VITAMIN K1 10 MG/ML IJ SOLN
10.0000 mg | Freq: Once | INTRAMUSCULAR | Status: AC
  Administered 2016-07-31: 10 mg via SUBCUTANEOUS
  Filled 2016-07-31: qty 1

## 2016-07-31 NOTE — Progress Notes (Signed)
  Subjective:    Patient reports pain as severe.   Patient seen in rounds with Dr. Marry Guan. Patient is recently admitted for Right hip fracture Plan is to go Home versus rehabilitation after surgery. Negative for chest pain and shortness of breath Fever: 99.7 Gastrointestinal: Negative for nausea and vomiting  Objective: Vital signs in last 24 hours: Temp:  [97.7 F (36.5 C)-99.7 F (37.6 C)] 99.7 F (37.6 C) (12/19 0414) Pulse Rate:  [77-103] 103 (12/19 0414) Resp:  [16-18] 18 (12/19 0414) BP: (99-112)/(53-64) 99/56 (12/19 0414) SpO2:  [94 %-98 %] 95 % (12/19 0414) Weight:  [64.8 kg (142 lb 12.8 oz)] 64.8 kg (142 lb 12.8 oz) (12/19 0414)  Intake/Output from previous day:  Intake/Output Summary (Last 24 hours) at 07/31/16 0706 Last data filed at 07/31/16 0415  Gross per 24 hour  Intake             4630 ml  Output              300 ml  Net             4330 ml    Intake/Output this shift: No intake/output data recorded.  Labs:  Recent Labs  07/30/16 0122  HGB 13.5    Recent Labs  07/30/16 0122  WBC 7.3  RBC 4.65  HCT 39.6  PLT 271    Recent Labs  07/30/16 0122  NA 141  K 3.7  CL 107  CO2 28  BUN 9  CREATININE 0.67  GLUCOSE 101*  CALCIUM 9.0    Recent Labs  07/30/16 0302  INR 2.40     EXAM General - Patient is Alert and Oriented Extremity - Neurovascular intact Sensation intact distally  Right hip held in mild flexion for comfort. No Buck's traction. Dressing/Incision - no surgical incision Motor Function - intact, moving foot and toes well on exam.   Past Medical History:  Diagnosis Date  . Anxiety   . 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 ER/ PR+; Her 2 neu not overexpressed, microscopic positive margin (skin).  . Depression   . GERD (gastroesophageal reflux disease)   . Hypertension   . 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   Weight as of this encounter: 64.8 kg (142 lb 12.8 oz). Awaiting normalizing INR level. Probable surgery tomorrow.  DVT Prophylaxis - Foot Pumps No ambulation  Reche Dixon, PA-C Orthopaedic Surgery 07/31/2016, 7:06 AM

## 2016-07-31 NOTE — Progress Notes (Signed)
Shift assessment completed at 0800. Pt resting in bed and appeared to be crying but no tears noted, stated her R leg hurt, and she didn't know about surgery today, then pt stated that surgeon had been in and surgery would not be today. Pt is alert and oriented x4, lungs are clear bilat, pt is on room air. S1S2 auscultated. Abdomen is soft, bs heard. Foley draining clear yellow urine,ppp, cap refill to both toes less than 3 seconds. Pt has R leg drawn up--knee bent. This Probation officer explained that is pt would allow her r leg to be straightened, she would have less overall pain to bring everything into alignment, pt refused. PIV #22 intact to R arm with iv ns infusing at 169mls/hr, site free of redness and swelling. Since assessment, pt has received pain medication with am meds, Dr. Verdell Carmine has rounded on pt. Call bell in reach, visitor at bedside.

## 2016-07-31 NOTE — Progress Notes (Signed)
[place] at [place] NAME: Tina Sharp    MR#:  [id number]  DATE OF BIRTH:  05/15/1966  SUBJECTIVE:   Patient here with a mechanical fall and noted to have a right hip fracture. Surgery postponed today due to INR being elevated.   REVIEW OF SYSTEMS:    Review of Systems  Constitutional: Negative for chills and fever.  HENT: Negative for congestion and tinnitus.   Eyes: Negative for blurred vision and double vision.  Respiratory: Negative for cough, shortness of breath and wheezing.   Cardiovascular: Negative for chest pain, orthopnea and PND.  Gastrointestinal: Negative for abdominal pain, diarrhea, nausea and vomiting.  Genitourinary: Negative for dysuria and hematuria.  Musculoskeletal: Positive for falls and joint pain (Right Hip).  Neurological: Negative for dizziness, sensory change and focal weakness.  All other systems reviewed and are negative.   Nutrition: Regular Tolerating Diet: Yes Tolerating PT: Await Eval.     DRUG ALLERGIES:  No Known Allergies  VITALS:  Blood pressure (!) 96/48, pulse 98, temperature 99.7 F (37.6 C), temperature source Oral, resp. rate 18, height 5\' 2"  (1.575 m), weight 64.8 kg (142 lb 12.8 oz), SpO2 93 %.  PHYSICAL EXAMINATION:   Physical Exam  GENERAL:  50 y.o.-year-old patient lying in the bed in mild distress.  EYES: Pupils equal, round, reactive to light and accommodation. No scleral icterus. Extraocular muscles intact.  HEENT: Head atraumatic, normocephalic. Oropharynx and nasopharynx clear.  NECK:  Supple, no jugular venous distention. No thyroid enlargement, no tenderness.  LUNGS: Normal breath sounds bilaterally, no wheezing, rales, rhonchi. No use of accessory muscles of respiration.  CARDIOVASCULAR: S1, S2 normal. No murmurs, rubs, or gallops.  ABDOMEN: Soft, nontender, nondistended. Bowel sounds present. No organomegaly or mass.  EXTREMITIES: No cyanosis, clubbing or edema b/l.  RLE shortened and ext. Rotated due to fracture.   NEUROLOGIC: Cranial nerves II through XII are intact. No focal Motor or sensory deficits b/l.   PSYCHIATRIC: The patient is alert and oriented x 3.  SKIN: No obvious rash, lesion, or ulcer.    LABORATORY PANEL:   CBC  Recent Labs Lab 07/30/16 0122  WBC 7.3  HGB 13.5  HCT 39.6  PLT 271   ------------------------------------------------------------------------------------------------------------------  Chemistries   Recent Labs Lab 07/30/16 0122  NA 141  K 3.7  CL 107  CO2 28  GLUCOSE 101*  BUN 9  CREATININE 0.67  CALCIUM 9.0   ------------------------------------------------------------------------------------------------------------------  Cardiac Enzymes No results for input(s): TROPONINI in the last 168 hours. ------------------------------------------------------------------------------------------------------------------  RADIOLOGY:  Dg Chest 1 View  Result Date: 07/30/2016 CLINICAL DATA:  50 year old female with preop evaluation. EXAM: CHEST 1 VIEW COMPARISON:  Chest radiograph dated 02/21/2016 FINDINGS: The lungs are clear. There is no pleural effusion or pneumothorax. The cardiac silhouette is within normal limits. Loop recorder device over the left cardiac border again noted. No acute osseous pathology. IMPRESSION: No active disease. Electronically Signed   By: Anner Crete M.D.   On: 07/30/2016 03:06   Dg Hip Unilat With Pelvis 2-3 Views Right  Result Date: 07/30/2016 CLINICAL DATA:  [place] off of commode, RIGHT hip pain. History of breast cancer and stroke. EXAM: DG HIP (WITH OR WITHOUT PELVIS) 2-3V RIGHT COMPARISON:  None. FINDINGS: Acute impacted RIGHT femoral neck fracture with slight valgus angulation distal bony fragments. Femoral heads are located. Osteopenia without destructive bony lesions. Sacroiliac joints are symmetric. Phleboliths project in the pelvis. Soft tissue planes are nonsuspicious.  IMPRESSION: Acute mildly  displaced RIGHT femoral neck fracture without dislocation. Electronically Signed   By: Elon Alas M.D.   On: 07/30/2016 03:05     ASSESSMENT AND PLAN:   50 year old female with previous history of CVA, TIA, attention, GERD, depression, history of breast cancer who presents to the hospital after a mechanical fall and noted to have a right hip fracture.   1. Preoperative cardiovascular examination-patient is a low risk for noncardiac surgery. - INR still elevated and therefor surgery postponed to tomorrow. - will given vit K today and repeat INR in a.m.  -Continue to hold Coumadin for now.  2. Right hip fracture- appreciate Ortho input and plan for surgery tomorrow as INR elevated. Continue pain control with IV morphine.  3. History of previous breast cancer, continue Arimidex.  4. History of previous CVA-Coumadin on hold as patient to have surgery tomorrow. -Continue atorvastatin.  5. GERD-continue Protonix.  6. Depression/anxiety-continue Cymbalta, Klonopin.    All the records are reviewed and case discussed with Care Management/Social Worker. Management plans discussed with the patient, family and they are in agreement.  CODE STATUS: Full  DVT Prophylaxis: Coumadin  TOTAL TIME TAKING CARE OF THIS PATIENT: 30 minutes.   POSSIBLE D/C IN 2-3 DAYS, DEPENDING ON CLINICAL CONDITION.   Henreitta Leber M.D on 07/31/2016 at 3:52 PM  Between 7am to 6pm - Pager - [phone]  After 6pm go to [email or web address] - Proofreader  [facility]  Office  [phone]  CC: Primary care physician; Chriss Czar, MD

## 2016-07-31 NOTE — Progress Notes (Addendum)
  Dr. Verdell Carmine rounded on pt at 1315. Since that time, pt has been medicated for pain, ivf continues, pt is able to straighten her R leg a little more.pt received viatmin K to anterior L thigh.

## 2016-08-01 ENCOUNTER — Inpatient Hospital Stay: Payer: Managed Care, Other (non HMO) | Admitting: Certified Registered"

## 2016-08-01 ENCOUNTER — Encounter
Admission: EM | Disposition: A | Discharge status: Skilled Nursing Facility | Payer: Self-pay | Source: Home / Self Care | Attending: Specialist

## 2016-08-01 ENCOUNTER — Inpatient Hospital Stay: Payer: Managed Care, Other (non HMO)

## 2016-08-01 HISTORY — PX: HIP ARTHROPLASTY: SHX981

## 2016-08-01 LAB — PROTIME-INR
INR: 1.84
INR: 1.35
PROTHROMBIN TIME: 21.5 s — AB (ref 11.4–15.2)
Prothrombin Time: 16.8 seconds — ABNORMAL HIGH (ref 11.4–15.2)

## 2016-08-01 LAB — ABO/RH: ABO/RH(D): AB POS

## 2016-08-01 SURGERY — HEMIARTHROPLASTY, HIP, DIRECT ANTERIOR APPROACH, FOR FRACTURE
Anesthesia: General | Site: Hip | Laterality: Right | Wound class: Clean

## 2016-08-01 MED ORDER — LACTATED RINGERS IV SOLN
INTRAVENOUS | Status: DC | PRN
  Administered 2016-08-01 (×2): via INTRAVENOUS

## 2016-08-01 MED ORDER — SENNA 8.6 MG PO TABS: 1.0000 | ORAL_TABLET | Freq: Every day | ORAL | Status: DC

## 2016-08-01 MED ORDER — METOCLOPRAMIDE HCL 5 MG/ML IJ SOLN: 5.0000 mg | Freq: Three times a day (TID) | INTRAMUSCULAR | Status: DC | PRN

## 2016-08-01 MED ORDER — MIDAZOLAM HCL 2 MG/2ML IJ SOLN
INTRAMUSCULAR | Status: AC
  Filled 2016-08-01: qty 2

## 2016-08-01 MED ORDER — FENTANYL CITRATE (PF) 100 MCG/2ML IJ SOLN
25.0000 ug | INTRAMUSCULAR | Status: DC | PRN
  Administered 2016-08-01 (×3): 25 ug via INTRAVENOUS

## 2016-08-01 MED ORDER — ROCURONIUM BROMIDE 100 MG/10ML IV SOLN
INTRAVENOUS | Status: DC | PRN
  Administered 2016-08-01 (×2): 10 mg via INTRAVENOUS
  Administered 2016-08-01: 50 mg via INTRAVENOUS

## 2016-08-01 MED ORDER — FENTANYL CITRATE (PF) 100 MCG/2ML IJ SOLN
INTRAMUSCULAR | Status: DC | PRN
  Administered 2016-08-01: 25 ug via INTRAVENOUS
  Administered 2016-08-01: 50 ug via INTRAVENOUS
  Administered 2016-08-01: 100 ug via INTRAVENOUS
  Administered 2016-08-01 (×3): 50 ug via INTRAVENOUS
  Administered 2016-08-01: 25 ug via INTRAVENOUS

## 2016-08-01 MED ORDER — MENTHOL 3 MG MT LOZG
1.0000 | LOZENGE | OROMUCOSAL | Status: DC | PRN
  Filled 2016-08-01: qty 9

## 2016-08-01 MED ORDER — SODIUM CHLORIDE 0.9 % IV SOLN
Freq: Once | INTRAVENOUS | Status: AC
  Administered 2016-08-01: 17:00:00 via INTRAVENOUS

## 2016-08-01 MED ORDER — OXYCODONE HCL 5 MG PO TABS
5.0000 mg | ORAL_TABLET | ORAL | Status: DC | PRN
  Administered 2016-08-01 – 2016-08-02 (×2): 10 mg via ORAL
  Administered 2016-08-02: 5 mg via ORAL
  Administered 2016-08-02 – 2016-08-03 (×3): 10 mg via ORAL
  Administered 2016-08-03: 5 mg via ORAL
  Administered 2016-08-03 (×2): 10 mg via ORAL
  Filled 2016-08-01 (×2): qty 2
  Filled 2016-08-01 (×2): qty 1
  Filled 2016-08-01 (×4): qty 2

## 2016-08-01 MED ORDER — SODIUM CHLORIDE 0.9 % IV SOLN: INTRAVENOUS | Status: DC | PRN

## 2016-08-01 MED ORDER — ONDANSETRON HCL 4 MG/2ML IJ SOLN: 4.0000 mg | Freq: Once | INTRAMUSCULAR | Status: DC | PRN

## 2016-08-01 MED ORDER — FERROUS SULFATE 325 (65 FE) MG PO TABS
325.0000 mg | ORAL_TABLET | Freq: Two times a day (BID) | ORAL | Status: DC
  Administered 2016-08-02 – 2016-08-03 (×4): 325 mg via ORAL
  Filled 2016-08-01 (×4): qty 1

## 2016-08-01 MED ORDER — ACETAMINOPHEN 10 MG/ML IV SOLN
INTRAVENOUS | Status: AC
  Filled 2016-08-01: qty 100

## 2016-08-01 MED ORDER — FENTANYL CITRATE (PF) 250 MCG/5ML IJ SOLN
INTRAMUSCULAR | Status: AC
  Filled 2016-08-01: qty 5

## 2016-08-01 MED ORDER — DEXAMETHASONE SODIUM PHOSPHATE 10 MG/ML IJ SOLN
INTRAMUSCULAR | Status: DC | PRN
  Administered 2016-08-01: 10 mg via INTRAVENOUS

## 2016-08-01 MED ORDER — ENOXAPARIN SODIUM 40 MG/0.4ML ~~LOC~~ SOLN
40.0000 mg | SUBCUTANEOUS | Status: DC
  Administered 2016-08-02 – 2016-08-03 (×2): 40 mg via SUBCUTANEOUS
  Filled 2016-08-01 (×2): qty 0.4

## 2016-08-01 MED ORDER — WARFARIN - PHARMACIST DOSING INPATIENT
Freq: Every day | Status: DC
  Administered 2016-08-03: 1

## 2016-08-01 MED ORDER — PROPOFOL 10 MG/ML IV BOLUS
INTRAVENOUS | Status: AC
  Filled 2016-08-01: qty 20

## 2016-08-01 MED ORDER — MAGNESIUM HYDROXIDE 400 MG/5ML PO SUSP
30.0000 mL | Freq: Every day | ORAL | Status: DC | PRN
  Administered 2016-08-03: 30 mL via ORAL
  Filled 2016-08-01: qty 30

## 2016-08-01 MED ORDER — PHENYLEPHRINE HCL 10 MG/ML IJ SOLN
INTRAMUSCULAR | Status: DC | PRN
  Administered 2016-08-01 (×5): 80 ug via INTRAVENOUS

## 2016-08-01 MED ORDER — METOCLOPRAMIDE HCL 10 MG PO TABS
10.0000 mg | ORAL_TABLET | Freq: Three times a day (TID) | ORAL | Status: DC
  Administered 2016-08-02 – 2016-08-03 (×7): 10 mg via ORAL
  Filled 2016-08-01 (×7): qty 1

## 2016-08-01 MED ORDER — ACETAMINOPHEN 325 MG PO TABS: 650.0000 mg | ORAL_TABLET | Freq: Four times a day (QID) | ORAL | Status: DC | PRN

## 2016-08-01 MED ORDER — LIDOCAINE HCL (CARDIAC) 20 MG/ML IV SOLN
INTRAVENOUS | Status: DC | PRN
  Administered 2016-08-01: 60 mg via INTRAVENOUS

## 2016-08-01 MED ORDER — ACETAMINOPHEN 10 MG/ML IV SOLN
1000.0000 mg | Freq: Four times a day (QID) | INTRAVENOUS | Status: AC
  Administered 2016-08-01 – 2016-08-02 (×3): 1000 mg via INTRAVENOUS
  Filled 2016-08-01 (×4): qty 100

## 2016-08-01 MED ORDER — PHENOL 1.4 % MT LIQD
1.0000 | OROMUCOSAL | Status: DC | PRN
  Filled 2016-08-01: qty 177

## 2016-08-01 MED ORDER — SODIUM CHLORIDE 0.9 % IV SOLN
INTRAVENOUS | Status: DC
  Administered 2016-08-01: 23:00:00 via INTRAVENOUS

## 2016-08-01 MED ORDER — CEFAZOLIN SODIUM-DEXTROSE 2-4 GM/100ML-% IV SOLN
2.0000 g | Freq: Four times a day (QID) | INTRAVENOUS | Status: AC
  Administered 2016-08-02 (×4): 2 g via INTRAVENOUS
  Filled 2016-08-01 (×4): qty 100

## 2016-08-01 MED ORDER — FENTANYL CITRATE (PF) 100 MCG/2ML IJ SOLN
INTRAMUSCULAR | Status: AC
Start: 2016-08-01 — End: 2016-08-01
  Filled 2016-08-01: qty 2

## 2016-08-01 MED ORDER — ACETAMINOPHEN 650 MG RE SUPP: 650.0000 mg | Freq: Four times a day (QID) | RECTAL | Status: DC | PRN

## 2016-08-01 MED ORDER — SUGAMMADEX SODIUM 200 MG/2ML IV SOLN
INTRAVENOUS | Status: DC | PRN
  Administered 2016-08-01: 150 mg via INTRAVENOUS

## 2016-08-01 MED ORDER — ONDANSETRON HCL 4 MG/2ML IJ SOLN: 4.0000 mg | Freq: Four times a day (QID) | INTRAMUSCULAR | Status: DC | PRN

## 2016-08-01 MED ORDER — WARFARIN SODIUM 2.5 MG PO TABS: 5.0000 mg | ORAL_TABLET | Freq: Every day | ORAL | Status: DC

## 2016-08-01 MED ORDER — FENTANYL CITRATE (PF) 100 MCG/2ML IJ SOLN
INTRAMUSCULAR | Status: AC
  Filled 2016-08-01: qty 2

## 2016-08-01 MED ORDER — ROCURONIUM BROMIDE 10 MG/ML (PF) SYRINGE
PREFILLED_SYRINGE | INTRAVENOUS | Status: AC
  Filled 2016-08-01: qty 5

## 2016-08-01 MED ORDER — SENNOSIDES-DOCUSATE SODIUM 8.6-50 MG PO TABS
1.0000 | ORAL_TABLET | Freq: Two times a day (BID) | ORAL | Status: DC
  Administered 2016-08-02 – 2016-08-03 (×4): 1 via ORAL
  Filled 2016-08-01 (×4): qty 1

## 2016-08-01 MED ORDER — METOCLOPRAMIDE HCL 10 MG PO TABS: 5.0000 mg | ORAL_TABLET | Freq: Three times a day (TID) | ORAL | Status: DC | PRN

## 2016-08-01 MED ORDER — MORPHINE SULFATE (PF) 2 MG/ML IV SOLN
2.0000 mg | INTRAVENOUS | Status: DC | PRN
  Administered 2016-08-02 (×2): 2 mg via INTRAVENOUS
  Filled 2016-08-01 (×2): qty 1

## 2016-08-01 MED ORDER — PHENYLEPHRINE 40 MCG/ML (10ML) SYRINGE FOR IV PUSH (FOR BLOOD PRESSURE SUPPORT)
PREFILLED_SYRINGE | INTRAVENOUS | Status: AC
  Filled 2016-08-01: qty 10

## 2016-08-01 MED ORDER — LIDOCAINE 2% (20 MG/ML) 5 ML SYRINGE
INTRAMUSCULAR | Status: AC
  Filled 2016-08-01: qty 5

## 2016-08-01 MED ORDER — SEVOFLURANE IN SOLN
RESPIRATORY_TRACT | Status: AC
  Filled 2016-08-01: qty 250

## 2016-08-01 MED ORDER — PANTOPRAZOLE SODIUM 40 MG PO TBEC
40.0000 mg | DELAYED_RELEASE_TABLET | Freq: Two times a day (BID) | ORAL | Status: DC
  Administered 2016-08-02 – 2016-08-03 (×5): 40 mg via ORAL
  Filled 2016-08-01 (×5): qty 1

## 2016-08-01 MED ORDER — CEFAZOLIN SODIUM 1 G IJ SOLR
INTRAMUSCULAR | Status: DC | PRN
  Administered 2016-08-01: 2 g via INTRAMUSCULAR

## 2016-08-01 MED ORDER — FENTANYL CITRATE (PF) 100 MCG/2ML IJ SOLN
INTRAMUSCULAR | Status: DC | PRN
Start: 2016-08-01 — End: 2016-08-01

## 2016-08-01 MED ORDER — DEXAMETHASONE SODIUM PHOSPHATE 10 MG/ML IJ SOLN
INTRAMUSCULAR | Status: AC
  Filled 2016-08-01: qty 1

## 2016-08-01 MED ORDER — FLEET ENEMA 7-19 GM/118ML RE ENEM: 1.0000 | ENEMA | Freq: Once | RECTAL | Status: DC | PRN

## 2016-08-01 MED ORDER — MIDAZOLAM HCL 2 MG/2ML IJ SOLN
INTRAMUSCULAR | Status: DC | PRN
  Administered 2016-08-01: 2 mg via INTRAVENOUS

## 2016-08-01 MED ORDER — ONDANSETRON HCL 4 MG/2ML IJ SOLN
INTRAMUSCULAR | Status: DC | PRN
  Administered 2016-08-01: 4 mg via INTRAVENOUS

## 2016-08-01 MED ORDER — ACETAMINOPHEN 10 MG/ML IV SOLN
INTRAVENOUS | Status: DC | PRN
  Administered 2016-08-01: 1000 mg via INTRAVENOUS

## 2016-08-01 MED ORDER — PROPOFOL 10 MG/ML IV BOLUS
INTRAVENOUS | Status: DC | PRN
  Administered 2016-08-01: 100 mg via INTRAVENOUS

## 2016-08-01 MED ORDER — ONDANSETRON HCL 4 MG/2ML IJ SOLN
INTRAMUSCULAR | Status: AC
  Filled 2016-08-01: qty 2

## 2016-08-01 MED ORDER — TRAMADOL HCL 50 MG PO TABS
50.0000 mg | ORAL_TABLET | ORAL | Status: DC | PRN
  Administered 2016-08-02: 50 mg via ORAL
  Filled 2016-08-01: qty 1

## 2016-08-01 MED ORDER — BISACODYL 10 MG RE SUPP: 10.0000 mg | Freq: Every day | RECTAL | Status: DC | PRN

## 2016-08-01 MED ORDER — ONDANSETRON HCL 4 MG PO TABS: 4.0000 mg | ORAL_TABLET | Freq: Four times a day (QID) | ORAL | Status: DC | PRN

## 2016-08-01 MED ORDER — NEOMYCIN-POLYMYXIN B GU 40-200000 IR SOLN
Status: DC | PRN
  Administered 2016-08-01: 16 mL

## 2016-08-01 SURGICAL SUPPLY — 58 items
BAG DECANTER FOR FLEXI CONT (MISCELLANEOUS) ×3 IMPLANT
BLADE SAW 1 (BLADE) ×3 IMPLANT
CANISTER SUCT 1200ML W/VALVE (MISCELLANEOUS) ×3 IMPLANT
CANISTER SUCT 3000ML (MISCELLANEOUS) ×6 IMPLANT
CAPT HIP HEMI 2 ×3 IMPLANT
CATH FOL LEG HOLDER (MISCELLANEOUS) ×3 IMPLANT
DRAPE INCISE IOBAN 66X60 STRL (DRAPES) ×3 IMPLANT
DRAPE SHEET LG 3/4 BI-LAMINATE (DRAPES) ×3 IMPLANT
DRAPE TABLE BACK 80X90 (DRAPES) ×3 IMPLANT
DRSG DERMACEA 8X12 NADH (GAUZE/BANDAGES/DRESSINGS) ×3 IMPLANT
DRSG OPSITE POSTOP 4X12 (GAUZE/BANDAGES/DRESSINGS) ×3 IMPLANT
DRSG OPSITE POSTOP 4X14 (GAUZE/BANDAGES/DRESSINGS) IMPLANT
DRSG TEGADERM 4X4.75 (GAUZE/BANDAGES/DRESSINGS) ×3 IMPLANT
DURAPREP 26ML APPLICATOR (WOUND CARE) ×6 IMPLANT
ELECT BLADE 6.5 EXT (BLADE) ×3 IMPLANT
ELECT CAUTERY BLADE 6.4 (BLADE) ×6 IMPLANT
ELECT REM PT RETURN 9FT ADLT (ELECTROSURGICAL) ×3
ELECTRODE REM PT RTRN 9FT ADLT (ELECTROSURGICAL) ×1 IMPLANT
GAUZE PACK 2X3YD (MISCELLANEOUS) ×3 IMPLANT
GLOVE BIO SURGEON STRL SZ8 (GLOVE) ×6 IMPLANT
GLOVE BIOGEL M STRL SZ7.5 (GLOVE) ×21 IMPLANT
GLOVE BIOGEL PI IND STRL 9 (GLOVE) IMPLANT
GLOVE BIOGEL PI INDICATOR 9 (GLOVE)
GLOVE INDICATOR 8.0 STRL GRN (GLOVE) IMPLANT
GOWN STRL REUS W/ TWL LRG LVL3 (GOWN DISPOSABLE) ×2 IMPLANT
GOWN STRL REUS W/TWL 2XL LVL3 (GOWN DISPOSABLE) ×3 IMPLANT
GOWN STRL REUS W/TWL LRG LVL3 (GOWN DISPOSABLE) ×4
HANDPIECE INTERPULSE COAX TIP (DISPOSABLE)
HEMOVAC 400CC 10FR (MISCELLANEOUS) ×3 IMPLANT
HOOD PEEL AWAY FLYTE STAYCOOL (MISCELLANEOUS) ×3 IMPLANT
IV NS 100ML SINGLE PACK (IV SOLUTION) ×3 IMPLANT
KIT RM TURNOVER STRD PROC AR (KITS) ×3 IMPLANT
NDL SAFETY 18GX1.5 (NEEDLE) ×3 IMPLANT
NEEDLE FILTER BLUNT 18X 1/2SAF (NEEDLE) ×2
NEEDLE FILTER BLUNT 18X1 1/2 (NEEDLE) ×1 IMPLANT
NS IRRIG 1000ML POUR BTL (IV SOLUTION) ×3 IMPLANT
PACK HIP PROSTHESIS (MISCELLANEOUS) ×3 IMPLANT
PENCIL ELECTRO HAND CTR (MISCELLANEOUS) ×3 IMPLANT
PRESSURIZER CEMENT PROX FEM SM (MISCELLANEOUS) ×3 IMPLANT
PRESSURIZER FEM CANAL M (MISCELLANEOUS) ×3 IMPLANT
SET HNDPC FAN SPRY TIP SCT (DISPOSABLE) IMPLANT
SOL .9 NS 3000ML IRR  AL (IV SOLUTION) ×2
SOL .9 NS 3000ML IRR UROMATIC (IV SOLUTION) ×1 IMPLANT
SOL PREP PVP 2OZ (MISCELLANEOUS) ×3
SOLUTION PREP PVP 2OZ (MISCELLANEOUS) ×1 IMPLANT
SPONGE DRAIN TRACH 4X4 STRL 2S (GAUZE/BANDAGES/DRESSINGS) ×3 IMPLANT
STAPLER SKIN PROX 35W (STAPLE) ×3 IMPLANT
SUT ETHIBOND #5 BRAIDED 30INL (SUTURE) ×3 IMPLANT
SUT VIC AB 0 CT1 36 (SUTURE) ×3 IMPLANT
SUT VIC AB 1 CT1 36 (SUTURE) ×6 IMPLANT
SUT VIC AB 2-0 CT1 27 (SUTURE) ×2
SUT VIC AB 2-0 CT1 TAPERPNT 27 (SUTURE) ×1 IMPLANT
SYR 20CC LL (SYRINGE) ×3 IMPLANT
SYR TB 1ML LUER SLIP (SYRINGE) ×3 IMPLANT
TAPE TRANSPORE STRL 2 31045 (GAUZE/BANDAGES/DRESSINGS) ×3 IMPLANT
TIP COAXIAL FEMORAL CANAL (MISCELLANEOUS) ×3 IMPLANT
TOWEL OR 17X26 4PK STRL BLUE (TOWEL DISPOSABLE) ×3 IMPLANT
TOWER CARTRIDGE SMART MIX (DISPOSABLE) ×3 IMPLANT

## 2016-08-01 NOTE — Brief Op Note (Signed)
07/30/2016 - 08/01/2016  10:00 PM  PATIENT:  Tina Sharp  50 y.o. female  PRE-OPERATIVE DIAGNOSIS:  right femoral neck fracture  POST-OPERATIVE DIAGNOSIS:  right femoral neck fracture  PROCEDURE:  Procedure(s): ARTHROPLASTY BIPOLAR HIP (HEMIARTHROPLASTY) (Right)  SURGEON:  Surgeon(s) and Role:    * Dereck Leep, MD - Primary  ASSISTANTS: none   ANESTHESIA:   general  EBL:  Total I/O In: -  Out: 630 [Urine:230; Blood:400]  BLOOD ADMINISTERED:none  DRAINS: 2 medium hemovac   LOCAL MEDICATIONS USED:  NONE  SPECIMEN:  Source of Specimen:  Right femoral head  DISPOSITION OF SPECIMEN:  PATHOLOGY  COUNTS:  YES  TOURNIQUET:  * No tourniquets in log *  DICTATION: .Dragon Dictation  PLAN OF CARE: Admit to inpatient   PATIENT DISPOSITION:  PACU - hemodynamically stable.   Delay start of Pharmacological VTE agent (>24hrs) due to surgical blood loss or risk of bleeding: yes

## 2016-08-01 NOTE — Progress Notes (Signed)
[place] at [place] NAME: Tina Sharp    MR#:  [id number]  DATE OF BIRTH:  [birth year]  SUBJECTIVE:   Patient here with a mechanical fall and noted to have a right hip fracture. INR this a.m. 1.8 and will give FFP prior to pt. Possibly having surgery later today.   REVIEW OF SYSTEMS:    Review of Systems  Constitutional: Negative for chills and fever.  HENT: Negative for congestion and tinnitus.   Eyes: Negative for blurred vision and double vision.  Respiratory: Negative for cough, shortness of breath and wheezing.   Cardiovascular: Negative for chest pain, orthopnea and PND.  Gastrointestinal: Negative for abdominal pain, diarrhea, nausea and vomiting.  Genitourinary: Negative for dysuria and hematuria.  Musculoskeletal: Positive for falls and joint pain (Right Hip).  Neurological: Negative for dizziness, sensory change and focal weakness.  All other systems reviewed and are negative.   Nutrition: Regular Tolerating Diet: Yes Tolerating PT: Await Eval.     DRUG ALLERGIES:  No Known Allergies  VITALS:  Blood pressure 115/65, pulse 99, temperature 98.9 F (37.2 C), temperature source Oral, resp. rate 18, height 5\' 2"  (1.575 m), weight 65.6 kg (144 lb 9.6 oz), SpO2 99 %.  PHYSICAL EXAMINATION:   Physical Exam  GENERAL:  50 y.o.-year-old patient lying in bed in mild distress.  EYES: Pupils equal, round, reactive to light and accommodation. No scleral icterus. Extraocular muscles intact.  HEENT: Head atraumatic, normocephalic. Oropharynx and nasopharynx clear.  NECK:  Supple, no jugular venous distention. No thyroid enlargement, no tenderness.  LUNGS: Normal breath sounds bilaterally, no wheezing, rales, rhonchi. No use of accessory muscles of respiration.  CARDIOVASCULAR: S1, S2 normal. No murmurs, rubs, or gallops.  ABDOMEN: Soft, nontender, nondistended. Bowel sounds present. No organomegaly or mass.  EXTREMITIES: No  cyanosis, clubbing or edema b/l. RLE shortened and ext. Rotated due to fracture.   NEUROLOGIC: Cranial nerves II through XII are intact. No focal Motor or sensory deficits b/l.   PSYCHIATRIC: The patient is alert and oriented x 3.  SKIN: No obvious rash, lesion, or ulcer.    LABORATORY PANEL:   CBC  Recent Labs Lab 07/30/16 0122  WBC 7.3  HGB 13.5  HCT 39.6  PLT 271   ------------------------------------------------------------------------------------------------------------------  Chemistries   Recent Labs Lab 07/30/16 0122  NA 141  K 3.7  CL 107  CO2 28  GLUCOSE 101*  BUN 9  CREATININE 0.67  CALCIUM 9.0   ------------------------------------------------------------------------------------------------------------------  Cardiac Enzymes No results for input(s): TROPONINI in the last 168 hours. ------------------------------------------------------------------------------------------------------------------  RADIOLOGY:  No results found.   ASSESSMENT AND PLAN:   51 year old female with previous history of CVA, TIA, attention, GERD, depression, history of breast cancer who presents to the hospital after a mechanical fall and noted to have a right hip fracture.   1. Preoperative cardiovascular examination-patient is a low risk for noncardiac surgery. - INR still slightly elevated despite getting Vit K yesterday. Discussed w/ Ortho and will given 1 unit of FFP today.  -Continue to hold Coumadin for now.  2. Right hip fracture- appreciate Ortho input and plan for surgery later today after FFP given.  - cont. Pain control as per Ortho.   3. History of previous breast cancer - continue Arimidex.  4. History of previous CVA-Coumadin on hold as patient to have surgery today. Will resume Lovenox, Coumadin after surgery. -Continue atorvastatin.  5. GERD-continue Protonix.  6. Depression/anxiety-continue Cymbalta, Klonopin.    All  the records are reviewed and  case discussed with Care Management/Social Worker. Management plans discussed with the patient, family and they are in agreement.  CODE STATUS: Full  DVT Prophylaxis: Coumadin  TOTAL TIME TAKING CARE OF THIS PATIENT: 30 minutes.   POSSIBLE D/C IN 2-3 DAYS, DEPENDING ON CLINICAL CONDITION.   Henreitta Leber M.D on 08/01/2016 at 3:11 PM  Between 7am to 6pm - Pager - [phone]  After 6pm go to [email or web address] - Proofreader  [facility]  Office  [phone]  CC: Primary care physician; Chriss Czar, MD

## 2016-08-01 NOTE — Transfer of Care (Signed)
Immediate Anesthesia Transfer of Care Note  Patient: Tina Sharp  Procedure(s) Performed: Procedure(s): ARTHROPLASTY BIPOLAR HIP (HEMIARTHROPLASTY) (Right)  Patient Location: PACU  Anesthesia Type:General  Level of Consciousness: sedated  Airway & Oxygen Therapy: Patient Spontanous Breathing and Patient connected to face mask oxygen  Post-op Assessment: Report given to RN and Post -op Vital signs reviewed and stable  Post vital signs: Reviewed and stable  Last Vitals:  Vitals:   08/01/16 1749 08/01/16 2157  BP: 138/75 (!) 150/87  Pulse: 100 (!) 109  Resp: 18 11  Temp: 37 C 37 C    Last Pain:  Vitals:   08/01/16 1749  TempSrc: Oral  PainSc:       Patients Stated Pain Goal: 2 ([id number])  Complications: No apparent anesthesia complications

## 2016-08-01 NOTE — Progress Notes (Signed)
ANTICOAGULATION CONSULT NOTE - Initial Consult  Pharmacy Consult for VKA Indication: ppx p CVA  No Known Allergies  Patient Measurements: Height: 5\' 2"  (157.5 cm) Weight: 144 lb 9.6 oz (65.6 kg) IBW/kg (Calculated) : 50.1 Heparin Dosing Weight:   Vital Signs: Temp: 98.2 F (36.8 C) (12/20 2304) Temp Source: Oral (12/20 2304) BP: 138/75 (12/20 2304) Pulse Rate: 99 (12/20 2304)  Labs:  Recent Labs  07/30/16 0122  07/31/16 0650 08/01/16 0447 08/01/16 1411  HGB 13.5  --   --   --   --   HCT 39.6  --   --   --   --   PLT 271  --   --   --   --   LABPROT  --   < > 29.9* 21.5* 16.8*  INR  --   < > 2.78 1.84 1.35  CREATININE 0.67  --   --   --   --   < > = values in this interval not displayed.  Estimated Creatinine Clearance: 73.1 mL/min (by C-G formula based on SCr of 0.67 mg/dL).   Medical History: Past Medical History:  Diagnosis Date  . Anxiety   . Breast cancer [facility]) June 2015   Invasive lobular carcinoma, 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 ER/ PR+; Her 2 neu not overexpressed, microscopic positive margin (skin).  . Depression   . GERD (gastroesophageal reflux disease)   . Hypertension   . 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 sodium chloride 100 mL/hr at 08/01/16 2308    Assessment: 75 yof cc fall on VKA p CVA. Pharmacy consulted to resume VKA. Had procedure today, will resume dosing tomorrow.  Goal of Therapy:  INR 2-3 Monitor platelets by anticoagulation protocol: Yes   Plan:  Warfarin 5 mg po daily starting 12/21 at 1800. Will follow daily INR starting 12/22.  Laural Benes, Pharm.D., BCPS Clinical Pharmacist 08/01/2016,11:10 PM

## 2016-08-01 NOTE — Anesthesia Postprocedure Evaluation (Signed)
Anesthesia Post Note  Patient: Tina Sharp  Procedure(s) Performed: Procedure(s) (LRB): ARTHROPLASTY BIPOLAR HIP (HEMIARTHROPLASTY) (Right)  Patient location during evaluation: PACU Anesthesia Type: General Level of consciousness: awake and alert Pain management: pain level controlled Vital Signs Assessment: post-procedure vital signs reviewed and stable Respiratory status: spontaneous breathing and respiratory function stable Cardiovascular status: stable Anesthetic complications: no     Last Vitals:  Vitals:   08/01/16 2157 08/01/16 2212  BP: (!) 150/87 (!) 150/79  Pulse: (!) 109 97  Resp: 11 14  Temp: 37 C     Last Pain:  Vitals:   08/01/16 2157  TempSrc:   PainSc: Asleep                 Omarian Jaquith K

## 2016-08-01 NOTE — Anesthesia Procedure Notes (Signed)
Procedure Name: Intubation Date/Time: 08/01/2016 6:27 PM Performed by: Hedda Slade Pre-anesthesia Checklist: Patient identified, Emergency Drugs available, Suction available and Patient being monitored Patient Re-evaluated:Patient Re-evaluated prior to inductionOxygen Delivery Method: Circle system utilized Preoxygenation: Pre-oxygenation with 100% oxygen Intubation Type: IV induction Ventilation: Mask ventilation without difficulty Laryngoscope Size: Mac and 3 Grade View: Grade I Tube type: Oral Tube size: 7.0 mm Number of attempts: 1 Airway Equipment and Method: Stylet Placement Confirmation: ETT inserted through vocal cords under direct vision,  positive ETCO2 and breath sounds checked- equal and bilateral Secured at: 21 cm Tube secured with: Tape Dental Injury: Teeth and Oropharynx as per pre-operative assessment

## 2016-08-01 NOTE — Anesthesia Preprocedure Evaluation (Signed)
Anesthesia Evaluation  Patient identified by MRN, date of birth, ID band Patient awake    Reviewed: Allergy & Precautions, NPO status , Patient's Chart, lab work & pertinent test results  History of Anesthesia Complications Negative for: history of anesthetic complications  Airway Mallampati: II       Dental   Pulmonary former smoker,           Cardiovascular hypertension, Pt. on medications      Neuro/Psych Anxiety Depression CVA    GI/Hepatic Neg liver ROS, GERD  Medicated and Controlled,  Endo/Other  negative endocrine ROS  Renal/GU negative Renal ROS     Musculoskeletal   Abdominal   Peds  Hematology  (+) anemia ,   Anesthesia Other Findings   Reproductive/Obstetrics                             Anesthesia Physical Anesthesia Plan  ASA: III  Anesthesia Plan: General   Post-op Pain Management:    Induction:   Airway Management Planned: Oral ETT  Additional Equipment:   Intra-op Plan:   Post-operative Plan:   Informed Consent: I have reviewed the patients History and Physical, chart, labs and discussed the procedure including the risks, benefits and alternatives for the proposed anesthesia with the patient or authorized representative who has indicated his/her understanding and acceptance.     Plan Discussed with:   Anesthesia Plan Comments:         Anesthesia Quick Evaluation

## 2016-08-01 NOTE — Progress Notes (Signed)
  Subjective:    Patient reports pain as moderate.   Patient seen in rounds with Dr. Marry Guan. Patient is recently admitted for Right hip fracture Plan is to go Home versus rehabilitation after surgery. Negative for chest pain and shortness of breath Fever: No Gastrointestinal: Negative for nausea and vomiting  Objective: Vital signs in last 24 hours: Temp:  [98.1 F (36.7 C)-99.7 F (37.6 C)] 98.9 F (37.2 C) (12/20 0500) Pulse Rate:  [95-99] 99 (12/20 0500) Resp:  [18] 18 (12/20 0500) BP: (96-115)/(48-65) 115/65 (12/20 0500) SpO2:  [86 %-99 %] 99 % (12/20 0500) Weight:  [65.6 kg (144 lb 9.6 oz)] 65.6 kg (144 lb 9.6 oz) (12/20 0500)  Intake/Output from previous day:  Intake/Output Summary (Last 24 hours) at 08/01/16 0709 Last data filed at 08/01/16 0553  Gross per 24 hour  Intake              240 ml  Output             1750 ml  Net            -1510 ml    Intake/Output this shift: No intake/output data recorded.  Labs:  Recent Labs  07/30/16 0122  HGB 13.5    Recent Labs  07/30/16 0122  WBC 7.3  RBC 4.65  HCT 39.6  PLT 271    Recent Labs  07/30/16 0122  NA 141  K 3.7  CL 107  CO2 28  BUN 9  CREATININE 0.67  GLUCOSE 101*  CALCIUM 9.0    Recent Labs  07/31/16 0650 08/01/16 0447  INR 2.78 1.84     EXAM General - Patient is Alert and Oriented Extremity - Neurovascular intact Sensation intact distally  Right hip held in mild flexion for comfort. No Buck's traction. Dressing/Incision - no surgical incision Motor Function - intact, moving foot and toes well on exam.   Past Medical History:  Diagnosis Date  . Anxiety   . Breast cancer [facility]) June 2015   Invasive lobular carcinoma, 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 ER/ PR+; Her 2 neu not overexpressed, microscopic positive margin (skin).  . Depression   . GERD (gastroesophageal reflux disease)   . Hypertension   . 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   Weight as of this encounter: 65.6 kg (144 lb 9.6 oz). INR level 1.84. Will await surgery tomorrow if INR is below 1.4. Probable surgery tomorrow.  DVT Prophylaxis - Foot Pumps No ambulation  Reche Dixon, PA-C Orthopaedic Surgery 08/01/2016, 7:09 AM

## 2016-08-01 NOTE — Progress Notes (Signed)
INR results back,, MD Hooten paged, MD Hootens nurse returned paged and was notified. Per nurse MD Hooten has a few more pts to see, and to wait on OR to call me, nurse was notified that Butch Penny in the [place] has contacted me already. This Probation officer notified Butch Penny in [place], that MD Hooten has 2 more pts to see and for her to let me know when they were ready for pt so nurse can transfuse plasma.

## 2016-08-01 NOTE — Op Note (Addendum)
OPERATIVE NOTE  DATE OF SURGERY:  10/03/2015  PATIENT NAME:  Tina Sharp   DOB: 02/29/1966  MRN: [id number]  PRE-OPERATIVE DIAGNOSIS: Right femoral neck fracture  POST-OPERATIVE DIAGNOSIS:  Same  PROCEDURE:  Right hip bipolar hemiarthroplasty  SURGEON:  Marciano Sequin. M.D.  ANESTHESIA: general  ESTIMATED BLOOD LOSS: 400 mL  FLUIDS REPLACED: 1150 mL of crystalloid  DRAINS: 2 medium drains to a Hemovac reservoir  IMPLANTS UTILIZED: DePuy size 3 Summit femoral stem with Duofix HA, 40 mm OD self-centering bipolar head, 28 mm cobalt chrome +5 mm hip ball  INDICATIONS FOR SURGERY: [name] is a 50 y.o. year old female who fell and sustained a displaced right femoral neck fracture. After discussion of the risks and benefits of surgical intervention, the patient expressed understanding of the risks benefits and agree with plans for hip hemiarthroplasty.   The risks, benefits, and alternatives were discussed at length including but not limited to the risks of infection, bleeding, nerve injury, stiffness, blood clots, the need for revision surgery, limb length inequality, dislocation, cardiopulmonary complications, among others, and they were willing to proceed.  PROCEDURE IN DETAIL: The patient was brought into the operating room and, after adequate general anesthesia was achieved, patient was placed in a left lateral decubitus position. Axillary roll was placed and all bony prominences were well-padded. The patient's right hip was cleaned and prepped with alcohol and DuraPrep and draped in the usual sterile fashion. A "timeout" was performed as per usual protocol. A lateral curvilinear incision was made gently curving towards the posterior superior iliac spine. The IT band was incised in line with the skin incision and the fibers of the gluteus maximus were split in line. The piriformis tendon was identified, skeletonized, and incised at its insertion to the proximal femur and  reflected posteriorly. A T type posterior capsulotomy was performed. The femoral head was then removed and measured using calipers and ring gauges and determined to be 40 mm in diameter.due to the size of the femoral head, it was elected to proceed with preparation for a bipolar hemiarthroplasty. The acetabulum was inspected for any bony fragments. The articular surface was in excellent condition.  Attention was then directed to the proximal femur. A pilot hole for preparation of the proximal femoral canal was created using a high-speed bur. The femoral canal finder was inserted followed by insertion of the conical reamer. Serial reamers were inserted up to a 2/3 size. Serial broaches were inserted up to a size 3 broach. Calcar region was planed and a trial reduction was performed using a 40 mm OD bipolar head with a 28 mm hip ball with a +5 mm neck length. Good equalization of limb lengths was appreciated and excellent stability was noted both anteriorly and posteriorly. Trial components were removed. The size 3 Summit femoral component with Duofix HA was positioned and impacted into place. A 40 mm outer diameter self-centering bipolar head with a 28 mm hip ball with a +5 mm neck length was placed on the trunnion and impacted into place. The acetabulum was again irrigated and suctioned dry, making sure to inspect for any residal bony debris. The femoral head was then reduced and placed through a range of motion. Excellent stability was noted both anteriorly and posteriorly. Good equalization of limb lengths was appreciated.   The wound was irrigated with copious amounts of normal saline with antibiotic solution and suctioned dry. Good hemostasis was appreciated. The posterior capsulotomy was repaired using #5 Ethibond.  Piriformis tendon was reapproximated to the undersurface of the gluteus medius tendon using #5 Ethibond. Two medium drains were placed in the wound bed and brought out through separate stab  incisions to be attached to a Hemovac reservoir. The IT band was reapproximated using interrupted sutures of #1 Vicryl. Subcutaneous tissue was proximal phalanx using first #0 Vicryl followed by #2-0 Vicryl. The skin was closed with skin staples.  The patient tolerated the procedure well and was transported to the recovery room in stable condition.   Marciano Sequin., M.D.

## 2016-08-02 LAB — PROTIME-INR
INR: 1.18
Prothrombin Time: 15.1 seconds (ref 11.4–15.2)

## 2016-08-02 LAB — BASIC METABOLIC PANEL
ANION GAP: 5 (ref 5–15)
BUN: 6 mg/dL (ref 6–20)
CALCIUM: 7.9 mg/dL — AB (ref 8.9–10.3)
CO2: 28 mmol/L (ref 22–32)
Chloride: 106 mmol/L (ref 101–111)
Creatinine, Ser: 0.49 mg/dL (ref 0.44–1.00)
GFR calc non Af Amer: >60 mL/min (ref >60)
Glucose, Bld: 183 mg/dL — ABNORMAL HIGH (ref 65–99)
POTASSIUM: 3.6 mmol/L (ref 3.5–5.1)
Sodium: 139 mmol/L (ref 135–145)

## 2016-08-02 LAB — PREPARE FRESH FROZEN PLASMA
Blood Product Expiration Date: 201712252359
ISSUE DATE / TIME: 201712201722
Unit Type and Rh: 2800

## 2016-08-02 LAB — CBC
HEMATOCRIT: 30.5 % — AB (ref 35.0–47.0)
HEMOGLOBIN: 10.4 g/dL — AB (ref 12.0–16.0)
MCH: 29.3 pg (ref 26.0–34.0)
MCHC: 34.1 g/dL (ref 32.0–36.0)
MCV: 85.8 fL (ref 80.0–100.0)
Platelets: 192 10*3/uL (ref 150–440)
RBC: 3.55 MIL/uL — AB (ref 3.80–5.20)
RDW: 14.9 % — AB (ref 11.5–14.5)
WBC: 7.3 10*3/uL (ref 3.6–11.0)

## 2016-08-02 MED ORDER — ENOXAPARIN SODIUM 40 MG/0.4ML ~~LOC~~ SOLN: 40.0000 mg | SUBCUTANEOUS | 0 refills | Status: DC

## 2016-08-02 MED ORDER — TRAMADOL HCL 50 MG PO TABS: 50.0000 mg | ORAL_TABLET | ORAL | 1 refills | Status: DC | PRN

## 2016-08-02 MED ORDER — WARFARIN SODIUM 2.5 MG PO TABS
5.0000 mg | ORAL_TABLET | Freq: Every day | ORAL | Status: DC
  Administered 2016-08-02 – 2016-08-03 (×2): 5 mg via ORAL
  Filled 2016-08-02 (×3): qty 2

## 2016-08-02 MED ORDER — OXYCODONE HCL 5 MG PO TABS: 5.0000 mg | ORAL_TABLET | ORAL | 0 refills | Status: DC | PRN

## 2016-08-02 NOTE — Discharge Instructions (Signed)
POSTERIOR TOTAL HIP REPLACEMENT POSTOPERATIVE DIRECTIONS ° °Hip Rehabilitation, Guidelines Following Surgery  °The results of a hip operation are greatly improved after range of motion and muscle strengthening exercises. Follow all safety measures which are given to protect your hip. If any of these exercises cause increased pain or swelling in your joint, decrease the amount until you are comfortable again. Then slowly increase the exercises. Call your caregiver if you have problems or questions.  ° °HOME CARE INSTRUCTIONS  °Remove items at home which could result in a fall. This includes throw rugs or furniture in walking pathways.  °· ICE to the affected hip every three hours for 30 minutes at a time and then as needed for pain and swelling.  Continue to use ice on the hip for pain and swelling from surgery. You may notice swelling that will progress down to the foot and ankle.  This is normal after surgery.  Elevate the leg when you are not up walking on it.   °· Continue to use the breathing machine which will help keep your temperature down.  It is common for your temperature to cycle up and down following surgery, especially at night when you are not up moving around and exerting yourself.  The breathing machine keeps your lungs expanded and your temperature down. ° °DIET °You may resume your previous home diet once your are discharged from the hospital. ° °DRESSING / WOUND CARE / SHOWERING °Keep the surgical dressing until follow up.  The dressing is water proof, so you can shower without any extra covering.  IF THE DRESSING FALLS OFF or the wound gets wet inside, change the dressing with sterile gauze.  Please use good hand washing techniques before changing the dressing.  Do not use any lotions or creams on the incision until instructed by your surgeon.   °You need to keep your dressing dry after discharge.   °Change the surgical dressing if needed with Physical Therapy and reapply a dry dressing each  time. ° ° ° °ACTIVITY °Walk with your walker as instructed. °Use walker as long as suggested by your caregivers. °Avoid periods of inactivity such as sitting longer than an hour when not asleep. This helps prevent blood clots.  °You may resume a sexual relationship in one month or when given the OK by your doctor.  °You may return to work once you are cleared by your doctor.  °Do not drive a car for 6 weeks or until released by you surgeon.  °Do not drive while taking narcotics. ° °WEIGHT BEARING °Weight bearing as tolerated with assist device (walker, cane, etc) as directed, use it as long as suggested by your surgeon or therapist, typically at least 4-6 weeks. ° °POSTOPERATIVE CONSTIPATION PROTOCOL °Constipation - defined medically as fewer than three stools per week and severe constipation as less than one stool per week. ° °One of the most common issues patients have following surgery is constipation.  Even if you have a regular bowel pattern at home, your normal regimen is likely to be disrupted due to multiple reasons following surgery.  Combination of anesthesia, postoperative narcotics, change in appetite and fluid intake all can affect your bowels.  In order to avoid complications following surgery, here are some recommendations in order to help you during your recovery period. ° °Colace (docusate) - Pick up an over-the-counter form of Colace or another stool softener and take twice a day as long as you are requiring postoperative pain medications.  Take with a   full glass of water daily.  If you experience loose stools or diarrhea, hold the colace until you stool forms back up.  If your symptoms do not get better within 1 week or if they get worse, check with your doctor. ° °Dulcolax (bisacodyl) - Pick up over-the-counter and take as directed by the product packaging as needed to assist with the movement of your bowels.  Take with a full glass of water.  Use this product as needed if not relieved by Colace  only.  ° °MiraLax (polyethylene glycol) - Pick up over-the-counter to have on hand.  MiraLax is a solution that will increase the amount of water in your bowels to assist with bowel movements.  Take as directed and can mix with a glass of water, juice, soda, coffee, or tea.  Take if you go more than two days without a movement. °Do not use MiraLax more than once per day. Call your doctor if you are still constipated or irregular after using this medication for 7 days in a row. ° °If you continue to have problems with postoperative constipation, please contact the office for further assistance and recommendations.  If you experience "the worst abdominal pain ever" or develop nausea or vomiting, please contact the office immediatly for further recommendations for treatment. ° °ITCHING ° If you experience itching with your medications, try taking only a single pain pill, or even half a pain pill at a time.  You can also use Benadryl over the counter for itching or also to help with sleep.  ° °TED HOSE STOCKINGS °Wear the elastic stockings on both legs for three weeks following surgery during the day but you may remove then at night for sleeping. ° °MEDICATIONS °See your medication summary on the “After Visit Summary” that the nursing staff will review with you prior to discharge.  You may have some home medications which will be placed on hold until you complete the course of blood thinner medication.  It is important for you to complete the blood thinner medication as prescribed by your surgeon.  Continue your approved medications as instructed at time of discharge. ° °PRECAUTIONS °If you experience chest pain or shortness of breath - call 911 immediately for transfer to the hospital emergency department.  °If you develop a fever greater that 101 F, purulent drainage from wound, increased redness or drainage from wound, foul odor from the wound/dressing, or calf pain - CONTACT YOUR SURGEON.   °                                                 °FOLLOW-UP APPOINTMENTS °Make sure you keep all of your appointments after your operation with your surgeon and caregivers. You should call the office at the above phone number and make an appointment for approximately two weeks after the date of your surgery or on the date instructed by your surgeon outlined in the "After Visit Summary". ° °RANGE OF MOTION AND STRENGTHENING EXERCISES  °These exercises are designed to help you keep full movement of your hip joint. Follow your caregiver's or physical therapist's instructions. Perform all exercises about fifteen times, three times per day or as directed. Exercise both hips, even if you have had only one joint replacement. These exercises can be done on a training (exercise) mat, on the floor, on a table or on a   bed. Use whatever works the best and is most comfortable for you. Use music or television while you are exercising so that the exercises are a pleasant break in your day. This will make your life better with the exercises acting as a break in routine you can look forward to.  °Lying on your back, slowly slide your foot toward your buttocks, raising your knee up off the floor. Then slowly slide your foot back down until your leg is straight again.  °Lying on your back spread your legs as far apart as you can without causing discomfort.  °Lying on your side, raise your upper leg and foot straight up from the floor as far as is comfortable. Slowly lower the leg and repeat.  °Lying on your back, tighten up the muscle in the front of your thigh (quadriceps muscles). You can do this by keeping your leg straight and trying to raise your heel off the floor. This helps strengthen the largest muscle supporting your knee.  °Lying on your back, tighten up the muscles of your buttocks both with the legs straight and with the knee bent at a comfortable angle while keeping your heel on the floor.  ° ° ° ° °IF YOU ARE TRANSFERRED TO A SKILLED REHAB  FACILITY °If the patient is transferred to a skilled rehab facility following release from the hospital, a list of the current medications will be sent to the facility for the patient to continue.  When discharged from the skilled rehab facility, please have the facility set up the patient's Home Health Physical Therapy prior to being released. Also, the skilled facility will be responsible for providing the patient with their medications at time of release from the facility to include their pain medication, the muscle relaxants, and their blood thinner medication. If the patient is still at the rehab facility at time of the two week follow up appointment, the skilled rehab facility will also need to assist the patient in arranging follow up appointment in our office and any transportation needs. ° °MAKE SURE YOU:  °Understand these instructions.  °Get help right away if you are not doing well or get worse.  ° ° °Pick up stool softner and laxative for home use following surgery while on pain medications. °Do not submerge incision under water. °Please use good hand washing techniques while changing dressing each day. °May shower starting three days after surgery. °Please use a clean towel to pat the incision dry following showers. °Continue to use ice for pain and swelling after surgery. °Do not use any lotions or creams on the incision until instructed by your surgeon. °

## 2016-08-02 NOTE — Clinical Social Work Placement (Signed)
   CLINICAL SOCIAL WORK PLACEMENT  NOTE  Date:  08/02/2016  Patient Details  Name: ANVI DAIGLER MRN: [id number] Date of Birth: 08-15-1965  Clinical Social Work is seeking post-discharge placement for this patient at the [place] level of care (*CSW will initial, date and re-position this form in  chart as items are completed):  Yes   Patient/family provided with [facility] Work Department's list of facilities offering this level of care within the geographic area requested by the patient (or if unable, by the patient's family).  Yes   Patient/family informed of their freedom to choose among providers that offer the needed level of care, that participate in Medicare, Medicaid or managed care program needed by the patient, have an available bed and are willing to accept the patient.  Yes   Patient/family informed of [facility]'s ownership interest in [facility] and [facility], as well as of the fact that they are under no obligation to receive care at these facilities.  PASRR submitted to EDS on       PASRR number received on       Existing PASRR number confirmed on 08/02/16     FL2 transmitted to all facilities in geographic area requested by pt/family on 08/02/16     FL2 transmitted to all facilities within larger geographic area on       Patient informed that his/her managed care company has contracts with or will negotiate with certain facilities, including the following:        Yes   Patient/family informed of bed offers received.  Patient chooses bed at  ([facility] )     Physician recommends and patient chooses bed at      Patient to be transferred to   on  .  Patient to be transferred to facility by       Patient family notified on   of transfer.  Name of family member notified:        PHYSICIAN       Additional Comment:    _______________________________________________ Haroun Cotham, Veronia Beets, LCSW 08/02/2016, 10:47 AM

## 2016-08-02 NOTE — Progress Notes (Signed)
[facility] [place] NAME: Tina Sharp    MR#:  [id number]  DATE OF BIRTH:  1966/07/14  SUBJECTIVE:   Patient here with a mechanical fall and noted to have a right hip fracture. S/p ORIF POD # 1.  Still having some right hip pain. Foley removed. Worked with PT this a.m.   REVIEW OF SYSTEMS:    Review of Systems  Constitutional: Negative for chills and fever.  HENT: Negative for congestion and tinnitus.   Eyes: Negative for blurred vision and double vision.  Respiratory: Negative for cough, shortness of breath and wheezing.   Cardiovascular: Negative for chest pain, orthopnea and PND.  Gastrointestinal: Negative for abdominal pain, diarrhea, nausea and vomiting.  Genitourinary: Negative for dysuria and hematuria.  Musculoskeletal: Positive for falls and joint pain (Right Hip).  Neurological: Negative for dizziness, sensory change and focal weakness.  All other systems reviewed and are negative.   Nutrition: Regular Tolerating Diet: Yes Tolerating PT: Await Eval.     DRUG ALLERGIES:  No Known Allergies  VITALS:  Blood pressure (!) 104/53, pulse 91, temperature 98.1 F (36.7 C), temperature source Oral, resp. rate 18, height 5\' 2"  (1.575 m), weight 65.6 kg (144 lb 9.6 oz), SpO2 100 %.  PHYSICAL EXAMINATION:   Physical Exam  GENERAL:  50 y.o.-year-old patient lying in bed in mild distress.  EYES: Pupils equal, round, reactive to light and accommodation. No scleral icterus. Extraocular muscles intact.  HEENT: Head atraumatic, normocephalic. Oropharynx and nasopharynx clear.  NECK:  Supple, no jugular venous distention. No thyroid enlargement, no tenderness.  LUNGS: Normal breath sounds bilaterally, no wheezing, rales, rhonchi. No use of accessory muscles of respiration.  CARDIOVASCULAR: S1, S2 normal. No murmurs, rubs, or gallops.  ABDOMEN: Soft, nontender, nondistended. Bowel sounds present. No organomegaly or mass.  EXTREMITIES:  No cyanosis, clubbing or edema b/l. Right Hip dressing in place from surgery yesterday.  NEUROLOGIC: Cranial nerves II through XII are intact. No focal Motor or sensory deficits b/l.   PSYCHIATRIC: The patient is alert and oriented x 3.  SKIN: No obvious rash, lesion, or ulcer.    LABORATORY PANEL:   CBC  Recent Labs Lab 08/02/16 0403  WBC 7.3  HGB 10.4*  HCT 30.5*  PLT 192   ------------------------------------------------------------------------------------------------------------------  Chemistries   Recent Labs Lab 08/02/16 0403  NA 139  K 3.6  CL 106  CO2 28  GLUCOSE 183*  BUN 6  CREATININE 0.49  CALCIUM 7.9*   ------------------------------------------------------------------------------------------------------------------  Cardiac Enzymes No results for input(s): TROPONINI in the last 168 hours. ------------------------------------------------------------------------------------------------------------------  RADIOLOGY:  Dg Hip Port Unilat With Pelvis 1v Right  Result Date: 08/01/2016 CLINICAL DATA:  50 year old female status post right hip hemiarthroplasty. EXAM: DG HIP (WITH OR WITHOUT PELVIS) 1V PORT RIGHT COMPARISON:  Radiographs dated 07/30/2016 FINDINGS: There has been interval right hip hemiarthroplasty. The orthopedic hardware appears intact and in anatomic alignment. The bones are osteopenic. No acute fracture identified. Small pockets of soft tissue air and multiple cutaneous surgical clips noted over the right hip. A drainage tube is seen IMPRESSION: Right hip hemiarthroplasty appears intact and in anatomic alignment. Osteopenia.  No acute fracture. Electronically Signed   By: Anner Crete M.D.   On: 08/01/2016 23:00     ASSESSMENT AND PLAN:   50 year old female with previous history of CVA, TIA, attention, GERD, depression, history of breast cancer who presents to the hospital after a mechanical fall and noted to have a right  hip  fracture.  1. Right hip fracture- s/p ORIF POD # 1.  - Cont. Further care as per Ortho.  Seen by PT and likely will need SNF/STR and pt. Is in agreement.  - cont. Pain control as per Ortho.  Foley removed today.   2. History of previous breast cancer - continue Arimidex.  3. History of previous CVA-Coumadin on hold as patient to have surgery today. - cont. Lovenox, Coumadin now.  - Continue atorvastatin.  4. GERD-continue Protonix.  5. Depression/anxiety-continue Cymbalta, Klonopin.  Social work working on placement.   All the records are reviewed and case discussed with Care Management/Social Worker. Management plans discussed with the patient, family and they are in agreement.  CODE STATUS: Full  DVT Prophylaxis: Coumadin  TOTAL TIME TAKING CARE OF THIS PATIENT: 30 minutes.   POSSIBLE D/C IN 1-2 DAYS, DEPENDING ON CLINICAL CONDITION.   Henreitta Leber M.D on 08/02/2016 at 3:14 PM  Between 7am to 6pm - Pager - [phone]  After 6pm go to [email or web address] - Proofreader  [facility]  Office  [phone]  CC: Primary care physician; Chriss Czar, MD

## 2016-08-02 NOTE — Progress Notes (Signed)
Physical Therapy Treatment Patient Details Name: Tina Sharp MRN: [id number] DOB: 02/07/1966 Today's Date: 08/02/2016    History of Present Illness Pt. is a 50 y.o. female [facility]. Pt. has a a recent history of a CVA.    PT Comments    Pt lethargic, but agreeable to PT. Pt reports 8/10 pain in R hip/thigh and knee. Pt participates with Bilateral lower extremities supine bed exercises requiring assist bilaterally, as left sided weakness present from previous CVA. Pt expresses frustration. Pt does require increased assist on left and has pain with movement during exercises. Pt refuses out of bed at this time due to lethargy and pain. Continue PT to progress strength, endurance to improve all functional mobility.   Follow Up Recommendations  SNF     Equipment Recommendations       Recommendations for Other Services       Precautions / Restrictions Precautions Precautions: Fall;Posterior Hip (remembers 2/3 precautions) Restrictions Weight Bearing Restrictions: Yes RLE Weight Bearing: Weight bearing as tolerated    Mobility  Bed Mobility                  Transfers                    Ambulation/Gait                 Stairs            Wheelchair Mobility    Modified Rankin (Stroke Patients Only)       Balance                                    Cognition Arousal/Alertness: Awake/alert Behavior During Therapy: WFL for tasks assessed/performed Overall Cognitive Status: Within Functional Limits for tasks assessed                      Exercises Total Joint Exercises Ankle Circles/Pumps: AROM;Both;20 reps;Supine Quad Sets: Strengthening;Both;20 reps;Supine Gluteal Sets: Strengthening;Both;20 reps;Supine Towel Squeeze: Strengthening;Both;20 reps;Supine Short Arc Quad: AAROM;Right;20 reps;Supine (AROM L) Heel Slides: AAROM;Right;20 reps;Supine Hip ABduction/ADduction:  AAROM;20 reps;Supine;Both    General Comments        Pertinent Vitals/Pain Pain Assessment: 0-10 Pain Score: 8  Pain Location: R hip/thigh/knee Pain Descriptors / Indicators: Aching Pain Intervention(s): Limited activity within patient's tolerance;Monitored during session;Premedicated before session    Home Living Family/patient expects to be discharged to:: Private residence Living Arrangements: Spouse/significant other (Spouse, and daughter. Mother assist pt.) Available Help at Discharge: Available 24 hours/day Type of Home: House Home Access: Ramped entrance   Home Layout: One level Home Equipment: [place] - 2 wheels;Hospital bed;Wheelchair - manual;Shower seat (Uses adaptive utensils.)      Prior Function        Comments: Pt. was receiving HHOT, and PT prior to admission.    PT Goals (current goals can now be found in the care plan section) Acute Rehab PT Goals Patient Stated Goal: To go home Progress towards PT goals: Progressing toward goals (slowly)    Frequency    BID      PT Plan Current plan remains appropriate    Co-evaluation             End of Session   Activity Tolerance: Patient limited by lethargy;Patient limited by fatigue;Patient limited by pain Patient left: in bed;with call bell/phone within reach;with bed alarm  set     Time: [id number] PT Time Calculation (min) (ACUTE ONLY): 24 min  Charges:  $Therapeutic Exercise: 23-37 mins                    G Codes:      Larae Grooms, PTA 08/02/2016, 3:43 PM

## 2016-08-02 NOTE — Evaluation (Addendum)
Occupational Therapy Evaluation Patient Details Name: Tina Sharp MRN: [id number] DOB: [birth year] Today's Date: 08/02/2016    History of Present Illness Pt. is a 50 y.o. female who [facility]. Pt. has a a recent history of a CVA.   Clinical Impression   Pt. Is a 50 y.o female who was admitted to [facility] for a right Hip Hemiarthroplasty. Pt. Presents with pain, posterior hip precautions, weakness, impaired Bilateral UE functioning, decreased activity tolerance, and impaired functional mobility which hinder her ability to complete ADL tasks. Pt. Could benefit from skilled OT services for ADL training, A/E training, UE there. Ex, there. Activity, work simplification techniques, neuromuscular re-ed, and pt. education about home modification, and DME.    Follow Up Recommendations  SNF    Equipment Recommendations       Recommendations for Other Services       Precautions / Restrictions Precautions Precautions: Fall;Posterior Hip Restrictions Weight Bearing Restrictions: Yes RLE Weight Bearing: Weight bearing as tolerated         Balance Overall balance assessment: Needs assistance Sitting-balance support: Feet supported;No upper extremity supported Sitting balance-Leahy Scale: Good                                ADL Overall ADL's : Needs assistance/impaired Eating/Feeding: Set up;Minimal assistance (uses adaptive utensils at home)   Grooming: Set up;Minimal assistance               Lower Body Dressing: Maximal assistance                 General ADL Comments: Pt. education was provided about ADL use for LE ADLs, and posterior hip precautions.     Vision     Perception     Praxis      Pertinent Vitals/Pain Pain Assessment: 0-10 Pain Score: 9  Pain Descriptors / Indicators: Aching Pain Intervention(s): Limited activity within patient's tolerance;Monitored during session;Premedicated before  session;Repositioned     Hand Dominance Right   Extremity/Trunk Assessment Upper Extremity Assessment Upper Extremity Assessment: RUE deficits/detail;LUE deficits/detail (Pt. presents with bilateral UE impairments from previous CVAs.) RUE Sensation: decreased light touch RUE Coordination: decreased fine motor LUE Sensation: decreased light touch LUE Coordination: decreased fine motor       Communication Communication Communication: No difficulties   Cognition Arousal/Alertness: Awake/alert Behavior During Therapy: WFL for tasks assessed/performed Overall Cognitive Status: Within Functional Limits for tasks assessed                     General Comments       Exercises   Shoulder Instructions      Home Living Family/patient expects to be discharged to:: Private residence Living Arrangements: Spouse/significant other (Spouse, and daughter. Mother assist pt.) Available Help at Discharge: Available 24 hours/day Type of Home: House Home Access: Ramped entrance     Home Layout: One level     Bathroom Shower/Tub: Occupational psychologist: Standard Bathroom Accessibility: No   Home Equipment: Environmental consultant - 2 wheels;Hospital bed;Wheelchair - manual;Shower seat (Uses adaptive utensils.)          Prior Functioning/Environment Level of Independence: Independent with assistive device(s)        Comments: Pt. was receiving HHOT, and PT prior to admission.         OT Problem List: Decreased strength;Decreased knowledge of use of DME or AE;Pain;Decreased activity tolerance;Decreased range  of motion;Impaired sensation;Decreased safety awareness;Decreased coordination;Impaired UE functional use;Decreased knowledge of precautions   OT Treatment/Interventions: Self-care/ADL training;Therapeutic exercise;Therapeutic activities;Energy conservation;Visual/perceptual remediation/compensation;DME and/or AE instruction;Patient/family education;Neuromuscular education     OT Goals(Current goals can be found in the care plan section) Acute Rehab OT Goals Patient Stated Goal: To go home OT Goal Formulation: With patient Potential to Achieve Goals: Good ADL Goals Pt Will Perform Lower Body Dressing: with modified independence Pt Will Transfer to Toilet: with modified independence Additional ADL Goal #1: Pt. will follow 3/3 hip precautions 100% of the time during ADLs.  OT Frequency: Min 2X/week   Barriers to D/C:            Co-evaluation              End of Session    Activity Tolerance: Patient limited by pain Patient left: in chair;with chair alarm set;with call bell/phone within reach, Family present   Time: 1038-1101 OT Time Calculation (min): 23 min Charges:  OT General Charges $OT Visit: 1 Procedure OT Evaluation $OT Eval Moderate Complexity: 1 Procedure G-Codes:    [name], MS, OTR/L 08/02/2016, 12:35 PM

## 2016-08-02 NOTE — Care Management (Signed)
Met with patient and her mother at bedside. Patient lives at home with her mother. Mother states she is unable to care for patient at discharge. She and patient both agree that SNF is the best choice for patient at this point.

## 2016-08-02 NOTE — Progress Notes (Signed)
  Subjective: 1 Day Post-Op Procedure(s) (LRB): ARTHROPLASTY BIPOLAR HIP (HEMIARTHROPLASTY) (Right) Patient reports pain as moderate.   Patient seen in rounds with Dr. Marry Guan. Patient is well, and has had no acute complaints or problems Plan is to go Home after hospital stay. Negative for chest pain and shortness of breath Fever: no Gastrointestinal: Negative for nausea and vomiting  Objective: Vital signs in last 24 hours: Temp:  [97.9 F (36.6 C)-99.2 F (37.3 C)] 98.4 F (36.9 C) (12/21 0338) Pulse Rate:  [82-109] 86 (12/21 0338) Resp:  [10-23] 18 (12/21 0338) BP: (109-150)/(62-90) 109/62 (12/21 0338) SpO2:  [82 %-100 %] 96 % (12/21 0338) FiO2 (%):  [28 %] 28 % (12/20 2302)  Intake/Output from previous day:  Intake/Output Summary (Last 24 hours) at 08/02/16 0658 Last data filed at 08/02/16 0600  Gross per 24 hour  Intake             1932 ml  Output             1290 ml  Net              642 ml    Intake/Output this shift: Total I/O In: 1460 [I.V.:1400; Other:60] Out: [id number] [Urine:690; Blood:400]  Labs:  Recent Labs  08/02/16 0403  HGB 10.4*    Recent Labs  08/02/16 0403  WBC 7.3  RBC 3.55*  HCT 30.5*  PLT 192    Recent Labs  08/02/16 0403  NA 139  K 3.6  CL 106  CO2 28  BUN 6  CREATININE 0.49  GLUCOSE 183*  CALCIUM 7.9*    Recent Labs  08/01/16 1411 08/02/16 0403  INR 1.35 1.18     EXAM General - Patient is Alert and Oriented Extremity - Sensation intact distally Dorsiflexion/Plantar flexion intact No cellulitis present Compartment soft Dressing/Incision - clean, dry, Hemovac intact Motor Function - intact, moving foot and toes well on exam.   Past Medical History:  Diagnosis Date  . Anxiety   . Breast cancer [facility]) June 2015   Invasive lobular carcinoma, 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 ER/ PR+; Her 2 neu not overexpressed, microscopic positive margin (skin).  . Depression   . GERD (gastroesophageal reflux disease)   .  Hypertension   . 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   Weight as of this encounter: 65.6 kg (144 lb 9.6 oz). Advance diet Up with therapy D/C IV fluids Plan to discharge home if safe and independent. Follow-up in 6 weeks with orthopedics.  Staples removed at 2 weeks.   DVT Prophylaxis - Lovenox, Foot Pumps and TED hose Weight-Bearing as tolerated to right  leg  Reche Dixon, PA-C Orthopaedic Surgery 08/02/2016, 6:58 AM

## 2016-08-02 NOTE — Evaluation (Signed)
Physical Therapy Evaluation Patient Details Name: Tina Sharp MRN: [id number] DOB: November 28, 1965 Today's Date: 08/02/2016   History of Present Illness  presented to ER status post fall in home environment, sustaining R femoral neck fracture; status post R hip hemiarthroplasty, posterior THPs, WBAT (08/01/16).  Clinical Impression  Upon evaluation, patient alert and oriented; follows commands.  Demonstrates fair post-op strength and functional use of R LE with movement transitions; constant cuing for awareness/adherence to THPs.  Residual strength/coordination deficits in bilat UEs/LEs noted from previous CVA, but not significant barrier to mobility at this time. Currently requiring mod assist for bed mobility; min/mod assist for sit/stand and very short-distance gait (4-5 steps) with RW.  Significant difficulty with dynamic weight shifting; unable to fully unweight/advance extremities with gait efforts, tending to slide/scoot LEs across floor instead.  Generally unsteady and unsafe to complete without hands-on assist at all times. Would benefit from skilled PT to address above deficits and promote optimal return to PLOF; recommend transition to STR upon discharge from acute hospitalization.     Follow Up Recommendations SNF    Equipment Recommendations       Recommendations for Other Services       Precautions / Restrictions Precautions Precautions: Fall;Posterior Hip Restrictions Weight Bearing Restrictions: Yes RLE Weight Bearing: Weight bearing as tolerated      Mobility  Bed Mobility Overal bed mobility: Needs Assistance Bed Mobility: Supine to Sit     Supine to sit: Mod assist     General bed mobility comments: assist for LE management, truncal elevation and scooting to edge of bed  Transfers Overall transfer level: Needs assistance Equipment used: Rolling walker (2 wheeled) Transfers: Sit to/from Stand Sit to Stand: Min assist;Mod assist         General  transfer comment: very slow and effortful, requires UE support to complete; constant cuing/assist for neutral R LE position (tends to IR)  Ambulation/Gait Ambulation/Gait assistance: Mod assist Ambulation Distance (Feet): 4 Feet Assistive device: Rolling walker (2 wheeled)       General Gait Details: forward flexed posture; unable to fully unweight/advance LEs, tending to scoot/pivot across floor.  constant cuing for awareness of R LE position. Unable to tolerate additional distance at this time  Stairs            Wheelchair Mobility    Modified Rankin (Stroke Patients Only)       Balance Overall balance assessment: Needs assistance Sitting-balance support: Feet supported;No upper extremity supported Sitting balance-Leahy Scale: Good     Standing balance support: Bilateral upper extremity supported Standing balance-Leahy Scale: Fair                               Pertinent Vitals/Pain Pain Assessment: 0-10 Pain Score: 8  Pain Descriptors / Indicators: Aching Pain Intervention(s): Limited activity within patient's tolerance;Monitored during session;Premedicated before session;Repositioned    Home Living Family/patient expects to be discharged to:: Private residence Living Arrangements: Spouse/significant other;Parent Available Help at Discharge: Available 24 hours/day Type of Home: House Home Access: Ramped entrance     Home Layout: One level Home Equipment: Environmental consultant - 2 wheels;Hospital bed;Wheelchair - manual;Shower seat      Prior Function Level of Independence: Independent with assistive device(s)         Comments: Mod indep for ADLs, household mobility with RW.  Actively participating with HHPT, OT prior to admission (after rehab stay March-June of this year after CVA)  Hand Dominance   Dominant Hand: Right    Extremity/Trunk Assessment   Upper Extremity Assessment Upper Extremity Assessment:  (bilat UEs with decrease in  coordination and reaction time, reported sensory deficit to light touch in bilat hands due to previous CVA)    Lower Extremity Assessment Lower Extremity Assessment:  (L LE grossly 4/5, R LE grossly 3-/5; baseline sensory deficit in bilat feet after previous CVA)       Communication   Communication: No difficulties  Cognition Arousal/Alertness: Awake/alert Behavior During Therapy: WFL for tasks assessed/performed Overall Cognitive Status: Within Functional Limits for tasks assessed                      General Comments      Exercises Other Exercises Other Exercises: Supine LE therex, 1x10, act ROM: ankle pumps, quad sets, SAQs, heel slides and hip abduct/adduct. Min cuing for technique and movement within THPs; good isolated strength/activation in R hip   Assessment/Plan    PT Assessment Patient needs continued PT services  PT Problem List Decreased strength;Decreased range of motion;Decreased balance;Decreased activity tolerance;Decreased mobility;Decreased coordination;Decreased safety awareness;Decreased knowledge of precautions;Decreased knowledge of use of DME          PT Treatment Interventions DME instruction;Gait training;Functional mobility training;Therapeutic activities;Therapeutic exercise;Balance training;Patient/family education    PT Goals (Current goals can be found in the Care Plan section)  Acute Rehab PT Goals Patient Stated Goal: to go home PT Goal Formulation: With patient/family Time For Goal Achievement: 08/16/16 Potential to Achieve Goals: Good    Frequency BID   Barriers to discharge        Co-evaluation               End of Session Equipment Utilized During Treatment: Gait belt Activity Tolerance: Patient tolerated treatment well Patient left: in chair;with call bell/phone within reach;with family/visitor present (alarm not available; RN informed/aware of patient position) Nurse Communication: Mobility status          Time: [id number] PT Time Calculation (min) (ACUTE ONLY): 25 min   Charges:   PT Evaluation $PT Eval Moderate Complexity: 1 Procedure PT Treatments $Therapeutic Exercise: 8-22 mins   PT G Codes:        Jaydan Chretien H. Owens Shark, PT, DPT, NCS 08/02/16, 10:25 AM [phone]

## 2016-08-02 NOTE — Progress Notes (Signed)
PT is recommending SNF. Clinical Education officer, museum (CSW) met with patient and her husband at bedside. Patient was tearful and reported that she does not want to go to SNF. Patient's husband reported that patient may change her mind in a few days about going to SNF. CSW provided bed offers. [facility] chose [facility] as a back up to home health. Patient prefers to go home but is agreeable for [facility] to start [facility] authorization. [facility] liaision is aware of accepted bed offer and will start [employer] authorization today. RN case manager aware of above. CSW will continue to follow and assist as needed.   [employer], LCSW [phone]

## 2016-08-02 NOTE — Addendum Note (Signed)
Addendum  created 08/02/16 0050 by Hedda Slade, CRNA   Anesthesia Intra Meds edited

## 2016-08-03 ENCOUNTER — Encounter: Payer: Self-pay | Admitting: Orthopedic Surgery

## 2016-08-03 LAB — BASIC METABOLIC PANEL
ANION GAP: 4 — AB (ref 5–15)
BUN: 9 mg/dL (ref 6–20)
CALCIUM: 7.6 mg/dL — AB (ref 8.9–10.3)
CO2: 30 mmol/L (ref 22–32)
Chloride: 109 mmol/L (ref 101–111)
Creatinine, Ser: 0.55 mg/dL (ref 0.44–1.00)
Glucose, Bld: 89 mg/dL (ref 65–99)
Potassium: 3.4 mmol/L — ABNORMAL LOW (ref 3.5–5.1)
SODIUM: 143 mmol/L (ref 135–145)

## 2016-08-03 LAB — CBC
HCT: 26.2 % — ABNORMAL LOW (ref 35.0–47.0)
Hemoglobin: 8.8 g/dL — ABNORMAL LOW (ref 12.0–16.0)
MCH: 28.9 pg (ref 26.0–34.0)
MCHC: 33.8 g/dL (ref 32.0–36.0)
MCV: 85.5 fL (ref 80.0–100.0)
PLATELETS: 204 10*3/uL (ref 150–440)
RBC: 3.06 MIL/uL — ABNORMAL LOW (ref 3.80–5.20)
RDW: 15.1 % — AB (ref 11.5–14.5)
WBC: 7.4 10*3/uL (ref 3.6–11.0)

## 2016-08-03 LAB — PROTIME-INR
INR: 1.13
PROTHROMBIN TIME: 14.6 s (ref 11.4–15.2)

## 2016-08-03 MED ORDER — BISACODYL 10 MG RE SUPP
10.0000 mg | Freq: Every day | RECTAL | Status: DC | PRN
  Administered 2016-08-03: 10 mg via RECTAL
  Filled 2016-08-03: qty 1

## 2016-08-03 MED ORDER — LACTULOSE 10 GM/15ML PO SOLN: 10.0000 g | Freq: Every day | ORAL | Status: DC | PRN

## 2016-08-03 NOTE — Progress Notes (Signed)
Physical Therapy Treatment Patient Details Name: Tina Sharp MRN: [id number] DOB: 10/21/1965 Today's Date: 08/03/2016    History of Present Illness Pt. is a 50 y.o. female who was admitted to [facility] for a Right Hip Hemiartroplasty. Pt. has a a recent history of CVA.    PT Comments    Pt is making gradual progress towards goals, however very emotional and tearful this session, frustrated by lack of progress. Pt continues to be limited by pain, needed + 2 for all mobility this date. Fair endurance with there-ex. Will continue to progress.  Follow Up Recommendations  SNF     Equipment Recommendations       Recommendations for Other Services       Precautions / Restrictions Precautions Precautions: Fall;Posterior Hip Precaution Booklet Issued: Yes (comment) Restrictions Weight Bearing Restrictions: Yes RLE Weight Bearing: Weight bearing as tolerated    Mobility  Bed Mobility Overal bed mobility: Needs Assistance Bed Mobility: Sit to Supine     Supine to sit: Mod assist Sit to supine: Max assist;+2 for physical assistance   General bed mobility comments: assist for bringing B LE back to bed and assistance with positioning. Pt very tearful and needed +2 for assistance  Transfers Overall transfer level: Needs assistance Equipment used: Rolling walker (2 wheeled) Transfers: Sit to/from Stand Sit to Stand: Mod assist;+2 physical assistance         General transfer comment: several attempts with max assist, however unable to clear buttock off chair. +2 assist required for upright posture with heavy cues for sequencing.   Ambulation/Gait Ambulation/Gait assistance: Mod assist;+2 physical assistance Ambulation Distance (Feet): 3 Feet Assistive device: Rolling walker (2 wheeled) Gait Pattern/deviations: Step-to pattern     General Gait Details: antalgic gait pattern from recliner->chair with +2 assist and RW used for assistance. Pt unable to place weight on R LE  without increased pain. Cues given for weight shifting in order to step. Pt prefers to slide foot across floor. Unable to further progress mobility.   Stairs            Wheelchair Mobility    Modified Rankin (Stroke Patients Only)       Balance                                    Cognition Arousal/Alertness: Awake/alert Behavior During Therapy: WFL for tasks assessed/performed Overall Cognitive Status: Within Functional Limits for tasks assessed                      Exercises Other Exercises Other Exercises: seated ther-ex performed including ankle pumps, quad sets, hip abd/add, and SAQ. All ther-ex performed x 12 reps on R LE with mod assist for sequencing.    General Comments        Pertinent Vitals/Pain Pain Assessment: 0-10 Pain Score: 10-Worst pain ever Pain Location: R hip/thigh/knee Pain Descriptors / Indicators: Operative site guarding;Dull;Discomfort Pain Intervention(s): Limited activity within patient's tolerance    Home Living                      Prior Function            PT Goals (current goals can now be found in the care plan section) Acute Rehab PT Goals Patient Stated Goal: to go to rehab today PT Goal Formulation: With patient/family Time For Goal Achievement: 08/16/16 Potential to Achieve Goals: Good  Progress towards PT goals: Progressing toward goals    Frequency    BID      PT Plan Current plan remains appropriate    Co-evaluation             End of Session Equipment Utilized During Treatment: Gait belt Activity Tolerance: Patient limited by pain;Patient limited by fatigue Patient left: in bed;with bed alarm set     Time: 1313-1340 PT Time Calculation (min) (ACUTE ONLY): 27 min  Charges:  $Gait Training: 8-22 mins $Therapeutic Exercise: 8-22 mins                    G Codes:      Shenika Quint 09/01/2016, 2:01 PM  Greggory Stallion, PT, DPT [phone]

## 2016-08-03 NOTE — Progress Notes (Signed)
Patient is medically stable for D/C to [facility] today. Per [facility] liaison patient will go to room 711 and [employer] authorization has been received.. RN will call report to RN Yaakov Guthrie at [phone] and arrange EMS for transport. Clinical Education officer, museum (CSW) sent D/C orders to [facility] today via [facility]. Patient is aware of above. Patient's mother Pamala Hurry is at bedside and aware of D/C today. Please reconsult if future social work needs arise. CSW signing off.   [employer], LCSW [phone]

## 2016-08-03 NOTE — Progress Notes (Signed)
  Subjective: 2 Days Post-Op Procedure(s) (LRB): ARTHROPLASTY BIPOLAR HIP (HEMIARTHROPLASTY) (Right) Patient reports pain as moderate.   Patient seen in rounds with Dr. Marry Guan. Patient is well, and has had no acute complaints or problems Plan is to go to rehabilitation after hospital stay. Negative for chest pain and shortness of breath Fever: no Gastrointestinal: Negative for nausea and vomiting  Objective: Vital signs in last 24 hours: Temp:  [97.5 F (36.4 C)-98.4 F (36.9 C)] 98.4 F (36.9 C) (12/22 0435) Pulse Rate:  [75-91] 75 (12/22 0435) Resp:  [18] 18 (12/22 0435) BP: (96-104)/(43-61) 96/43 (12/22 0435) SpO2:  [94 %-100 %] 96 % (12/22 0435) Weight:  [66.3 kg (146 lb 3.2 oz)] 66.3 kg (146 lb 3.2 oz) (12/22 0435)  Intake/Output from previous day:  Intake/Output Summary (Last 24 hours) at 08/03/16 0646 Last data filed at 08/03/16 0441  Gross per 24 hour  Intake             2980 ml  Output              650 ml  Net             2330 ml    Intake/Output this shift: Total I/O In: -  Out: 50 [Drains:50]  Labs:  Recent Labs  08/02/16 0403 08/03/16 0346  HGB 10.4* 8.8*    Recent Labs  08/02/16 0403 08/03/16 0346  WBC 7.3 7.4  RBC 3.55* 3.06*  HCT 30.5* 26.2*  PLT 192 204    Recent Labs  08/02/16 0403 08/03/16 0346  NA 139 143  K 3.6 3.4*  CL 106 109  CO2 28 30  BUN 6 9  CREATININE 0.49 0.55  GLUCOSE 183* 89  CALCIUM 7.9* 7.6*    Recent Labs  08/02/16 0403 08/03/16 0346  INR 1.18 1.13     EXAM General - Patient is Alert and Oriented Extremity - Sensation intact distally Dorsiflexion/Plantar flexion intact No cellulitis present Compartment soft Dressing/Incision - clean, dry, Hemovac intactAnd removed with no complication. Motor Function - intact, moving foot and toes well on exam. The patient ambulated 4 feet with physical therapy  Past Medical History:  Diagnosis Date  . Anxiety   . Breast cancer [facility]) June 2015   Invasive  lobular carcinoma, 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 ER/ PR+; Her 2 neu not overexpressed, microscopic positive margin (skin).  . Depression   . GERD (gastroesophageal reflux disease)   . Hypertension   . 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   Weight as of this encounter: 66.3 kg (146 lb 3.2 oz). Advance diet Up with therapy D/C IV fluids Plan to discharge hoTo rehabilitation today or tomorrow, if safe and independent. Follow-up in 6 weeks with orthopedics.  Staples removed at 2 weeks. Bowel management.  DVT Prophylaxis - Lovenox, Foot Pumps and TED hose Weight-Bearing as tolerated to right  leg  Reche Dixon, PA-C Orthopaedic Surgery 08/03/2016, 6:46 AM

## 2016-08-03 NOTE — Progress Notes (Signed)
ANTICOAGULATION CONSULT NOTE - Initial Consult  Pharmacy Consult for VKA Indication: ppx p CVA  No Known Allergies  Patient Measurements: Height: 5\' 2"  (157.5 cm) Weight: 146 lb 3.2 oz (66.3 kg) IBW/kg (Calculated) : 50.1 Heparin Dosing Weight:   Vital Signs: Temp: 99.1 F (37.3 C) (12/22 0714) Temp Source: Oral (12/22 0714) BP: 92/54 (12/22 0714) Pulse Rate: 88 (12/22 0714)  Labs:  Recent Labs  08/01/16 1411 08/02/16 0403 08/03/16 0346  HGB  --  10.4* 8.8*  HCT  --  30.5* 26.2*  PLT  --  192 204  LABPROT 16.8* 15.1 14.6  INR 1.35 1.18 1.13  CREATININE  --  0.49 0.55    Estimated Creatinine Clearance: 73.5 mL/min (by C-G formula based on SCr of 0.55 mg/dL).   Medical History: Past Medical History:  Diagnosis Date  . Anxiety   . Breast cancer [facility]) June 2015   Invasive lobular carcinoma, 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 ER/ PR+; Her 2 neu not overexpressed, microscopic positive margin (skin).  . Depression   . GERD (gastroesophageal reflux disease)   . Hypertension   . 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 sodium chloride 100 mL/hr at 08/01/16 2308    Assessment: 73 yof cc fall on VKA p CVA. Pharmacy consulted to resume VKA. Appears patient was taking warfarin 5mg  daily at home. INR on admission therapeutic at 2.4-2.78.  Goal of Therapy:  INR 2-3 Monitor platelets by anticoagulation protocol: Yes   Plan:  Warfarin 5 mg po daily starting 12/21. Will follow daily INR starting 12/22. Plan to D/C enoxaparin when INR therapeutic.   Irini Leet M Malachi Kinzler, Pharm.D., BCPS Clinical Pharmacist 08/03/2016,10:50 AM

## 2016-08-03 NOTE — Discharge Planning (Addendum)
Patient IV removed.  DC papers given, explained and educated.  Informed of suggested FU appts and appts made.  Pain med scripts signed and placed in packet for facility.  Patient given pain meds prior to EMS arrival (1745).  Patient had BM 12/22.  Report called to [facility] and s/w Yaakov Guthrie, RN.  RN assessment and VS revealed stability for DC to  Facility for rehab.  EMS contacted to transport and will also deliver discharge patient packet.  Family aware of discharge to facility and going to room 711.  Awaiting EMS arrival.

## 2016-08-03 NOTE — Progress Notes (Signed)
Physical Therapy Treatment Patient Details Name: Tina Sharp MRN: [id number] DOB: Dec 17, 1965 Today's Date: 08/03/2016    History of Present Illness Pt. is a 50 y.o. female [facility]. Pt. has a a recent history of a CVA.    PT Comments    Pt is making good progress towards goals, requesting extra IV access be removed to assist with bed mobility. Good endurance with there-ex, however fatigues quickly with ambulation efforts. Needs hands on assist for all OOB mobility as well as maintaining hip precautions. Pt motivated to perform therapy. Will continue to progress.  Follow Up Recommendations  SNF     Equipment Recommendations       Recommendations for Other Services       Precautions / Restrictions Precautions Precautions: Fall;Posterior Hip Precaution Booklet Issued: Yes (comment) Restrictions Weight Bearing Restrictions: Yes RLE Weight Bearing: Weight bearing as tolerated    Mobility  Bed Mobility Overal bed mobility: Needs Assistance Bed Mobility: Supine to Sit     Supine to sit: Mod assist     General bed mobility comments: assist for trunk elevation and sliding B LE off bed. Once seated at EOB, needs assist for upright posture  Transfers Overall transfer level: Needs assistance Equipment used: Rolling walker (2 wheeled) Transfers: Sit to/from Stand Sit to Stand: Min assist;Mod assist         General transfer comment: very slow transfer with cues for upright posture and to tuck hips under trunk. Once standing, pt prefers R IR, cues for correction. Heavy use of support on RW  Ambulation/Gait Ambulation/Gait assistance: Mod assist Ambulation Distance (Feet): 5 Feet Assistive device: Rolling walker (2 wheeled) Gait Pattern/deviations: Step-to pattern     General Gait Details: slow ambulation with step to pattern with assist required for weight shifting in order to take steps. Pt follows commands, however  fatigues quickly unable to ambulate further at this time.    Stairs            Wheelchair Mobility    Modified Rankin (Stroke Patients Only)       Balance                                    Cognition Arousal/Alertness: Awake/alert Behavior During Therapy: WFL for tasks assessed/performed Overall Cognitive Status: Within Functional Limits for tasks assessed                      Exercises Other Exercises Other Exercises: supine ther-ex performed x 12 reps including ankle pumps, quad sets, glut sets, SAQ, and hip abd/add. All ther-ex provided with cues for technique and min/mod assist.    General Comments        Pertinent Vitals/Pain Pain Assessment: 0-10 Pain Score: 6  Pain Location: R hip/thigh/knee Pain Descriptors / Indicators: Operative site guarding;Dull;Discomfort Pain Intervention(s): Limited activity within patient's tolerance;RN gave pain meds during session    Home Living                      Prior Function            PT Goals (current goals can now be found in the care plan section) Acute Rehab PT Goals Patient Stated Goal: To go home PT Goal Formulation: With patient/family Time For Goal Achievement: 08/16/16 Potential to Achieve Goals: Good Progress towards PT goals: Progressing toward goals  Frequency    BID      PT Plan Current plan remains appropriate    Co-evaluation             End of Session Equipment Utilized During Treatment: Gait belt Activity Tolerance: Patient limited by pain;Patient limited by fatigue Patient left: in chair;with call bell/phone within reach     Time: 0943-1006 PT Time Calculation (min) (ACUTE ONLY): 23 min  Charges:  $Gait Training: 8-22 mins $Therapeutic Exercise: 8-22 mins                    G Codes:      Devynn Scheff 2016-08-19, 10:22 AM  Greggory Stallion, PT, DPT [phone]

## 2016-08-03 NOTE — Progress Notes (Signed)
Occupational Therapy Treatment Patient Details Name: Tina Sharp MRN: [id number] DOB: 09/04/1965 Today's Date: 08/03/2016    History of present illness Pt. is a 50 y.o. female who was admitted to [facility] for a Right Hip Hemiartroplasty. Pt. has a a recent history of CVA.   OT comments  Pt. was provided with 2 built-up adaptive foam for utensils, as pt. is having difficulty handling standard utensils. Pt. Reports being aware of how to use A/E for LE ADLs. Pt. has just returned to bed after PT. Pt. reports being upset that she is afraid to put weight through her RLE due to the pain. Pt. Continues to benefit from skilled OT services. Pt. Plans to go to [facility] for today.   Follow Up Recommendations  SNF    Equipment Recommendations       Recommendations for Other Services      Precautions / Restrictions Precautions Precautions: Fall;Posterior Hip Precaution Booklet Issued: Yes (comment) Restrictions Weight Bearing Restrictions: Yes RLE Weight Bearing: Weight bearing as tolerated                                                                                      General ADL Comments: Pt. was provided with 1" thick foam for utensils as pt. is having difficulty handling standard utensils.      Vision  No change from baseline                   Perception     Praxis      Cognition   Behavior During Therapy: WFL for tasks assessed/performed Overall Cognitive Status: Within Functional Limits for tasks assessed                       Extremity/Trunk Assessment               Exercises   Shoulder Instructions   General Comments      Pertinent Vitals/ Pain       Pain Assessment: 0-10 Pain Score: 10-Worst pain ever Pain Location: Right Hip/LE Pain Descriptors / Indicators: Operative site guarding;Dull;Discomfort Pain Intervention(s): Limited activity within patient's tolerance  Home Living                                           Prior Functioning/Environment              Frequency  Min 2X/week        Progress Toward Goals  OT Goals(current goals can now be found in the care plan section)     Acute Rehab OT Goals Patient Stated Goal: To go to rehab today OT Goal Formulation: With patient Potential to Achieve Goals: Good  Plan      Co-evaluation                 End of Session     Activity Tolerance Patient limited by pain   Patient Left in chair;with chair alarm set;with call bell/phone within reach   Nurse Communication  Time: 1355-1400 OT Time Calculation (min): 5 min  Charges: OT General Charges $OT Visit: 1 Procedure  Tina Carina, MS, OTR/L 08/03/2016, 2:06 PM

## 2016-08-03 NOTE — Discharge Summary (Signed)
[facility] [place] NAME: Tina Sharp    MR#:  [id number]  DATE OF BIRTH:  [birth year]  DATE OF ADMISSION:  07/30/2016 ADMITTING PHYSICIAN: Harrie Foreman, MD  DATE OF DISCHARGE: 08/03/2016  PRIMARY CARE PHYSICIAN: Chriss Czar, MD    ADMISSION DIAGNOSIS:  Fall, initial encounter [W19.XXXA] Closed displaced fracture of right femoral neck ([place]) [S72.001A]  DISCHARGE DIAGNOSIS:  Active Problems:   Hip fracture (HCC)   SECONDARY DIAGNOSIS:   Past Medical History:  Diagnosis Date  . Anxiety   . Breast cancer [facility]) June 2015   Invasive lobular carcinoma, 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 ER/ PR+; Her 2 neu not overexpressed, microscopic positive margin (skin).  . Depression   . GERD (gastroesophageal reflux disease)   . Hypertension   . 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  1. Right hip fracture- secondary to mechanical fall.   s/p ORIF POD # 2.  - tolerating PT and pain is under fair control on Oral Vicodin.  - d/c to SNF for ongoing rehab and pt. Is in agreement.   2. History of previous breast cancer - pt. Will continue Arimidex.  3. History of previous CVA- pt. Will resume her Coumadin.  - she has been bridged with Lovenox for a few days along with Coumadin.  - she will  Continue atorvastatin.  4. GERD- she will resume her Omeprazole.   5. Depression/anxiety- she will continue Cymbalta, Klonopin  DISCHARGE CONDITIONS:   Stable  CONSULTS OBTAINED:  Treatment Team:  Dereck Leep, MD  DRUG ALLERGIES:  No Known Allergies  DISCHARGE MEDICATIONS:   Allergies as of 08/03/2016   No Known Allergies     Medication List    TAKE these  medications   anastrozole 1 MG tablet Commonly known as:  ARIMIDEX Take 1 tablet (1 mg total) by mouth every evening.   atorvastatin 40 MG tablet Commonly known as:  LIPITOR Take 1 tablet (40 mg total) by mouth daily at 6 PM.   b complex vitamins tablet Take 1 tablet by mouth daily.   baclofen 10 MG tablet Commonly known as:  LIORESAL Take 10 mg by mouth 3 (three) times daily.   clonazePAM 0.5 MG disintegrating tablet Commonly known as:  KLONOPIN Take 0.5 mg by mouth 2 (two) times daily.   cyanocobalamin 1000 MCG tablet Take 1 tablet (1,000 mcg total) by mouth daily.   DULoxetine 60 MG capsule Commonly known as:  CYMBALTA Take 60 mg by mouth daily.   enoxaparin 40 MG/0.4ML injection Commonly known as:  LOVENOX Inject 0.4 mLs (40 mg total) into the skin daily.   folic acid 1 MG tablet Commonly known as:  FOLVITE Take 1 tablet (1 mg total) by mouth daily.   gabapentin 600 MG tablet Commonly known as:  NEURONTIN Take 600 mg by mouth 4 (four) times daily.   omeprazole 20 MG capsule Commonly known as:  PRILOSEC Take 20 mg by mouth as needed (heart burn).   oxyCODONE 5 MG immediate release tablet Commonly known as:  Oxy IR/ROXICODONE Take 1-2 tablets (5-10 mg total) by mouth every 4 (four) hours as needed for breakthrough pain ((for MODERATE breakthrough pain)).   prochlorperazine 5 MG tablet  Commonly known as:  COMPAZINE Take 5 mg by mouth every 6 (six) hours as needed for nausea or vomiting.   traMADol 50 MG tablet Commonly known as:  ULTRAM Take 1-2 tablets (50-100 mg total) by mouth every 4 (four) hours as needed for moderate pain.   warfarin 5 MG tablet Commonly known as:  COUMADIN Take 2.5-5 mg by mouth daily. Take 2.5 mg for 3 days, then take 5 mg until returning for lab draw.         DISCHARGE INSTRUCTIONS:   DIET:  Regular diet  DISCHARGE CONDITION:  Stable  ACTIVITY:  Activity as tolerated  OXYGEN:  Home Oxygen: No.   Oxygen Delivery:  room air  DISCHARGE LOCATION:  nursing home   If you experience worsening of your admission symptoms, develop shortness of breath, life threatening emergency, suicidal or homicidal thoughts you must seek medical attention immediately by calling 911 or calling your MD immediately  if symptoms less severe.  You Must read complete instructions/literature along with all the possible adverse reactions/side effects for all the Medicines you take and that have been prescribed to you. Take any new Medicines after you have completely understood and accpet all the possible adverse reactions/side effects.   Please note  You were cared for by a hospitalist during your hospital stay. If you have any questions about your discharge medications or the care you received while you were in the hospital after you are discharged, you can call the unit and asked to speak with the hospitalist on call if the hospitalist that took care of you is not available. Once you are discharged, your primary care physician will handle any further medical issues. Please note that NO REFILLS for any discharge medications will be authorized once you are discharged, as it is imperative that you return to your primary care physician (or establish a relationship with a primary care physician if you do not have one) for your aftercare needs so that they can reassess your need for medications and monitor your lab values.     Today   Still having significant pain in the right hip post surgery.  Tolerating PT.  Will d/c to SNF for ongoing rehab.  No other complaints presently.  Mother at bedside.   VITAL SIGNS:  Blood pressure (!) 92/54, pulse 88, temperature 99.1 F (37.3 C), temperature source Oral, resp. rate 18, height 5\' 2"  (1.575 m), weight 66.3 kg (146 lb 3.2 oz), SpO2 97 %.  I/O:   Intake/Output Summary (Last 24 hours) at 08/03/16 1321 Last data filed at 08/03/16 0953  Gross per 24 hour  Intake          1768.33 ml  Output                50 ml  Net          1718.33 ml    PHYSICAL EXAMINATION:  GENERAL:  50 y.o.-year-old patient sitting up in chair in no acute distress.  EYES: Pupils equal, round, reactive to light and accommodation. No scleral icterus. Extraocular muscles intact.  HEENT: Head atraumatic, normocephalic. Oropharynx and nasopharynx clear.  NECK:  Supple, no jugular venous distention. No thyroid enlargement, no tenderness.  LUNGS: Normal breath sounds bilaterally, no wheezing, rales,rhonchi. No use of accessory muscles of respiration.  CARDIOVASCULAR: S1, S2 normal. No murmurs, rubs, or gallops.  ABDOMEN: Soft, non-tender, non-distended. Bowel sounds present. No organomegaly or mass.  EXTREMITIES: No pedal edema, cyanosis, or clubbing. Right hip dressing in place  w/ no drainage or bleeding.  NEUROLOGIC: Cranial nerves II through XII are intact. No focal motor or sensory defecits b/l.  PSYCHIATRIC: The patient is alert and oriented x 3. Good affect.  SKIN: No obvious rash, lesion, or ulcer.   DATA REVIEW:   CBC  Recent Labs Lab 08/03/16 0346  WBC 7.4  HGB 8.8*  HCT 26.2*  PLT 204    Chemistries   Recent Labs Lab 08/03/16 0346  NA 143  K 3.4*  CL 109  CO2 30  GLUCOSE 89  BUN 9  CREATININE 0.55  CALCIUM 7.6*    Cardiac Enzymes No results for input(s): TROPONINI in the last 168 hours.  Microbiology Results  Results for orders placed or performed during the hospital encounter of 07/30/16  Surgical PCR screen     Status: None   Collection Time: 07/30/16 10:38 AM  Result Value Ref Range Status   MRSA, PCR NEGATIVE NEGATIVE Final   Staphylococcus aureus NEGATIVE NEGATIVE Final    Comment:        The Xpert SA Assay (FDA approved for NASAL specimens in patients over 31 years of age), is one component of a comprehensive surveillance program.  Test performance has been validated by [facility] for patients greater than or equal to 64 year old. It is not intended to  diagnose infection nor to guide or monitor treatment.     RADIOLOGY:  Dg Hip Port Unilat With Pelvis 1v Right  Result Date: 08/01/2016 CLINICAL DATA:  50 year old female status post right hip hemiarthroplasty. EXAM: DG HIP (WITH OR WITHOUT PELVIS) 1V PORT RIGHT COMPARISON:  Radiographs dated 07/30/2016 FINDINGS: There has been interval right hip hemiarthroplasty. The orthopedic hardware appears intact and in anatomic alignment. The bones are osteopenic. No acute fracture identified. Small pockets of soft tissue air and multiple cutaneous surgical clips noted over the right hip. A drainage tube is seen IMPRESSION: Right hip hemiarthroplasty appears intact and in anatomic alignment. Osteopenia.  No acute fracture. Electronically Signed   By: Anner Crete M.D.   On: 08/01/2016 23:00      Management plans discussed with the patient, family and they are in agreement.  CODE STATUS:     Code Status Orders        Start     Ordered   07/30/16 0650  Full code  Continuous     07/30/16 0649    Code Status History    Date Active Date Inactive Code Status Order ID Comments User Context   11/07/2015  8:44 PM 11/10/2015  6:30 PM Full Code [id number]  Dustin Flock, MD Inpatient   06/21/2015  4:39 PM 06/22/2015  8:36 PM Full Code [id number]  Radene Gunning, NP Inpatient   06/11/2015  5:13 PM 06/14/2015  8:01 PM Full Code [id number]  Alexis Goodell, MD Inpatient      TOTAL TIME TAKING CARE OF THIS PATIENT: 40 minutes.    Henreitta Leber M.D on 08/03/2016 at 1:21 PM  Between 7am to 6pm - Pager - [phone]  After 6pm go to [email or web address] - Proofreader  [facility]  Office  [phone]  CC: Primary care physician; Chriss Czar, MD

## 2016-08-03 NOTE — Clinical Social Work Placement (Signed)
   CLINICAL SOCIAL WORK PLACEMENT  NOTE  Date:  08/03/2016  Patient Details  Name: Tina Sharp MRN: [id number] Date of Birth: September 05, 1965  Clinical Social Work is seeking post-discharge placement for this patient at the [place] level of care (*CSW will initial, date and re-position this form in  chart as items are completed):  Yes   Patient/family provided with [place] Work Department's list of facilities offering this level of care within the geographic area requested by the patient (or if unable, by the patient's family).  Yes   Patient/family informed of their freedom to choose among providers that offer the needed level of care, that participate in Medicare, Medicaid or managed care program needed by the patient, have an available bed and are willing to accept the patient.  Yes   Patient/family informed of [facility]'s ownership interest in [facility] and [facility], as well as of the fact that they are under no obligation to receive care at these facilities.  PASRR submitted to EDS on       PASRR number received on       Existing PASRR number confirmed on 08/02/16     FL2 transmitted to all facilities in geographic area requested by pt/family on 08/02/16     FL2 transmitted to all facilities within larger geographic area on       Patient informed that his/her managed care company has contracts with or will negotiate with certain facilities, including the following:        Yes   Patient/family informed of bed offers received.  Patient chooses bed at  ([facility] )     Physician recommends and patient chooses bed at      Patient to be transferred to  ([facility] ) on 08/03/16.  Patient to be transferred to facility by  [facility] )     Patient family notified on 08/03/16 of transfer.  Name of family member notified:   (Patient's mother Tina Sharp is at bedside and aware of D/C today. )     PHYSICIAN       Additional  Comment:    _______________________________________________ Darrold Bezek, Veronia Beets, LCSW 08/03/2016, 1:57 PM

## 2016-08-07 ENCOUNTER — Ambulatory Visit (INDEPENDENT_AMBULATORY_CARE_PROVIDER_SITE_OTHER): Payer: Managed Care, Other (non HMO) | Admitting: *Deleted

## 2016-08-07 DIAGNOSIS — I639 Cerebral infarction, unspecified: Secondary | ICD-10-CM

## 2016-08-07 LAB — SURGICAL PATHOLOGY

## 2016-08-08 NOTE — Progress Notes (Signed)
Carelink Summary Report / Loop Recorder 

## 2016-08-16 ENCOUNTER — Telehealth: Payer: Self-pay | Admitting: Cardiology

## 2016-08-16 NOTE — Telephone Encounter (Signed)
Spoke w/ pt and requested that she send a manual transmission b/c her home monitor has not updated in at least 14 days. Pt stated that she will get her husband to bring it to the facility she is at and reset it.

## 2016-08-17 LAB — CUP PACEART REMOTE DEVICE CHECK
Date Time Interrogation Session: 20171126190858
Implantable Pulse Generator Implant Date: 20161101

## 2016-08-24 ENCOUNTER — Encounter: Payer: Self-pay | Admitting: Cardiology

## 2016-09-06 ENCOUNTER — Encounter: Payer: Managed Care, Other (non HMO) | Admitting: *Deleted

## 2016-09-06 ENCOUNTER — Encounter: Payer: Self-pay | Admitting: Cardiology

## 2016-09-27 DIAGNOSIS — Z0271 Encounter for disability determination: Secondary | ICD-10-CM

## 2016-09-27 LAB — CUP PACEART REMOTE DEVICE CHECK
Implantable Pulse Generator Implant Date: 20161101
MDC IDC SESS DTM: 20171226193730

## 2016-09-27 NOTE — Progress Notes (Signed)
[facility] summary report received. Battery status OK. Normal device function. No new symptom episodes, tachy episodes, brady, or pause episodes. No new AF episodes. Monthly summary reports and ROV/PRN 

## 2016-10-08 ENCOUNTER — Ambulatory Visit (INDEPENDENT_AMBULATORY_CARE_PROVIDER_SITE_OTHER): Payer: Commercial Managed Care - PPO | Admitting: *Deleted

## 2016-10-08 DIAGNOSIS — I639 Cerebral infarction, unspecified: Secondary | ICD-10-CM

## 2016-10-09 NOTE — Progress Notes (Signed)
Carelink Summary Report / Loop Recorder 

## 2016-10-11 ENCOUNTER — Other Ambulatory Visit: Payer: Self-pay

## 2016-10-11 DIAGNOSIS — C50012 Malignant neoplasm of nipple and areola, left female breast: Secondary | ICD-10-CM

## 2016-10-24 LAB — CUP PACEART REMOTE DEVICE CHECK
MDC IDC PG IMPLANT DT: 20161101
MDC IDC SESS DTM: 20180224203928

## 2016-11-05 ENCOUNTER — Ambulatory Visit: Payer: Self-pay | Admitting: *Deleted

## 2016-11-17 LAB — CUP PACEART REMOTE DEVICE CHECK
Date Time Interrogation Session: 20180326204136
MDC IDC PG IMPLANT DT: 20161101

## 2016-11-20 ENCOUNTER — Encounter: Payer: Self-pay | Admitting: Internal Medicine

## 2016-12-05 ENCOUNTER — Ambulatory Visit (INDEPENDENT_AMBULATORY_CARE_PROVIDER_SITE_OTHER): Payer: Commercial Managed Care - PPO | Admitting: *Deleted

## 2016-12-05 DIAGNOSIS — I639 Cerebral infarction, unspecified: Secondary | ICD-10-CM | POA: Diagnosis not present

## 2016-12-07 NOTE — Progress Notes (Signed)
Carelink Summary Report / Loop Recorder 

## 2016-12-17 ENCOUNTER — Other Ambulatory Visit: Payer: Self-pay | Admitting: Internal Medicine

## 2016-12-26 LAB — CUP PACEART REMOTE DEVICE CHECK
Date Time Interrogation Session: 20180425213707
MDC IDC PG IMPLANT DT: 20161101

## 2017-01-04 ENCOUNTER — Ambulatory Visit (INDEPENDENT_AMBULATORY_CARE_PROVIDER_SITE_OTHER): Payer: Commercial Managed Care - PPO | Admitting: *Deleted

## 2017-01-04 ENCOUNTER — Ambulatory Visit
Admission: RE | Admit: 2017-01-04 | Discharge: 2017-01-04 | Disposition: A | Discharge status: Home / Self Care | Payer: BLUE CROSS/BLUE SHIELD | Source: Ambulatory Visit | Attending: General Surgery | Admitting: General Surgery

## 2017-01-04 DIAGNOSIS — I639 Cerebral infarction, unspecified: Secondary | ICD-10-CM | POA: Diagnosis not present

## 2017-01-04 DIAGNOSIS — C50012 Malignant neoplasm of nipple and areola, left female breast: Secondary | ICD-10-CM

## 2017-01-08 LAB — CUP PACEART REMOTE DEVICE CHECK
Date Time Interrogation Session: 20180525214137
MDC IDC PG IMPLANT DT: 20161101

## 2017-01-08 NOTE — Progress Notes (Signed)
Carelink Summary Report / Loop Recorder 

## 2017-01-15 ENCOUNTER — Ambulatory Visit (INDEPENDENT_AMBULATORY_CARE_PROVIDER_SITE_OTHER): Payer: BLUE CROSS/BLUE SHIELD | Admitting: Internal Medicine

## 2017-01-15 ENCOUNTER — Ambulatory Visit (INDEPENDENT_AMBULATORY_CARE_PROVIDER_SITE_OTHER): Payer: BLUE CROSS/BLUE SHIELD | Admitting: General Surgery

## 2017-01-15 ENCOUNTER — Encounter: Payer: Self-pay | Admitting: General Surgery

## 2017-01-15 ENCOUNTER — Encounter: Payer: Self-pay | Admitting: Internal Medicine

## 2017-01-15 VITALS — BP 122/74 | HR 100 | Resp 12 | Ht 62.0 in | Wt 136.0 lb

## 2017-01-15 VITALS — BP 148/80 | HR 77 | Ht 67.0 in | Wt 135.0 lb

## 2017-01-15 DIAGNOSIS — I639 Cerebral infarction, unspecified: Secondary | ICD-10-CM

## 2017-01-15 DIAGNOSIS — C50012 Malignant neoplasm of nipple and areola, left female breast: Secondary | ICD-10-CM

## 2017-01-15 NOTE — Progress Notes (Signed)
Patient ID: Tina Sharp, female   DOB: 1966/01/23, 51 y.o.   MRN: [id number]  Chief Complaint  Patient presents with  . Follow-up    mammogram    HPI Tina Sharp is a 51 y.o. female.  who presents for her breast cancer follow up and a breast evaluation. The most recent mammogram was done on .  Patient does perform regular self breast checks and gets regular mammograms done.  No new breast issues. Tolerating anastrozole.  She has improved from the CVA in Oct 2016. She is here today with her mother, Tina Sharp.   HPI  Past Medical History:  Diagnosis Date  . Anxiety   . Breast cancer ([place]) 01/2014   Invasive lobular carcinoma, 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 ER/ PR+; Her 2 neu not overexpressed, microscopic positive margin (skin). Oncotype DX, low risk for recurrence.  . Depression   . GERD (gastroesophageal reflux disease)   . Hypertension   . 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 ABDOMINAL HYSTERECTOMY  2000   left both ovaries in  . BREAST SURGERY Left 04/08/14   mastectomy  . COLONOSCOPY    . EP IMPLANTABLE DEVICE N/A 06/14/2015   Procedure: Loop Recorder Insertion;  Surgeon: Evans Lance, MD;  Location: [facility];  Service: Cardiovascular;  Laterality: N/A;  . HIP ARTHROPLASTY Right 08/01/2016   Procedure: ARTHROPLASTY BIPOLAR HIP (HEMIARTHROPLASTY);  Surgeon: Dereck Leep, MD;  Location: [facility] ORS;  Service: Orthopedics;  Laterality: Right;  . LAPAROSCOPIC HYSTERECTOMY  2010   [facility]  . MASTECTOMY Left 2016  . REDUCTION MAMMAPLASTY Right 2016   Lift  . reduction mammoplasty Right 04/08/14   Dr Tula Nakayama  . TEE WITHOUT CARDIOVERSION N/A 06/14/2015   Procedure: TRANSESOPHAGEAL ECHOCARDIOGRAM (TEE);  Surgeon: Josue Hector, MD;  Location: [facility] ENDOSCOPY;  Service: Cardiovascular;  Laterality: N/A;  . TONSILLECTOMY AND ADENOIDECTOMY     pt was 51 years  old    Family History  Problem Relation Age of Onset  . Breast cancer Mother 38  . Ovarian cancer Other   . Stroke Paternal Grandmother   . Breast cancer Maternal Grandmother 42  . Cancer Maternal Grandfather 23       prostate  . Prostate cancer Maternal Grandfather     Social History Social History  Substance Use Topics  . Smoking status: Former Smoker    Packs/day: 0.25    Types: Cigarettes    Quit date: 10/12/2015  . Smokeless tobacco: Never Used     Comment: E cig  . Alcohol use No     Comment: social    No Known Allergies  Current Outpatient Prescriptions  Medication Sig Dispense Refill  . anastrozole (ARIMIDEX) 1 MG tablet Take 1 tablet (1 mg total) by mouth every evening. 7 tablet 0  . atorvastatin (LIPITOR) 40 MG tablet Take 1 tablet (40 mg total) by mouth daily at 6 PM. 30 tablet 2  . b complex vitamins tablet Take 1 tablet by mouth daily. 30 tablet 0  . baclofen (LIORESAL) 10 MG tablet Take 10 mg by mouth 3 (three) times daily.    . clonazePAM (KLONOPIN) 0.5 MG disintegrating tablet Take 0.5 mg by mouth 2 (two) times daily.    . cyanocobalamin 1000 MCG tablet Take 1 tablet (1,000 mcg total) by mouth daily. 30 tablet 2  . DULoxetine (CYMBALTA) 60 MG capsule  Take 60 mg by mouth daily.    . folic acid (FOLVITE) 1 MG tablet Take 1 tablet (1 mg total) by mouth daily. 30 tablet 0  . gabapentin (NEURONTIN) 600 MG tablet Take 600 mg by mouth 4 (four) times daily.    Marland Kitchen omeprazole (PRILOSEC) 20 MG capsule Take 20 mg by mouth as needed (heart burn).     . prochlorperazine (COMPAZINE) 5 MG tablet Take 5 mg by mouth every 6 (six) hours as needed for nausea or vomiting.    . warfarin (COUMADIN) 5 MG tablet Take 2.5-5 mg by mouth daily. Take 2.5 mg for 3 days, then take 5 mg until returning for lab draw.     No current facility-administered medications for this visit.     Review of Systems Review of Systems  Blood pressure 122/74, pulse 100, resp. rate 12, height 5\' 2"   (1.575 m), weight 136 lb (61.7 kg).  Physical Exam Physical Exam  Constitutional: She is oriented to person, place, and time. She appears well-developed and well-nourished.  HENT:  Mouth/Throat: Oropharynx is clear and moist.  Eyes: Conjunctivae are normal. No scleral icterus.  Neck: Neck supple.  Cardiovascular: Normal rate, regular rhythm and normal heart sounds.   Pulmonary/Chest: Effort normal and breath sounds normal. Right breast exhibits no inverted nipple, no mass, no nipple discharge, no skin change and no tenderness. Left breast exhibits no inverted nipple, no mass, no nipple discharge, no skin change and no tenderness.    Bilateral breast incisions clean  Lymphadenopathy:    She has no cervical adenopathy.    She has no axillary adenopathy.       Left: No supraclavicular adenopathy present.  Neurological: She is alert and oriented to person, place, and time.  Skin: Skin is warm and dry.  Psychiatric: Her behavior is normal.    Data Reviewed 01/04/2017 right breast mammogram completed for an area of microcalcification showed finding consistent with fat necrosis. BI-RADS-2. Annual screening mammogram recommended.  Assessment    No evidence of recurrent breast cancer on the left side.  Stable right breast microcalcifications.   Continued strong recovery from her CVA.    Plan    The patient has not been followed with medical oncology since Dr. Ma Hillock left. We'll have her continue her present Aromasin to complete at least 5 years of therapy.     Follow up in November 2018, office visit only.   The patient will continue on her antiestrogen therapy.  HPI, Physical Exam, Assessment and Plan have been scribed under the direction and in the presence of Robert Bellow, MD.  Karie Fetch, RN   Robert Bellow 01/15/2017, 7:03 PM

## 2017-01-15 NOTE — Progress Notes (Signed)
Patient Care Team: Kirk Ruths, MD as PCP - General (Internal Medicine) Bary Castilla, Forest Gleason, MD (General Surgery) Chriss Czar, MD as Referring Physician (Family Medicine)   HPI  Tina Sharp is a 51 y.o. female Seen following loop recorder implantation for cryptogenic stroke 10/16. This was put in by Dr. Elliot Cousin. Echocardiogram at that time was normal.  The patient is on warfarin. There've been no bleeding issues. Reviewing the old records she had presented to hospital 1 week after her initial stroke and was found to have on MRA intraluminal thrombus prompting the initiation of warfarin. No atrial fibrillation has ever been detected on her device. She was then hospitalized again 3/17 with multiple MRI abnormalities not to be explaining of her bilateral symptoms. Metabolic issues suggestive EtOH were raised.  Records and Results Reviewed CBC and chem profile were normal at [facility] 3/18  Past Medical History:  Diagnosis Date  . Anxiety   . 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 ER/ PR+; Her 2 neu not overexpressed, microscopic positive margin (skin).  . Depression   . GERD (gastroesophageal reflux disease)   . Hypertension   . 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 ABDOMINAL HYSTERECTOMY  2000   left both ovaries in  . BREAST SURGERY Left 04/08/14   mastectomy  . COLONOSCOPY    . EP IMPLANTABLE DEVICE N/A 06/14/2015   Procedure: Loop Recorder Insertion;  Surgeon: Evans Lance, MD;  Location: [facility];  Service: Cardiovascular;  Laterality: N/A;  . HIP ARTHROPLASTY Right 08/01/2016   Procedure: ARTHROPLASTY BIPOLAR HIP (HEMIARTHROPLASTY);  Surgeon: Dereck Leep, MD;  Location: [facility] ORS;  Service: Orthopedics;  Laterality: Right;  . LAPAROSCOPIC HYSTERECTOMY  2010   [facility]  . MASTECTOMY Left 2016  .  REDUCTION MAMMAPLASTY Right 2016   Lift  . reduction mammoplasty Right 04/08/14   Dr Tula Nakayama  . TEE WITHOUT CARDIOVERSION N/A 06/14/2015   Procedure: TRANSESOPHAGEAL ECHOCARDIOGRAM (TEE);  Surgeon: Josue Hector, MD;  Location[facility] END[facility]COPY;  Service: Cardiovascular;  Laterality: N/A;  . TONSILLECTOMY AND ADENOIDECTOMY     pt was 51 years old    Current Outpatient Prescriptions  Medication Sig Dispense Refill  . anastrozole (ARIMIDEX) 1 MG tablet Take 1 tablet (1 mg total) by mouth every evening. 7 tablet 0  . atorvastatin (LIPITOR) 40 MG tablet Take 1 tablet (40 mg total) by mouth daily at 6 PM. 30 tablet 2  . b complex vitamins tablet Take 1 tablet by mouth daily. 30 tablet 0  . baclofen (LIORESAL) 10 MG tablet Take 10 mg by mouth 3 (three) times daily.    . clonazePAM (KLONOPIN) 0.5 MG disintegrating tablet Take 0.5 mg by mouth 2 (two) times daily.    . cyanocobalamin 1000 MCG tablet Take 1 tablet (1,000 mcg total) by mouth daily. 30 tablet 2  . DULoxetine (CYMBALTA) 60 MG capsule Take 60 mg by mouth daily.    Marland Kitchen enoxaparin (LOVENOX) 40 MG/0.4ML injection Inject 0.4 mLs (40 mg total) into the skin daily. 14 Syringe 0  . folic acid (FOLVITE) 1 MG tablet Take 1 tablet (1 mg total) by mouth daily. 30 tablet 0  . gabapentin (NEURONTIN) 600 MG tablet Take 600 mg by mouth 4 (four) times daily.    Marland Kitchen omeprazole (PRILOSEC) 20 MG capsule Take  20 mg by mouth as needed (heart burn).     . prochlorperazine (COMPAZINE) 5 MG tablet Take 5 mg by mouth every 6 (six) hours as needed for nausea or vomiting.    . warfarin (COUMADIN) 5 MG tablet Take 2.5-5 mg by mouth daily. Take 2.5 mg for 3 days, then take 5 mg until returning for lab draw.     No current facility-administered medications for this visit.     No Known Allergies    Review of Systems negative except from HPI and PMH  Physical Exam BP (!) 148/80 (BP Location: Right Arm, Patient Position: Sitting, Cuff Size: Normal)   Pulse 77   Ht 5\' 7"   (1.702 m)   Wt 135 lb (61.2 kg)   BMI 21.14 kg/m  Well developed and nourished in no acute distress HENT normal Neck supple w at Clear Regular rate and rhythm, no murmurs or gallops Abd-soft with active BS No Clubbing cyanosis edema Skin-warm and dry A & Oriented  Grossly normal sensory and motor function  ECG demonstrates sinus rhythm at 77 Intervals 07/20/38  Assessment and  Plan  Cryptogenic stroke  Implantable loop recorder   PVCs    The patient's chart was reviewed. She is artery on warfarin. We have discussed ongoing options including continuing monitoring or explantation of the device. She would like the latter. She has a history of breast cancer on that side.   she has PVCs. There is been no interval assessment of LV systolic function. She has no functional limitations related to cardiorespiratory symptoms, mostly residual from her stroke.       Current medicines are reviewed at length with the patient today .  The patient does not  have concerns regarding medicines.

## 2017-01-15 NOTE — Patient Instructions (Signed)
Follow up in November 2018, office visit only.

## 2017-01-15 NOTE — Patient Instructions (Signed)
Medication Instructions: - Your physician recommends that you continue on your current medications as directed. Please refer to the Current Medication list given to you today.  Labwork: - none ordered  Procedures/Testing: - none ordered  Follow-Up: - please call Dr. Olin Pia nurse, Nira Conn, if you decide you want to have you loop recorder taken out [phone].  Any Additional Special Instructions Will Be Listed Below (If Applicable).     If you need a refill on your cardiac medications before your next appointment, please call your pharmacy.

## 2017-01-25 ENCOUNTER — Other Ambulatory Visit: Payer: Self-pay

## 2017-02-03 LAB — CUP PACEART INCLINIC DEVICE CHECK
Date Time Interrogation Session: 20180605162932
Implantable Pulse Generator Implant Date: 20161101

## 2017-02-07 IMAGING — MR MR MRA HEAD W/O CM
9 of 10 series · 33 of 48 positions shown · non-contrast
Comparison: CT head 06/11/2015.

CLINICAL DATA: Patient developed acute onset of LEFT hand numbness
and weakness 06/11/2015 at the same time she developed a severe
RIGHT-sided headache. History of breast cancer. Current smoker. TPA
administered. Initial encounter.

EXAM:
MRI HEAD WITHOUT CONTRAST
MRA HEAD WITHOUT CONTRAST
TECHNIQUE: Multiplanar, multiecho pulse sequences of the brain and surrounding
structures were obtained without intravenous contrast. Angiographic
images of the head were obtained using MRA technique without
contrast.

[Series 3: DWI · axial · 3.0mm · 1.09mm/px · z∈[-41,+100]mm · 9 of 96 slices shown (1 of 4)]
[im 1/96]
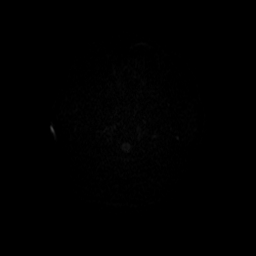
[im 12/96]
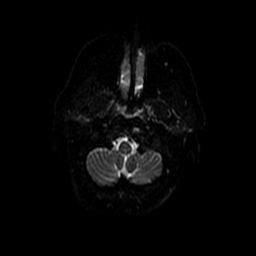
[im 24/96]
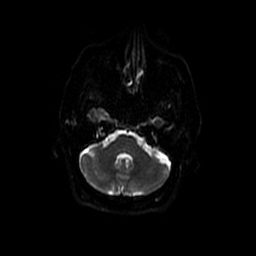
[im 36/96]
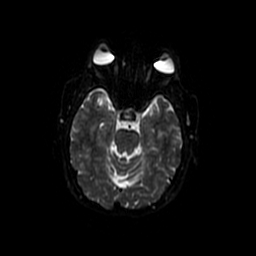
[im 48/96]
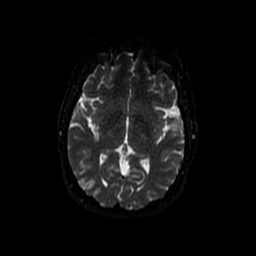
[im 60/96]
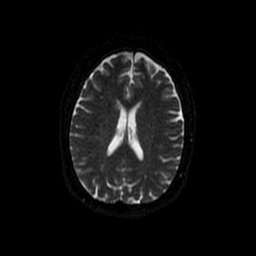
[im 72/96]
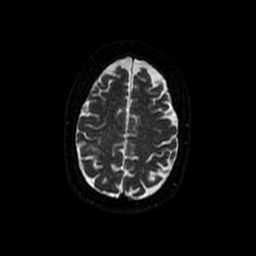
[im 84/96]
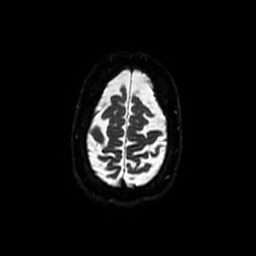
[im 96/96]
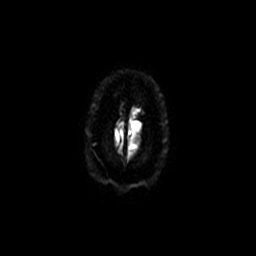

[Series 4: DWI · coronal · 5.0mm · 1.09mm/px · 6 of 66 slices shown (2 of 4)]
[im 1/66]
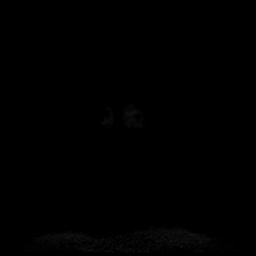
[im 14/66]
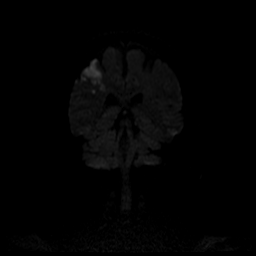
[im 27/66]
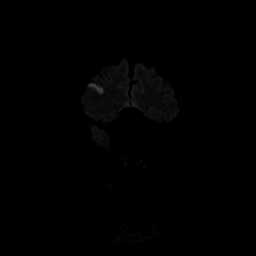
[im 40/66]
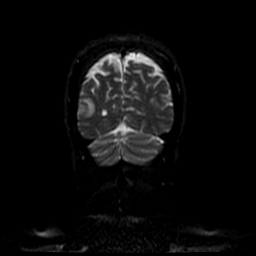
[im 53/66]
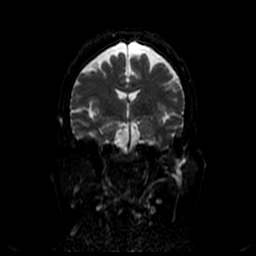
[im 66/66]
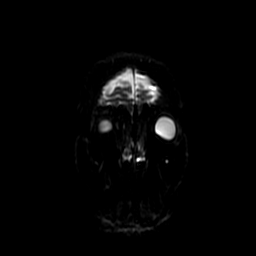

[Series 5: T2 · axial · 5.0mm · 0.43mm/px · z∈[-42,+96]mm · 2 of 24 slices shown (1 of 2)]
[im 1/24]
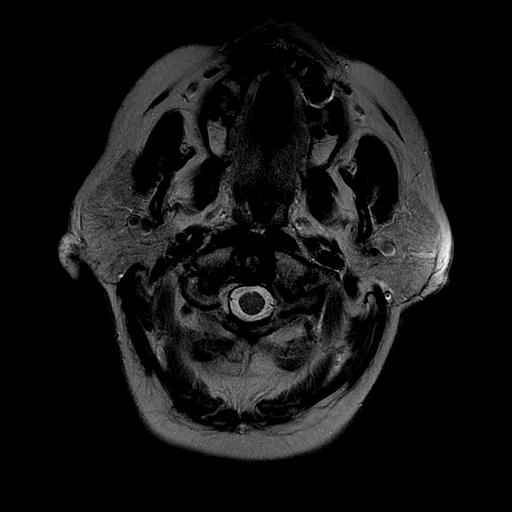
[im 24/24]
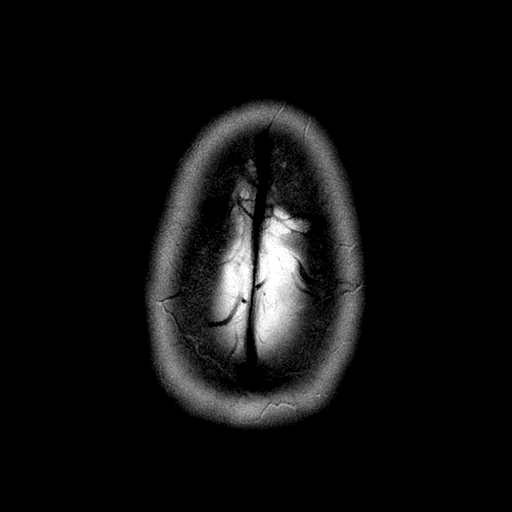

[Series 6: FLAIR · axial · 5.0mm · 0.43mm/px · z∈[-42,+96]mm · 2 of 24 slices shown]
[im 1/24]
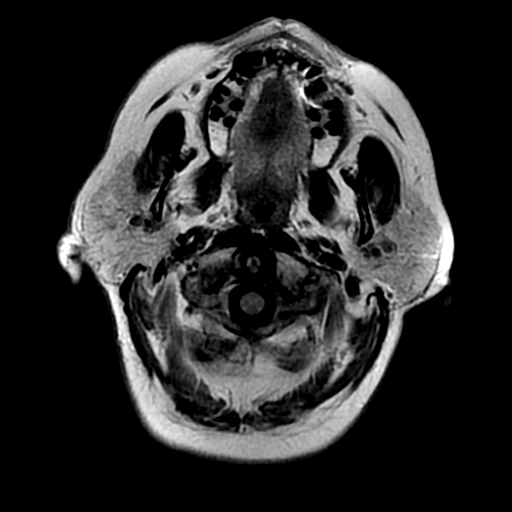
[im 24/24]
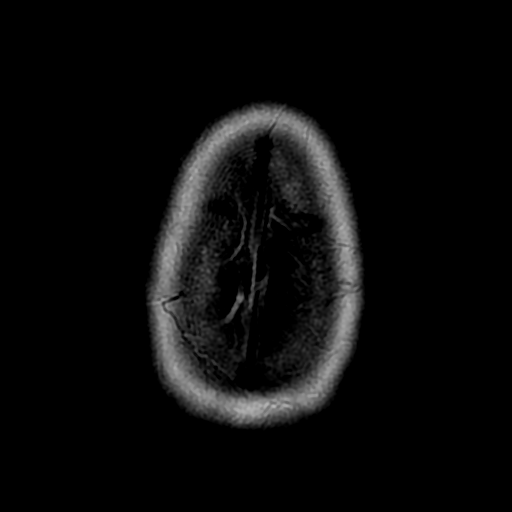

[Series 7: (id) mt fs · axial · 1.2mm · 0.43mm/px · z∈[-41,-26]mm · 2 of 174 slices shown]
[im 1/174]
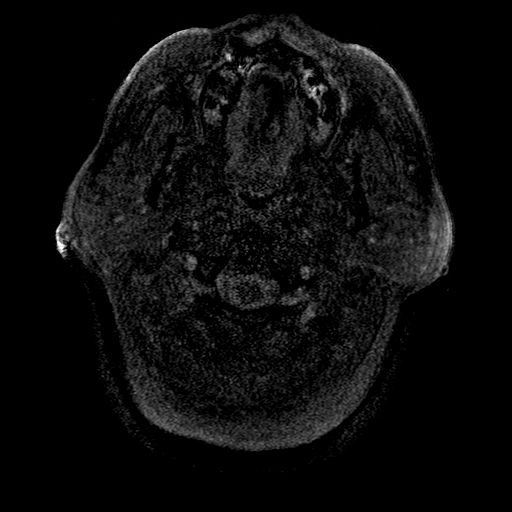
[im 25/174]
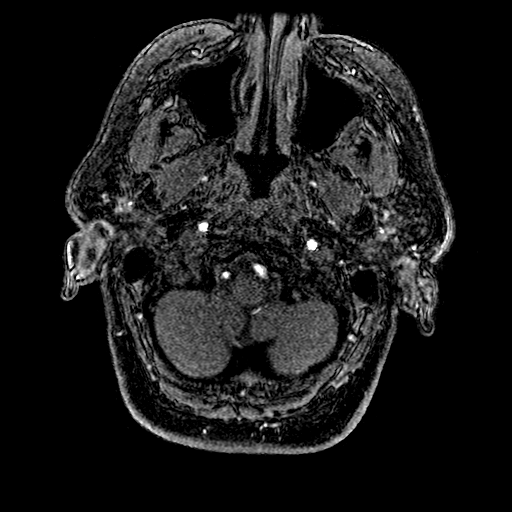

[Series 8: T2 · coronal · 5.0mm · 0.43mm/px · 3 of 29 slices shown (2 of 2)]
[im 1/29]
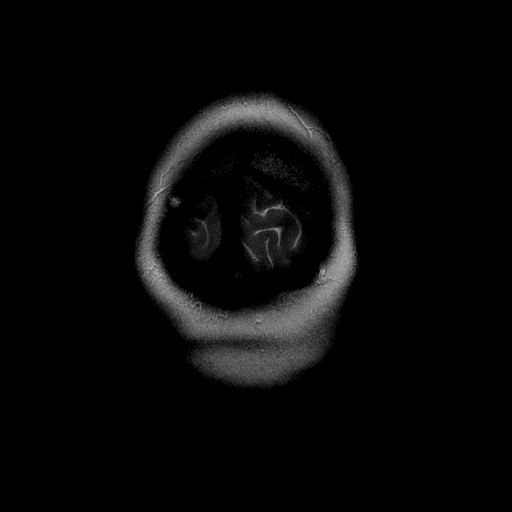
[im 15/29]
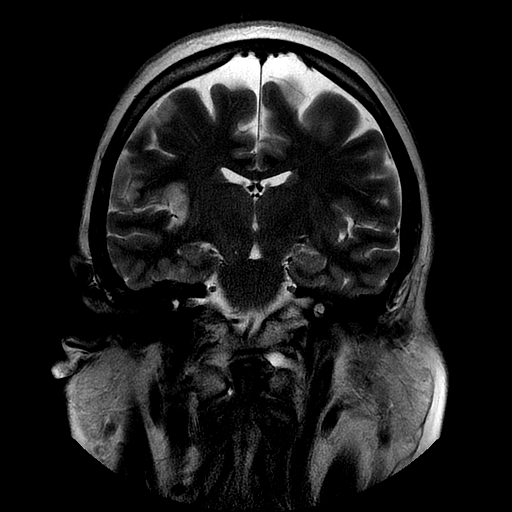
[im 29/29]
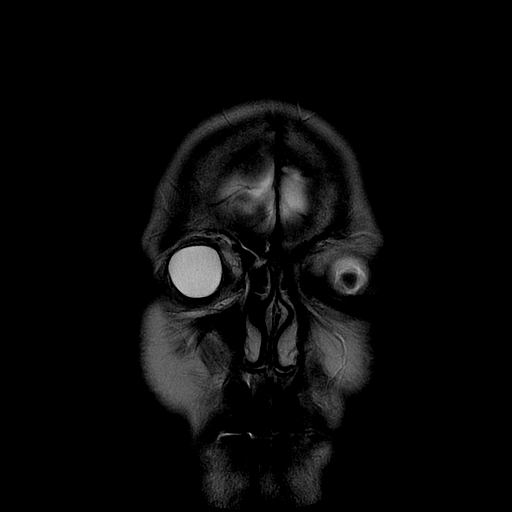

[Series 9: T1 · sagittal · 5.0mm · 0.47mm/px · 2 of 23 slices shown]
[im 1/23]
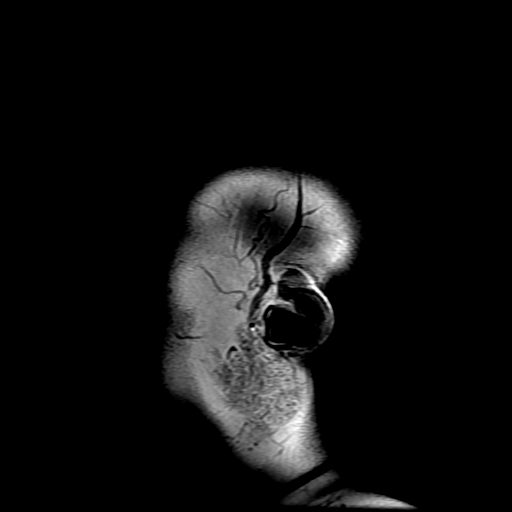
[im 23/23]
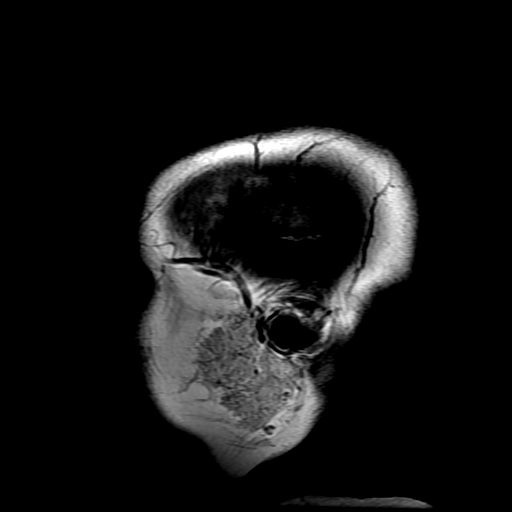

[Series 300: DWI · axial · 3.0mm · 1.09mm/px · z∈[-41,+100]mm · 4 of 48 slices shown (3 of 4)]
[im 1/48]
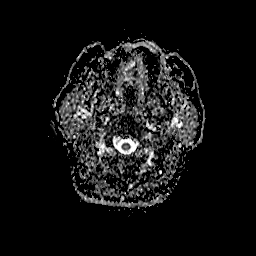
[im 16/48]
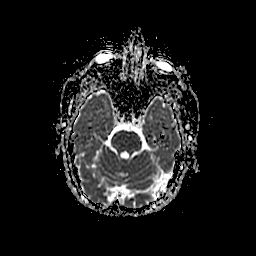
[im 32/48]
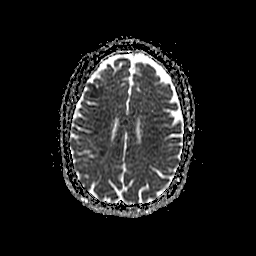
[im 48/48]
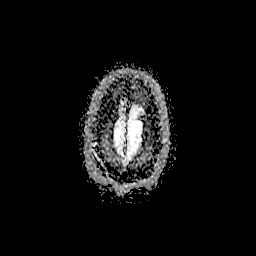

[Series 400: DWI · coronal · 5.0mm · 1.09mm/px · 3 of 33 slices shown (4 of 4)]
[im 1/33]
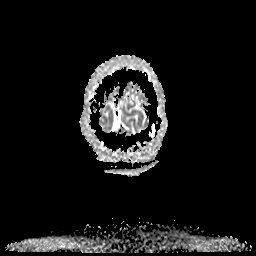
[im 17/33]
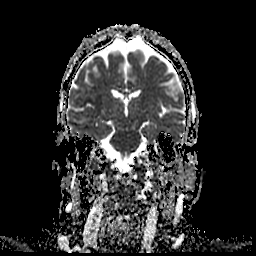
[im 33/33]
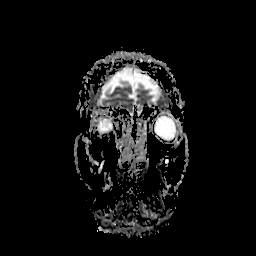

[33 of 48 positions shown; findings below may reference images not displayed]

FINDINGS: MRI HEAD FINDINGS

Multifocal areas of acute infarction throughout primarily the RIGHT
MCA territory involving the frontal, parietal, temporal, and insular
regions. These are predominantly cortical rather than subcortical.
Small area of RIGHT caudate involvement. No LEFT hemisphere
involvement, brainstem or cerebellar involvement. Some areas of
cortical cytotoxic edema efface the sulci, but there is no
subarachnoid blood seen on FLAIR imaging.

No hemorrhage, mass lesion, or extra-axial fluid.

Slight atrophy without hydrocephalus. No significant white matter
disease. No foci of chronic hemorrhage. Flow voids are maintained.

No sinus or mastoid disease.  Normal midline structures.

Within limits for detection on noncontrast MR, no visible
intracranial metastatic disease.

Compared with prior CT, there is no hemorrhage and no visible
cytotoxic edema on that study.

MRA HEAD FINDINGS

The internal carotid arteries are dolichoectatic but widely patent.
The basilar artery is widely patent with both vertebrals
contributing equally. Fetal origin RIGHT PCA.

No proximal stenosis of the middle or posterior cerebral arteries.
Hypoplastic and/or diffusely diseased RIGHT A1 ACA of no
significance given the robust and dominant LEFT A1 ACA.

The RIGHT M3 and M4 branches of the middle cerebral artery appear
more irregular and beaded than those on the LEFT. In the appropriate
clinical setting, this could represent manifestation of reversible
cerebral vasoconstriction syndrome (RCVS).

No intracranial aneurysm.
IMPRESSION: Multifocal areas of nonhemorrhagic acute infarction throughout the
RIGHT hemisphere, predominantly cortical involvement. No proximal
flow reducing lesion is observed on MRA, but there is subtle
irregularity and beading of the distal RIGHT MCA branches as
compared to the LEFT.

In the setting of acute headache associated with cerebral
infarction, the findings are suggestive of reversible cerebral
vasoconstriction syndrome (RCVS). Multiple emboli are less favored.

No evidence for post tPA complication.

## 2017-05-18 ENCOUNTER — Other Ambulatory Visit
Admission: RE | Admit: 2017-05-18 | Discharge: 2017-05-18 | Disposition: A | Discharge status: Home / Self Care | Payer: BLUE CROSS/BLUE SHIELD | Source: Ambulatory Visit | Attending: Internal Medicine | Admitting: Internal Medicine

## 2017-05-18 DIAGNOSIS — R791 Abnormal coagulation profile: Secondary | ICD-10-CM | POA: Diagnosis not present

## 2017-05-18 LAB — PROTIME-INR
INR: 2.21
Prothrombin Time: 24.3 seconds — ABNORMAL HIGH (ref 11.4–15.2)

## 2017-06-24 ENCOUNTER — Inpatient Hospital Stay: Payer: Self-pay

## 2017-06-24 ENCOUNTER — Ambulatory Visit: Payer: BLUE CROSS/BLUE SHIELD | Admitting: General Surgery

## 2017-06-24 ENCOUNTER — Encounter: Payer: Self-pay | Admitting: General Surgery

## 2017-06-24 VITALS — BP 132/70 | HR 92 | Resp 13 | Ht 62.0 in | Wt 134.0 lb

## 2017-06-24 DIAGNOSIS — R921 Mammographic calcification found on diagnostic imaging of breast: Secondary | ICD-10-CM | POA: Diagnosis not present

## 2017-06-24 DIAGNOSIS — Z170 Estrogen receptor positive status [ER+]: Secondary | ICD-10-CM

## 2017-06-24 DIAGNOSIS — C50012 Malignant neoplasm of nipple and areola, left female breast: Secondary | ICD-10-CM | POA: Diagnosis not present

## 2017-06-24 NOTE — Progress Notes (Signed)
Patient ID: Tina Sharp, female   DOB: [birth year], 51 y.o.   MRN: [id number]  Chief Complaint  Patient presents with  . Breast Cancer    HPI Tina Sharp is a 51 y.o. female here today for her follow up breast cancer. Patient states she is doing well and no problems with the use of Anastrozole.  HPI  Past Medical History:  Diagnosis Date  . Anxiety   . Breast cancer ([place]) 01/2014   Invasive lobular carcinoma, 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 ER/ PR+; Her 2 neu not overexpressed, microscopic positive margin (skin). Oncotype DX, low risk for recurrence.  . Depression   . GERD (gastroesophageal reflux disease)   . Hypertension   . 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 ABDOMINAL HYSTERECTOMY  2000   left both ovaries in  . BREAST SURGERY Left 04/08/14   mastectomy  . COLONOSCOPY    . LAPAROSCOPIC HYSTERECTOMY  2010   [facility]  . MASTECTOMY Left 2016  . REDUCTION MAMMAPLASTY Right 2016   Lift  . reduction mammoplasty Right 04/08/14   Dr Tula Nakayama  . TONSILLECTOMY AND ADENOIDECTOMY     pt was 51 years old    Family History  Problem Relation Age of Onset  . Breast cancer Mother 27  . Ovarian cancer Other   . Stroke Paternal Grandmother   . Breast cancer Maternal Grandmother 38  . Cancer Maternal Grandfather 67       prostate  . Prostate cancer Maternal Grandfather     Social History Social History   Tobacco Use  . Smoking status: Former Smoker    Packs/day: 0.25    Types: Cigarettes    Last attempt to quit: 10/12/2015    Years since quitting: 1.7  . Smokeless tobacco: Never Used  . Tobacco comment: E cig  Substance Use Topics  . Alcohol use: No    Alcohol/week: 1.8 oz    Types: 1 Glasses of wine, 1 Cans of beer, 1 Shots of liquor per week    Comment: social  . Drug use: No    No Known Allergies  Current Outpatient Medications  Medication Sig Dispense  Refill  . anastrozole (ARIMIDEX) 1 MG tablet Take 1 tablet (1 mg total) by mouth every evening. 7 tablet 0  . atorvastatin (LIPITOR) 40 MG tablet Take 1 tablet (40 mg total) by mouth daily at 6 PM. 30 tablet 2  . b complex vitamins tablet Take 1 tablet by mouth daily. 30 tablet 0  . baclofen (LIORESAL) 10 MG tablet Take 10 mg by mouth 3 (three) times daily.    . clonazePAM (KLONOPIN) 0.5 MG disintegrating tablet Take 0.5 mg by mouth 2 (two) times daily.    . cyanocobalamin 1000 MCG tablet Take 1 tablet (1,000 mcg total) by mouth daily. 30 tablet 2  . DULoxetine (CYMBALTA) 60 MG capsule Take 60 mg by mouth daily.    . folic acid (FOLVITE) 1 MG tablet Take 1 tablet (1 mg total) by mouth daily. 30 tablet 0  . gabapentin (NEURONTIN) 600 MG tablet Take 600 mg by mouth 4 (four) times daily.    Marland Kitchen omeprazole (PRILOSEC) 20 MG capsule Take 20 mg by mouth as needed (heart burn).     . prochlorperazine (COMPAZINE) 5 MG tablet Take 5 mg by mouth every 6 (six) hours as needed for nausea or vomiting.    Marland Kitchen  warfarin (COUMADIN) 5 MG tablet Take 2.5-5 mg by mouth daily. Take 2.5 mg for 3 days, then take 5 mg until returning for lab draw.     No current facility-administered medications for this visit.     Review of Systems Review of Systems  Constitutional: Negative.   Respiratory: Negative.   Cardiovascular: Negative.     Blood pressure 132/70, pulse 92, resp. rate 13, height 5\' 2"  (1.575 m), weight 134 lb (60.8 kg).  Physical Exam Physical Exam  Constitutional: She is oriented to person, place, and time. She appears well-developed and well-nourished.  Eyes: Conjunctivae are normal. No scleral icterus.  Neck: Neck supple.  Cardiovascular: Normal rate, regular rhythm and normal heart sounds.  Pulmonary/Chest: Effort normal and breath sounds normal. Right breast exhibits no inverted nipple, no mass, no nipple discharge, no skin change and no tenderness.    Reconstructed left breast looks good and is  well healed.  Lymphadenopathy:    She has no cervical adenopathy.    She has no axillary adenopathy.  Neurological: She is alert and oriented to person, place, and time.  Skin: Skin is warm and dry.  Psychiatric: She has a normal mood and affect.    Data Reviewed Jan 04, 2017 right breast screening mammogram is reviewed.  Dense calcifications in the upper outer quadrant.  No interval change.  BI-RADS-2.  Ultrasound examination of the lower outer quadrant of the right breast was completed due to the vague fullness appreciated on exam, new from.  June of this year.  Architectural distortion from her previous surgery without evidence of dominant mass or thickening is noted.  No cystic or solid lesions.    Focal prominence is much less appreciated with ultrasound jelly on the examining fingers.BI-RADS-2.   Assessment    Benign breast exam.  Candidate for screening colonoscopy.    Plan    The patient reports her last colonoscopy was "years ago".  With her breast surgery in 2015, stroke in 2016 and hip fracture in 2017, she is going to put things on hold until discussion in spring.  If she recalls who did her last exam we can make a referral to that practice otherwise she is welcome to have a completed through this practice.  Follow up in May with Unilateral Right screening mammogram and office visit. Will discuss colonoscopy at next visit.      The patient is concerned about the progressive ptosis of the right breast as she has gained weight.  She has been encouraged to call Dr. Edwin Dada office and see if this might be covered under the symmetry coverage from her previous cancer.  Certainly worth a phone call. HPI, Physical Exam, Assessment and Plan have been scribed under the direction and in the presence of Robert Bellow, MD  Concepcion Living, LPN  I have completed the exam and reviewed the above documentation for accuracy and completeness.  I agree with the above.  Development worker, community has been used and any errors in dictation or transcription are unintentional.  Hervey Ard, M.D., F.A.C.S.   Robert Bellow 06/24/2017, 4:01 PM

## 2017-06-24 NOTE — Patient Instructions (Signed)
May consult with plastic surgery to see if breast augmentation covered by insurance.  Follow up in

## 2017-09-26 ENCOUNTER — Encounter: Payer: Self-pay | Admitting: *Deleted

## 2017-10-31 ENCOUNTER — Other Ambulatory Visit: Payer: Self-pay

## 2017-10-31 DIAGNOSIS — Z1231 Encounter for screening mammogram for malignant neoplasm of breast: Secondary | ICD-10-CM

## 2018-01-14 ENCOUNTER — Encounter (INDEPENDENT_AMBULATORY_CARE_PROVIDER_SITE_OTHER): Payer: Self-pay | Admitting: Vascular Surgery

## 2018-01-14 ENCOUNTER — Ambulatory Visit (INDEPENDENT_AMBULATORY_CARE_PROVIDER_SITE_OTHER): Payer: BLUE CROSS/BLUE SHIELD | Admitting: Vascular Surgery

## 2018-01-14 ENCOUNTER — Ambulatory Visit: Payer: BLUE CROSS/BLUE SHIELD | Admitting: General Surgery

## 2018-01-14 VITALS — BP 144/99 | HR 98 | Resp 16 | Ht 62.0 in | Wt 160.0 lb

## 2018-01-14 DIAGNOSIS — M7989 Other specified soft tissue disorders: Secondary | ICD-10-CM

## 2018-01-14 DIAGNOSIS — E785 Hyperlipidemia, unspecified: Secondary | ICD-10-CM | POA: Diagnosis not present

## 2018-01-14 DIAGNOSIS — I10 Essential (primary) hypertension: Secondary | ICD-10-CM | POA: Diagnosis not present

## 2018-01-14 NOTE — Patient Instructions (Signed)

## 2018-01-14 NOTE — Assessment & Plan Note (Signed)
lipid control important in reducing the progression of atherosclerotic disease. Continue statin therapy  

## 2018-01-14 NOTE — Assessment & Plan Note (Signed)
blood pressure control important in reducing the progression of atherosclerotic disease. On appropriate oral medications.  

## 2018-01-14 NOTE — Progress Notes (Signed)
Patient ID: Tina Sharp, female   DOB: 04-Feb-1966, 52 y.o.   MRN: [id number]  Chief Complaint  Patient presents with  . New Patient (Initial Visit)    ref Melrose Nakayama for discoloration    HPI Tina Sharp is a 52 y.o. female.  I am asked to see the patient by Dr. Melrose Nakayama for evaluation of lower extremity coolness and discoloration.  The patient reports several months of purplish discoloration to her lower legs.  This is associated with some mild swelling. She has a long history of neuropathy.  This is not terribly painful.  There was no clear inciting event or causative factor that started the symptoms.  They have been steadily progressive.  Nothing is really made a much better.  No fevers or chills.  No chest pain or shortness of breath.   Past Medical History:  Diagnosis Date  . Anxiety   . Breast cancer ([place]) 01/2014   Invasive lobular carcinoma, 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 ER/ PR+; Her 2 neu not overexpressed, microscopic positive margin (skin). Oncotype DX, low risk for recurrence.  . Depression   . Genetic screening 05/05/2014   negative /[facility]  . GERD (gastroesophageal reflux disease)   . Hypertension   . 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 ABDOMINAL HYSTERECTOMY  2000   left both ovaries in  . BREAST SURGERY Left 04/08/14   mastectomy  . COLONOSCOPY    . EP IMPLANTABLE DEVICE N/A 06/14/2015   Procedure: Loop Recorder Insertion;  Surgeon: Evans Lance, MD;  Location: [facility];  Service: Cardiovascular;  Laterality: N/A;  . HIP ARTHROPLASTY Right 08/01/2016   Procedure: ARTHROPLASTY BIPOLAR HIP (HEMIARTHROPLASTY);  Surgeon: Dereck Leep, MD;  Location: [facility];  Service: Orthopedics;  Laterality: Right;  . LAPAROSCOPIC HYSTERECTOMY  2010   [facility]  . MASTECTOMY Left 2016  . REDUCTION MAMMAPLASTY Right 2016   Lift  . reduction mammoplasty  Right 04/08/14   Dr Tula Nakayama  . TEE WITHOUT CARDIOVERSION N/A 06/14/2015   Procedure: TRANSESOPHAGEAL ECHOCARDIOGRAM (TEE);  Surgeon: Josue Hector, MD;  Location: [facility]OSCOPY;  Service: Cardiovascular;  Laterality: N/A;  . TONSILLECTOMY AND ADENOIDECTOMY     pt was 52 years old    Family History  Problem Relation Age of Onset  . Breast cancer Mother 19  . Ovarian cancer Other   . Stroke Paternal Grandmother   . Breast cancer Maternal Grandmother 94  . Cancer Maternal Grandfather 54       prostate  . Prostate cancer Maternal Grandfather      Social History Social History   Tobacco Use  . Smoking status: Former Smoker    Packs/day: 0.25    Types: Cigarettes    Last attempt to quit: 10/12/2015    Years since quitting: 2.2  . Smokeless tobacco: Never Used  . Tobacco comment: E cig  Substance Use Topics  . Alcohol use: No    Alcohol/week: 1.8 oz    Types: 1 Glasses of wine, 1 Cans of beer, 1 Shots of liquor per week    Comment: social  . Drug use: No     No Known Allergies  Current Outpatient Medications  Medication Sig Dispense Refill  . anastrozole (ARIMIDEX) 1 MG tablet Take 1 tablet (1 mg total) by mouth every evening. 7 tablet 0  . aspirin EC 81 MG tablet Take  by mouth.    Marland Kitchen atorvastatin (LIPITOR) 40 MG tablet Take 1 tablet (40 mg total) by mouth daily at 6 PM. 30 tablet 2  . b complex vitamins tablet Take 1 tablet by mouth daily. 30 tablet 0  . baclofen (LIORESAL) 10 MG tablet Take 10 mg by mouth 3 (three) times daily.    . clonazePAM (KLONOPIN) 0.5 MG disintegrating tablet Take 0.5 mg by mouth 2 (two) times daily.    . cyanocobalamin 1000 MCG tablet Take 1 tablet (1,000 mcg total) by mouth daily. 30 tablet 2  . DULoxetine (CYMBALTA) 60 MG capsule Take 60 mg by mouth daily.    . folic acid (FOLVITE) 1 MG tablet Take 1 tablet (1 mg total) by mouth daily. 30 tablet 0  . gabapentin (NEURONTIN) 600 MG tablet Take 600 mg by mouth 4 (four) times daily.    Marland Kitchen omeprazole  (PRILOSEC) 20 MG capsule Take 20 mg by mouth as needed (heart burn).     . potassium chloride (K-DUR) 10 MEQ tablet Take by mouth.    . prochlorperazine (COMPAZINE) 5 MG tablet Take 5 mg by mouth every 6 (six) hours as needed for nausea or vomiting.    . pyridOXINE (B-6) 50 MG tablet Take by mouth.    . torsemide (DEMADEX) 20 MG tablet Take by mouth.    . warfarin (COUMADIN) 5 MG tablet Take 2.5-5 mg by mouth daily. Take 2.5 mg for 3 days, then take 5 mg until returning for lab draw.     No current facility-administered medications for this visit.       REVIEW OF SYSTEMS (Negative unless checked)  Constitutional: [] Weight loss  [] Fever  [] Chills Cardiac: [] Chest pain   [] Chest pressure   [] Palpitations   [] Shortness of breath when laying flat   [] Shortness of breath at rest   [] Shortness of breath with exertion. Vascular:  [] Pain in legs with walking   [] Pain in legs at rest   [] Pain in legs when laying flat   [] Claudication   [] Pain in feet when walking  [] Pain in feet at rest  [] Pain in feet when laying flat   [] History of DVT   [] Phlebitis   [x] Swelling in legs   [] Varicose veins   [] Non-healing ulcers Pulmonary:   [] Uses home oxygen   [] Productive cough   [] Hemoptysis   [] Wheeze  [] COPD   [] Asthma Neurologic:  [] Dizziness  [] Blackouts   [] Seizures   [x] History of stroke   [] History of TIA  [] Aphasia   [] Temporary blindness   [] Dysphagia   [] Weakness or numbness in arms   [x] Weakness or numbness in legs Musculoskeletal:  [x] Arthritis   [] Joint swelling   [x] Joint pain   [] Low back pain Hematologic:  [] Easy bruising  [] Easy bleeding   [] Hypercoagulable state   [x] Anemic  [] Hepatitis Gastrointestinal:  [] Blood in stool   [] Vomiting blood  [x] Gastroesophageal reflux/heartburn   [] Abdominal pain Genitourinary:  [] Chronic kidney disease   [] Difficult urination  [] Frequent urination  [] Burning with urination   [] Hematuria Skin:  [] Rashes   [] Ulcers   [] Wounds Psychological:  [] History of anxiety    []  History of major depression.    Physical Exam BP (!) 144/99 (BP Location: Right Arm)   Pulse 98   Resp 16   Ht 5\' 2"  (1.575 m)   Wt 160 lb (72.6 kg)   BMI 29.26 kg/m  Gen:  WD/WN, NAD Head: [place]/AT, No temporalis wasting.  Ear/Nose/Throat: Hearing grossly intact, nares w/o erythema or drainage, oropharynx w/o Erythema/Exudate  Eyes: Conjunctiva clear, sclera non-icteric  Neck: trachea midline.  No  JVD.  Pulmonary:  Good air movement, respirations not labored, no use of accessory muslces Cardiac: RRR Vascular:  Vessel Right Left  Radial Palpable Palpable                          PT 1+ Palpable 1+ Palpable  DP Palpable Palpable   Gastrointestinal: soft, non-tender/non-distended Musculoskeletal: M/S 5/5 throughout.  Purplish discoloration of both lower extremities consistent with venous congestion.  Mildly cool to the touch.  No deformity or atrophy.  1+ bilateral lower extremity edema. Neurologic: Sensation grossly intact in extremities.  Symmetrical.  Speech is fluent. Motor exam as listed above. Psychiatric: Judgment intact, Mood & affect appropriate for pt's clinical situation. Dermatologic: No rashes or ulcers noted.  No cellulitis or open wounds.  Radiology No results found.  Labs No results found for this or any previous visit (from the past 2160 hour(s)).  Assessment/Plan:  HLD (hyperlipidemia) lipid control important in reducing the progression of atherosclerotic disease. Continue statin therapy   Hypertension blood pressure control important in reducing the progression of atherosclerotic disease. On appropriate oral medications.   Leg swelling I have had a long discussion with the patient regarding swelling and why it  causes symptoms.  Patient will begin wearing graduated compression stockings class 1 (20-30 mmHg) on a daily basis a prescription was given. The patient will  beginning wearing the stockings first thing in the morning and removing them in  the evening. The patient is instructed specifically not to sleep in the stockings.   In addition, behavioral modification will be initiated.  This will include frequent elevation, use of over the counter pain medications and exercise such as walking.  I have reviewed systemic causes for chronic edema such as liver, kidney and cardiac etiologies.  The patient denies problems with these organ systems.    Consideration for a lymph pump will also be made based upon the effectiveness of conservative therapy.  This would help to improve the edema control and prevent sequela such as ulcers and infections   Patient should undergo duplex ultrasound of the venous system to ensure that DVT or reflux is not present.  The patient will follow-up with me after the ultrasound.        Leotis Pain 01/14/2018, 4:23 PM   This note was created with Dragon medical transcription system.  Any errors from dictation are unintentional.

## 2018-01-14 NOTE — Assessment & Plan Note (Signed)

## 2018-01-20 ENCOUNTER — Ambulatory Visit
Admission: RE | Admit: 2018-01-20 | Discharge: 2018-01-20 | Disposition: A | Discharge status: Home / Self Care | Payer: BLUE CROSS/BLUE SHIELD | Source: Ambulatory Visit | Attending: General Surgery | Admitting: General Surgery

## 2018-01-20 DIAGNOSIS — Z1231 Encounter for screening mammogram for malignant neoplasm of breast: Secondary | ICD-10-CM | POA: Insufficient documentation

## 2018-02-11 ENCOUNTER — Ambulatory Visit: Payer: BLUE CROSS/BLUE SHIELD | Admitting: General Surgery

## 2018-02-17 ENCOUNTER — Emergency Department
Admission: EM | Admit: 2018-02-17 | Discharge: 2018-02-17 | Disposition: A | Discharge status: Home / Self Care | Payer: BLUE CROSS/BLUE SHIELD | Attending: Student in an Organized Health Care Education/Training Program | Admitting: Student in an Organized Health Care Education/Training Program

## 2018-02-17 ENCOUNTER — Other Ambulatory Visit: Payer: Self-pay

## 2018-02-17 DIAGNOSIS — Z8673 Personal history of transient ischemic attack (TIA), and cerebral infarction without residual deficits: Secondary | ICD-10-CM | POA: Diagnosis not present

## 2018-02-17 DIAGNOSIS — I10 Essential (primary) hypertension: Secondary | ICD-10-CM | POA: Insufficient documentation

## 2018-02-17 DIAGNOSIS — Z7982 Long term (current) use of aspirin: Secondary | ICD-10-CM | POA: Insufficient documentation

## 2018-02-17 DIAGNOSIS — Z96641 Presence of right artificial hip joint: Secondary | ICD-10-CM | POA: Insufficient documentation

## 2018-02-17 DIAGNOSIS — Z7901 Long term (current) use of anticoagulants: Secondary | ICD-10-CM | POA: Insufficient documentation

## 2018-02-17 DIAGNOSIS — R791 Abnormal coagulation profile: Secondary | ICD-10-CM | POA: Diagnosis present

## 2018-02-17 DIAGNOSIS — Z87891 Personal history of nicotine dependence: Secondary | ICD-10-CM | POA: Diagnosis not present

## 2018-02-17 DIAGNOSIS — Z853 Personal history of malignant neoplasm of breast: Secondary | ICD-10-CM | POA: Insufficient documentation

## 2018-02-17 LAB — PROTIME-INR
INR: 5.95 — AB
PROTHROMBIN TIME: 52.8 s — AB (ref 11.4–15.2)

## 2018-02-17 LAB — APTT: aPTT: 90 seconds — ABNORMAL HIGH (ref 24–36)

## 2018-02-17 NOTE — ED Notes (Signed)
Date and time results received: 02/17/18 6:56 PM  (use smartphrase ".now" to insert current time)  Test: INR Critical Value: 5.95  Name of Provider Notified: Dr. Archie Balboa  Orders Received? Or Actions Taken?: Acknowledged

## 2018-02-17 NOTE — ED Triage Notes (Signed)
Pt states she had labs drawn today, INR 9.2. States she had recent increase in coumadin dose. Held coumadin today. Pt alert and oriented X4, active, cooperative, pt in NAD. RR even and unlabored, color WNL.

## 2018-02-17 NOTE — ED Provider Notes (Addendum)
[facility] Emergency Department Provider Note    First MD Initiated Contact with Patient 02/17/18 Doran Heater     (approximate)  I have reviewed the triage vital signs and the nursing notes.   HISTORY  Chief Complaint Abnormal Lab    HPI Tina Sharp is a 52 y.o. female on Coumadin for history of stroke presents to the ER for supratherapeutic INR.  States that she was recently increased to 3 mg daily roughly 2 weeks ago and this was her first follow-up INR check.  INR in clinic was over 9.  She denies any spontaneous bleeding or additional symptoms at this time.   Denies any melena or hematochezia   Past Medical History:  Diagnosis Date  . Anxiety   . Breast cancer ([place]) 01/2014   Invasive lobular carcinoma, 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 ER/ PR+; Her 2 neu not overexpressed, microscopic positive margin (skin). Oncotype DX, low risk for recurrence.  . Depression   . Genetic screening 05/05/2014   negative /[facility]  . GERD (gastroesophageal reflux disease)   . Hypertension   . 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 Breast cancer Mother 73  . Ovarian cancer Other   . Stroke Paternal Grandmother   . Breast cancer Maternal Grandmother 20  . Cancer Maternal Grandfather 74       prostate  . Prostate cancer Maternal Grandfather    Past Surgical History:  Procedure Laterality Date  . ABDOMINAL HYSTERECTOMY  2000   left both ovaries in  . BREAST SURGERY Left 04/08/14   mastectomy  . COLONOSCOPY    . EP IMPLANTABLE DEVICE N/A 06/14/2015   Procedure: Loop Recorder Insertion;  Surgeon: Evans Lance, MD;  Location: [facility];  Service: Cardiovascular;  Laterality: N/A;  . HIP ARTHROPLASTY Right 08/01/2016   Procedure: ARTHROPLASTY BIPOLAR HIP (HEMIARTHROPLASTY);  Surgeon: Dereck Leep, MD;  Location: [facility] ORS;  Service: Orthopedics;  Laterality: Right;    . LAPAROSCOPIC HYSTERECTOMY  2010   [facility]  . MASTECTOMY Left 2016  . REDUCTION MAMMAPLASTY Right 2016   Lift  . reduction mammoplasty Right 04/08/14   Dr Tula Nakayama  . TEE WITHOUT CARDIOVERSION N/A 06/14/2015   Procedure: TRANSESOPHAGEAL ECHOCARDIOGRAM (TEE);  Surgeon: Josue Hector, MD;  Location: [facility] ENDOSCOPY;  Service: Cardiovascular;  Laterality: N/A;  . TONSILLECTOMY AND ADENOIDECTOMY     pt was 52 years old   Patient Active Problem List   Diagnosis Date Noted  . Hypertension 01/14/2018  . Leg swelling 01/14/2018  . Breast calcifications on mammogram 06/24/2017  . Hip fracture ([place]) 07/30/2016  . 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 Rotator cuff tendinitis, left 06/15/2016  . Neuropathy 01/03/2016  . Ataxia 01/03/2016  . Myalgia 09/08/2015  . Depression 09/06/2015  . Cerebrovascular accident (CVA) due to thrombosis of posterior cerebral artery ([place]) 08/18/2015  . 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 Stroke with cerebral ischemia ([place]) 07/12/2015  . HLD (hyperlipidemia) 07/12/2015  . CVA (cerebral infarction) 06/21/2015  . TIA (transient ischemic attack) 06/21/2015  . Macrocytic anemia 06/20/2015  . Headache 06/14/2015  . Cigarette smoker 06/14/2015  . Stroke (cerebrum) ([place]) cryptogenic R brain infarcts s/p IV tPA  06/11/2015  . 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, estrogen receptor positive ([place]) 04/23/2014      Prior to Admission medications   Medication Sig Start Date  End Date Taking? Authorizing Provider  anastrozole (ARIMIDEX) 1 MG tablet Take 1 tablet (1 mg total) by mouth every evening. 06/15/15   Cammie Sickle, MD  aspirin EC 81 MG tablet Take by mouth.    [provider]  atorvastatin (LIPITOR) 40 MG tablet Take 1 tablet (40 mg total) by mouth daily at 6 PM. 11/09/15   Gladstone Lighter, MD  b complex vitamins tablet Take 1 tablet by mouth daily. 11/09/15   Gladstone Lighter, MD  baclofen (LIORESAL) 10  MG tablet Take 10 mg by mouth 3 (three) times daily.    [provider]  clonazePAM (KLONOPIN) 0.5 MG disintegrating tablet Take 0.5 mg by mouth 2 (two) times daily. 07/28/16   [provider]  cyanocobalamin 1000 MCG tablet Take 1 tablet (1,000 mcg total) by mouth daily. 11/09/15   Gladstone Lighter, MD  DULoxetine (CYMBALTA) 60 MG capsule Take 60 mg by mouth daily. 07/21/16   [provider]  folic acid (FOLVITE) 1 MG tablet Take 1 tablet (1 mg total) by mouth daily. 06/22/15   Kelvin Cellar, MD  gabapentin (NEURONTIN) 600 MG tablet Take 600 mg by mouth 4 (four) times daily. 07/22/16   [provider]  omeprazole (PRILOSEC) 20 MG capsule Take 20 mg by mouth as needed (heart burn).     [provider]  potassium chloride (K-DUR) 10 MEQ tablet Take by mouth. 05/15/17 05/15/18  [provider]  prochlorperazine (COMPAZINE) 5 MG tablet Take 5 mg by mouth every 6 (six) hours as needed for nausea or vomiting.    [provider]  pyridOXINE (B-6) 50 MG tablet Take by mouth.    [provider]  torsemide (DEMADEX) 20 MG tablet Take by mouth. 06/17/17 06/17/18  [provider]  warfarin (COUMADIN) 5 MG tablet Take 2.5-5 mg by mouth daily. Take 2.5 mg for 3 days, then take 5 mg until returning for lab draw. 07/26/16   [provider]    Allergies Patient has no known allergies.    Social History Social History   Tobacco Use  . Smoking status: Former Smoker    Packs/day: 0.25    Types: Cigarettes    Last attempt to quit: 10/12/2015    Years since quitting: 2.3  . Smokeless tobacco: Never Used  . Tobacco comment: E cig  Substance Use Topics  . Alcohol use: No    Alcohol/week: 1.8 oz    Types: 1 Glasses of wine, 1 Cans of beer, 1 Shots of liquor per week    Comment: social  . Drug use: No    Review of Systems Patient denies headaches, rhinorrhea, blurry vision, numbness, shortness of breath, chest pain,  edema, cough, abdominal pain, nausea, vomiting, diarrhea, dysuria, fevers, rashes or hallucinations unless otherwise stated above in HPI. ____________________________________________   PHYSICAL EXAM:  VITAL SIGNS: Vitals:   02/17/18 1721  BP: (!) 146/90  Pulse: (!) 112  Resp: 20  Temp: 98.8 F (37.1 C)  SpO2: 96%    Constitutional: Alert and oriented.  Eyes: Conjunctivae are normal.  Head: Atraumatic. Nose: No congestion/rhinnorhea. Mouth/Throat: Mucous membranes are moist.   Neck: No stridor. Painless ROM.  Cardiovascular: Normal rate, regular rhythm. Grossly normal heart sounds.  Good peripheral circulation. Respiratory: Normal respiratory effort.  No retractions. Lungs CTAB. Gastrointestinal: Soft and nontender. No distention. No abdominal bruits. No CVA tenderness. Genitourinary:  Musculoskeletal: No lower extremity tenderness nor edema.  No joint effusions. Neurologic:  Normal speech and language. No gross focal  neurologic deficits are appreciated. No facial droop Skin:  Skin is warm, dry and intact. No rash noted. Psychiatric: Mood and affect are normal. Speech and behavior are normal.  ____________________________________________   LABS (all labs ordered are listed, but only abnormal results are displayed)  Results for orders placed or performed during the hospital encounter of 02/17/18 (from the past 24 hour(s))  APTT     Status: Abnormal   Collection Time: 02/17/18  5:25 PM  Result Value Ref Range   aPTT 90 (H) 24 - 36 seconds  Protime-INR     Status: Abnormal   Collection Time: 02/17/18  5:25 PM  Result Value Ref Range   Prothrombin Time 52.8 (H) 11.4 - 15.2 seconds   INR 5.95 (HH)    ____________________________________________ ____________________________________________  RADIOLOGY   ____________________________________________   PROCEDURES  Procedure(s) performed:  Procedures    Critical Care performed:  no ____________________________________________   INITIAL IMPRESSION / ASSESSMENT AND PLAN / ED COURSE  Pertinent labs & imaging results that were available during my care of the patient were reviewed by me and considered in my medical decision making (see chart for details).   DDX: Supratherapeutic INR, hemorrhage, medication noncompliance  Tina Sharp is a 52 y.o. who presents to the ED with supratherapeutic INR initially to 9 and now with repeat at 5.  No evidence of spontaneous hemorrhage.  No indication for vitamin K reversal at this time as she is asymptomatic without any evidence of active hemorrhage..  Patient stable and appropriate for follow-up for repeat INR check with PCP.      As part of my medical decision making, I reviewed the following data within the [place] notes reviewed and incorporated, Labs reviewed, notes from prior ED visits.  ____________________________________________   FINAL CLINICAL IMPRESSION(S) / ED DIAGNOSES  Final diagnoses:  Supratherapeutic INR      NEW MEDICATIONS STARTED DURING THIS VISIT:  New Prescriptions   No medications on file     Note:  This document was prepared using Dragon voice recognition software and may include unintentional dictation errors.    Merlyn Lot, MD 02/17/18 Marjo Bicker    Merlyn Lot, MD 02/17/18 [phone]

## 2018-02-17 NOTE — Discharge Instructions (Addendum)
These follow-up with Dr. Ouida Sills tomorrow regarding additional Coumadin dosing and for repeat INR check in the next 24 to 48 hours.  Return to the ER should you develop any bleeding or for any additional questions or concerns.

## 2018-03-18 ENCOUNTER — Ambulatory Visit: Payer: Medicare HMO | Admitting: General Surgery

## 2018-03-18 ENCOUNTER — Encounter: Payer: Self-pay | Admitting: General Surgery

## 2018-03-18 VITALS — BP 132/76 | HR 74 | Ht 64.0 in | Wt 163.0 lb

## 2018-03-18 DIAGNOSIS — Z170 Estrogen receptor positive status [ER+]: Secondary | ICD-10-CM | POA: Diagnosis not present

## 2018-03-18 DIAGNOSIS — C50012 Malignant neoplasm of nipple and areola, left female breast: Secondary | ICD-10-CM

## 2018-03-18 NOTE — Patient Instructions (Addendum)
Patient will be asked to return to the office in one year with a right screening mammogram. The patient is aware to call back for any questions or concerns.  

## 2018-03-18 NOTE — Progress Notes (Signed)
Patient ID: Tina Sharp, female   DOB: 08-Nov-1965, 52 y.o.   MRN: [id number]  Chief Complaint  Patient presents with  . Follow-up    HPI Tina Sharp is a 52 y.o. female who presents for a breast evaluation. The most recent right breast screening   mammogram was done on 01/20/2018 . Patient fell 2 weeks ago when walking on gravel around her car.   Patient does perform regular self breast checks and gets regular mammograms done.    HPI  Past Medical History:  Diagnosis Date  . Anxiety   . Breast cancer ([place]) 01/2014   Invasive lobular carcinoma, 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 ER/ PR+; Her 2 neu not overexpressed, microscopic positive margin (skin). Oncotype DX, low risk for recurrence.  . Depression   . Genetic screening 05/05/2014   negative /[facility]  . GERD (gastroesophageal reflux disease)   . Hypertension   . 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 ABDOMINAL HYSTERECTOMY  2000   left both ovaries in  . BREAST SURGERY Left 04/08/14   mastectomy  . COLONOSCOPY    . EP IMPLANTABLE DEVICE N/A 06/14/2015   Procedure: Loop Recorder Insertion;  Surgeon: Evans Lance, MD;  Location: [facility];  Service: Cardiovascular;  Laterality: N/A;  . HIP ARTHROPLASTY Right 08/01/2016   Procedure: ARTHROPLASTY BIPOLAR HIP (HEMIARTHROPLASTY);  Surgeon: Dereck Leep, MD;  Location: [facility];  Service: Orthopedics;  Laterality: Right;  . LAPAROSCOPIC HYSTERECTOMY  2010   [facility]  . MASTECTOMY Left 2016  . REDUCTION MAMMAPLASTY Right 2016   Lift  . reduction mammoplasty Right 04/08/14   Dr Tula Nakayama  . TEE WITHOUT CARDIOVERSION N/A 06/14/2015   Procedure: TRANSESOPHAGEAL ECHOCARDIOGRAM (TEE);  Surgeon: Josue Hector, MD;  Location: [facility] ENDOSCOPY;  Service: Cardiovascular;  Laterality: N/A;  . TONSILLECTOMY AND ADENOIDECTOMY     pt was 52 years old    Family History  Problem  Relation Age of Onset  . Breast cancer Mother 44  . Ovarian cancer Other   . Stroke Paternal Grandmother   . Breast cancer Maternal Grandmother 50  . Cancer Maternal Grandfather 56       prostate  . Prostate cancer Maternal Grandfather     Social History Social History   Tobacco Use  . Smoking status: Former Smoker    Packs/day: 0.25    Types: Cigarettes    Last attempt to quit: 10/12/2015    Years since quitting: 2.4  . Smokeless tobacco: Never Used  . Tobacco comment: E cig  Substance Use Topics  . Alcohol use: No    Alcohol/week: 1.8 oz    Types: 1 Glasses of wine, 1 Cans of beer, 1 Shots of liquor per week    Comment: social  . Drug use: No    No Known Allergies  Current Outpatient Medications  Medication Sig Dispense Refill  . anastrozole (ARIMIDEX) 1 MG tablet Take 1 tablet (1 mg total) by mouth every evening. 7 tablet 0  . aspirin EC 81 MG tablet Take by mouth.    Marland Kitchen atorvastatin (LIPITOR) 40 MG tablet Take 1 tablet (40 mg total) by mouth daily at 6 PM. 30 tablet 2  . b complex vitamins tablet Take 1 tablet by mouth daily. 30 tablet 0  . baclofen (LIORESAL) 10 MG tablet Take 10 mg by mouth 3 (three) times daily.    Marland Kitchen  clonazePAM (KLONOPIN) 0.5 MG disintegrating tablet Take 0.5 mg by mouth 2 (two) times daily.    . cyanocobalamin 1000 MCG tablet Take 1 tablet (1,000 mcg total) by mouth daily. 30 tablet 2  . DULoxetine (CYMBALTA) 60 MG capsule Take 60 mg by mouth daily.    . folic acid (FOLVITE) 1 MG tablet Take 1 tablet (1 mg total) by mouth daily. 30 tablet 0  . gabapentin (NEURONTIN) 600 MG tablet Take 600 mg by mouth 4 (four) times daily.    Marland Kitchen omeprazole (PRILOSEC) 20 MG capsule Take 20 mg by mouth as needed (heart burn).     . potassium chloride (K-DUR) 10 MEQ tablet Take by mouth.    . prochlorperazine (COMPAZINE) 5 MG tablet Take 5 mg by mouth every 6 (six) hours as needed for nausea or vomiting.    . pyridOXINE (B-6) 50 MG tablet Take by mouth.    . torsemide  (DEMADEX) 20 MG tablet Take by mouth.    . warfarin (COUMADIN) 5 MG tablet Take 2.5-5 mg by mouth daily. Take 2.5 mg for 3 days, then take 5 mg until returning for lab draw.     No current facility-administered medications for this visit.     Review of Systems Review of Systems  Constitutional: Negative.   Respiratory: Negative.   Cardiovascular: Negative.     Blood pressure 132/76, pulse 74, height 5\' 4"  (1.626 m), weight 163 lb (73.9 kg).  Physical Exam Physical Exam  Constitutional: She is oriented to person, place, and time. She appears well-developed and well-nourished.  Eyes: Conjunctivae are normal. No scleral icterus.  Neck: Neck supple.  Cardiovascular: Normal rate, regular rhythm and normal heart sounds.  Pulmonary/Chest: Effort normal and breath sounds normal. Right breast exhibits no inverted nipple, no mass, no nipple discharge, no skin change and no tenderness. Left breast exhibits no inverted nipple, no mass, no nipple discharge, no skin change and no tenderness.    Lymphadenopathy:    She has no cervical adenopathy.    She has no axillary adenopathy.  Neurological: She is alert and oriented to person, place, and time.  Skin: Skin is warm and dry.    Data Reviewed Right breast screening mammogram dated January 20, 2018 was reviewed.  No interval change.  BI-RADS-1.  Postsurgical calcifications evident.  Assessment     No evidence of recurrent breast cancer.  Candidate for screening colonoscopy November 2019.    Plan  Patient will be asked to return to the office in one year with a right screening mammogram.The patient is aware to call back for any questions or concerns.   HPI, Physical Exam, Assessment and Plan have been scribed under the direction and in the presence of Hervey Ard, MD.  Gaspar Cola, Lyndon Shakora Nordquist 03/18/2018, 10:13 PM

## 2018-03-19 ENCOUNTER — Encounter (INDEPENDENT_AMBULATORY_CARE_PROVIDER_SITE_OTHER): Payer: Medicare HMO

## 2018-03-19 ENCOUNTER — Ambulatory Visit (INDEPENDENT_AMBULATORY_CARE_PROVIDER_SITE_OTHER): Payer: BLUE CROSS/BLUE SHIELD | Admitting: Vascular Surgery

## 2018-04-02 DIAGNOSIS — Z8673 Personal history of transient ischemic attack (TIA), and cerebral infarction without residual deficits: Secondary | ICD-10-CM | POA: Diagnosis not present

## 2018-04-02 DIAGNOSIS — H9313 Tinnitus, bilateral: Secondary | ICD-10-CM | POA: Diagnosis not present

## 2018-04-02 DIAGNOSIS — G629 Polyneuropathy, unspecified: Secondary | ICD-10-CM | POA: Diagnosis not present

## 2018-04-02 DIAGNOSIS — R209 Unspecified disturbances of skin sensation: Secondary | ICD-10-CM | POA: Diagnosis not present

## 2018-04-02 DIAGNOSIS — M7989 Other specified soft tissue disorders: Secondary | ICD-10-CM | POA: Diagnosis not present

## 2018-04-02 DIAGNOSIS — F39 Unspecified mood [affective] disorder: Secondary | ICD-10-CM | POA: Diagnosis not present

## 2018-04-10 DIAGNOSIS — R791 Abnormal coagulation profile: Secondary | ICD-10-CM | POA: Diagnosis not present

## 2018-04-11 DIAGNOSIS — Z8673 Personal history of transient ischemic attack (TIA), and cerebral infarction without residual deficits: Secondary | ICD-10-CM | POA: Diagnosis not present

## 2018-04-11 DIAGNOSIS — I63232 Cerebral infarction due to unspecified occlusion or stenosis of left carotid arteries: Secondary | ICD-10-CM | POA: Diagnosis not present

## 2018-04-24 DIAGNOSIS — G629 Polyneuropathy, unspecified: Secondary | ICD-10-CM | POA: Diagnosis not present

## 2018-04-24 DIAGNOSIS — F329 Major depressive disorder, single episode, unspecified: Secondary | ICD-10-CM | POA: Diagnosis not present

## 2018-04-24 DIAGNOSIS — I2510 Atherosclerotic heart disease of native coronary artery without angina pectoris: Secondary | ICD-10-CM | POA: Diagnosis not present

## 2018-04-24 DIAGNOSIS — I69354 Hemiplegia and hemiparesis following cerebral infarction affecting left non-dominant side: Secondary | ICD-10-CM | POA: Diagnosis not present

## 2018-04-24 DIAGNOSIS — E785 Hyperlipidemia, unspecified: Secondary | ICD-10-CM | POA: Diagnosis not present

## 2018-04-24 DIAGNOSIS — H9313 Tinnitus, bilateral: Secondary | ICD-10-CM | POA: Diagnosis not present

## 2018-04-24 DIAGNOSIS — G8929 Other chronic pain: Secondary | ICD-10-CM | POA: Diagnosis not present

## 2018-04-24 DIAGNOSIS — I10 Essential (primary) hypertension: Secondary | ICD-10-CM | POA: Diagnosis not present

## 2018-04-24 DIAGNOSIS — F39 Unspecified mood [affective] disorder: Secondary | ICD-10-CM | POA: Diagnosis not present

## 2018-04-25 DIAGNOSIS — H9313 Tinnitus, bilateral: Secondary | ICD-10-CM | POA: Diagnosis not present

## 2018-04-25 DIAGNOSIS — I2510 Atherosclerotic heart disease of native coronary artery without angina pectoris: Secondary | ICD-10-CM | POA: Diagnosis not present

## 2018-04-25 DIAGNOSIS — I10 Essential (primary) hypertension: Secondary | ICD-10-CM | POA: Diagnosis not present

## 2018-04-25 DIAGNOSIS — E785 Hyperlipidemia, unspecified: Secondary | ICD-10-CM | POA: Diagnosis not present

## 2018-04-25 DIAGNOSIS — I69354 Hemiplegia and hemiparesis following cerebral infarction affecting left non-dominant side: Secondary | ICD-10-CM | POA: Diagnosis not present

## 2018-04-25 DIAGNOSIS — G8929 Other chronic pain: Secondary | ICD-10-CM | POA: Diagnosis not present

## 2018-04-25 DIAGNOSIS — G629 Polyneuropathy, unspecified: Secondary | ICD-10-CM | POA: Diagnosis not present

## 2018-04-25 DIAGNOSIS — F329 Major depressive disorder, single episode, unspecified: Secondary | ICD-10-CM | POA: Diagnosis not present

## 2018-04-25 DIAGNOSIS — F39 Unspecified mood [affective] disorder: Secondary | ICD-10-CM | POA: Diagnosis not present

## 2018-04-30 ENCOUNTER — Ambulatory Visit (INDEPENDENT_AMBULATORY_CARE_PROVIDER_SITE_OTHER): Payer: Medicare HMO | Admitting: Vascular Surgery

## 2018-04-30 ENCOUNTER — Ambulatory Visit (INDEPENDENT_AMBULATORY_CARE_PROVIDER_SITE_OTHER): Payer: Medicare HMO

## 2018-04-30 ENCOUNTER — Encounter (INDEPENDENT_AMBULATORY_CARE_PROVIDER_SITE_OTHER): Payer: Self-pay | Admitting: Vascular Surgery

## 2018-04-30 VITALS — BP 141/98 | HR 107 | Resp 15 | Ht 62.0 in | Wt 160.0 lb

## 2018-04-30 DIAGNOSIS — F17210 Nicotine dependence, cigarettes, uncomplicated: Secondary | ICD-10-CM

## 2018-04-30 DIAGNOSIS — M7989 Other specified soft tissue disorders: Secondary | ICD-10-CM | POA: Diagnosis not present

## 2018-04-30 DIAGNOSIS — I10 Essential (primary) hypertension: Secondary | ICD-10-CM | POA: Diagnosis not present

## 2018-04-30 DIAGNOSIS — H9313 Tinnitus, bilateral: Secondary | ICD-10-CM | POA: Diagnosis not present

## 2018-04-30 DIAGNOSIS — I2510 Atherosclerotic heart disease of native coronary artery without angina pectoris: Secondary | ICD-10-CM | POA: Diagnosis not present

## 2018-04-30 DIAGNOSIS — G629 Polyneuropathy, unspecified: Secondary | ICD-10-CM | POA: Diagnosis not present

## 2018-04-30 DIAGNOSIS — E785 Hyperlipidemia, unspecified: Secondary | ICD-10-CM | POA: Diagnosis not present

## 2018-04-30 DIAGNOSIS — I69354 Hemiplegia and hemiparesis following cerebral infarction affecting left non-dominant side: Secondary | ICD-10-CM | POA: Diagnosis not present

## 2018-04-30 DIAGNOSIS — F39 Unspecified mood [affective] disorder: Secondary | ICD-10-CM | POA: Diagnosis not present

## 2018-04-30 DIAGNOSIS — G8929 Other chronic pain: Secondary | ICD-10-CM | POA: Diagnosis not present

## 2018-04-30 DIAGNOSIS — F329 Major depressive disorder, single episode, unspecified: Secondary | ICD-10-CM | POA: Diagnosis not present

## 2018-04-30 DIAGNOSIS — I890 Lymphedema, not elsewhere classified: Secondary | ICD-10-CM | POA: Insufficient documentation

## 2018-04-30 NOTE — Progress Notes (Signed)
Subjective:    Patient ID: Tina Sharp, female    DOB: 1965/10/09, 52 y.o.   MRN: [id number] Chief Complaint  Patient presents with  . Follow-up    Bilateral Venous f/u   Patient last seen on 01/14/2018 for evaluation of lower extremity edema and pain.  Since her initial visit, the patient has been engaging in conservative therapy including wearing her medical grade one compression socks, elevating her legs and remaining active with minimal improvement in her symptoms.  The patient is still experience a fair amount of swelling and discomfort to her bilateral legs.  The patient denies any erythema or ulcer formation to the bilateral lower extremity.  The patient underwent a bilateral lower extremity venous reflux study which was notable for no evidence of chronic venous insufficiency bilaterally.  No evidence of deep vein thrombosis bilaterally.  No evidence of superficial venous thrombosis bilaterally.  The patient feels that her symptoms have progressed to the point that she is unable to function on a daily basis and they have become lifestyle limiting.  The patient denies any fever, nausea vomiting.  Review of Systems  Constitutional: Negative.   HENT: Negative.   Eyes: Negative.   Respiratory: Negative.   Cardiovascular: Positive for leg swelling.  Gastrointestinal: Negative.   Endocrine: Negative.   Genitourinary: Negative.   Musculoskeletal: Negative.   Skin: Negative.   Allergic/Immunologic: Negative.   Neurological: Negative.   Hematological: Negative.   Psychiatric/Behavioral: Negative.       Objective:   Physical Exam  Constitutional: She is oriented to person, place, and time. She appears well-developed and well-nourished. No distress.  HENT:  Head: Normocephalic and atraumatic.  Right Ear: External ear normal.  Left Ear: External ear normal.  Eyes: Pupils are equal, round, and reactive to light. Conjunctivae and EOM are normal.  Neck: Normal range of motion.    Cardiovascular: Normal rate, regular rhythm, normal heart sounds and intact distal pulses.  Pulses:      Radial pulses are 2+ on the right side, and 2+ on the left side.       Dorsalis pedis pulses are 2+ on the right side, and 2+ on the left side.       Posterior tibial pulses are 2+ on the right side.  Pulmonary/Chest: Effort normal and breath sounds normal.  Musculoskeletal: Normal range of motion. She exhibits edema (Mild to moderate nonpitting edema noted bilaterally).  Neurological: She is alert and oriented to person, place, and time.  Skin: She is not diaphoretic.  Mild stasis dermatitis bilaterally.  No evidence of fibrosis, cellulitis and ulcer formation to the bilateral lower extremity.  Psychiatric: She has a normal mood and affect. Her behavior is normal. Judgment and thought content normal.  Vitals reviewed.  BP (!) 141/98 (BP Location: Right Arm, Patient Position: Sitting)   Pulse (!) 107   Resp 15   Ht 5\' 2"  (1.575 m)   Wt 160 lb (72.6 kg)   BMI 29.26 kg/m   Past Medical History:  Diagnosis Date  . Anxiety   . Breast cancer ([place]) 01/2014   Invasive lobular carcinoma, 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 ER/ PR+; Her 2 neu not overexpressed, microscopic positive margin (skin). Oncotype DX, low risk for recurrence.  . Depression   . Genetic screening 05/05/2014   negative /[facility]  . GERD (gastroesophageal reflux disease)   . Hypertension   . 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 Marital status: Married    Spouse name: Not on file  . Number of children: Not on file  . Years of education: Not on file  . Highest education level: Not on file  Occupational History  . Not on file  Social Needs  . Financial resource strain: Not on file  . Food insecurity:    Worry: Not on file    Inability: Not on file  . Transportation needs:    Medical: Not on file    Non-medical: Not on file   Tobacco Use  . Smoking status: Former Smoker    Packs/day: 0.25    Types: Cigarettes    Last attempt to quit: 10/12/2015    Years since quitting: 2.5  . Smokeless tobacco: Never Used  . Tobacco comment: E cig  Substance and Sexual Activity  . Alcohol use: No    Alcohol/week: 3.0 standard drinks    Types: 1 Glasses of wine, 1 Cans of beer, 1 Shots of liquor per week    Comment: social  . Drug use: No  . Sexual activity: Yes  Lifestyle  . Physical activity:    Days per week: Not on file    Minutes per session: Not on file  . Stress: Not on file  Relationships  . Social connections:    Talks on phone: Not on file    Gets together: Not on file    Attends religious service: Not on file    Active member of club or organization: Not on file    Attends meetings of clubs or organizations: Not on file    Relationship status: Not on file  . Intimate partner violence:    Fear of current or ex partner: Not on file    Emotionally abused: Not on file    Physically abused: Not on file    Forced sexual activity: Not on file  Other Topics Concern  . Not on file  Social History Narrative  . Not on file   Past Surgical History:  Procedure Laterality Date  . ABDOMINAL HYSTERECTOMY  2000   left both ovaries in  . BREAST SURGERY Left 04/08/14   mastectomy  . COLONOSCOPY  06/30/2008   Lucilla Lame, MD; chronic diarrhea. Negative ileal and colon biopsies.   . EP IMPLANTABLE DEVICE N/A 06/14/2015   Procedure: Loop Recorder Insertion;  Surgeon: Evans Lance, MD;  Location: [place] [facility];  Service: Cardiovascular;  Laterality: N/A;  . HIP ARTHROPLASTY Right 08/01/2016   Procedure: ARTHROPLASTY BIPOLAR HIP (HEMIARTHROPLASTY);  Surgeon: Dereck Leep, MD;  Location: [facility];  Service: Orthopedics;  Laterality: Right;  . LAPAROSCOPIC HYSTERECTOMY  2010   [facility]  . MASTECTOMY Left 2016  . REDUCTION MAMMAPLASTY Right 2016   Lift  . reduction mammoplasty Right 04/08/14   Dr Tula Nakayama   . TEE WITHOUT CARDIOVERSION N/A 06/14/2015   Procedure: TRANSESOPHAGEAL ECHOCARDIOGRAM (TEE);  Surgeon: Josue Hector, MD;  Location: [facility] ENDOSCOPY;  Service: Cardiovascular;  Laterality: N/A;  . TONSILLECTOMY AND ADENOIDECTOMY     pt was 52 years old   Family History  Problem Relation Age of Onset  . Breast cancer Mother 72  . Ovarian cancer Other   . Stroke Paternal Grandmother   . Breast cancer Maternal Grandmother 22  . Cancer Maternal Grandfather 98       prostate  . Prostate cancer Maternal Grandfather    No Known Allergies     Assessment & Plan:  Patient  last seen on 01/14/2018 for evaluation of lower extremity edema and pain.  Since her initial visit, the patient has been engaging in conservative therapy including wearing her medical grade one compression socks, elevating her legs and remaining active with minimal improvement in her symptoms.  The patient is still experience a fair amount of swelling and discomfort to her bilateral legs.  The patient denies any erythema or ulcer formation to the bilateral lower extremity.  The patient underwent a bilateral lower extremity venous reflux study which was notable for no evidence of chronic venous insufficiency bilaterally.  No evidence of deep vein thrombosis bilaterally.  No evidence of superficial venous thrombosis bilaterally.  The patient feels that her symptoms have progressed to the point that she is unable to function on a daily basis and they have become lifestyle limiting.  The patient denies any fever, nausea vomiting  1. Lymphedema - New Despite conservative treatments including exercise, elevation and class I compression stockings the patient still presents with stage I lymphedema. The patient has failed a trial of conservative therapy The patient notes that her symptoms have progressed to the point that she is unable to function on a daily basis and they have become lifestyle limiting The patient would greatly benefit from a  lymphedema pump The patient was encouraged to use the pump at least once a day for an hour Patient should still continue to wear medical grade 1 compression socks, elevate her legs remain active Patient should follow-up in 6 months so I can assess her progress with conservative therapy and the addition of lymphedema pump The patient was instructed to call the office in the interim if any worsening edema or ulcerations to the legs, feet or toes occurs. The patient expresses their understanding.  2. Cigarette smoker - Stable We had a discussion for approximately five minutes regarding the absolute need for smoking cessation due to the deleterious nature of tobacco on the vascular system. We discussed the tobacco use would diminish patency of any intervention, and likely significantly worsen progressio of disease. We discussed multiple agents for quitting including replacement therapy or medications to reduce cravings such as Chantix. The patient voices their understanding of the importance of smoking cessation.  Current Outpatient Medications on File Prior to Visit  Medication Sig Dispense Refill  . anastrozole (ARIMIDEX) 1 MG tablet Take 1 tablet (1 mg total) by mouth every evening. 7 tablet 0  . aspirin EC 81 MG tablet Take by mouth.    Marland Kitchen atorvastatin (LIPITOR) 40 MG tablet Take 1 tablet (40 mg total) by mouth daily at 6 PM. 30 tablet 2  . b complex vitamins tablet Take 1 tablet by mouth daily. 30 tablet 0  . baclofen (LIORESAL) 10 MG tablet Take 10 mg by mouth 3 (three) times daily.    . clonazePAM (KLONOPIN) 0.5 MG disintegrating tablet Take 0.5 mg by mouth 2 (two) times daily.    . cyanocobalamin 1000 MCG tablet Take 1 tablet (1,000 mcg total) by mouth daily. 30 tablet 2  . DULoxetine (CYMBALTA) 60 MG capsule Take 60 mg by mouth daily.    . folic acid (FOLVITE) 1 MG tablet Take 1 tablet (1 mg total) by mouth daily. 30 tablet 0  . gabapentin (NEURONTIN) 600 MG tablet Take 600 mg by mouth 4  (four) times daily.    Marland Kitchen omeprazole (PRILOSEC) 20 MG capsule Take 20 mg by mouth as needed (heart burn).     . potassium chloride (K-DUR) 10 MEQ tablet Take  by mouth.    . prochlorperazine (COMPAZINE) 5 MG tablet Take 5 mg by mouth every 6 (six) hours as needed for nausea or vomiting.    . pyridOXINE (B-6) 50 MG tablet Take by mouth.    . torsemide (DEMADEX) 20 MG tablet Take by mouth.    . warfarin (COUMADIN) 5 MG tablet Take 2.5-5 mg by mouth daily. Take 2.5 mg for 3 days, then take 5 mg until returning for lab draw.     No current facility-administered medications on file prior to visit.     There are no Patient Instructions on file for this visit. No follow-ups on file.   Emmilia Sowder A Mariateresa Batra, PA-C

## 2018-05-02 DIAGNOSIS — F39 Unspecified mood [affective] disorder: Secondary | ICD-10-CM | POA: Diagnosis not present

## 2018-05-02 DIAGNOSIS — G629 Polyneuropathy, unspecified: Secondary | ICD-10-CM | POA: Diagnosis not present

## 2018-05-02 DIAGNOSIS — G8929 Other chronic pain: Secondary | ICD-10-CM | POA: Diagnosis not present

## 2018-05-02 DIAGNOSIS — I69354 Hemiplegia and hemiparesis following cerebral infarction affecting left non-dominant side: Secondary | ICD-10-CM | POA: Diagnosis not present

## 2018-05-02 DIAGNOSIS — I2510 Atherosclerotic heart disease of native coronary artery without angina pectoris: Secondary | ICD-10-CM | POA: Diagnosis not present

## 2018-05-02 DIAGNOSIS — I10 Essential (primary) hypertension: Secondary | ICD-10-CM | POA: Diagnosis not present

## 2018-05-02 DIAGNOSIS — E785 Hyperlipidemia, unspecified: Secondary | ICD-10-CM | POA: Diagnosis not present

## 2018-05-02 DIAGNOSIS — H9313 Tinnitus, bilateral: Secondary | ICD-10-CM | POA: Diagnosis not present

## 2018-05-02 DIAGNOSIS — F329 Major depressive disorder, single episode, unspecified: Secondary | ICD-10-CM | POA: Diagnosis not present

## 2018-05-05 DIAGNOSIS — F329 Major depressive disorder, single episode, unspecified: Secondary | ICD-10-CM | POA: Diagnosis not present

## 2018-05-05 DIAGNOSIS — I2510 Atherosclerotic heart disease of native coronary artery without angina pectoris: Secondary | ICD-10-CM | POA: Diagnosis not present

## 2018-05-05 DIAGNOSIS — G629 Polyneuropathy, unspecified: Secondary | ICD-10-CM | POA: Diagnosis not present

## 2018-05-05 DIAGNOSIS — H9313 Tinnitus, bilateral: Secondary | ICD-10-CM | POA: Diagnosis not present

## 2018-05-05 DIAGNOSIS — G8929 Other chronic pain: Secondary | ICD-10-CM | POA: Diagnosis not present

## 2018-05-05 DIAGNOSIS — F39 Unspecified mood [affective] disorder: Secondary | ICD-10-CM | POA: Diagnosis not present

## 2018-05-05 DIAGNOSIS — I69354 Hemiplegia and hemiparesis following cerebral infarction affecting left non-dominant side: Secondary | ICD-10-CM | POA: Diagnosis not present

## 2018-05-05 DIAGNOSIS — E785 Hyperlipidemia, unspecified: Secondary | ICD-10-CM | POA: Diagnosis not present

## 2018-05-05 DIAGNOSIS — I10 Essential (primary) hypertension: Secondary | ICD-10-CM | POA: Diagnosis not present

## 2018-05-07 DIAGNOSIS — G8929 Other chronic pain: Secondary | ICD-10-CM | POA: Diagnosis not present

## 2018-05-07 DIAGNOSIS — F329 Major depressive disorder, single episode, unspecified: Secondary | ICD-10-CM | POA: Diagnosis not present

## 2018-05-07 DIAGNOSIS — I2510 Atherosclerotic heart disease of native coronary artery without angina pectoris: Secondary | ICD-10-CM | POA: Diagnosis not present

## 2018-05-07 DIAGNOSIS — F39 Unspecified mood [affective] disorder: Secondary | ICD-10-CM | POA: Diagnosis not present

## 2018-05-07 DIAGNOSIS — G629 Polyneuropathy, unspecified: Secondary | ICD-10-CM | POA: Diagnosis not present

## 2018-05-07 DIAGNOSIS — I10 Essential (primary) hypertension: Secondary | ICD-10-CM | POA: Diagnosis not present

## 2018-05-07 DIAGNOSIS — H9313 Tinnitus, bilateral: Secondary | ICD-10-CM | POA: Diagnosis not present

## 2018-05-07 DIAGNOSIS — E785 Hyperlipidemia, unspecified: Secondary | ICD-10-CM | POA: Diagnosis not present

## 2018-05-07 DIAGNOSIS — I69354 Hemiplegia and hemiparesis following cerebral infarction affecting left non-dominant side: Secondary | ICD-10-CM | POA: Diagnosis not present

## 2018-05-08 DIAGNOSIS — R791 Abnormal coagulation profile: Secondary | ICD-10-CM | POA: Diagnosis not present

## 2018-05-09 DIAGNOSIS — G629 Polyneuropathy, unspecified: Secondary | ICD-10-CM | POA: Diagnosis not present

## 2018-05-09 DIAGNOSIS — I10 Essential (primary) hypertension: Secondary | ICD-10-CM | POA: Diagnosis not present

## 2018-05-09 DIAGNOSIS — G8929 Other chronic pain: Secondary | ICD-10-CM | POA: Diagnosis not present

## 2018-05-09 DIAGNOSIS — I2510 Atherosclerotic heart disease of native coronary artery without angina pectoris: Secondary | ICD-10-CM | POA: Diagnosis not present

## 2018-05-09 DIAGNOSIS — E785 Hyperlipidemia, unspecified: Secondary | ICD-10-CM | POA: Diagnosis not present

## 2018-05-09 DIAGNOSIS — F39 Unspecified mood [affective] disorder: Secondary | ICD-10-CM | POA: Diagnosis not present

## 2018-05-09 DIAGNOSIS — H9313 Tinnitus, bilateral: Secondary | ICD-10-CM | POA: Diagnosis not present

## 2018-05-09 DIAGNOSIS — F329 Major depressive disorder, single episode, unspecified: Secondary | ICD-10-CM | POA: Diagnosis not present

## 2018-05-09 DIAGNOSIS — I69354 Hemiplegia and hemiparesis following cerebral infarction affecting left non-dominant side: Secondary | ICD-10-CM | POA: Diagnosis not present

## 2018-05-13 DIAGNOSIS — G8929 Other chronic pain: Secondary | ICD-10-CM | POA: Diagnosis not present

## 2018-05-13 DIAGNOSIS — I10 Essential (primary) hypertension: Secondary | ICD-10-CM | POA: Diagnosis not present

## 2018-05-13 DIAGNOSIS — G629 Polyneuropathy, unspecified: Secondary | ICD-10-CM | POA: Diagnosis not present

## 2018-05-13 DIAGNOSIS — I69354 Hemiplegia and hemiparesis following cerebral infarction affecting left non-dominant side: Secondary | ICD-10-CM | POA: Diagnosis not present

## 2018-05-13 DIAGNOSIS — E785 Hyperlipidemia, unspecified: Secondary | ICD-10-CM | POA: Diagnosis not present

## 2018-05-13 DIAGNOSIS — H9313 Tinnitus, bilateral: Secondary | ICD-10-CM | POA: Diagnosis not present

## 2018-05-13 DIAGNOSIS — F329 Major depressive disorder, single episode, unspecified: Secondary | ICD-10-CM | POA: Diagnosis not present

## 2018-05-13 DIAGNOSIS — F39 Unspecified mood [affective] disorder: Secondary | ICD-10-CM | POA: Diagnosis not present

## 2018-05-13 DIAGNOSIS — I2510 Atherosclerotic heart disease of native coronary artery without angina pectoris: Secondary | ICD-10-CM | POA: Diagnosis not present

## 2018-05-14 DIAGNOSIS — E785 Hyperlipidemia, unspecified: Secondary | ICD-10-CM | POA: Diagnosis not present

## 2018-05-14 DIAGNOSIS — I10 Essential (primary) hypertension: Secondary | ICD-10-CM | POA: Diagnosis not present

## 2018-05-14 DIAGNOSIS — F329 Major depressive disorder, single episode, unspecified: Secondary | ICD-10-CM | POA: Diagnosis not present

## 2018-05-14 DIAGNOSIS — G8929 Other chronic pain: Secondary | ICD-10-CM | POA: Diagnosis not present

## 2018-05-14 DIAGNOSIS — F39 Unspecified mood [affective] disorder: Secondary | ICD-10-CM | POA: Diagnosis not present

## 2018-05-14 DIAGNOSIS — I2510 Atherosclerotic heart disease of native coronary artery without angina pectoris: Secondary | ICD-10-CM | POA: Diagnosis not present

## 2018-05-14 DIAGNOSIS — I69354 Hemiplegia and hemiparesis following cerebral infarction affecting left non-dominant side: Secondary | ICD-10-CM | POA: Diagnosis not present

## 2018-05-14 DIAGNOSIS — H9313 Tinnitus, bilateral: Secondary | ICD-10-CM | POA: Diagnosis not present

## 2018-05-14 DIAGNOSIS — G629 Polyneuropathy, unspecified: Secondary | ICD-10-CM | POA: Diagnosis not present

## 2018-05-16 DIAGNOSIS — I2510 Atherosclerotic heart disease of native coronary artery without angina pectoris: Secondary | ICD-10-CM | POA: Diagnosis not present

## 2018-05-16 DIAGNOSIS — F329 Major depressive disorder, single episode, unspecified: Secondary | ICD-10-CM | POA: Diagnosis not present

## 2018-05-16 DIAGNOSIS — G8929 Other chronic pain: Secondary | ICD-10-CM | POA: Diagnosis not present

## 2018-05-16 DIAGNOSIS — I69354 Hemiplegia and hemiparesis following cerebral infarction affecting left non-dominant side: Secondary | ICD-10-CM | POA: Diagnosis not present

## 2018-05-16 DIAGNOSIS — F39 Unspecified mood [affective] disorder: Secondary | ICD-10-CM | POA: Diagnosis not present

## 2018-05-16 DIAGNOSIS — I10 Essential (primary) hypertension: Secondary | ICD-10-CM | POA: Diagnosis not present

## 2018-05-16 DIAGNOSIS — G629 Polyneuropathy, unspecified: Secondary | ICD-10-CM | POA: Diagnosis not present

## 2018-05-16 DIAGNOSIS — E785 Hyperlipidemia, unspecified: Secondary | ICD-10-CM | POA: Diagnosis not present

## 2018-05-16 DIAGNOSIS — H9313 Tinnitus, bilateral: Secondary | ICD-10-CM | POA: Diagnosis not present

## 2018-05-19 DIAGNOSIS — I10 Essential (primary) hypertension: Secondary | ICD-10-CM | POA: Diagnosis not present

## 2018-05-19 DIAGNOSIS — I69354 Hemiplegia and hemiparesis following cerebral infarction affecting left non-dominant side: Secondary | ICD-10-CM | POA: Diagnosis not present

## 2018-05-19 DIAGNOSIS — G8929 Other chronic pain: Secondary | ICD-10-CM | POA: Diagnosis not present

## 2018-05-19 DIAGNOSIS — F329 Major depressive disorder, single episode, unspecified: Secondary | ICD-10-CM | POA: Diagnosis not present

## 2018-05-19 DIAGNOSIS — G629 Polyneuropathy, unspecified: Secondary | ICD-10-CM | POA: Diagnosis not present

## 2018-05-19 DIAGNOSIS — I2510 Atherosclerotic heart disease of native coronary artery without angina pectoris: Secondary | ICD-10-CM | POA: Diagnosis not present

## 2018-05-19 DIAGNOSIS — H9313 Tinnitus, bilateral: Secondary | ICD-10-CM | POA: Diagnosis not present

## 2018-05-19 DIAGNOSIS — F39 Unspecified mood [affective] disorder: Secondary | ICD-10-CM | POA: Diagnosis not present

## 2018-05-19 DIAGNOSIS — E785 Hyperlipidemia, unspecified: Secondary | ICD-10-CM | POA: Diagnosis not present

## 2018-05-21 DIAGNOSIS — I10 Essential (primary) hypertension: Secondary | ICD-10-CM | POA: Diagnosis not present

## 2018-05-21 DIAGNOSIS — F39 Unspecified mood [affective] disorder: Secondary | ICD-10-CM | POA: Diagnosis not present

## 2018-05-21 DIAGNOSIS — I69354 Hemiplegia and hemiparesis following cerebral infarction affecting left non-dominant side: Secondary | ICD-10-CM | POA: Diagnosis not present

## 2018-05-21 DIAGNOSIS — I2510 Atherosclerotic heart disease of native coronary artery without angina pectoris: Secondary | ICD-10-CM | POA: Diagnosis not present

## 2018-05-21 DIAGNOSIS — G8929 Other chronic pain: Secondary | ICD-10-CM | POA: Diagnosis not present

## 2018-05-21 DIAGNOSIS — F329 Major depressive disorder, single episode, unspecified: Secondary | ICD-10-CM | POA: Diagnosis not present

## 2018-05-21 DIAGNOSIS — E785 Hyperlipidemia, unspecified: Secondary | ICD-10-CM | POA: Diagnosis not present

## 2018-05-21 DIAGNOSIS — H9313 Tinnitus, bilateral: Secondary | ICD-10-CM | POA: Diagnosis not present

## 2018-05-21 DIAGNOSIS — G629 Polyneuropathy, unspecified: Secondary | ICD-10-CM | POA: Diagnosis not present

## 2018-05-23 DIAGNOSIS — H9313 Tinnitus, bilateral: Secondary | ICD-10-CM | POA: Diagnosis not present

## 2018-05-23 DIAGNOSIS — E785 Hyperlipidemia, unspecified: Secondary | ICD-10-CM | POA: Diagnosis not present

## 2018-05-23 DIAGNOSIS — F39 Unspecified mood [affective] disorder: Secondary | ICD-10-CM | POA: Diagnosis not present

## 2018-05-23 DIAGNOSIS — G629 Polyneuropathy, unspecified: Secondary | ICD-10-CM | POA: Diagnosis not present

## 2018-05-23 DIAGNOSIS — I69354 Hemiplegia and hemiparesis following cerebral infarction affecting left non-dominant side: Secondary | ICD-10-CM | POA: Diagnosis not present

## 2018-05-23 DIAGNOSIS — G8929 Other chronic pain: Secondary | ICD-10-CM | POA: Diagnosis not present

## 2018-05-23 DIAGNOSIS — I2510 Atherosclerotic heart disease of native coronary artery without angina pectoris: Secondary | ICD-10-CM | POA: Diagnosis not present

## 2018-05-23 DIAGNOSIS — I10 Essential (primary) hypertension: Secondary | ICD-10-CM | POA: Diagnosis not present

## 2018-05-23 DIAGNOSIS — F329 Major depressive disorder, single episode, unspecified: Secondary | ICD-10-CM | POA: Diagnosis not present

## 2018-05-27 DIAGNOSIS — E785 Hyperlipidemia, unspecified: Secondary | ICD-10-CM | POA: Diagnosis not present

## 2018-05-27 DIAGNOSIS — F329 Major depressive disorder, single episode, unspecified: Secondary | ICD-10-CM | POA: Diagnosis not present

## 2018-05-27 DIAGNOSIS — H9313 Tinnitus, bilateral: Secondary | ICD-10-CM | POA: Diagnosis not present

## 2018-05-27 DIAGNOSIS — G8929 Other chronic pain: Secondary | ICD-10-CM | POA: Diagnosis not present

## 2018-05-27 DIAGNOSIS — G629 Polyneuropathy, unspecified: Secondary | ICD-10-CM | POA: Diagnosis not present

## 2018-05-27 DIAGNOSIS — F39 Unspecified mood [affective] disorder: Secondary | ICD-10-CM | POA: Diagnosis not present

## 2018-05-27 DIAGNOSIS — I69354 Hemiplegia and hemiparesis following cerebral infarction affecting left non-dominant side: Secondary | ICD-10-CM | POA: Diagnosis not present

## 2018-05-27 DIAGNOSIS — I2510 Atherosclerotic heart disease of native coronary artery without angina pectoris: Secondary | ICD-10-CM | POA: Diagnosis not present

## 2018-05-27 DIAGNOSIS — I10 Essential (primary) hypertension: Secondary | ICD-10-CM | POA: Diagnosis not present

## 2018-05-29 DIAGNOSIS — F329 Major depressive disorder, single episode, unspecified: Secondary | ICD-10-CM | POA: Diagnosis not present

## 2018-05-29 DIAGNOSIS — I2510 Atherosclerotic heart disease of native coronary artery without angina pectoris: Secondary | ICD-10-CM | POA: Diagnosis not present

## 2018-05-29 DIAGNOSIS — G8929 Other chronic pain: Secondary | ICD-10-CM | POA: Diagnosis not present

## 2018-05-29 DIAGNOSIS — G629 Polyneuropathy, unspecified: Secondary | ICD-10-CM | POA: Diagnosis not present

## 2018-05-29 DIAGNOSIS — F39 Unspecified mood [affective] disorder: Secondary | ICD-10-CM | POA: Diagnosis not present

## 2018-05-29 DIAGNOSIS — H9313 Tinnitus, bilateral: Secondary | ICD-10-CM | POA: Diagnosis not present

## 2018-05-29 DIAGNOSIS — E785 Hyperlipidemia, unspecified: Secondary | ICD-10-CM | POA: Diagnosis not present

## 2018-05-29 DIAGNOSIS — I69354 Hemiplegia and hemiparesis following cerebral infarction affecting left non-dominant side: Secondary | ICD-10-CM | POA: Diagnosis not present

## 2018-05-29 DIAGNOSIS — I10 Essential (primary) hypertension: Secondary | ICD-10-CM | POA: Diagnosis not present

## 2018-05-31 DIAGNOSIS — G629 Polyneuropathy, unspecified: Secondary | ICD-10-CM | POA: Diagnosis not present

## 2018-05-31 DIAGNOSIS — I2510 Atherosclerotic heart disease of native coronary artery without angina pectoris: Secondary | ICD-10-CM | POA: Diagnosis not present

## 2018-05-31 DIAGNOSIS — F329 Major depressive disorder, single episode, unspecified: Secondary | ICD-10-CM | POA: Diagnosis not present

## 2018-05-31 DIAGNOSIS — G8929 Other chronic pain: Secondary | ICD-10-CM | POA: Diagnosis not present

## 2018-05-31 DIAGNOSIS — I69354 Hemiplegia and hemiparesis following cerebral infarction affecting left non-dominant side: Secondary | ICD-10-CM | POA: Diagnosis not present

## 2018-05-31 DIAGNOSIS — H9313 Tinnitus, bilateral: Secondary | ICD-10-CM | POA: Diagnosis not present

## 2018-05-31 DIAGNOSIS — I10 Essential (primary) hypertension: Secondary | ICD-10-CM | POA: Diagnosis not present

## 2018-05-31 DIAGNOSIS — F39 Unspecified mood [affective] disorder: Secondary | ICD-10-CM | POA: Diagnosis not present

## 2018-05-31 DIAGNOSIS — E785 Hyperlipidemia, unspecified: Secondary | ICD-10-CM | POA: Diagnosis not present

## 2018-06-02 DIAGNOSIS — G8929 Other chronic pain: Secondary | ICD-10-CM | POA: Diagnosis not present

## 2018-06-02 DIAGNOSIS — I2510 Atherosclerotic heart disease of native coronary artery without angina pectoris: Secondary | ICD-10-CM | POA: Diagnosis not present

## 2018-06-02 DIAGNOSIS — I69354 Hemiplegia and hemiparesis following cerebral infarction affecting left non-dominant side: Secondary | ICD-10-CM | POA: Diagnosis not present

## 2018-06-02 DIAGNOSIS — F39 Unspecified mood [affective] disorder: Secondary | ICD-10-CM | POA: Diagnosis not present

## 2018-06-02 DIAGNOSIS — I10 Essential (primary) hypertension: Secondary | ICD-10-CM | POA: Diagnosis not present

## 2018-06-02 DIAGNOSIS — E785 Hyperlipidemia, unspecified: Secondary | ICD-10-CM | POA: Diagnosis not present

## 2018-06-02 DIAGNOSIS — F329 Major depressive disorder, single episode, unspecified: Secondary | ICD-10-CM | POA: Diagnosis not present

## 2018-06-02 DIAGNOSIS — H9313 Tinnitus, bilateral: Secondary | ICD-10-CM | POA: Diagnosis not present

## 2018-06-02 DIAGNOSIS — G629 Polyneuropathy, unspecified: Secondary | ICD-10-CM | POA: Diagnosis not present

## 2018-06-03 DIAGNOSIS — I2510 Atherosclerotic heart disease of native coronary artery without angina pectoris: Secondary | ICD-10-CM | POA: Diagnosis not present

## 2018-06-03 DIAGNOSIS — E785 Hyperlipidemia, unspecified: Secondary | ICD-10-CM | POA: Diagnosis not present

## 2018-06-03 DIAGNOSIS — G629 Polyneuropathy, unspecified: Secondary | ICD-10-CM | POA: Diagnosis not present

## 2018-06-03 DIAGNOSIS — F39 Unspecified mood [affective] disorder: Secondary | ICD-10-CM | POA: Diagnosis not present

## 2018-06-03 DIAGNOSIS — H9313 Tinnitus, bilateral: Secondary | ICD-10-CM | POA: Diagnosis not present

## 2018-06-03 DIAGNOSIS — F329 Major depressive disorder, single episode, unspecified: Secondary | ICD-10-CM | POA: Diagnosis not present

## 2018-06-03 DIAGNOSIS — G8929 Other chronic pain: Secondary | ICD-10-CM | POA: Diagnosis not present

## 2018-06-03 DIAGNOSIS — I10 Essential (primary) hypertension: Secondary | ICD-10-CM | POA: Diagnosis not present

## 2018-06-03 DIAGNOSIS — I69354 Hemiplegia and hemiparesis following cerebral infarction affecting left non-dominant side: Secondary | ICD-10-CM | POA: Diagnosis not present

## 2018-06-04 DIAGNOSIS — F329 Major depressive disorder, single episode, unspecified: Secondary | ICD-10-CM | POA: Diagnosis not present

## 2018-06-04 DIAGNOSIS — G8929 Other chronic pain: Secondary | ICD-10-CM | POA: Diagnosis not present

## 2018-06-04 DIAGNOSIS — H9313 Tinnitus, bilateral: Secondary | ICD-10-CM | POA: Diagnosis not present

## 2018-06-04 DIAGNOSIS — E785 Hyperlipidemia, unspecified: Secondary | ICD-10-CM | POA: Diagnosis not present

## 2018-06-04 DIAGNOSIS — G629 Polyneuropathy, unspecified: Secondary | ICD-10-CM | POA: Diagnosis not present

## 2018-06-04 DIAGNOSIS — F39 Unspecified mood [affective] disorder: Secondary | ICD-10-CM | POA: Diagnosis not present

## 2018-06-04 DIAGNOSIS — I69354 Hemiplegia and hemiparesis following cerebral infarction affecting left non-dominant side: Secondary | ICD-10-CM | POA: Diagnosis not present

## 2018-06-04 DIAGNOSIS — I2510 Atherosclerotic heart disease of native coronary artery without angina pectoris: Secondary | ICD-10-CM | POA: Diagnosis not present

## 2018-06-04 DIAGNOSIS — I10 Essential (primary) hypertension: Secondary | ICD-10-CM | POA: Diagnosis not present

## 2018-06-06 DIAGNOSIS — G8929 Other chronic pain: Secondary | ICD-10-CM | POA: Diagnosis not present

## 2018-06-06 DIAGNOSIS — E785 Hyperlipidemia, unspecified: Secondary | ICD-10-CM | POA: Diagnosis not present

## 2018-06-06 DIAGNOSIS — H9313 Tinnitus, bilateral: Secondary | ICD-10-CM | POA: Diagnosis not present

## 2018-06-06 DIAGNOSIS — G629 Polyneuropathy, unspecified: Secondary | ICD-10-CM | POA: Diagnosis not present

## 2018-06-06 DIAGNOSIS — F39 Unspecified mood [affective] disorder: Secondary | ICD-10-CM | POA: Diagnosis not present

## 2018-06-06 DIAGNOSIS — I69354 Hemiplegia and hemiparesis following cerebral infarction affecting left non-dominant side: Secondary | ICD-10-CM | POA: Diagnosis not present

## 2018-06-06 DIAGNOSIS — I10 Essential (primary) hypertension: Secondary | ICD-10-CM | POA: Diagnosis not present

## 2018-06-06 DIAGNOSIS — F329 Major depressive disorder, single episode, unspecified: Secondary | ICD-10-CM | POA: Diagnosis not present

## 2018-06-06 DIAGNOSIS — I2510 Atherosclerotic heart disease of native coronary artery without angina pectoris: Secondary | ICD-10-CM | POA: Diagnosis not present

## 2018-06-09 DIAGNOSIS — F329 Major depressive disorder, single episode, unspecified: Secondary | ICD-10-CM | POA: Diagnosis not present

## 2018-06-09 DIAGNOSIS — H9313 Tinnitus, bilateral: Secondary | ICD-10-CM | POA: Diagnosis not present

## 2018-06-09 DIAGNOSIS — G629 Polyneuropathy, unspecified: Secondary | ICD-10-CM | POA: Diagnosis not present

## 2018-06-09 DIAGNOSIS — F39 Unspecified mood [affective] disorder: Secondary | ICD-10-CM | POA: Diagnosis not present

## 2018-06-09 DIAGNOSIS — G8929 Other chronic pain: Secondary | ICD-10-CM | POA: Diagnosis not present

## 2018-06-09 DIAGNOSIS — E785 Hyperlipidemia, unspecified: Secondary | ICD-10-CM | POA: Diagnosis not present

## 2018-06-09 DIAGNOSIS — I2510 Atherosclerotic heart disease of native coronary artery without angina pectoris: Secondary | ICD-10-CM | POA: Diagnosis not present

## 2018-06-09 DIAGNOSIS — I10 Essential (primary) hypertension: Secondary | ICD-10-CM | POA: Diagnosis not present

## 2018-06-09 DIAGNOSIS — I69354 Hemiplegia and hemiparesis following cerebral infarction affecting left non-dominant side: Secondary | ICD-10-CM | POA: Diagnosis not present

## 2018-06-11 DIAGNOSIS — F39 Unspecified mood [affective] disorder: Secondary | ICD-10-CM | POA: Diagnosis not present

## 2018-06-11 DIAGNOSIS — I69354 Hemiplegia and hemiparesis following cerebral infarction affecting left non-dominant side: Secondary | ICD-10-CM | POA: Diagnosis not present

## 2018-06-11 DIAGNOSIS — E785 Hyperlipidemia, unspecified: Secondary | ICD-10-CM | POA: Diagnosis not present

## 2018-06-11 DIAGNOSIS — H9313 Tinnitus, bilateral: Secondary | ICD-10-CM | POA: Diagnosis not present

## 2018-06-11 DIAGNOSIS — I2510 Atherosclerotic heart disease of native coronary artery without angina pectoris: Secondary | ICD-10-CM | POA: Diagnosis not present

## 2018-06-11 DIAGNOSIS — G8929 Other chronic pain: Secondary | ICD-10-CM | POA: Diagnosis not present

## 2018-06-11 DIAGNOSIS — F329 Major depressive disorder, single episode, unspecified: Secondary | ICD-10-CM | POA: Diagnosis not present

## 2018-06-11 DIAGNOSIS — I10 Essential (primary) hypertension: Secondary | ICD-10-CM | POA: Diagnosis not present

## 2018-06-11 DIAGNOSIS — G629 Polyneuropathy, unspecified: Secondary | ICD-10-CM | POA: Diagnosis not present

## 2018-06-13 DIAGNOSIS — G8929 Other chronic pain: Secondary | ICD-10-CM | POA: Diagnosis not present

## 2018-06-13 DIAGNOSIS — G629 Polyneuropathy, unspecified: Secondary | ICD-10-CM | POA: Diagnosis not present

## 2018-06-13 DIAGNOSIS — H9313 Tinnitus, bilateral: Secondary | ICD-10-CM | POA: Diagnosis not present

## 2018-06-13 DIAGNOSIS — F329 Major depressive disorder, single episode, unspecified: Secondary | ICD-10-CM | POA: Diagnosis not present

## 2018-06-13 DIAGNOSIS — I69354 Hemiplegia and hemiparesis following cerebral infarction affecting left non-dominant side: Secondary | ICD-10-CM | POA: Diagnosis not present

## 2018-06-13 DIAGNOSIS — F39 Unspecified mood [affective] disorder: Secondary | ICD-10-CM | POA: Diagnosis not present

## 2018-06-13 DIAGNOSIS — I2510 Atherosclerotic heart disease of native coronary artery without angina pectoris: Secondary | ICD-10-CM | POA: Diagnosis not present

## 2018-06-13 DIAGNOSIS — E785 Hyperlipidemia, unspecified: Secondary | ICD-10-CM | POA: Diagnosis not present

## 2018-06-13 DIAGNOSIS — I10 Essential (primary) hypertension: Secondary | ICD-10-CM | POA: Diagnosis not present

## 2018-07-01 DIAGNOSIS — G629 Polyneuropathy, unspecified: Secondary | ICD-10-CM | POA: Diagnosis not present

## 2018-07-01 DIAGNOSIS — R209 Unspecified disturbances of skin sensation: Secondary | ICD-10-CM | POA: Diagnosis not present

## 2018-07-01 DIAGNOSIS — F39 Unspecified mood [affective] disorder: Secondary | ICD-10-CM | POA: Diagnosis not present

## 2018-07-01 DIAGNOSIS — M7989 Other specified soft tissue disorders: Secondary | ICD-10-CM | POA: Diagnosis not present

## 2018-09-01 DIAGNOSIS — M7061 Trochanteric bursitis, right hip: Secondary | ICD-10-CM | POA: Diagnosis not present

## 2018-09-01 DIAGNOSIS — M25551 Pain in right hip: Secondary | ICD-10-CM | POA: Diagnosis not present

## 2018-09-12 ENCOUNTER — Emergency Department: Payer: Medicare HMO

## 2018-09-12 ENCOUNTER — Other Ambulatory Visit: Payer: Self-pay

## 2018-09-12 ENCOUNTER — Inpatient Hospital Stay: Payer: Medicare HMO

## 2018-09-12 ENCOUNTER — Inpatient Hospital Stay
Admission: EM | Admit: 2018-09-12 | Discharge: 2018-09-17 | DRG: 516 | Disposition: A | Discharge status: Skilled Nursing Facility | Payer: Medicare HMO | Attending: Internal Medicine | Admitting: Internal Medicine

## 2018-09-12 ENCOUNTER — Encounter: Payer: Self-pay | Admitting: Emergency Medicine

## 2018-09-12 DIAGNOSIS — Z853 Personal history of malignant neoplasm of breast: Secondary | ICD-10-CM

## 2018-09-12 DIAGNOSIS — F419 Anxiety disorder, unspecified: Secondary | ICD-10-CM | POA: Diagnosis present

## 2018-09-12 DIAGNOSIS — S0990XA Unspecified injury of head, initial encounter: Secondary | ICD-10-CM | POA: Diagnosis not present

## 2018-09-12 DIAGNOSIS — M2550 Pain in unspecified joint: Secondary | ICD-10-CM | POA: Diagnosis not present

## 2018-09-12 DIAGNOSIS — S22080A Wedge compression fracture of T11-T12 vertebra, initial encounter for closed fracture: Secondary | ICD-10-CM | POA: Diagnosis not present

## 2018-09-12 DIAGNOSIS — Z79899 Other long term (current) drug therapy: Secondary | ICD-10-CM

## 2018-09-12 DIAGNOSIS — Z8041 Family history of malignant neoplasm of ovary: Secondary | ICD-10-CM | POA: Diagnosis not present

## 2018-09-12 DIAGNOSIS — G629 Polyneuropathy, unspecified: Secondary | ICD-10-CM | POA: Diagnosis present

## 2018-09-12 DIAGNOSIS — E876 Hypokalemia: Secondary | ICD-10-CM | POA: Diagnosis present

## 2018-09-12 DIAGNOSIS — G609 Hereditary and idiopathic neuropathy, unspecified: Secondary | ICD-10-CM | POA: Diagnosis not present

## 2018-09-12 DIAGNOSIS — R296 Repeated falls: Secondary | ICD-10-CM | POA: Diagnosis present

## 2018-09-12 DIAGNOSIS — F1010 Alcohol abuse, uncomplicated: Secondary | ICD-10-CM | POA: Diagnosis not present

## 2018-09-12 DIAGNOSIS — Y92002 Bathroom of unspecified non-institutional (private) residence single-family (private) house as the place of occurrence of the external cause: Secondary | ICD-10-CM

## 2018-09-12 DIAGNOSIS — S22000A Wedge compression fracture of unspecified thoracic vertebra, initial encounter for closed fracture: Secondary | ICD-10-CM

## 2018-09-12 DIAGNOSIS — M545 Low back pain: Secondary | ICD-10-CM | POA: Diagnosis not present

## 2018-09-12 DIAGNOSIS — I10 Essential (primary) hypertension: Secondary | ICD-10-CM | POA: Diagnosis present

## 2018-09-12 DIAGNOSIS — F329 Major depressive disorder, single episode, unspecified: Secondary | ICD-10-CM | POA: Diagnosis present

## 2018-09-12 DIAGNOSIS — R270 Ataxia, unspecified: Secondary | ICD-10-CM | POA: Diagnosis not present

## 2018-09-12 DIAGNOSIS — M546 Pain in thoracic spine: Secondary | ICD-10-CM | POA: Diagnosis not present

## 2018-09-12 DIAGNOSIS — E785 Hyperlipidemia, unspecified: Secondary | ICD-10-CM | POA: Diagnosis not present

## 2018-09-12 DIAGNOSIS — Z9221 Personal history of antineoplastic chemotherapy: Secondary | ICD-10-CM

## 2018-09-12 DIAGNOSIS — Z419 Encounter for procedure for purposes other than remedying health state, unspecified: Secondary | ICD-10-CM

## 2018-09-12 DIAGNOSIS — F10239 Alcohol dependence with withdrawal, unspecified: Secondary | ICD-10-CM | POA: Diagnosis present

## 2018-09-12 DIAGNOSIS — Z923 Personal history of irradiation: Secondary | ICD-10-CM

## 2018-09-12 DIAGNOSIS — W19XXXA Unspecified fall, initial encounter: Secondary | ICD-10-CM | POA: Diagnosis present

## 2018-09-12 DIAGNOSIS — F418 Other specified anxiety disorders: Secondary | ICD-10-CM | POA: Diagnosis not present

## 2018-09-12 DIAGNOSIS — Z87891 Personal history of nicotine dependence: Secondary | ICD-10-CM

## 2018-09-12 DIAGNOSIS — E871 Hypo-osmolality and hyponatremia: Secondary | ICD-10-CM | POA: Diagnosis present

## 2018-09-12 DIAGNOSIS — S22009D Unspecified fracture of unspecified thoracic vertebra, subsequent encounter for fracture with routine healing: Secondary | ICD-10-CM | POA: Diagnosis not present

## 2018-09-12 DIAGNOSIS — E860 Dehydration: Secondary | ICD-10-CM | POA: Diagnosis present

## 2018-09-12 DIAGNOSIS — Z90710 Acquired absence of both cervix and uterus: Secondary | ICD-10-CM

## 2018-09-12 DIAGNOSIS — S0003XA Contusion of scalp, initial encounter: Secondary | ICD-10-CM | POA: Diagnosis present

## 2018-09-12 DIAGNOSIS — M6281 Muscle weakness (generalized): Secondary | ICD-10-CM | POA: Diagnosis not present

## 2018-09-12 DIAGNOSIS — M542 Cervicalgia: Secondary | ICD-10-CM | POA: Diagnosis present

## 2018-09-12 DIAGNOSIS — K219 Gastro-esophageal reflux disease without esophagitis: Secondary | ICD-10-CM | POA: Diagnosis present

## 2018-09-12 DIAGNOSIS — I69351 Hemiplegia and hemiparesis following cerebral infarction affecting right dominant side: Secondary | ICD-10-CM

## 2018-09-12 DIAGNOSIS — S3992XA Unspecified injury of lower back, initial encounter: Secondary | ICD-10-CM | POA: Diagnosis not present

## 2018-09-12 DIAGNOSIS — G464 Cerebellar stroke syndrome: Secondary | ICD-10-CM | POA: Diagnosis not present

## 2018-09-12 DIAGNOSIS — Z823 Family history of stroke: Secondary | ICD-10-CM

## 2018-09-12 DIAGNOSIS — I639 Cerebral infarction, unspecified: Secondary | ICD-10-CM | POA: Diagnosis not present

## 2018-09-12 DIAGNOSIS — R52 Pain, unspecified: Secondary | ICD-10-CM | POA: Diagnosis not present

## 2018-09-12 DIAGNOSIS — R220 Localized swelling, mass and lump, head: Secondary | ICD-10-CM | POA: Diagnosis not present

## 2018-09-12 DIAGNOSIS — F10930 Alcohol use, unspecified with withdrawal, uncomplicated: Secondary | ICD-10-CM | POA: Diagnosis present

## 2018-09-12 DIAGNOSIS — Z803 Family history of malignant neoplasm of breast: Secondary | ICD-10-CM

## 2018-09-12 DIAGNOSIS — S22089A Unspecified fracture of T11-T12 vertebra, initial encounter for closed fracture: Principal | ICD-10-CM | POA: Diagnosis present

## 2018-09-12 DIAGNOSIS — F10929 Alcohol use, unspecified with intoxication, unspecified: Secondary | ICD-10-CM | POA: Diagnosis not present

## 2018-09-12 DIAGNOSIS — Z7901 Long term (current) use of anticoagulants: Secondary | ICD-10-CM

## 2018-09-12 DIAGNOSIS — M5489 Other dorsalgia: Secondary | ICD-10-CM | POA: Diagnosis not present

## 2018-09-12 DIAGNOSIS — Z7401 Bed confinement status: Secondary | ICD-10-CM | POA: Diagnosis not present

## 2018-09-12 DIAGNOSIS — W01198A Fall on same level from slipping, tripping and stumbling with subsequent striking against other object, initial encounter: Secondary | ICD-10-CM | POA: Diagnosis present

## 2018-09-12 DIAGNOSIS — C4352 Malignant melanoma of skin of breast: Secondary | ICD-10-CM | POA: Diagnosis not present

## 2018-09-12 DIAGNOSIS — Z9012 Acquired absence of left breast and nipple: Secondary | ICD-10-CM | POA: Diagnosis not present

## 2018-09-12 DIAGNOSIS — M4850XA Collapsed vertebra, not elsewhere classified, site unspecified, initial encounter for fracture: Secondary | ICD-10-CM | POA: Diagnosis not present

## 2018-09-12 DIAGNOSIS — S299XXA Unspecified injury of thorax, initial encounter: Secondary | ICD-10-CM | POA: Diagnosis not present

## 2018-09-12 LAB — COMPREHENSIVE METABOLIC PANEL
ALBUMIN: 3 g/dL — AB (ref 3.5–5.0)
ALK PHOS: 159 U/L — AB (ref 38–126)
ALT: 43 U/L (ref 0–44)
ANION GAP: 10 (ref 5–15)
AST: 58 U/L — AB (ref 15–41)
BUN: 10 mg/dL (ref 6–20)
CALCIUM: 8.4 mg/dL — AB (ref 8.9–10.3)
CO2: 36 mmol/L — AB (ref 22–32)
Chloride: 85 mmol/L — ABNORMAL LOW (ref 98–111)
Creatinine, Ser: 0.75 mg/dL (ref 0.44–1.00)
GFR calc Af Amer: >60 mL/min (ref >60)
GFR calc non Af Amer: >60 mL/min (ref >60)
GLUCOSE: 110 mg/dL — AB (ref 70–99)
Potassium: 2.8 mmol/L — ABNORMAL LOW (ref 3.5–5.1)
SODIUM: 131 mmol/L — AB (ref 135–145)
Total Bilirubin: 1 mg/dL (ref 0.3–1.2)
Total Protein: 6.4 g/dL — ABNORMAL LOW (ref 6.5–8.1)

## 2018-09-12 LAB — CBC
HCT: 36.7 % (ref 36.0–46.0)
Hemoglobin: 13 g/dL (ref 12.0–15.0)
MCH: 34.9 pg — ABNORMAL HIGH (ref 26.0–34.0)
MCHC: 35.4 g/dL (ref 30.0–36.0)
MCV: 98.7 fL (ref 80.0–100.0)
NRBC: 0 % (ref 0.0–0.2)
Platelets: 297 10*3/uL (ref 150–400)
RBC: 3.72 MIL/uL — AB (ref 3.87–5.11)
RDW: 15.9 % — ABNORMAL HIGH (ref 11.5–15.5)
WBC: 8.3 10*3/uL (ref 4.0–10.5)

## 2018-09-12 LAB — PROTIME-INR
INR: 1.62
Prothrombin Time: 19 seconds — ABNORMAL HIGH (ref 11.4–15.2)

## 2018-09-12 MED ORDER — MORPHINE SULFATE (PF) 4 MG/ML IV SOLN: 4.0000 mg | INTRAVENOUS | Status: DC | PRN

## 2018-09-12 MED ORDER — GABAPENTIN 300 MG PO CAPS
600.0000 mg | ORAL_CAPSULE | Freq: Four times a day (QID) | ORAL | Status: DC
  Administered 2018-09-12 – 2018-09-17 (×16): 600 mg via ORAL
  Filled 2018-09-12 (×16): qty 2

## 2018-09-12 MED ORDER — ENOXAPARIN SODIUM 40 MG/0.4ML ~~LOC~~ SOLN
40.0000 mg | SUBCUTANEOUS | Status: DC
  Administered 2018-09-12 – 2018-09-14 (×3): 40 mg via SUBCUTANEOUS
  Filled 2018-09-12 (×3): qty 0.4

## 2018-09-12 MED ORDER — MORPHINE SULFATE (PF) 2 MG/ML IV SOLN: 1.0000 mg | INTRAVENOUS | Status: DC | PRN

## 2018-09-12 MED ORDER — CLONAZEPAM 0.5 MG PO TBDP
0.5000 mg | ORAL_TABLET | Freq: Two times a day (BID) | ORAL | Status: DC
  Administered 2018-09-14 – 2018-09-17 (×6): 0.5 mg via ORAL
  Filled 2018-09-12 (×7): qty 1

## 2018-09-12 MED ORDER — DOCUSATE SODIUM 100 MG PO CAPS
100.0000 mg | ORAL_CAPSULE | Freq: Two times a day (BID) | ORAL | Status: DC
  Administered 2018-09-13 – 2018-09-15 (×5): 100 mg via ORAL
  Filled 2018-09-12 (×6): qty 1

## 2018-09-12 MED ORDER — DULOXETINE HCL 60 MG PO CPEP
60.0000 mg | ORAL_CAPSULE | Freq: Every day | ORAL | Status: DC
  Administered 2018-09-13 – 2018-09-17 (×4): 60 mg via ORAL
  Filled 2018-09-12 (×5): qty 1

## 2018-09-12 MED ORDER — ONDANSETRON HCL 4 MG/2ML IJ SOLN
4.0000 mg | Freq: Once | INTRAMUSCULAR | Status: AC
  Administered 2018-09-12: 4 mg via INTRAVENOUS
  Filled 2018-09-12: qty 2

## 2018-09-12 MED ORDER — ASPIRIN EC 81 MG PO TBEC
81.0000 mg | DELAYED_RELEASE_TABLET | Freq: Every day | ORAL | Status: DC
  Administered 2018-09-13 – 2018-09-17 (×4): 81 mg via ORAL
  Filled 2018-09-12 (×4): qty 1

## 2018-09-12 MED ORDER — HYDROCODONE-ACETAMINOPHEN 5-325 MG PO TABS
1.0000 | ORAL_TABLET | ORAL | Status: DC | PRN
  Administered 2018-09-12: 1 via ORAL
  Filled 2018-09-12: qty 1

## 2018-09-12 MED ORDER — ONDANSETRON HCL 4 MG PO TABS: 4.0000 mg | ORAL_TABLET | Freq: Four times a day (QID) | ORAL | Status: DC | PRN

## 2018-09-12 MED ORDER — KETOROLAC TROMETHAMINE 15 MG/ML IJ SOLN
15.0000 mg | Freq: Four times a day (QID) | INTRAMUSCULAR | Status: DC | PRN
  Administered 2018-09-13 – 2018-09-16 (×4): 15 mg via INTRAVENOUS
  Filled 2018-09-12 (×7): qty 1

## 2018-09-12 MED ORDER — ACETAMINOPHEN 325 MG PO TABS
650.0000 mg | ORAL_TABLET | Freq: Four times a day (QID) | ORAL | Status: DC | PRN
  Filled 2018-09-12: qty 2

## 2018-09-12 MED ORDER — POTASSIUM CHLORIDE CRYS ER 20 MEQ PO TBCR
40.0000 meq | EXTENDED_RELEASE_TABLET | ORAL | Status: AC
  Administered 2018-09-12 (×2): 40 meq via ORAL
  Filled 2018-09-12 (×2): qty 2

## 2018-09-12 MED ORDER — ANASTROZOLE 1 MG PO TABS
1.0000 mg | ORAL_TABLET | Freq: Every evening | ORAL | Status: DC
  Administered 2018-09-12 – 2018-09-15 (×4): 1 mg via ORAL
  Filled 2018-09-12 (×5): qty 1

## 2018-09-12 MED ORDER — HYDROCODONE-ACETAMINOPHEN 5-325 MG PO TABS
1.0000 | ORAL_TABLET | Freq: Four times a day (QID) | ORAL | Status: DC | PRN
  Administered 2018-09-13 – 2018-09-17 (×5): 1 via ORAL
  Filled 2018-09-12 (×6): qty 1

## 2018-09-12 MED ORDER — VITAMIN B-12 1000 MCG PO TABS
1000.0000 ug | ORAL_TABLET | Freq: Every day | ORAL | Status: DC
  Administered 2018-09-13 – 2018-09-17 (×4): 1000 ug via ORAL
  Filled 2018-09-12 (×4): qty 1

## 2018-09-12 MED ORDER — BACLOFEN 10 MG PO TABS
10.0000 mg | ORAL_TABLET | Freq: Three times a day (TID) | ORAL | Status: DC
  Administered 2018-09-12 – 2018-09-17 (×12): 10 mg via ORAL
  Filled 2018-09-12 (×17): qty 1

## 2018-09-12 MED ORDER — MORPHINE SULFATE (PF) 4 MG/ML IV SOLN
4.0000 mg | Freq: Once | INTRAVENOUS | Status: AC
  Administered 2018-09-12: 4 mg via INTRAVENOUS
  Filled 2018-09-12: qty 1

## 2018-09-12 MED ORDER — ATORVASTATIN CALCIUM 20 MG PO TABS
40.0000 mg | ORAL_TABLET | Freq: Every day | ORAL | Status: DC
  Administered 2018-09-12 – 2018-09-16 (×5): 40 mg via ORAL
  Filled 2018-09-12 (×5): qty 2

## 2018-09-12 MED ORDER — ACETAMINOPHEN 650 MG RE SUPP: 650.0000 mg | Freq: Four times a day (QID) | RECTAL | Status: DC | PRN

## 2018-09-12 MED ORDER — ONDANSETRON HCL 4 MG/2ML IJ SOLN: 4.0000 mg | Freq: Four times a day (QID) | INTRAMUSCULAR | Status: DC | PRN

## 2018-09-12 MED ORDER — POTASSIUM CHLORIDE 10 MEQ/100ML IV SOLN
10.0000 meq | INTRAVENOUS | Status: AC
  Administered 2018-09-12 (×3): 10 meq via INTRAVENOUS
  Filled 2018-09-12 (×4): qty 100

## 2018-09-12 MED ORDER — PANTOPRAZOLE SODIUM 40 MG PO TBEC
40.0000 mg | DELAYED_RELEASE_TABLET | Freq: Every day | ORAL | Status: DC
  Administered 2018-09-12 – 2018-09-17 (×5): 40 mg via ORAL
  Filled 2018-09-12 (×5): qty 1

## 2018-09-12 MED ORDER — NALOXONE HCL 0.4 MG/ML IJ SOLN
0.4000 mg | INTRAMUSCULAR | Status: DC | PRN
  Administered 2018-09-12 – 2018-09-13 (×2): 0.4 mg via INTRAVENOUS
  Filled 2018-09-12: qty 1

## 2018-09-12 MED ORDER — NALOXONE HCL 0.4 MG/ML IJ SOLN
INTRAMUSCULAR | Status: AC
  Filled 2018-09-12: qty 1

## 2018-09-12 MED ORDER — POLYETHYLENE GLYCOL 3350 17 G PO PACK: 17.0000 g | PACK | Freq: Every day | ORAL | Status: DC | PRN

## 2018-09-12 NOTE — H&P (Signed)
[facility] [place] NAME: Tina Sharp    MR#:  [id number]  DATE OF BIRTH:  12/24/1965  DATE OF ADMISSION:  09/12/2018  PRIMARY CARE PHYSICIAN: Kirk Ruths, MD   REQUESTING/REFERRING PHYSICIAN: Dr. Lavonia Drafts  CHIEF COMPLAINT:   Chief Complaint  Patient presents with  . Fall    HISTORY OF PRESENT ILLNESS:  Tina Sharp  is a 53 y.o. 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 Patient is a poor historian.  No family in the room at this time.  She mentions that she has been living with her mom.  She ambulates with a walker at baseline.  Has unsteady gait due to her prior strokes.  She had strokes which affected both her right and left side much weaker on the right.  She feels like in the last week her right leg is giving up more and she did have quite a few falls.  Yesterday when she was walking from the bathroom, she had a mechanical fall and since then has been having back pain.  Denies any fever or chills.  Did hit her head but did not lose consciousness.  No recent travel or illnesses. Work-up in the ED indicated mild dehydration and hypokalemia on the labs, INR was 1.6 -CT of the head with chronic changes and a small left parietal hematoma.  CT of the lumbar spine indicating moderate compression fracture of T12 vertebral body. -She is being admitted for pain control  PAST MEDICAL HISTORY:   Past Medical History:  Diagnosis Date  . Anxiety   . Breast cancer ([place]) 01/2014   Invasive lobular carcinoma, 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 ER/ PR+; Her 2 neu not overexpressed, microscopic positive margin (skin). Oncotype DX, low risk for recurrence.  . Depression   . Genetic screening 05/05/2014   negative /[facility]  . GERD (gastroesophageal reflux  disease)   . Hypertension   . 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 ABDOMINAL HYSTERECTOMY  2000   left both ovaries in  . BREAST SURGERY Left 04/08/14   mastectomy  . COLONOSCOPY  06/30/2008   Lucilla Lame, MD; chronic diarrhea. Negative ileal and colon biopsies.   . EP IMPLANTABLE DEVICE N/A 06/14/2015   Procedure: Loop Recorder Insertion;  Surgeon: Evans Lance, MD;  Location: [facility];  Service: Cardiovascular;  Laterality: N/A;  . HIP ARTHROPLASTY Right 08/01/2016   Procedure: ARTHROPLASTY BIPOLAR HIP (HEMIARTHROPLASTY);  Surgeon: Dereck Leep, MD;  Location: [facility] ORS;  Service: Orthopedics;  Laterality: Right;  . LAPAROSCOPIC HYSTERECTOMY  2010   [facility]  . MASTECTOMY Left 2016  . REDUCTION MAMMAPLASTY Right 2016   Lift  . reduction mammoplasty Right 04/08/14   Dr Tula Nakayama  . TEE WITHOUT CARDIOVERSION N/A 06/14/2015   Procedure: TRANSESOPHAGEAL ECHOCARDIOGRAM (TEE);  Surgeon: Josue Hector, MD;  Location: [facility] ENDOSCOPY;  Service: Cardiovascular;  Laterality: N/A;  . TONSILLECTOMY AND ADENOIDECTOMY     pt was 53 years old    SOCIAL HISTORY:   Social History   Tobacco Use  . Smoking status: Former Smoker    Packs/day: 0.25    Types: Cigarettes  Last attempt to quit: 10/12/2015    Years since quitting: 2.9  . Smokeless tobacco: Never Used  . Tobacco comment: E cig  Substance Use Topics  . Alcohol use: No    Alcohol/week: 3.0 standard drinks    Types: 1 Glasses of wine, 1 Cans of beer, 1 Shots of liquor per week    Comment: social    FAMILY HISTORY:   Family History  Problem Relation Age of Onset  . Breast cancer Mother 28  . Ovarian cancer Other   . Stroke Paternal Grandmother   . Breast cancer Maternal Grandmother 54  . Cancer Maternal Grandfather 51       prostate  . Prostate cancer Maternal Grandfather     DRUG  ALLERGIES:  No Known Allergies  REVIEW OF SYSTEMS:   Review of Systems  Constitutional: Positive for malaise/fatigue. Negative for chills, fever and weight loss.  HENT: Negative for ear discharge, ear pain, hearing loss, nosebleeds and tinnitus.   Eyes: Negative for blurred vision, double vision and photophobia.  Respiratory: Negative for cough, hemoptysis, shortness of breath and wheezing.   Cardiovascular: Negative for chest pain, palpitations, orthopnea and leg swelling.  Gastrointestinal: Negative for abdominal pain, constipation, diarrhea, heartburn, melena, nausea and vomiting.  Genitourinary: Negative for dysuria, frequency, hematuria and urgency.  Musculoskeletal: Positive for back pain, falls and myalgias. Negative for neck pain.  Skin: Negative for rash.  Neurological: Positive for dizziness, sensory change and focal weakness. Negative for tingling, tremors, speech change and headaches.  Endo/Heme/Allergies: Does not bruise/bleed easily.  Psychiatric/Behavioral: Negative for depression.    MEDICATIONS AT HOME:   Prior to Admission medications   Medication Sig Start Date End Date Taking? Authorizing Provider  anastrozole (ARIMIDEX) 1 MG tablet Take 1 tablet (1 mg total) by mouth every evening. 06/15/15  Yes Cammie Sickle, MD  atorvastatin (LIPITOR) 40 MG tablet Take 1 tablet (40 mg total) by mouth daily at 6 PM. 11/09/15  Yes Gladstone Lighter, MD  baclofen (LIORESAL) 10 MG tablet Take 10 mg by mouth 3 (three) times daily.   Yes [provider]  clonazePAM (KLONOPIN) 0.5 MG disintegrating tablet Take 0.5 mg by mouth 2 (two) times daily. 07/28/16  Yes [provider]  cyanocobalamin 1000 MCG tablet Take 1 tablet (1,000 mcg total) by mouth daily. 11/09/15  Yes Gladstone Lighter, MD  DULoxetine (CYMBALTA) 60 MG capsule Take 60 mg by mouth daily. 07/21/16  Yes [provider]  gabapentin (NEURONTIN) 600 MG tablet Take 600 mg by mouth 4 (four) times  daily. 07/22/16  Yes [provider]  omeprazole (PRILOSEC) 20 MG capsule Take 20 mg by mouth as needed (heart burn).    Yes [provider]  potassium chloride (K-DUR) 10 MEQ tablet Take 10 mEq by mouth daily. 08/20/18  Yes [provider]  prochlorperazine (COMPAZINE) 5 MG tablet Take 5 mg by mouth every 6 (six) hours as needed for nausea or vomiting.   Yes [provider]  torsemide (DEMADEX) 20 MG tablet Take 20 mg by mouth daily.  06/17/17 09/12/18 Yes [provider]  warfarin (COUMADIN) 5 MG tablet Take 2.5-5 mg by mouth daily. Take 2.5 mg for 3 days, then take 5 mg until returning for lab draw. 07/26/16  Yes [provider]      VITAL SIGNS:  Blood pressure (!) 140/99, pulse 89, temperature 98.4 F (36.9 C), temperature source Oral, resp. rate 18, height 5\' 2"  (1.575 m), weight 63.5 kg, SpO2 98 %.  PHYSICAL EXAMINATION:   Physical Exam  GENERAL:  53 y.o.-year-old patient lying in the bed and appears to be in significant distress due to pain.  EYES: Pupils equal, round, reactive to light and accommodation. No scleral icterus. Extraocular muscles intact.  HEENT: Head atraumatic, normocephalic. Oropharynx and nasopharynx clear.  NECK:  Supple, no jugular venous distention. No thyroid enlargement, no tenderness.  LUNGS: Normal breath sounds bilaterally, no wheezing, rales,rhonchi or crepitation. No use of accessory muscles of respiration.  Decreased bibasilar breath sounds CARDIOVASCULAR: S1, S2 normal. No murmurs, rubs, or gallops.  ABDOMEN: Soft, nontender, nondistended. Bowel sounds present. No organomegaly or mass.  EXTREMITIES: No pedal edema, cyanosis, or clubbing.  NEUROLOGIC: Cranial nerves II through XII are intact. Muscle strength equal in all extremities and mildly decreased in the right lower extremity.  Sensation intact. Gait not checked.  PSYCHIATRIC: The patient is alert and oriented x 3.  SKIN: No obvious rash, lesion,  or ulcer.   LABORATORY PANEL:   CBC Recent Labs  Lab 09/12/18 1054  WBC 8.3  HGB 13.0  HCT 36.7  PLT 297   ------------------------------------------------------------------------------------------------------------------  Chemistries  Recent Labs  Lab 09/12/18 1054  NA 131*  K 2.8*  CL 85*  CO2 36*  GLUCOSE 110*  BUN 10  CREATININE 0.75  CALCIUM 8.4*  AST 58*  ALT 43  ALKPHOS 159*  BILITOT 1.0   ------------------------------------------------------------------------------------------------------------------  Cardiac Enzymes No results for input(s): TROPONINI in the last 168 hours. ------------------------------------------------------------------------------------------------------------------  RADIOLOGY:  Dg Thoracic Spine 2 View  Result Date: 09/12/2018 CLINICAL DATA:  Acute upper back pain after fall. EXAM: THORACIC SPINE 2 VIEWS COMPARISON:  Radiographs of June 21, 2015. FINDINGS: Severe compression deformity is seen involving L1 vertebral body concerning for acute compression fracture. Thoracic spine is unremarkable. No significant degenerative changes are noted. IMPRESSION: Severe compression deformity of L1 vertebral body is noted concerning for acute compression fracture. Electronically Signed   By: Marijo Conception, M.D.   On: 09/12/2018 11:48   Dg Lumbar Spine 2-3 Views  Result Date: 09/12/2018 CLINICAL DATA:  Low back pain status post fall. Fell in the bathroom. EXAM: LUMBAR SPINE - 2-3 VIEW COMPARISON:  MR lumbar spine 11/04/2015 FINDINGS: There are 5 nonrib bearing lumbar-type vertebral bodies. T12 vertebral body compression fracture with approximately 60% height loss. There is relative kyphosis centered at T12. The alignment is anatomic. There is no static listhesis. There is no spondylolysis. There is no acute fracture. The disc spaces are maintained. The SI joints are unremarkable. IMPRESSION: T12 vertebral body compression fracture with approximately  60% height loss. This is new compared with 11/04/2015. Electronically Signed   By: Kathreen Devoid   On: 09/12/2018 11:46   Ct Head Wo Contrast  Result Date: 09/12/2018 CLINICAL DATA:  Headache after fall.  On Coumadin. EXAM: CT HEAD WITHOUT CONTRAST TECHNIQUE: Contiguous axial images were obtained from the base of the skull through the vertex without intravenous contrast. COMPARISON:  CT head dated February 21, 2016. FINDINGS: Brain: No evidence of acute infarction, hemorrhage, hydrocephalus, extra-axial collection or mass lesion/mass effect. Unchanged chronic infarcts in the right frontoparietal region and right occipital region. Stable mild atrophy. Vascular: No hyperdense vessel or unexpected calcification. Skull: Negative for fracture or focal lesion. Sinuses/Orbits: No acute finding. Other: Small left parietal scalp hematoma. IMPRESSION: 1. No acute intracranial abnormality. Small left parietal scalp hematoma. 2. Unchanged chronic infarcts. Electronically Signed   By: Titus Dubin M.D.   On: 09/12/2018 11:54   Ct Lumbar  Spine Wo Contrast  Result Date: 09/12/2018 CLINICAL DATA:  T12 compression fracture.  Multiple falls EXAM: CT LUMBAR SPINE WITHOUT CONTRAST TECHNIQUE: Multidetector CT imaging of the lumbar spine was performed without intravenous contrast administration. Multiplanar CT image reconstructions were also generated. COMPARISON:  Lumbar radiographs 09/12/2018, MRI 11/04/2015 FINDINGS: Segmentation: Normal Alignment: Normal Vertebrae: Moderate compression fracture T12 vertebral body. Compression superior endplate. Gas is present within the horizontal fracture line. This appears to be an acute fracture. Retropulsion of bone into the spinal canal narrowing the canal by 50% diameter stenosis. No other fracture or mass lesion. Paraspinal and other soft tissues: Negative for paraspinous mass. Mild paraspinous hematoma around the T12 fracture. Disc levels: T12-L1: Mild to moderate spinal stenosis due to  retropulsion of T12 fracture into the canal T12-L1: Negative L1-2: Negative L2-3: Negative L3-4: Diffuse disc bulging and mild facet degeneration. No significant stenosis. L4-5: Mild disc bulging and mild facet degeneration. Right laminectomy. Negative for stenosis. L5-S1: Mild disc and facet degeneration.  Negative for stenosis. IMPRESSION: Moderate compression fracture T12 vertebral body appears acute. No fracture in the posterior elements. Bony retropulsion into the canal narrowing the canal by 50% diameter stenosis. Mild paraspinal hematoma at T12. No other fracture Mild lumbar degenerative change without stenosis. Electronically Signed   By: Franchot Gallo M.D.   On: 09/12/2018 12:56    EKG:   Orders placed or performed in visit on 01/15/17  . EKG 12-Lead    IMPRESSION AND PLAN:   Tina Sharp  is a 53 y.o. 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  1.  Fall and T12 compression fracture-has been having falls lately, will admit, pain control -Physical therapy evaluation -We will consult orthopedics to see if she would be a candidate for kyphoplasty if Coumadin is held.  2.  History of cryptogenic strokes-last stroke was a few years ago, INR is subtherapeutic -Patient on Coumadin.  Will hold it for today to see if she would need kyphoplasty -Reinstate Coumadin and adjust and outpatient follow-up for therapeutic INR -On statin.    3.  Neuropathy-continue home medications  4.  History of breast cancer-status post surgery, chemoradiation.  Continue hormonal treatment  5.  Hypokalemia and hyponatremia-hold torsemide.  Gentle hydration and monitor  6.  DVT prophylaxis-warfarin will be started after kyphoplasty if indicated.  Awaiting to hear from Ortho  Physical therapy consult   All the records are  reviewed and case discussed with ED provider. Management plans discussed with the patient, family and they are in agreement.  CODE STATUS: Full Code  TOTAL TIME TAKING CARE OF THIS PATIENT: 51 minutes.    Gladstone Lighter M.D on 09/12/2018 at 2:42 PM  Between 7am to 6pm - Pager - [phone]  After 6pm go to [email or web address] - password [email or web address] [facility]  Office  [phone]  CC: Primary care physician; Kirk Ruths, MD

## 2018-09-12 NOTE — ED Provider Notes (Signed)
[facility] Emergency Department Provider Note   ____________________________________________    I have reviewed the triage vital signs and the nursing notes.   HISTORY  Chief Complaint Fall     HPI Tina Sharp is a 53 y.o. female with a history of CVA who presents after a fall.  Patient reports she was trying to get off of the toilet but her walker slipped and she fell backwards possibly striking her head against the bathtub.  No LOC.  Some lateral posterior right-sided neck pain.  Does complain of moderate aching to her entire back.  No abdominal pain.  No chest pain.  No lower extremity or upper extremity injuries.  On Coumadin.  She complains of a mild headache and swelling to the posterior scalp  Past Medical History:  Diagnosis Date  . Anxiety   . Breast cancer ([place]) 01/2014   Invasive lobular carcinoma, 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 ER/ PR+; Her 2 neu not overexpressed, microscopic positive margin (skin). Oncotype DX, low risk for recurrence.  . Depression   . Genetic screening 05/05/2014   negative /[facility]  . GERD (gastroesophageal reflux disease)   . Hypertension   . 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 Lymphedema 04/30/2018  . Hypertension 01/14/2018  . Leg swelling 01/14/2018  . Breast calcifications on mammogram 06/24/2017  . Hip fracture ([place]) 07/30/2016  . 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 Rotator cuff tendinitis, left 06/15/2016  . Neuropathy 01/03/2016  . Ataxia 01/03/2016  . Myalgia 09/08/2015  . Depression 09/06/2015  . Cerebrovascular accident (CVA) due to thrombosis of posterior cerebral artery ([place]) 08/18/2015  . 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 Stroke with cerebral ischemia ([place]) 07/12/2015  . HLD (hyperlipidemia) 07/12/2015  . CVA (cerebral infarction) 06/21/2015  . TIA (transient  ischemic attack) 06/21/2015  . Macrocytic anemia 06/20/2015  . Headache 06/14/2015  . Cigarette smoker 06/14/2015  . Stroke (cerebrum) ([place]) cryptogenic R brain infarcts s/p IV tPA  06/11/2015  . 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, estrogen receptor positive ([place]) 04/23/2014    Past Surgical History:  Procedure Laterality Date  . ABDOMINAL HYSTERECTOMY  2000   left both ovaries in  . BREAST SURGERY Left 04/08/14   mastectomy  . COLONOSCOPY  06/30/2008   Lucilla Lame, MD; chronic diarrhea. Negative ileal and colon biopsies.   . EP IMPLANTABLE DEVICE N/A 06/14/2015   Procedure: Loop Recorder Insertion;  Surgeon: Evans Lance, MD;  Location: [facility];  Service: Cardiovascular;  Laterality: N/A;  . HIP ARTHROPLASTY Right 08/01/2016   Procedure: ARTHROPLASTY BIPOLAR HIP (HEMIARTHROPLASTY);  Surgeon: Dereck Leep, MD;  Location: [facility] ORS;  Service: Orthopedics;  Laterality: Right;  . LAPAROSCOPIC HYSTERECTOMY  2010   [facility]  . MASTECTOMY Left 2016  . REDUCTION MAMMAPLASTY Right 2016   Lift  . reduction mammoplasty Right 04/08/14   Dr Tula Nakayama  . TEE WITHOUT CARDIOVERSION N/A 06/14/2015   Procedure: TRANSESOPHAGEAL ECHOCARDIOGRAM (TEE);  Surgeon: Josue Hector, MD;  Location: [facility] ENDOSCOPY;  Service: Cardiovascular;  Laterality: N/A;  . TONSILLECTOMY AND ADENOIDECTOMY     pt was 53 years old    Prior to Admission medications   Medication Sig Start Date End Date Taking? Authorizing Provider  anastrozole (ARIMIDEX) 1 MG tablet Take 1 tablet (1 mg total) by mouth every evening. 06/15/15  Cammie Sickle, MD  aspirin EC 81 MG tablet Take by mouth.    [provider]  atorvastatin (LIPITOR) 40 MG tablet Take 1 tablet (40 mg total) by mouth daily at 6 PM. 11/09/15   Gladstone Lighter, MD  b complex vitamins tablet Take 1 tablet by mouth daily. 11/09/15   Gladstone Lighter, MD  baclofen (LIORESAL) 10 MG tablet Take 10 mg by mouth 3 (three) times  daily.    [provider]  clonazePAM (KLONOPIN) 0.5 MG disintegrating tablet Take 0.5 mg by mouth 2 (two) times daily. 07/28/16   [provider]  cyanocobalamin 1000 MCG tablet Take 1 tablet (1,000 mcg total) by mouth daily. 11/09/15   Gladstone Lighter, MD  DULoxetine (CYMBALTA) 60 MG capsule Take 60 mg by mouth daily. 07/21/16   [provider]  folic acid (FOLVITE) 1 MG tablet Take 1 tablet (1 mg total) by mouth daily. 06/22/15   Kelvin Cellar, MD  gabapentin (NEURONTIN) 600 MG tablet Take 600 mg by mouth 4 (four) times daily. 07/22/16   [provider]  omeprazole (PRILOSEC) 20 MG capsule Take 20 mg by mouth as needed (heart burn).     [provider]  prochlorperazine (COMPAZINE) 5 MG tablet Take 5 mg by mouth every 6 (six) hours as needed for nausea or vomiting.    [provider]  pyridOXINE (B-6) 50 MG tablet Take by mouth.    [provider]  torsemide (DEMADEX) 20 MG tablet Take by mouth. 06/17/17 06/17/18  [provider]  warfarin (COUMADIN) 5 MG tablet Take 2.5-5 mg by mouth daily. Take 2.5 mg for 3 days, then take 5 mg until returning for lab draw. 07/26/16   [provider]     Allergies Patient has no known allergies.  Family History  Problem Relation Age of Onset  . Breast cancer Mother 18  . Ovarian cancer Other   . Stroke Paternal Grandmother   . Breast cancer Maternal Grandmother 4  . Cancer Maternal Grandfather 62       prostate  . Prostate cancer Maternal Grandfather     Social History Social History   Tobacco Use  . Smoking status: Former Smoker    Packs/day: 0.25    Types: Cigarettes    Last attempt to quit: 10/12/2015    Years since quitting: 2.9  . Smokeless tobacco: Never Used  . Tobacco comment: E cig  Substance Use Topics  . Alcohol use: No    Alcohol/week: 3.0 standard drinks    Types: 1 Glasses of wine, 1 Cans of beer, 1 Shots of liquor per week    Comment: social    . Drug use: No    Review of Systems  Constitutional: No dizziness Eyes: No visual changes.  ENT: As above Cardiovascular: Denies chest pain. Respiratory: Denies shortness of breath. Gastrointestinal: No abdominal pain.  No nausea, no vomiting.   Genitourinary: No groin injury Musculoskeletal: As above Skin: Old bruises to the left arm Neurological: Negative for new weakness   ____________________________________________   PHYSICAL EXAM:  VITAL SIGNS: ED Triage Vitals  Enc Vitals Group     BP 09/12/18 1051 121/74     Pulse Rate 09/12/18 1051 (!) 104     Resp 09/12/18 1051 18     Temp 09/12/18 1051 98.4 F (36.9 C)     Temp Source 09/12/18 1051 Oral     SpO2 09/12/18 1051 98 %     Weight 09/12/18 1052 63.5 kg (140  lb)     Height 09/12/18 1052 1.575 m (5\' 2" )     Head Circumference --      Peak Flow --      Pain Score 09/12/18 1052 8     Pain Loc --      Pain Edu? --      Excl. in [place]? --     Constitutional: Alert and oriented.  Eyes: Conjunctivae are normal.  Head: Hematoma posterior central scalp Nose: No congestion/rhinnorhea. Mouth/Throat: Mucous membranes are moist.   Neck:  Painless ROM, no vertebral tenderness palpation, no pain with axial load Cardiovascular: Normal rate, regular rhythm.  Good peripheral circulation.  No chest wall tenderness to palpation Respiratory: Normal respiratory effort.  No retractions.  Gastrointestinal: Soft and nontender. No distention.    Musculoskeletal:.  Warm and well perfused.  No pain with axial load on both lower extremities to suggest hip injury.  No tenderness to palpation of the pelvis or any extremities.  No tenderness overlying the collarbones.  Back: Some mild tenderness to palpation over approximately L2 although no bony abnormalities or swelling Neurologic:  Normal speech and language. Skin:  Skin is warm, dry and intact.  Psychiatric: Mood and affect are normal. Speech and behavior are  normal.  ____________________________________________   LABS (all labs ordered are listed, but only abnormal results are displayed)  Labs Reviewed  CBC - Abnormal; Notable for the following components:      Result Value   RBC 3.72 (*)    MCH 34.9 (*)    RDW 15.9 (*)    All other components within normal limits  COMPREHENSIVE METABOLIC PANEL - Abnormal; Notable for the following components:   Sodium 131 (*)    Potassium 2.8 (*)    Chloride 85 (*)    CO2 36 (*)    Glucose, Bld 110 (*)    Calcium 8.4 (*)    Total Protein 6.4 (*)    Albumin 3.0 (*)    AST 58 (*)    Alkaline Phosphatase 159 (*)    All other components within normal limits  PROTIME-INR - Abnormal; Notable for the following components:   Prothrombin Time 19.0 (*)    All other components within normal limits   ____________________________________________  EKG  None ____________________________________________  RADIOLOGY  CT lumbar spine ____________________________________________   PROCEDURES  Procedure(s) performed: No  Procedures   Critical Care performed: No ____________________________________________   INITIAL IMPRESSION / ASSESSMENT AND PLAN / ED COURSE  Pertinent labs & imaging results that were available during my care of the patient were reviewed by me and considered in my medical decision making (see chart for details).  Patient overall well-appearing and in no acute distress, complains primarily of posterior scalp pain where she has a hematoma as well as essentially diffuse back pain.  We will obtain CT head, L and T-spine x-rays, check INR.  We will give IV morphine IV Zofran for pain  ----------------------------------------- 1:20 PM on 09/12/2018 -----------------------------------------  CT lumbar spine demonstrates moderate T12 compression fracture with 50% stenosis of the canal.  No weakness, numbness or change in sensation in the lower extremities.  Normal strength, normal  plantar and dorsiflexion.  No incontinence  Discussed with hospitalist for admission for pain control.  Family is also concerned because patient seems to have had worsened balance recently and they are concerned about another CVA    ____________________________________________   FINAL CLINICAL IMPRESSION(S) / ED DIAGNOSES  Final diagnoses:  Compression fracture of body of  thoracic vertebra [facility])        Note:  This document was prepared using Dragon voice recognition software and may include unintentional dictation errors.   Lavonia Drafts, MD 09/12/18 1321

## 2018-09-12 NOTE — Progress Notes (Signed)
   09/12/18 2300  Clinical Encounter Type  Visited With Patient  Visit Type Initial  Spiritual Encounters  Spiritual Needs Prayer  Stress Factors  Patient Stress Factors Health changes  Ch responded to rapid response. Pt was a little drowsy because of the sedatives but interactive. After the medical team was done with the treatment, ch stayed and prayed for her and her mother who is fighting breast cancer. Ch left as pt tried to go back to sleep.

## 2018-09-12 NOTE — ED Notes (Signed)
ED TO INPATIENT HANDOFF REPORT  ED Nurse Name and Phone #: Anderson Malta [phone]  Name/Age/Gender Tina Sharp 53 y.o. female Room/Bed: ED17A/ED17A  Code Status   Code Status: Prior  Home/SNF/Other Home Patient oriented to: self, place, time and situation Is this baseline? Yes   Triage Complete: Triage complete  Chief Complaint fall  Triage Note Patient presents to the ED via EMS from home post fall.  Patient states she tripped and "lost strength in her legs" and fell in the bathroom.  Patient states she hit her head and "jarred her back".  Patient is complaining of headache and back pain.  Patient has a large hematoma to her head.  Patient states this is her 5th fall in the past 2 weeks after her PT ended.  Patient was in PT for weakness and falling post stroke 3 years ago.  Patient takes coumadin.     Allergies No Known Allergies  Level of Care/Admitting Diagnosis ED Disposition    ED Disposition Condition [facility] Area: [place] [[id number]]  Level of Care: Med-Surg [16]  Diagnosis: Fall [290176]  Admitting Physician: Gladstone Lighter [[id number]]  Attending Physician: Gladstone Lighter [[id number]]  Estimated length of stay: past midnight tomorrow  Certification:: I certify this patient will need inpatient services for at least 2 midnights  PT Class (Do Not Modify): Inpatient [101]  PT Acc Code (Do Not Modify): Private [1]       Medical/Surgery History Past Medical History:  Diagnosis Date  . Anxiety   . Breast cancer ([place]) 01/2014   Invasive lobular carcinoma, 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 ER/ PR+; Her 2 neu not overexpressed, microscopic positive margin (skin). Oncotype DX, low risk for recurrence.  . Depression   . Genetic screening 05/05/2014   negative /[facility]  . GERD (gastroesophageal reflux disease)   . Hypertension   . 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 ABDOMINAL HYSTERECTOMY  2000   left both ovaries in  . BREAST SURGERY Left 04/08/14   mastectomy  . COLONOSCOPY  06/30/2008   Lucilla Lame, MD; chronic diarrhea. Negative ileal and colon biopsies.   . EP IMPLANTABLE DEVICE N/A 06/14/2015   Procedure: Loop Recorder Insertion;  Surgeon: Evans Lance, MD;  Location: [facility];  Service: Cardiovascular;  Laterality: N/A;  . HIP ARTHROPLASTY Right 08/01/2016   Procedure: ARTHROPLASTY BIPOLAR HIP (HEMIARTHROPLASTY);  Surgeon: Dereck Leep, MD;  Location: [facility] ORS;  Service: Orthopedics;  Laterality: Right;  . LAPAROSCOPIC HYSTERECTOMY  2010   [facility]  . MASTECTOMY Left 2016  . REDUCTION MAMMAPLASTY Right 2016   Lift  . reduction mammoplasty Right 04/08/14   Dr Tula Nakayama  . TEE WITHOUT CARDIOVERSION N/A 06/14/2015   Procedure: TRANSESOPHAGEAL ECHOCARDIOGRAM (TEE);  Surgeon: Josue Hector, MD;  Location[facility] ENDOSCOPY;  Service: Cardiovascular;  Laterality: N/A;  . TONSILLECTOMY AND ADENOIDECTOMY     pt was 53 years old     IV Location/Drains/Wounds Patient Lines/Drains/Airways Status   Active Line/Drains/Airways    Name:   Placement date:   Placement time:   Site:   Days:   Peripheral IV 09/12/18 Right Antecubital   09/12/18    1102    Antecubital   less than 1   Closed System Drain 1 Right Hip Accordion (Hemovac)   08/01/16    2126    Hip   772  Incision (Closed) 08/01/16 Hip Right   08/01/16    1938     772          Intake/Output Last 24 hours No intake or output data in the 24 hours ending 09/12/18 1423  Labs/Imaging Results for orders placed or performed during the hospital encounter of 09/12/18 (from the past 48 hour(s))  CBC     Status: Abnormal   Collection Time: 09/12/18 10:54 AM  Result Value Ref Range   WBC 8.3 4.0 - 10.5 K/uL   RBC 3.72 (L) 3.87 - 5.11 MIL/uL   Hemoglobin 13.0 12.0 - 15.0 g/dL   HCT 36.7 36.0 - 46.0 %   MCV 98.7 80.0 - 100.0 fL   MCH 34.9 (H) 26.0  - 34.0 pg   MCHC 35.4 30.0 - 36.0 g/dL   RDW 15.9 (H) 11.5 - 15.5 %   Platelets 297 150 - 400 K/uL   nRBC 0.0 0.0 - 0.2 %    Comment: Performed at [facility], [address] [place], [place] [address]  Comprehensive metabolic panel     Status: Abnormal   Collection Time: 09/12/18 10:54 AM  Result Value Ref Range   Sodium 131 (L) 135 - 145 mmol/L   Potassium 2.8 (L) 3.5 - 5.1 mmol/L   Chloride 85 (L) 98 - 111 mmol/L   CO2 36 (H) 22 - 32 mmol/L   Glucose, Bld 110 (H) 70 - 99 mg/dL   BUN 10 6 - 20 mg/dL   Creatinine, Ser 0.75 0.44 - 1.00 mg/dL   Calcium 8.4 (L) 8.9 - 10.3 mg/dL   Total Protein 6.4 (L) 6.5 - 8.1 g/dL   Albumin 3.0 (L) 3.5 - 5.0 g/dL   AST 58 (H) 15 - 41 U/L   ALT 43 0 - 44 U/L   Alkaline Phosphatase 159 (H) 38 - 126 U/L   Total Bilirubin 1.0 0.3 - 1.2 mg/dL   GFR calc non Af Amer >60 >60 mL/min   GFR calc Af Amer >60 >60 mL/min   Anion gap 10 5 - 15    Comment: Performed at [facility], [address] [place], [place] [address]  Protime-INR     Status: Abnormal   Collection Time: 09/12/18 10:54 AM  Result Value Ref Range   Prothrombin Time 19.0 (H) 11.4 - 15.2 seconds   INR 1.62     Comment: Performed at [facility], [address], [place], [place] [address]   Dg Thoracic Spine 2 View  Result Date: 09/12/2018 CLINICAL DATA:  Acute upper back pain after fall. EXAM: THORACIC SPINE 2 VIEWS COMPARISON:  Radiographs of June 21, 2015. FINDINGS: Severe compression deformity is seen involving L1 vertebral body concerning for acute compression fracture. Thoracic spine is unremarkable. No significant degenerative changes are noted. IMPRESSION: Severe compression deformity of L1 vertebral body is noted concerning for acute compression fracture. Electronically Signed   By: Marijo Conception, M.D.   On: 09/12/2018 11:48   Dg Lumbar Spine 2-3 Views  Result Date: 09/12/2018 CLINICAL DATA:  Low back pain status post fall. Fell in the  bathroom. EXAM: LUMBAR SPINE - 2-3 VIEW COMPARISON:  MR lumbar spine 11/04/2015 FINDINGS: There are 5 nonrib bearing lumbar-type vertebral bodies. T12 vertebral body compression fracture with approximately 60% height loss. There is relative kyphosis centered at T12. The alignment is anatomic. There is no static listhesis. There is no spondylolysis. There is no acute fracture. The disc spaces are maintained. The SI joints  are unremarkable. IMPRESSION: T12 vertebral body compression fracture with approximately 60% height loss. This is new compared with 11/04/2015. Electronically Signed   By: Kathreen Devoid   On: 09/12/2018 11:46   Ct Head Wo Contrast  Result Date: 09/12/2018 CLINICAL DATA:  Headache after fall.  On Coumadin. EXAM: CT HEAD WITHOUT CONTRAST TECHNIQUE: Contiguous axial images were obtained from the base of the skull through the vertex without intravenous contrast. COMPARISON:  CT head dated February 21, 2016. FINDINGS: Brain: No evidence of acute infarction, hemorrhage, hydrocephalus, extra-axial collection or mass lesion/mass effect. Unchanged chronic infarcts in the right frontoparietal region and right occipital region. Stable mild atrophy. Vascular: No hyperdense vessel or unexpected calcification. Skull: Negative for fracture or focal lesion. Sinuses/Orbits: No acute finding. Other: Small left parietal scalp hematoma. IMPRESSION: 1. No acute intracranial abnormality. Small left parietal scalp hematoma. 2. Unchanged chronic infarcts. Electronically Signed   By: Titus Dubin M.D.   On: 09/12/2018 11:54   Ct Lumbar Spine Wo Contrast  Result Date: 09/12/2018 CLINICAL DATA:  T12 compression fracture.  Multiple falls EXAM: CT LUMBAR SPINE WITHOUT CONTRAST TECHNIQUE: Multidetector CT imaging of the lumbar spine was performed without intravenous contrast administration. Multiplanar CT image reconstructions were also generated. COMPARISON:  Lumbar radiographs 09/12/2018, MRI 11/04/2015 FINDINGS:  Segmentation: Normal Alignment: Normal Vertebrae: Moderate compression fracture T12 vertebral body. Compression superior endplate. Gas is present within the horizontal fracture line. This appears to be an acute fracture. Retropulsion of bone into the spinal canal narrowing the canal by 50% diameter stenosis. No other fracture or mass lesion. Paraspinal and other soft tissues: Negative for paraspinous mass. Mild paraspinous hematoma around the T12 fracture. Disc levels: T12-L1: Mild to moderate spinal stenosis due to retropulsion of T12 fracture into the canal T12-L1: Negative L1-2: Negative L2-3: Negative L3-4: Diffuse disc bulging and mild facet degeneration. No significant stenosis. L4-5: Mild disc bulging and mild facet degeneration. Right laminectomy. Negative for stenosis. L5-S1: Mild disc and facet degeneration.  Negative for stenosis. IMPRESSION: Moderate compression fracture T12 vertebral body appears acute. No fracture in the posterior elements. Bony retropulsion into the canal narrowing the canal by 50% diameter stenosis. Mild paraspinal hematoma at T12. No other fracture Mild lumbar degenerative change without stenosis. Electronically Signed   By: Franchot Gallo M.D.   On: 09/12/2018 12:56    Pending Labs [employer] (From admission, onward)    Start     Ordered   Signed and Held  HIV antibody (Routine Testing)  Once,   R     Signed and Held   Signed and Occupational hygienist morning,   R     Signed and Held   Signed and Held  CBC  Tomorrow morning,   R     Signed and Held          Vitals/Pain Today's Vitals   09/12/18 1200 09/12/18 1212 09/12/18 1300 09/12/18 1400  BP: 122/72  (!) 152/95 (!) 151/97  Pulse: 97  95   Resp: 15  13 14   Temp:      TempSrc:      SpO2: (!) 79%  99%   Weight:      Height:      PainSc:  3       Isolation Precautions No active isolations  Medications Medications  morphine 4 MG/ML injection 4 mg (has no administration in time  range)  potassium chloride SA (K-DUR,KLOR-CON) CR tablet 40 mEq (has no administration in time  range)  potassium chloride 10 mEq in 100 mL IVPB (has no administration in time range)  morphine 4 MG/ML injection 4 mg (4 mg Intravenous Given 09/12/18 1148)  ondansetron (ZOFRAN) injection 4 mg (4 mg Intravenous Given 09/12/18 1148)    Mobility walks with device High fall risk       Recommendations: See Admitting Provider Note  Report given to:   Additional Notes:  Multiple falls in last month, lives at home with aging mother.  May need placement prior to d/c.

## 2018-09-12 NOTE — ED Triage Notes (Signed)
Patient presents to the ED via EMS from home post fall.  Patient states she tripped and "lost strength in her legs" and fell in the bathroom.  Patient states she hit her head and "jarred her back".  Patient is complaining of headache and back pain.  Patient has a large hematoma to her head.  Patient states this is her 5th fall in the past 2 weeks after her PT ended.  Patient was in PT for weakness and falling post stroke 3 years ago.  Patient takes coumadin.

## 2018-09-12 NOTE — Progress Notes (Signed)
Discussed with Ortho-no plans for kyphoplasty up until Tuesday, February 4 if INR stays <1.3 and if pain not improved. -We will start aspirin and Lovenox for DVT prophylaxis for now.  Recheck INR on Monday.

## 2018-09-12 NOTE — ED Notes (Signed)
Patient transported to CT 

## 2018-09-13 LAB — CBC
HCT: 36.8 % (ref 36.0–46.0)
Hemoglobin: 12.5 g/dL (ref 12.0–15.0)
MCH: 34.6 pg — ABNORMAL HIGH (ref 26.0–34.0)
MCHC: 34 g/dL (ref 30.0–36.0)
MCV: 101.9 fL — ABNORMAL HIGH (ref 80.0–100.0)
NRBC: 0 % (ref 0.0–0.2)
Platelets: 260 10*3/uL (ref 150–400)
RBC: 3.61 MIL/uL — AB (ref 3.87–5.11)
RDW: 15.5 % (ref 11.5–15.5)
WBC: 6 10*3/uL (ref 4.0–10.5)

## 2018-09-13 LAB — BASIC METABOLIC PANEL
Anion gap: 7 (ref 5–15)
BUN: 11 mg/dL (ref 6–20)
CO2: 32 mmol/L (ref 22–32)
Calcium: 8.2 mg/dL — ABNORMAL LOW (ref 8.9–10.3)
Chloride: 95 mmol/L — ABNORMAL LOW (ref 98–111)
Creatinine, Ser: 0.81 mg/dL (ref 0.44–1.00)
GFR calc non Af Amer: >60 mL/min (ref >60)
Glucose, Bld: 92 mg/dL (ref 70–99)
Potassium: 4.7 mmol/L (ref 3.5–5.1)
SODIUM: 134 mmol/L — AB (ref 135–145)

## 2018-09-13 MED ORDER — FOLIC ACID 1 MG PO TABS
1.0000 mg | ORAL_TABLET | Freq: Every day | ORAL | Status: DC
  Administered 2018-09-13 – 2018-09-17 (×4): 1 mg via ORAL
  Filled 2018-09-13 (×4): qty 1

## 2018-09-13 MED ORDER — LORAZEPAM 2 MG/ML IJ SOLN: 1.0000 mg | Freq: Four times a day (QID) | INTRAMUSCULAR | Status: AC | PRN

## 2018-09-13 MED ORDER — THIAMINE HCL 100 MG/ML IJ SOLN
100.0000 mg | Freq: Every day | INTRAMUSCULAR | Status: DC
  Filled 2018-09-13: qty 2

## 2018-09-13 MED ORDER — ADULT MULTIVITAMIN W/MINERALS CH
1.0000 | ORAL_TABLET | Freq: Every day | ORAL | Status: DC
  Administered 2018-09-13 – 2018-09-17 (×4): 1 via ORAL
  Filled 2018-09-13 (×4): qty 1

## 2018-09-13 MED ORDER — LORAZEPAM 1 MG PO TABS: 1.0000 mg | ORAL_TABLET | Freq: Four times a day (QID) | ORAL | Status: AC | PRN

## 2018-09-13 MED ORDER — VITAMIN B-1 100 MG PO TABS
100.0000 mg | ORAL_TABLET | Freq: Every day | ORAL | Status: DC
  Administered 2018-09-13 – 2018-09-17 (×4): 100 mg via ORAL
  Filled 2018-09-13 (×4): qty 1

## 2018-09-13 NOTE — Progress Notes (Signed)
[facility] [place] NAME: Tina Sharp    MR#:  [id number]  DATE OF BIRTH:  Dec 12, 1965  SUBJECTIVE:  CHIEF COMPLAINT:   Chief Complaint  Patient presents with  . Fall   The patient still has back pain and unable to move. REVIEW OF SYSTEMS:  Review of Systems  Constitutional: Negative for chills, fever and malaise/fatigue.  HENT: Negative for sore throat.   Eyes: Negative for blurred vision and double vision.  Respiratory: Negative for cough, hemoptysis, shortness of breath, wheezing and stridor.   Cardiovascular: Negative for chest pain, palpitations, orthopnea and leg swelling.  Gastrointestinal: Negative for abdominal pain, blood in stool, diarrhea, melena, nausea and vomiting.  Genitourinary: Negative for dysuria, flank pain and hematuria.  Musculoskeletal: Positive for back pain. Negative for joint pain.  Skin: Negative for rash.  Neurological: Negative for dizziness, sensory change, focal weakness, seizures, loss of consciousness, weakness and headaches.  Endo/Heme/Allergies: Negative for polydipsia.  Psychiatric/Behavioral: Negative for depression. The patient is not nervous/anxious.     DRUG ALLERGIES:  No Known Allergies VITALS:  Blood pressure 140/87, pulse 89, temperature 98 F (36.7 C), resp. rate 17, height 5\' 2"  (1.575 m), weight 63.5 kg, SpO2 100 %. PHYSICAL EXAMINATION:  Physical Exam Constitutional:      Appearance: Normal appearance.  HENT:     Head: Normocephalic.     Mouth/Throat:     Mouth: Mucous membranes are moist.  Eyes:     General: No scleral icterus.    Conjunctiva/sclera: Conjunctivae normal.     Pupils: Pupils are equal, round, and reactive to light.  Neck:     Musculoskeletal: Normal range of motion and neck supple.     Vascular: No JVD.     Trachea: No tracheal deviation.  Cardiovascular:     Rate and Rhythm: Normal rate and regular rhythm.     Heart sounds: Normal heart sounds. No  murmur. No gallop.   Pulmonary:     Effort: Pulmonary effort is normal. No respiratory distress.     Breath sounds: Normal breath sounds. No wheezing or rales.     Comments: Coarse breath sound. Abdominal:     General: Bowel sounds are normal. There is no distension.     Palpations: Abdomen is soft.     Tenderness: There is no abdominal tenderness. There is no rebound.  Musculoskeletal: Normal range of motion.        General: No tenderness.     Right lower leg: No edema.     Left lower leg: No edema.  Skin:    Findings: No erythema or rash.  Neurological:     General: No focal deficit present.     Mental Status: She is alert and oriented to person, place, and time.     Cranial Nerves: No cranial nerve deficit.  Psychiatric:        Mood and Affect: Mood normal.    LABORATORY PANEL:  Female CBC Recent Labs  Lab 09/13/18 0446  WBC 6.0  HGB 12.5  HCT 36.8  PLT 260   ------------------------------------------------------------------------------------------------------------------ Chemistries  Recent Labs  Lab 09/12/18 1054 09/13/18 0446  NA 131* 134*  K 2.8* 4.7  CL 85* 95*  CO2 36* 32  GLUCOSE 110* 92  BUN 10 11  CREATININE 0.75 0.81  CALCIUM 8.4* 8.2*  AST 58*  --   ALT 43  --   ALKPHOS 159*  --   BILITOT 1.0  --  RADIOLOGY:  Mr Brain Wo Contrast  Result Date: 09/12/2018 CLINICAL DATA:  Ataxia.  Recent fall.  History of strokes. EXAM: MRI HEAD WITHOUT CONTRAST TECHNIQUE: Multiplanar, multiecho pulse sequences of the brain and surrounding structures were obtained without intravenous contrast. COMPARISON:  Head CT 09/12/2018 and MRI 02/21/2016 FINDINGS: Brain: There is no evidence of acute infarct, acute intracranial hemorrhage, mass, midline shift, or extra-axial fluid collection. Small chronic cortical infarcts are again seen involving the right greater than left parietal lobes, right frontal lobe, right insula, and lateral right occipital lobe. Minimal chronic  blood products are again noted associated with the right occipital infarct. There is mild-to-moderate cerebral atrophy which is advanced for age. Vascular: Major intracranial vascular flow voids are preserved. Skull and upper cervical spine: Unremarkable bone marrow signal. Sinuses/Orbits: Unremarkable orbits. Posterior right ethmoid air cell opacification. Clear mastoid air cells. Other: Left parietal scalp swelling. IMPRESSION: 1. No acute intracranial abnormality. 2. Chronic right greater than left cerebral infarcts. Electronically Signed   By: Logan Bores M.D.   On: 09/12/2018 17:17   ASSESSMENT AND PLAN:   Tina Sharp  is a 53 y.o. 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  1.  Fall and T12 compression fracture-has been having falls lately, will admit, pain control -Physical therapy evaluation:SNF. orthopedics to see if she would be a candidate for kyphoplasty.  Coumadin is held.  2.  History of cryptogenic strokes-last stroke was a few years ago, INR is subtherapeutic -Patient on Coumadin.  Hold it for today to see if she would need kyphoplasty -Reinstate Coumadin and adjust and outpatient follow-up for therapeutic INR -On statin.    3.  Neuropathy-continue home medications  4.  History of breast cancer-status post surgery, chemoradiation.  Continue hormonal treatment  5.  Hypokalemia and hyponatremia-hold torsemide.   Improved.  6.  DVT prophylaxis-warfarin will be started after kyphoplasty if indicated.   Physical therapy consult: SNF placement.  All the records are reviewed and case discussed with Care Management/Social Worker. Management plans discussed with the patient, family and they are in agreement.  CODE STATUS: Full Code  TOTAL TIME TAKING CARE OF THIS PATIENT: 26 minutes.    More than 50% of the time was spent in counseling/coordination of care: YES  POSSIBLE D/C IN 2 DAYS, DEPENDING ON CLINICAL CONDITION.   Demetrios Loll M.D on 09/13/2018 at 1:07 PM  Between 7am to 6pm - Pager - [phone]  After 6pm go to [email or web address] - [employer] [facility]

## 2018-09-13 NOTE — Progress Notes (Addendum)
Patient daughter at bedside. She expressed concerns about patient drinking large amounts of wine daily. She reports that patient was previously a hard liquor drinker for years. She reports patient has been falling more over the last few months have multiple falls daily. Patient lives with her mother who is ill and physically unable to care for her. Daughter is interested in placement. Patient can not care for herself. She needs help with every area of her daily care. Patient is forgetful and will make inappropriate statements. She called this RN on the phone to ask if I was getting visitors. When I entered the room she proceeded to tell me a story about how she thought I was in my office in the supply room taking pay stubs from employees. Daughter states she has had hallucinations in the past where she felt bugs crawling on her and thought she had glass in her hands. Dr. Bridgett Larsson made aware. Social work consult entered.

## 2018-09-13 NOTE — Care Management Note (Addendum)
Case Management Note  Patient Details  Name: Tina Sharp MRN: [id number] Date of Birth: 1966-08-05  Subjective/Objective:  Patient admitted to [facility] under observation status for falls.Marland Kitchen RNCM consulted on patient to provide MOON letter and complete assessment. Patient currently lives with her mother and they help take care of each other. The mother is wheelchair bound and the patient has had increasing weakness recently. Patient thinks she could significantly benefit from home health and is requesting a rolling walker and bedside commode. CMS Medicare.gov Compare Post Acute Care list reviewed with patient  and patient thinks her family has used [facility] before. Heads up to [place] from [place] that there may be home health needs once patient works with PT. Patient also requesting to see a Education officer, museum in the community to assist with resources. If ok with MD will add home health CSW to Home health and face to face orders.   Update 2/2- Patient with altered mental status and hallucinations possibly related to withdrawals per nursing. PT recommending SNF. Patient seems agreeable but may opt to go home as she is the primary caregiver for her mother. Will update [facility] and follow for disposition as patient becomes more lucid and medically stable.                    Action/Plan:   Expected Discharge Date:                  Expected Discharge Plan:     In-House Referral:     Discharge planning Services  CM Consult  Post Acute Care Choice:  Durable Medical Equipment, Home Health Choice offered to:  Patient  DME Arranged:  Walker rolling, Bedside commode DME Agency:  [place]:    [place] Agency:  [facility]  Status of Service:  In process, will continue to follow  If discussed at Long Length of Stay Meetings, dates discussed:    Additional Comments:  Latanya Maudlin, RN 09/13/2018, 9:49 AM

## 2018-09-13 NOTE — Significant Event (Signed)
Rapid Response Event Note  Overview: Time Called: 2259 Arrival Time: 2302 Event Type: Neurologic  Initial Focused Assessment: Pt. In bed, RASS: -1, able to follow commands, VSS.  Pt. Reported to be previously very sensitive to morphine earlier on day shift.  Pt. Received vicodin 3 hours earlier per care RN this shift.    Interventions: Dr. Ara Kussmaul called, narcan ordered- 1 dose which was given.  Pt. Appeared to be alert and calm, VSS.  Plan of Care (if not transferred): Care RN instructed to call another rapid if the pt.'s vitals deteriorate or if the pt. Becomes lethargic again.  Event Summary: Name of Physician Notified: Dr. Ara Kussmaul at 2310    at    Outcome: Stayed in room and stabalized  Event End Time: [place]

## 2018-09-13 NOTE — Evaluation (Signed)
Physical Therapy Evaluation Patient Details Name: Tina Sharp MRN: [id number] DOB: [birth year] Today's Date: 09/13/2018   History of Present Illness  53 y.o. 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 CT of the head with chronic changes and a small left parietal hematoma.  CT of the lumbar spine indicating moderate compression fracture of T12 vertebral body.    Clinical Impression  Pt alert, oriented to self, date, situation, mild back pain at start of session. Pt reported that she lives with her mother in 1 story home, prior to admission independent for ADLs, some assistance needed for IADLs (does not drive, friend provides groceries, able to cook some meals). Stated in the last 1-28months she has an acute decline in functional mobility, as well as 4 falls in the last 6 months. Ambulates with RW at baseline.   Pt instructed in log rolling technique to minimize back pain with fair carry over. Pt with difficulty with sitting EOB balance initially, improved with multimodal cues and UE support. minAx1 to mobilize and to sit/scoot to EOB. Pt needed several attempts to successfully perform sit <> stand, CGA-minAx1. In standing, pt exhibited confusion. Unable to utilize RW properly, impulsive in movements, impaired coordination to initiate steps/turn towards bedside chair, returned to EOB maxAx1. Pt reported feeling like her "head is in a tunnel", no paresthesia, focal weakness, slurred speech.  RN notified of patient's mobility status, as well as distress after mobility due to back pain. Overall the patient demonstrated limitations in mobility, safety, and independence and complained of significant back pain. The patient would benefit from further skilled PT to address these limitations. Recommendation is STR due to decreased  caregiver support and safety concerns with mobility.    Follow Up Recommendations SNF    Equipment Recommendations  Other (comment)(TBD)    Recommendations for Other Services       Precautions / Restrictions Precautions Precautions: Fall Precaution Comments: T12 compression fx Restrictions Weight Bearing Restrictions: No      Mobility  Bed Mobility Overal bed mobility: Needs Assistance Bed Mobility: Supine to Sit;Sit to Supine     Supine to sit: Min assist Sit to supine: Min guard   General bed mobility comments: use of bed rails, extended time. Most difficulty with trunk elevation and scooting to EOB, minAx1 needed  Transfers Overall transfer level: Needs assistance Equipment used: Rolling walker (2 wheeled) Transfers: Sit to/from Stand Sit to Stand: Min assist;From elevated surface         General transfer comment: multiple attempts needed. difficulty coordinating/sequencing movements  Ambulation/Gait Ambulation/Gait assistance: Max assist Gait Distance (Feet): 1 Feet Assistive device: Rolling walker (2 wheeled)       General Gait Details: Pt with significant difficulty following commands, unable to use RW appropriately, impulsive. Anxious to return to sitting without being at EOB, max physical assist and multimodal cues to return to bed.  Stairs            Wheelchair Mobility    Modified Rankin (Stroke Patients Only)       Balance Overall balance assessment: Needs assistance Sitting-balance support: Feet supported Sitting balance-Leahy Scale: Poor Sitting balance - Comments: initial sitting balance very poor. able to progress to fair sitting balance with feet supported, multimodal cues. Pt more comfortable with at least 1 UE support     Standing balance-Leahy  Scale: Poor Standing balance comment: Pt verbally cued for posture                             Pertinent Vitals/Pain Pain Assessment: Faces Faces Pain Scale: Hurts whole  lot Pain Location: mid back Pain Descriptors / Indicators: Crying;Grimacing;Moaning;Restless Pain Intervention(s): Limited activity within patient's tolerance;Repositioned;Monitored during session    [place] expects to be discharged to:: Private residence Living Arrangements: Parent Available Help at Discharge: Family;Available 24 hours/day;Other (Comment)(mother unable to provide physical assist) Type of Home: House Home Access: Ramped entrance     Home Layout: One level Home Equipment: [place] - 2 wheels;Shower seat;Cane - single point;Hospital bed;Wheelchair - manual      Prior Function Level of Independence: Needs assistance   Gait / Transfers Assistance Needed: ambulates at baseline with RW. Patient endorses recent decline in mobility in the last 1-2 months, unable to walk as far, increase number of falls  ADL's / Homemaking Assistance Needed:  friend bring her and her mom groceries, states that her mom and her take turns cooking  Comments: Pt stated she has had at least 4 falls in the last 6 months, most recent precipitated this admission     Hand Dominance   Dominant Hand: Right    Extremity/Trunk Assessment   Upper Extremity Assessment Upper Extremity Assessment: Generalized weakness    Lower Extremity Assessment Lower Extremity Assessment: Generalized weakness    Cervical / Trunk Assessment Cervical / Trunk Assessment: Kyphotic  Communication   Communication: No difficulties  Cognition Arousal/Alertness: Awake/alert Behavior During Therapy: Anxious Overall Cognitive Status: No family/caregiver present to determine baseline cognitive functioning                                 General Comments: pt able to follow commands when asked questions/perform mobility. In standing, pt unable to coordinate or understand use of AD, how to take step forwards, or how to return to EOB without assistance.      General Comments       Exercises     Assessment/Plan    PT Assessment Patient needs continued PT services  PT Problem List Decreased strength;Pain;Decreased coordination;Decreased activity tolerance;Decreased knowledge of use of DME;Decreased balance;Decreased safety awareness;Decreased mobility;Decreased knowledge of precautions       PT Treatment Interventions DME instruction;Therapeutic exercise;Gait training;Balance training;Stair training;Neuromuscular re-education;Functional mobility training;Therapeutic activities;Patient/family education    PT Goals (Current goals can be found in the Care Plan section)  Acute Rehab PT Goals Patient Stated Goal: Pt wants to regain her strength, improve independence PT Goal Formulation: With patient Time For Goal Achievement: 09/27/18 Potential to Achieve Goals: Fair    Frequency Min 2X/week   Barriers to discharge Decreased caregiver support      Co-evaluation               AM-PAC PT "6 Clicks" Mobility  Outcome Measure Help needed turning from your back to your side while in a flat bed without using bedrails?: A Little Help needed moving from lying on your back to sitting on the side of a flat bed without using bedrails?: A Little Help needed moving to and from a bed to a chair (including a wheelchair)?: A Lot Help needed standing up from a chair using your arms (e.g., wheelchair or bedside chair)?: A Little Help needed to walk in hospital room?: A Lot Help needed climbing  3-5 steps with a railing? : Total 6 Click Score: 14    End of Session Equipment Utilized During Treatment: Gait belt Activity Tolerance: Patient limited by pain;Other (comment)(mobility limited due to difficulty following commands/safety concerns) Patient left: in bed;with bed alarm set;with call bell/phone within reach Nurse Communication: Mobility status PT Visit Diagnosis: Other abnormalities of gait and mobility (R26.89);Muscle weakness (generalized) (M62.81);History of  falling (Z91.81);Difficulty in walking, not elsewhere classified (R26.2);Pain Pain - Right/Left: (back pain)    Time: 8546-2703 PT Time Calculation (min) (ACUTE ONLY): 30 min   Charges:   PT Evaluation $PT Eval Moderate Complexity: 1 Mod PT Treatments $Therapeutic Activity: 8-22 mins        Lieutenant Diego PT, DPT 12:22 PM,09/13/18 [phone]

## 2018-09-13 NOTE — Care Management Obs Status (Signed)
[facility] NOTIFICATION   Patient Details  Name: Tina Sharp MRN: [id number] Date of Birth: March 09, 1966   Medicare Observation Status Notification Given:  Yes    Aribella Vavra A Brigido Mera, RN 09/13/2018, 9:48 AM

## 2018-09-13 NOTE — Progress Notes (Signed)
RRT called at 2315. Pt was very difficult to arouse. Pt had received 1 norco pill about 3 hours prior. MD notified. Orders for Narcan PRN. Dose given x 1. Pt became more alert as more people entered her room and even more alert after dose of narcan. VSS. MD made adjustments to patient's pain regimen as well. 0220 pt is difficult to arouse again. VSS. MD notified another dose of narcan to be given. Once pt is awakened she appears very alert. Pt states people at home have a difficult time waking her also. Will continue to monitor.

## 2018-09-14 DIAGNOSIS — F10239 Alcohol dependence with withdrawal, unspecified: Secondary | ICD-10-CM | POA: Diagnosis present

## 2018-09-14 DIAGNOSIS — S22000A Wedge compression fracture of unspecified thoracic vertebra, initial encounter for closed fracture: Secondary | ICD-10-CM | POA: Diagnosis present

## 2018-09-14 DIAGNOSIS — I10 Essential (primary) hypertension: Secondary | ICD-10-CM | POA: Diagnosis present

## 2018-09-14 DIAGNOSIS — W01198A Fall on same level from slipping, tripping and stumbling with subsequent striking against other object, initial encounter: Secondary | ICD-10-CM | POA: Diagnosis present

## 2018-09-14 DIAGNOSIS — F419 Anxiety disorder, unspecified: Secondary | ICD-10-CM | POA: Diagnosis present

## 2018-09-14 DIAGNOSIS — Z823 Family history of stroke: Secondary | ICD-10-CM | POA: Diagnosis not present

## 2018-09-14 DIAGNOSIS — S0003XA Contusion of scalp, initial encounter: Secondary | ICD-10-CM | POA: Diagnosis present

## 2018-09-14 DIAGNOSIS — Z8041 Family history of malignant neoplasm of ovary: Secondary | ICD-10-CM | POA: Diagnosis not present

## 2018-09-14 DIAGNOSIS — R296 Repeated falls: Secondary | ICD-10-CM | POA: Diagnosis present

## 2018-09-14 DIAGNOSIS — K219 Gastro-esophageal reflux disease without esophagitis: Secondary | ICD-10-CM | POA: Diagnosis present

## 2018-09-14 DIAGNOSIS — E860 Dehydration: Secondary | ICD-10-CM | POA: Diagnosis present

## 2018-09-14 DIAGNOSIS — F329 Major depressive disorder, single episode, unspecified: Secondary | ICD-10-CM | POA: Diagnosis present

## 2018-09-14 DIAGNOSIS — Z9221 Personal history of antineoplastic chemotherapy: Secondary | ICD-10-CM | POA: Diagnosis not present

## 2018-09-14 DIAGNOSIS — Z87891 Personal history of nicotine dependence: Secondary | ICD-10-CM | POA: Diagnosis not present

## 2018-09-14 DIAGNOSIS — F10930 Alcohol use, unspecified with withdrawal, uncomplicated: Secondary | ICD-10-CM | POA: Diagnosis present

## 2018-09-14 DIAGNOSIS — Z803 Family history of malignant neoplasm of breast: Secondary | ICD-10-CM | POA: Diagnosis not present

## 2018-09-14 DIAGNOSIS — Z853 Personal history of malignant neoplasm of breast: Secondary | ICD-10-CM | POA: Diagnosis not present

## 2018-09-14 DIAGNOSIS — E876 Hypokalemia: Secondary | ICD-10-CM | POA: Diagnosis present

## 2018-09-14 DIAGNOSIS — Y92002 Bathroom of unspecified non-institutional (private) residence single-family (private) house as the place of occurrence of the external cause: Secondary | ICD-10-CM | POA: Diagnosis not present

## 2018-09-14 DIAGNOSIS — Z7901 Long term (current) use of anticoagulants: Secondary | ICD-10-CM | POA: Diagnosis not present

## 2018-09-14 DIAGNOSIS — Z923 Personal history of irradiation: Secondary | ICD-10-CM | POA: Diagnosis not present

## 2018-09-14 DIAGNOSIS — G629 Polyneuropathy, unspecified: Secondary | ICD-10-CM | POA: Diagnosis present

## 2018-09-14 DIAGNOSIS — E871 Hypo-osmolality and hyponatremia: Secondary | ICD-10-CM | POA: Diagnosis present

## 2018-09-14 DIAGNOSIS — Z9012 Acquired absence of left breast and nipple: Secondary | ICD-10-CM | POA: Diagnosis not present

## 2018-09-14 DIAGNOSIS — Z79899 Other long term (current) drug therapy: Secondary | ICD-10-CM | POA: Diagnosis not present

## 2018-09-14 DIAGNOSIS — M542 Cervicalgia: Secondary | ICD-10-CM | POA: Diagnosis present

## 2018-09-14 DIAGNOSIS — I69351 Hemiplegia and hemiparesis following cerebral infarction affecting right dominant side: Secondary | ICD-10-CM | POA: Diagnosis not present

## 2018-09-14 DIAGNOSIS — S22089A Unspecified fracture of T11-T12 vertebra, initial encounter for closed fracture: Secondary | ICD-10-CM | POA: Diagnosis present

## 2018-09-14 LAB — HIV ANTIBODY (ROUTINE TESTING W REFLEX): HIV Screen 4th Generation wRfx: NONREACTIVE

## 2018-09-14 NOTE — Progress Notes (Signed)
[place] [facility] [place] NAME: Tina Sharp    MR#:  [id number]  DATE OF BIRTH:  April 02, 1966  SUBJECTIVE:  CHIEF COMPLAINT:   Chief Complaint  Patient presents with  . Fall   The patient has better back pain but complains of tremor and anxiety.  She had hallucination per RN REVIEW OF SYSTEMS:  Review of Systems  Constitutional: Positive for malaise/fatigue. Negative for chills and fever.  HENT: Negative for sore throat.   Eyes: Negative for blurred vision and double vision.  Respiratory: Negative for cough, hemoptysis, shortness of breath, wheezing and stridor.   Cardiovascular: Negative for chest pain, palpitations, orthopnea and leg swelling.  Gastrointestinal: Negative for abdominal pain, blood in stool, diarrhea, melena, nausea and vomiting.  Genitourinary: Negative for dysuria, flank pain and hematuria.  Musculoskeletal: Positive for back pain. Negative for joint pain.  Skin: Negative for rash.  Neurological: Positive for tremors. Negative for dizziness, sensory change, focal weakness, seizures, loss of consciousness, weakness and headaches.  Endo/Heme/Allergies: Negative for polydipsia.  Psychiatric/Behavioral: Positive for hallucinations. Negative for depression. The patient is nervous/anxious.     DRUG ALLERGIES:  No Known Allergies VITALS:  Blood pressure (!) 154/92, pulse 92, temperature 98 F (36.7 C), temperature source Oral, resp. rate 17, height 5\' 2"  (1.575 m), weight 63.5 kg, SpO2 95 %. PHYSICAL EXAMINATION:  Physical Exam Constitutional:      Appearance: Normal appearance.  HENT:     Head: Normocephalic.     Mouth/Throat:     Mouth: Mucous membranes are moist.  Eyes:     General: No scleral icterus.    Conjunctiva/sclera: Conjunctivae normal.     Pupils: Pupils are equal, round, and reactive to light.  Neck:     Musculoskeletal: Normal range of motion and neck supple.     Vascular: No JVD.     Trachea: No  tracheal deviation.  Cardiovascular:     Rate and Rhythm: Normal rate and regular rhythm.     Heart sounds: Normal heart sounds. No murmur. No gallop.   Pulmonary:     Effort: Pulmonary effort is normal. No respiratory distress.     Breath sounds: Normal breath sounds. No wheezing or rales.     Comments: Coarse breath sound. Abdominal:     General: Bowel sounds are normal. There is no distension.     Palpations: Abdomen is soft.     Tenderness: There is no abdominal tenderness. There is no rebound.  Musculoskeletal: Normal range of motion.        General: No tenderness.     Right lower leg: No edema.     Left lower leg: No edema.  Skin:    Findings: No erythema or rash.  Neurological:     General: No focal deficit present.     Mental Status: She is alert and oriented to person, place, and time.     Cranial Nerves: No cranial nerve deficit.  Psychiatric:        Mood and Affect: Mood normal.    LABORATORY PANEL:  Female CBC Recent Labs  Lab 09/13/18 0446  WBC 6.0  HGB 12.5  HCT 36.8  PLT 260   ------------------------------------------------------------------------------------------------------------------ Chemistries  Recent Labs  Lab 09/12/18 1054 09/13/18 0446  NA 131* 134*  K 2.8* 4.7  CL 85* 95*  CO2 36* 32  GLUCOSE 110* 92  BUN 10 11  CREATININE 0.75 0.81  CALCIUM 8.4* 8.2*  AST 58*  --  ALT 43  --   ALKPHOS 159*  --   BILITOT 1.0  --    RADIOLOGY:  No results found. ASSESSMENT AND PLAN:   Tina Sharp  is a 53 y.o. 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  1.  Fall and T12 compression fracture-has been having falls lately, will admit, pain control -Physical therapy evaluation:SNF. Per Dr. Posey Pronto, Dr. Rudene Christians will do kyphoplasty this coming Tuesday.  Coumadin is  held.  2.  History of cryptogenic strokes-last stroke was a few years ago, INR is subtherapeutic -Patient on Coumadin.  Hold it for today to see if she would need kyphoplasty -Reinstate Coumadin and adjust and outpatient follow-up for therapeutic INR -On statin.    3.  Neuropathy-continue home medications  4.  History of breast cancer-status post surgery, chemoradiation.  Continue hormonal treatment  5.  Hypokalemia and hyponatremia-hold torsemide.   Improved.  6.  DVT prophylaxis-warfarin will be started after kyphoplasty if indicated.   Alcohol withdrawal.  The patient is on CIWA protocol. Hyponatremia.  Improved. Physical therapy consult: SNF placement.  I discussed with Dr. Posey Pronto, orthopedic surgeon. All the records are reviewed and case discussed with Care Management/Social Worker. Management plans discussed with the patient, family and they are in agreement.  CODE STATUS: Full Code  TOTAL TIME TAKING CARE OF THIS PATIENT: 28 minutes.   More than 50% of the time was spent in counseling/coordination of care: YES  POSSIBLE D/C IN 3 DAYS, DEPENDING ON CLINICAL CONDITION.   Demetrios Loll M.D on 09/14/2018 at 11:02 AM  Between 7am to 6pm - Pager - [phone]  After 6pm go to [email or web address] - [facility]

## 2018-09-14 NOTE — Clinical Social Work Note (Signed)
Clinical Social Work Assessment  Patient Details  Name: Tina Sharp MRN: [id number] Date of Birth: 08-27-1965  Date of referral:  09/14/18               Reason for consult:  Facility Placement                Permission sought to share information with:  Chartered certified accountant granted to share information::  Yes, Verbal Permission Granted  Name::        Agency::  [facility] SNFs  Relationship::     Contact Information:     Housing/Transportation Living arrangements for the past 2 months:  [place] of Information:  Patient, Medical Team, Adult Children Patient Interpreter Needed:  None Criminal Activity/Legal Involvement Pertinent to Current Situation/Hospitalization:  No - Comment as needed Significant Relationships:  Adult Children, Warehouse manager, Parents Lives with:  Parents Do you feel safe going back to the place where you live?  Yes Need for family participation in patient care:     Care giving concerns:  PT recommendation for SNF   Social Worker assessment / plan: The CSW met with the patient at bedside to discuss discharge planning. The patient provided verbal permission to send the referral for SNF. The CSW provided the patient with the Medicare.gov list of approved SNFs. The patient stated that she had no questions.  The CSW contacted the patient's daughter to discuss discharge planning. Tina Sharp shared that she is concerned with her mother returning home due to ongoing weakness and falls. The CSW asked if the patient's mother, for whom the patient provides care, would have adequate care while the patient admits to a SNF. Tina Sharp shared that the patient's mother has the ability to have 24-hour care while the patient is recovering. The CSW advised Tina Sharp of the need for prior authorization as well as upcoming Kyphoplasty. Tina Sharp thanked the CSW for information and assistance.  The patient has a history of alcohol abuse according  to the chart including apparent DTs during withdrawal. Currently, the patient seems to be A&O x4; however, during her admission, that orientation has not been consistent per the nursing staff. The patient is under CIWA protocol. The CSW has sent the referral for SNF and will continue to follow for discharge planning post-surgery.  Employment status:  Disabled (Comment on whether or not currently receiving Disability) Insurance information:  Programmer, applications PT Recommendations:  [place] / Referral to community resources:  [place]  Patient/Family's Response to care:  The patient and her daughter thanked the CSW.  Patient/Family's Understanding of and Emotional Response to Diagnosis, Current Treatment, and Prognosis: The patient's daughter understands and agrees with the discharge plan. Currently, the patient also agrees; however, with change in mentation during CIWA, she may change her mind. A discharge home would not be a safe discharge plan per the patient's family.  Emotional Assessment Appearance:  Appears older than stated age Attitude/Demeanor/Rapport:  Engaged Affect (typically observed):  Pleasant Orientation:  Oriented to Self, Oriented to Place, Oriented to Situation, Oriented to  Time Alcohol / Substance use:  Alcohol Use Psych involvement (Current and /or in the community):  No (Comment)  Discharge Needs  Concerns to be addressed:  Discharge Planning Concerns, Care Coordination, Substance Abuse Concerns Readmission within the last 30 days:  No Current discharge risk:  Chronically ill, Physical Impairment, Substance Abuse Barriers to Discharge:  Continued Medical Work up, [place],  LCSW 09/14/2018, 12:13 PM  

## 2018-09-14 NOTE — NC FL2 (Signed)
[place] LEVEL OF CARE SCREENING TOOL     IDENTIFICATION  Patient Name: Tina Sharp Birthdate: [birth year] Sex: female Admission Date (Current Location): 09/12/2018  [place] and [place] Number:  Engineering geologist and Address:  [facility], [address] [place], [place] [address]      Provider Number: [id number]  Attending Physician Name and Address:  Demetrios Loll, MD  Relative Name and Phone Number:  Beola Cord (Daughter) [phone]    Current Level of Care: Hospital Recommended Level of Care: [place] Prior Approval Number:    Date Approved/Denied:   PASRR Number: [id number]  Discharge Plan: SNF    Current Diagnoses: Patient Active Problem List   Diagnosis Date Noted  . Alcohol withdrawal ([place]) 09/14/2018  . Fall 09/12/2018  . Lymphedema 04/30/2018  . Hypertension 01/14/2018  . Leg swelling 01/14/2018  . Breast calcifications on mammogram 06/24/2017  . Hip fracture ([place]) 07/30/2016  . 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 Rotator cuff tendinitis, left 06/15/2016  . Neuropathy 01/03/2016  . Ataxia 01/03/2016  . Myalgia 09/08/2015  . Depression 09/06/2015  . Cerebrovascular accident (CVA) due to thrombosis of posterior cerebral artery ([place]) 08/18/2015  . 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 Stroke with cerebral ischemia ([place]) 07/12/2015  . HLD (hyperlipidemia) 07/12/2015  . CVA (cerebral infarction) 06/21/2015  . TIA (transient ischemic attack) 06/21/2015  . Macrocytic anemia 06/20/2015  . Headache 06/14/2015  . Cigarette smoker 06/14/2015  . Stroke (cerebrum) ([place]) cryptogenic R brain infarcts s/p IV tPA  06/11/2015  . 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, estrogen receptor positive ([place]) 04/23/2014    Orientation RESPIRATION BLADDER Height & Weight     Self, Place, Time, Situation  Normal Continent Weight: 140 lb (63.5 kg) Height:  5\' 2"  (157.5 cm)   BEHAVIORAL SYMPTOMS/MOOD NEUROLOGICAL BOWEL NUTRITION STATUS      Continent Diet(Heart healthy)  AMBULATORY STATUS COMMUNICATION OF NEEDS Skin   Extensive Assist Verbally Surgical wounds                       Personal Care Assistance Level of Assistance  Bathing, Feeding, Dressing Bathing Assistance: Limited assistance Feeding assistance: Independent Dressing Assistance: Limited assistance     Functional Limitations Info  Sight, Hearing, Speech Sight Info: Adequate Hearing Info: Adequate Speech Info: Adequate    SPECIAL CARE FACTORS FREQUENCY  PT (By licensed PT), OT (By licensed OT)     PT Frequency: Up to 5X per week OT Frequency: Up to 3X per week            Contractures Contractures Info: Not present    Additional Factors Info  Code Status, Allergies, Psychotropic Code Status Info: Full Allergies Info: No Known Allergies Psychotropic Info: Cymbalta, Ativan, Klonopin         Current Medications (09/14/2018):  This is the current hospital active medication list Current Facility-Administered Medications  Medication Dose Route Frequency Provider Last Rate Last Dose  . acetaminophen (TYLENOL) tablet 650 mg  650 mg Oral Q6H PRN Gladstone Lighter, MD       Or  . acetaminophen (TYLENOL) suppository 650 mg  650 mg Rectal Q6H PRN Gladstone Lighter, MD      . anastrozole (ARIMIDEX) tablet 1 mg  1 mg Oral QPM Gladstone Lighter, MD   1 mg at 09/13/18 2876  . aspirin EC tablet 81 mg  81 mg Oral Daily Gladstone Lighter, MD   81 mg  at 09/14/18 0834  . atorvastatin (LIPITOR) tablet 40 mg  40 mg Oral q1800 Gladstone Lighter, MD   40 mg at 09/13/18 1837  . baclofen (LIORESAL) tablet 10 mg  10 mg Oral TID Gladstone Lighter, MD   10 mg at 09/14/18 1308  . clonazePAM (KLONOPIN) disintegrating tablet 0.5 mg  0.5 mg Oral BID Gladstone Lighter, MD   0.5 mg at 09/14/18 0834  . docusate sodium (COLACE) capsule 100 mg  100 mg Oral BID Gladstone Lighter, MD   100 mg at  09/14/18 6578  . DULoxetine (CYMBALTA) DR capsule 60 mg  60 mg Oral Daily Gladstone Lighter, MD   60 mg at 09/14/18 4696  . enoxaparin (LOVENOX) injection 40 mg  40 mg Subcutaneous Q24H Gladstone Lighter, MD   40 mg at 09/13/18 1838  . folic acid (FOLVITE) tablet 1 mg  1 mg Oral Daily Demetrios Loll, MD   1 mg at 09/14/18 2952  . gabapentin (NEURONTIN) capsule 600 mg  600 mg Oral QID Gladstone Lighter, MD   600 mg at 09/14/18 8413  . HYDROcodone-acetaminophen (NORCO/VICODIN) 5-325 MG per tablet 1 tablet  1 tablet Oral Q6H PRN Hugelmeyer, Alexis, DO   1 tablet at 09/13/18 1434  . ketorolac (TORADOL) 15 MG/ML injection 15 mg  15 mg Intravenous Q6H PRN Hugelmeyer, Alexis, DO   15 mg at 09/13/18 1007  . LORazepam (ATIVAN) tablet 1 mg  1 mg Oral Q6H PRN Demetrios Loll, MD       Or  . LORazepam (ATIVAN) injection 1 mg  1 mg Intravenous Q6H PRN Demetrios Loll, MD      . morphine 2 MG/ML injection 1 mg  1 mg Intravenous Q2H PRN Hugelmeyer, Alexis, DO      . multivitamin with minerals tablet 1 tablet  1 tablet Oral Daily Demetrios Loll, MD   1 tablet at 09/14/18 0835  . naloxone [facility]) injection 0.4 mg  0.4 mg Intravenous PRN Hugelmeyer, Alexis, DO   0.4 mg at 09/13/18 0214  . ondansetron (ZOFRAN) tablet 4 mg  4 mg Oral Q6H PRN Gladstone Lighter, MD       Or  . ondansetron (ZOFRAN) injection 4 mg  4 mg Intravenous Q6H PRN Gladstone Lighter, MD      . pantoprazole (PROTONIX) EC tablet 40 mg  40 mg Oral Daily Gladstone Lighter, MD   40 mg at 09/14/18 0834  . polyethylene glycol (MIRALAX / GLYCOLAX) packet 17 g  17 g Oral Daily PRN Gladstone Lighter, MD      . thiamine (VITAMIN B-1) tablet 100 mg  100 mg Oral Daily Demetrios Loll, MD   100 mg at 09/14/18 2440   Or  . thiamine (B-1) injection 100 mg  100 mg Intravenous Daily Demetrios Loll, MD      . vitamin B-12 (CYANOCOBALAMIN) tablet 1,000 mcg  1,000 mcg Oral Daily Gladstone Lighter, MD   1,000 mcg at 09/14/18 1027     Discharge Medications: Please see discharge  summary for a list of discharge medications.  Relevant Imaging Results:  Relevant Lab Results:   Additional Information SS#[id number]  Zettie Pho, LCSW

## 2018-09-15 LAB — PROTIME-INR
INR: 1.16
Prothrombin Time: 14.7 seconds (ref 11.4–15.2)

## 2018-09-15 MED ORDER — CEFAZOLIN SODIUM-DEXTROSE 1-4 GM/50ML-% IV SOLN
1.0000 g | Freq: Once | INTRAVENOUS | Status: AC
  Administered 2018-09-16: 1 g via INTRAVENOUS
  Filled 2018-09-15: qty 50

## 2018-09-15 MED ORDER — POLYETHYLENE GLYCOL 3350 17 G PO PACK
17.0000 g | PACK | Freq: Every day | ORAL | Status: DC
  Administered 2018-09-15 – 2018-09-17 (×2): 17 g via ORAL
  Filled 2018-09-15 (×2): qty 1

## 2018-09-15 NOTE — Consult Note (Signed)
Patient was seen briefly on Friday and discussed again today with her the kyphoplasty procedure.  She has a T12 compression fracture caused by abrupt fall at home.  Because of her severe pain she would is considering the procedure.  I reviewed the procedure in detail today with bone model and brochure.  On exam she has no clonus she has severe tenderness at the affected level T12 with point tenderness.  Radiographs confirm comminuted T12 compression fracture.  Impression is acute T12 compression fracture with severe pain Recommendation is for T12 kyphoplasty if pro time is normalized, we will tentatively put her on the schedule for tomorrow.

## 2018-09-15 NOTE — Progress Notes (Signed)
Clinical Social Worker (CSW) presented bed offers to patient and she chose [facility]. Per Otila Klu[facility] liaison she will start [facility] authorization today.   [employer], LCSW [phone]

## 2018-09-15 NOTE — Progress Notes (Signed)
Dr. Rudene Christians explained surgery at pt bedside. Pt wants to think about the surgery and defer until morning of 2/4 to sign consent.

## 2018-09-15 NOTE — Anesthesia Preprocedure Evaluation (Addendum)
Anesthesia Evaluation  Patient identified by MRN, date of birth, ID band Patient awake    Reviewed: Allergy & Precautions, H&P , NPO status , Patient's Chart, lab work & pertinent test results  Airway Mallampati: II  TM Distance: >3 FB     Dental  (+) Chipped   Pulmonary former smoker,           Cardiovascular hypertension,      Neuro/Psych  Headaches, PSYCHIATRIC DISORDERS Anxiety Depression TIACVA    GI/Hepatic Neg liver ROS, GERD  Controlled,  Endo/Other  negative endocrine ROS  Renal/GU negative Renal ROS  negative genitourinary   Musculoskeletal   Abdominal   Peds  Hematology  (+) Blood dyscrasia, anemia ,   Anesthesia Other Findings Past Medical History: No date: Anxiety 01/2014: Breast cancer ([place])     Comment:  Invasive lobular carcinoma, 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              ER/ PR+; Her 2 neu not overexpressed, microscopic               positive margin (skin). Oncotype DX, low risk for               recurrence. 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   Reproductive/Obstetrics negative OB ROS                            Anesthesia Physical Anesthesia Plan  ASA: III  Anesthesia Plan: General   Post-op Pain Management:    Induction:   PONV Risk Score and Plan: Propofol infusion and TIVA  Airway Management Planned: Natural Airway and Nasal Cannula  Additional Equipment:   Intra-op Plan:   Post-operative Plan:   Informed Consent: I have reviewed the patients History and Physical, chart, labs and discussed the procedure including the risks, benefits and alternatives for the proposed anesthesia with the patient or authorized representative who has indicated  his/her understanding and acceptance.     Dental Advisory Given  Plan Discussed with: Anesthesiologist and CRNA  Anesthesia Plan Comments:         Anesthesia Quick Evaluation

## 2018-09-15 NOTE — Clinical Social Work Placement (Signed)
   CLINICAL SOCIAL WORK PLACEMENT  NOTE  Date:  09/15/2018  Patient Details  Name: AVIONNA BOWER MRN: [id number] Date of Birth: Aug 05, 1966  Clinical Social Work is seeking post-discharge placement for this patient at the [place] level of care (*CSW will initial, date and re-position this form in  chart as items are completed):  Yes   Patient/family provided with [place] Work Department's list of facilities offering this level of care within the geographic area requested by the patient (or if unable, by the patient's family).  Yes   Patient/family informed of their freedom to choose among providers that offer the needed level of care, that participate in Medicare, Medicaid or managed care program needed by the patient, have an available bed and are willing to accept the patient.  Yes   Patient/family informed of [facility]'s ownership interest in [facility] and [facility], as well as of the fact that they are under no obligation to receive care at these facilities.  PASRR submitted to EDS on       PASRR number received on       Existing PASRR number confirmed on 09/14/18     FL2 transmitted to all facilities in geographic area requested by pt/family on 09/14/18     FL2 transmitted to all facilities within larger geographic area on       Patient informed that his/her managed care company has contracts with or will negotiate with certain facilities, including the following:        Yes   Patient/family informed of bed offers received.  Patient chooses bed at ([facility])     Physician recommends and patient chooses bed at      Patient to be transferred to   on  .  Patient to be transferred to facility by       Patient family notified on   of transfer.  Name of family member notified:        PHYSICIAN       Additional Comment:    _______________________________________________ Avrey Hyser, Veronia Beets, LCSW 09/15/2018, 3:18 PM

## 2018-09-15 NOTE — Progress Notes (Signed)
Physical Therapy Treatment Patient Details Name: KARIYA LAVERGNE MRN: [id number] DOB: [birth year] Today's Date: 09/15/2018    History of Present Illness 53 y.o. 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 CT of the head with chronic changes and a small left parietal hematoma.  CT of the lumbar spine indicating moderate compression fracture of T12 vertebral body.    PT Comments    Pt sleeping; awoken through voice. Pt agreeable to PT supine exercises; reports pain in back 5/10 post medication. Pt continues to feel lethargic throughout session, but performs well. Education on abdominal stabilization for improved back support. Family enters during session; pt/family questions answered to their satisfaction regarding pain management with activity/function and discharge planning post potential procedure. Continue PT to progress strength, endurance and balance with hopeful improved pain levels to improve all functional mobility.      Follow Up Recommendations  SNF     Equipment Recommendations       Recommendations for Other Services       Precautions / Restrictions Precautions Precautions: Fall Precaution Comments: T12 compression fx Restrictions Weight Bearing Restrictions: No    Mobility  Bed Mobility               General bed mobility comments: Not tested due to lethargy (feeling "like I can only lay back post medication"  Transfers                    Ambulation/Gait                 Stairs             Wheelchair Mobility    Modified Rankin (Stroke Patients Only)       Balance                                            Cognition Arousal/Alertness: Lethargic;Suspect due to medications Behavior During Therapy: [facility] for tasks  assessed/performed Overall Cognitive Status: Within Functional Limits for tasks assessed                                        Exercises General Exercises - Lower Extremity Ankle Circles/Pumps: AROM;Both;20 reps;Supine Quad Sets: Strengthening;Both;20 reps;Supine Gluteal Sets: Strengthening;Both;20 reps;Supine Short Arc Quad: AROM;Both;20 reps;Supine Heel Slides: AROM;Both;20 reps;Supine Hip ABduction/ADduction: AROM;Both;20 reps;Supine Straight Leg Raises: AAROM;Both;20 reps;Supine Other Exercises Other Exercises: Abdominal stabilization 10x with 5 second hold Other Exercises: Pt family education regarding pain management with regards to function/activity and safety concerns    General Comments        Pertinent Vitals/Pain Pain Assessment: 0-10 Pain Score: 5  Pain Location: mid back Pain Descriptors / Indicators: Aching;Constant Pain Intervention(s): Premedicated before session;Monitored during session    Home Living                      Prior Function            PT Goals (current goals can now be found in the care plan section) Progress towards PT goals: Progressing toward goals    Frequency    Min 2X/week  PT Plan Current plan remains appropriate    Co-evaluation              AM-PAC PT "6 Clicks" Mobility   Outcome Measure  Help needed turning from your back to your side while in a flat bed without using bedrails?: A Little Help needed moving from lying on your back to sitting on the side of a flat bed without using bedrails?: A Little Help needed moving to and from a bed to a chair (including a wheelchair)?: A Lot Help needed standing up from a chair using your arms (e.g., wheelchair or bedside chair)?: A Little Help needed to walk in hospital room?: A Lot Help needed climbing 3-5 steps with a railing? : Total 6 Click Score: 14    End of Session   Activity Tolerance: Other (comment);Patient limited by  lethargy(symtoms post medication) Patient left: in bed;with call bell/phone within reach;with bed alarm set   PT Visit Diagnosis: Other abnormalities of gait and mobility (R26.89);Muscle weakness (generalized) (M62.81);History of falling (Z91.81);Difficulty in walking, not elsewhere classified (R26.2);Pain     Time: 4431-5400 PT Time Calculation (min) (ACUTE ONLY): 30 min  Charges:  $Therapeutic Exercise: 23-37 mins                      Larae Grooms, PTA 09/15/2018, 3:37 PM

## 2018-09-15 NOTE — Progress Notes (Signed)
[facility] at [facility] [place] NAME: Tina Sharp    MR#:  [id number]  DATE OF BIRTH:  [birth year]  SUBJECTIVE:  CHIEF COMPLAINT:   Chief Complaint  Patient presents with  . Fall  no complaints, had BM y'day per patient REVIEW OF SYSTEMS:  Review of Systems  Constitutional: Positive for malaise/fatigue. Negative for chills and fever.  HENT: Negative for sore throat.   Eyes: Negative for blurred vision and double vision.  Respiratory: Negative for cough, hemoptysis, shortness of breath, wheezing and stridor.   Cardiovascular: Negative for chest pain, palpitations, orthopnea and leg swelling.  Gastrointestinal: Negative for abdominal pain, blood in stool, diarrhea, melena, nausea and vomiting.  Genitourinary: Negative for dysuria, flank pain and hematuria.  Musculoskeletal: Positive for back pain. Negative for joint pain.  Skin: Negative for rash.  Neurological: Positive for tremors. Negative for dizziness, sensory change, focal weakness, seizures, loss of consciousness, weakness and headaches.  Endo/Heme/Allergies: Negative for polydipsia.  Psychiatric/Behavioral: Negative for depression. The patient is nervous/anxious.     DRUG ALLERGIES:  No Known Allergies VITALS:  Blood pressure (!) 153/92, pulse 89, temperature 98.2 F (36.8 C), temperature source Oral, resp. rate 17, height 5\' 2"  (1.575 m), weight 63.5 kg, SpO2 98 %. PHYSICAL EXAMINATION:  Physical Exam Constitutional:      Appearance: Normal appearance.  HENT:     Head: Normocephalic.     Mouth/Throat:     Mouth: Mucous membranes are moist.  Eyes:     General: No scleral icterus.    Conjunctiva/sclera: Conjunctivae normal.     Pupils: Pupils are equal, round, and reactive to light.  Neck:     Musculoskeletal: Normal range of motion and neck supple.     Vascular: No JVD.     Trachea: No tracheal deviation.  Cardiovascular:     Rate and Rhythm: Normal rate and regular rhythm.       Heart sounds: Normal heart sounds. No murmur. No gallop.   Pulmonary:     Effort: Pulmonary effort is normal. No respiratory distress.     Breath sounds: Normal breath sounds. No wheezing or rales.     Comments: Coarse breath sound. Abdominal:     General: Bowel sounds are normal. There is no distension.     Palpations: Abdomen is soft.     Tenderness: There is no abdominal tenderness. There is no rebound.  Musculoskeletal: Normal range of motion.        General: No tenderness.     Right lower leg: No edema.     Left lower leg: No edema.  Skin:    Findings: No erythema or rash.  Neurological:     General: No focal deficit present.     Mental Status: She is alert and oriented to person, place, and time.     Cranial Nerves: No cranial nerve deficit.  Psychiatric:        Mood and Affect: Mood normal.    LABORATORY PANEL:  Female CBC Recent Labs  Lab 09/13/18 0446  WBC 6.0  HGB 12.5  HCT 36.8  PLT 260   ------------------------------------------------------------------------------------------------------------------ Chemistries  Recent Labs  Lab 09/12/18 1054 09/13/18 0446  NA 131* 134*  K 2.8* 4.7  CL 85* 95*  CO2 36* 32  GLUCOSE 110* 92  BUN 10 11  CREATININE 0.75 0.81  CALCIUM 8.4* 8.2*  AST 58*  --   ALT 43  --   ALKPHOS 159*  --   BILITOT  1.0  --    RADIOLOGY:  No results found. ASSESSMENT AND PLAN:   Tina Sharp  is a 53 y.o. 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  1.  Fall and T12 compression fracture-has been having falls lately, pain under control -Physical therapy evaluation:SNF. - Dr. Rudene Christians planning kyphoplasty Tomorrow/Tuesday.  Coumadin is held. Last INR on 1/31 was 1.6, will order recheck for today - will schedule miralax and colace as she is on  Narcotics - had BM y'day  2.  History of cryptogenic strokes-last stroke was a few years ago, INR is subtherapeutic -Patient on Coumadin.  Hold it for today to see if she would need kyphoplasty -Reinstate Coumadin and adjust and outpatient follow-up for therapeutic INR -On statin.    3.  Neuropathy-continue home medications  4.  History of breast cancer-status post surgery, chemoradiation.  Continue hormonal treatment  5.  Hypokalemia and hyponatremia-hold torsemide.   Improved.  6.  DVT prophylaxis-warfarin will be started after kyphoplasty if indicated.   7. Alcohol withdrawal.   on CIWA protocol.  8. Hyponatremia.  Improved.  9. Physical therapy recommends: SNF placement. CSW aware and working on it  I discussed with Dr. Rudene Christians, orthopedic surgeon.  All the records are reviewed and case discussed with Care Management/Social Worker. Management plans discussed with the patient, nursing, Dr Rudene Christians and they are in agreement.  CODE STATUS: Full Code  TOTAL TIME TAKING CARE OF THIS PATIENT: 25 minutes.   More than 50% of the time was spent in counseling/coordination of care: YES  POSSIBLE D/C IN 2-3 DAYS, DEPENDING ON CLINICAL CONDITION. And ortho pedic eval   Max Sane M.D on 09/15/2018 at 7:54 AM  Between 7am to 6pm - Pager - [phone]  After 6pm go to [email or web address] - [facility]

## 2018-09-16 ENCOUNTER — Inpatient Hospital Stay: Payer: Medicare HMO | Admitting: Anesthesiology

## 2018-09-16 ENCOUNTER — Encounter
Admission: EM | Disposition: A | Discharge status: Skilled Nursing Facility | Payer: Self-pay | Source: Home / Self Care | Attending: Internal Medicine

## 2018-09-16 ENCOUNTER — Inpatient Hospital Stay: Payer: Medicare HMO

## 2018-09-16 ENCOUNTER — Encounter: Payer: Self-pay | Admitting: *Deleted

## 2018-09-16 HISTORY — PX: KYPHOPLASTY: SHX5884

## 2018-09-16 LAB — BASIC METABOLIC PANEL
ANION GAP: 7 (ref 5–15)
BUN: 14 mg/dL (ref 6–20)
CO2: 32 mmol/L (ref 22–32)
Calcium: 9 mg/dL (ref 8.9–10.3)
Chloride: 96 mmol/L — ABNORMAL LOW (ref 98–111)
Creatinine, Ser: 0.77 mg/dL (ref 0.44–1.00)
GFR calc Af Amer: >60 mL/min (ref >60)
GFR calc non Af Amer: >60 mL/min (ref >60)
Glucose, Bld: 93 mg/dL (ref 70–99)
Potassium: 5 mmol/L (ref 3.5–5.1)
SODIUM: 135 mmol/L (ref 135–145)

## 2018-09-16 LAB — CBC
HCT: 37.4 % (ref 36.0–46.0)
Hemoglobin: 12.5 g/dL (ref 12.0–15.0)
MCH: 35 pg — ABNORMAL HIGH (ref 26.0–34.0)
MCHC: 33.4 g/dL (ref 30.0–36.0)
MCV: 104.8 fL — ABNORMAL HIGH (ref 80.0–100.0)
NRBC: 0 % (ref 0.0–0.2)
Platelets: 320 10*3/uL (ref 150–400)
RBC: 3.57 MIL/uL — ABNORMAL LOW (ref 3.87–5.11)
RDW: 15.5 % (ref 11.5–15.5)
WBC: 7.6 10*3/uL (ref 4.0–10.5)

## 2018-09-16 LAB — MRSA PCR SCREENING: MRSA by PCR: NEGATIVE

## 2018-09-16 LAB — PROTIME-INR
INR: 1.04
Prothrombin Time: 13.5 seconds (ref 11.4–15.2)

## 2018-09-16 SURGERY — KYPHOPLASTY
Anesthesia: General

## 2018-09-16 MED ORDER — LABETALOL HCL 5 MG/ML IV SOLN
INTRAVENOUS | Status: DC | PRN
  Administered 2018-09-16 (×3): 5 mg via INTRAVENOUS

## 2018-09-16 MED ORDER — MAGNESIUM HYDROXIDE 400 MG/5ML PO SUSP
30.0000 mL | Freq: Every day | ORAL | Status: DC | PRN
  Administered 2018-09-17: 30 mL via ORAL
  Filled 2018-09-16: qty 30

## 2018-09-16 MED ORDER — LACTATED RINGERS IV SOLN
INTRAVENOUS | Status: DC | PRN
  Administered 2018-09-16: 14:00:00 via INTRAVENOUS

## 2018-09-16 MED ORDER — KETAMINE HCL 10 MG/ML IJ SOLN
INTRAMUSCULAR | Status: DC | PRN
  Administered 2018-09-16 (×2): 20 mg via INTRAVENOUS
  Administered 2018-09-16: 10 mg via INTRAVENOUS

## 2018-09-16 MED ORDER — FENTANYL CITRATE (PF) 100 MCG/2ML IJ SOLN
INTRAMUSCULAR | Status: AC
  Filled 2018-09-16: qty 2

## 2018-09-16 MED ORDER — DOCUSATE SODIUM 100 MG PO CAPS
100.0000 mg | ORAL_CAPSULE | Freq: Two times a day (BID) | ORAL | Status: DC
  Administered 2018-09-16: 100 mg via ORAL
  Filled 2018-09-16: qty 1

## 2018-09-16 MED ORDER — BUPIVACAINE-EPINEPHRINE (PF) 0.5% -1:200000 IJ SOLN
INTRAMUSCULAR | Status: AC
  Filled 2018-09-16: qty 30

## 2018-09-16 MED ORDER — METOCLOPRAMIDE HCL 5 MG/ML IJ SOLN: 5.0000 mg | Freq: Three times a day (TID) | INTRAMUSCULAR | Status: DC | PRN

## 2018-09-16 MED ORDER — BUPIVACAINE-EPINEPHRINE (PF) 0.5% -1:200000 IJ SOLN
INTRAMUSCULAR | Status: DC | PRN
  Administered 2018-09-16: 10 mL

## 2018-09-16 MED ORDER — DEXAMETHASONE SODIUM PHOSPHATE 10 MG/ML IJ SOLN
INTRAMUSCULAR | Status: DC | PRN
  Administered 2018-09-16: 10 mg via INTRAVENOUS

## 2018-09-16 MED ORDER — MIDAZOLAM HCL 2 MG/2ML IJ SOLN
INTRAMUSCULAR | Status: AC
  Filled 2018-09-16: qty 2

## 2018-09-16 MED ORDER — PROPOFOL 500 MG/50ML IV EMUL
INTRAVENOUS | Status: DC | PRN
  Administered 2018-09-16: 80 ug/kg/min via INTRAVENOUS

## 2018-09-16 MED ORDER — BISACODYL 5 MG PO TBEC: 5.0000 mg | DELAYED_RELEASE_TABLET | Freq: Every day | ORAL | Status: DC | PRN

## 2018-09-16 MED ORDER — WARFARIN SODIUM 7.5 MG PO TABS
7.5000 mg | ORAL_TABLET | Freq: Once | ORAL | Status: AC
  Administered 2018-09-16: 7.5 mg via ORAL
  Filled 2018-09-16: qty 1

## 2018-09-16 MED ORDER — MIDAZOLAM HCL 2 MG/2ML IJ SOLN
INTRAMUSCULAR | Status: DC | PRN
  Administered 2018-09-16 (×2): 1 mg via INTRAVENOUS

## 2018-09-16 MED ORDER — KETAMINE HCL 50 MG/ML IJ SOLN
INTRAMUSCULAR | Status: AC
  Filled 2018-09-16: qty 10

## 2018-09-16 MED ORDER — PROPOFOL 500 MG/50ML IV EMUL
INTRAVENOUS | Status: AC
  Filled 2018-09-16: qty 50

## 2018-09-16 MED ORDER — GLYCOPYRROLATE 0.2 MG/ML IJ SOLN
INTRAMUSCULAR | Status: AC
  Filled 2018-09-16: qty 1

## 2018-09-16 MED ORDER — HYDROMORPHONE HCL 1 MG/ML IJ SOLN: 0.2500 mg | INTRAMUSCULAR | Status: DC | PRN

## 2018-09-16 MED ORDER — LIDOCAINE HCL 1 % IJ SOLN
INTRAMUSCULAR | Status: DC | PRN
  Administered 2018-09-16: 40 mL

## 2018-09-16 MED ORDER — SODIUM CHLORIDE 0.9 % IV SOLN
INTRAVENOUS | Status: DC
  Administered 2018-09-16 – 2018-09-17 (×2): via INTRAVENOUS

## 2018-09-16 MED ORDER — METOCLOPRAMIDE HCL 10 MG PO TABS: 5.0000 mg | ORAL_TABLET | Freq: Three times a day (TID) | ORAL | Status: DC | PRN

## 2018-09-16 MED ORDER — ESMOLOL HCL 100 MG/10ML IV SOLN
INTRAVENOUS | Status: DC | PRN
  Administered 2018-09-16 (×2): 10 mg via INTRAVENOUS

## 2018-09-16 MED ORDER — MAGNESIUM CITRATE PO SOLN
1.0000 | Freq: Once | ORAL | Status: DC | PRN
  Filled 2018-09-16: qty 296

## 2018-09-16 MED ORDER — GLYCOPYRROLATE 0.2 MG/ML IJ SOLN
INTRAMUSCULAR | Status: DC | PRN
  Administered 2018-09-16: 0.2 mg via INTRAVENOUS

## 2018-09-16 MED ORDER — ONDANSETRON HCL 4 MG/2ML IJ SOLN
INTRAMUSCULAR | Status: DC | PRN
  Administered 2018-09-16: 4 mg via INTRAVENOUS

## 2018-09-16 MED ORDER — IOPAMIDOL (ISOVUE-M 200) INJECTION 41%
INTRAMUSCULAR | Status: AC
  Filled 2018-09-16: qty 20

## 2018-09-16 MED ORDER — WARFARIN - PHARMACIST DOSING INPATIENT: Freq: Every day | Status: DC

## 2018-09-16 MED ORDER — LIDOCAINE HCL (PF) 1 % IJ SOLN
INTRAMUSCULAR | Status: AC
  Filled 2018-09-16: qty 60

## 2018-09-16 MED ORDER — CEFAZOLIN SODIUM-DEXTROSE 1-4 GM/50ML-% IV SOLN
1.0000 g | Freq: Four times a day (QID) | INTRAVENOUS | Status: AC
  Administered 2018-09-16 – 2018-09-17 (×3): 1 g via INTRAVENOUS
  Filled 2018-09-16 (×3): qty 50

## 2018-09-16 SURGICAL SUPPLY — 19 items
CEMENT KYPHON CX01A KIT/MIXER (Cement) ×3 IMPLANT
COVER WAND RF STERILE (DRAPES) ×3 IMPLANT
DERMABOND ADVANCED (GAUZE/BANDAGES/DRESSINGS) ×2
DERMABOND ADVANCED .7 DNX12 (GAUZE/BANDAGES/DRESSINGS) ×1 IMPLANT
DEVICE BIOPSY BONE KYPHX (INSTRUMENTS) ×3 IMPLANT
DRAPE C-ARM XRAY 36X54 (DRAPES) ×3 IMPLANT
DURAPREP 26ML APPLICATOR (WOUND CARE) ×3 IMPLANT
GLOVE SURG SYN 9.0  PF PI (GLOVE) ×2
GLOVE SURG SYN 9.0 PF PI (GLOVE) ×1 IMPLANT
GOWN SRG 2XL LVL 4 RGLN SLV (GOWNS) ×1 IMPLANT
GOWN STRL NON-REIN 2XL LVL4 (GOWNS) ×2
GOWN STRL REUS W/ TWL LRG LVL3 (GOWN DISPOSABLE) ×1 IMPLANT
GOWN STRL REUS W/TWL LRG LVL3 (GOWN DISPOSABLE) ×2
PACK KYPHOPLASTY (MISCELLANEOUS) ×3 IMPLANT
RENTAL RFA  GENERATOR (MISCELLANEOUS)
RENTAL RFA GENERATOR (MISCELLANEOUS) IMPLANT
STRAP SAFETY 5IN WIDE (MISCELLANEOUS) ×3 IMPLANT
TRAY KYPHOPAK 15/3 EXPRESS 1ST (MISCELLANEOUS) ×3 IMPLANT
TRAY KYPHOPAK 20/3 EXPRESS 1ST (MISCELLANEOUS) IMPLANT

## 2018-09-16 NOTE — Anesthesia Post-op Follow-up Note (Signed)
Anesthesia QCDR form completed.        

## 2018-09-16 NOTE — Transfer of Care (Signed)
Immediate Anesthesia Transfer of Care Note  Patient: Tina Sharp  Procedure(s) Performed: KYPHOPLASTY T12 (N/A )  Patient Location: PACU  Anesthesia Type:General  Level of Consciousness: drowsy and responds to stimulation  Airway & Oxygen Therapy: Patient Spontanous Breathing and Patient connected to face mask oxygen  Post-op Assessment: Report given to RN and Post -op Vital signs reviewed and stable  Post vital signs: Reviewed and stable  Last Vitals:  Vitals Value Taken Time  BP 103/76 09/16/2018  3:01 PM  Temp    Pulse 83 09/16/2018  3:01 PM  Resp 15 09/16/2018  3:01 PM  SpO2 99 % 09/16/2018  3:01 PM  Vitals shown include unvalidated device data.  Last Pain:  Vitals:   09/16/18 1222  TempSrc: Tympanic  PainSc: 7       Patients Stated Pain Goal: 4 (44/97/53 0051)  Complications: No apparent anesthesia complications

## 2018-09-16 NOTE — Progress Notes (Signed)
Patient having persistent back pain with acute T12 fracture unable to ambulate, plan is for kyphoplasty today

## 2018-09-16 NOTE — Progress Notes (Signed)
Per [facility] authorization has been received. Clinical Social Worker (CSW) met with patient and her father Gwyndolyn Saxon was at bedside. CSW made them aware of above. Plan is for patient to D/C to [facility] when medically stable.   [employer], LCSW [phone]

## 2018-09-16 NOTE — Progress Notes (Signed)
[facility] at [place] NAME: Tina Sharp    MR#:  [id number]  DATE OF BIRTH:  08-23-1965  SUBJECTIVE:  CHIEF COMPLAINT:   Chief Complaint  Patient presents with  . Fall  still having back pain, waiting for Kyphoplasty today REVIEW OF SYSTEMS:  Review of Systems  Constitutional: Positive for malaise/fatigue. Negative for chills and fever.  HENT: Negative for sore throat.   Eyes: Negative for blurred vision and double vision.  Respiratory: Negative for cough, hemoptysis, shortness of breath, wheezing and stridor.   Cardiovascular: Negative for chest pain, palpitations, orthopnea and leg swelling.  Gastrointestinal: Negative for abdominal pain, blood in stool, diarrhea, melena, nausea and vomiting.  Genitourinary: Negative for dysuria, flank pain and hematuria.  Musculoskeletal: Positive for back pain. Negative for joint pain.  Skin: Negative for rash.  Neurological: Positive for tremors. Negative for dizziness, sensory change, focal weakness, seizures, loss of consciousness, weakness and headaches.  Endo/Heme/Allergies: Negative for polydipsia.  Psychiatric/Behavioral: Negative for depression. The patient is nervous/anxious.    DRUG ALLERGIES:  No Known Allergies VITALS:  Blood pressure (!) 140/100, pulse 92, temperature (!) 96.8 F (36 C), temperature source Tympanic, resp. rate 18, height 5\' 2"  (1.575 m), weight 63.5 kg, SpO2 97 %. PHYSICAL EXAMINATION:  Physical Exam Constitutional:      Appearance: Normal appearance.  HENT:     Head: Normocephalic.     Mouth/Throat:     Mouth: Mucous membranes are moist.  Eyes:     General: No scleral icterus.    Conjunctiva/sclera: Conjunctivae normal.     Pupils: Pupils are equal, round, and reactive to light.  Neck:     Musculoskeletal: Normal range of motion and neck supple.     Vascular: No JVD.     Trachea: No tracheal deviation.  Cardiovascular:     Rate and Rhythm: Normal rate  and regular rhythm.     Heart sounds: Normal heart sounds. No murmur. No gallop.   Pulmonary:     Effort: Pulmonary effort is normal. No respiratory distress.     Breath sounds: Normal breath sounds. No wheezing or rales.     Comments: Coarse breath sound. Abdominal:     General: Bowel sounds are normal. There is no distension.     Palpations: Abdomen is soft.     Tenderness: There is no abdominal tenderness. There is no rebound.  Musculoskeletal: Normal range of motion.        General: No tenderness.     Right lower leg: No edema.     Left lower leg: No edema.  Skin:    Findings: No erythema or rash.  Neurological:     General: No focal deficit present.     Mental Status: She is alert and oriented to person, place, and time.     Cranial Nerves: No cranial nerve deficit.  Psychiatric:        Mood and Affect: Mood normal.    LABORATORY PANEL:  Female CBC Recent Labs  Lab 09/16/18 0326  WBC 7.6  HGB 12.5  HCT 37.4  PLT 320   ------------------------------------------------------------------------------------------------------------------ Chemistries  Recent Labs  Lab 09/12/18 1054  09/16/18 0326  NA 131*   < > 135  K 2.8*   < > 5.0  CL 85*   < > 96*  CO2 36*   < > 32  GLUCOSE 110*   < > 93  BUN 10   < > 14  CREATININE 0.75   < >  0.77  CALCIUM 8.4*   < > 9.0  AST 58*  --   --   ALT 43  --   --   ALKPHOS 159*  --   --   BILITOT 1.0  --   --    < > = values in this interval not displayed.   RADIOLOGY:  No results found. ASSESSMENT AND PLAN:  Tina Sharp  is a 53 y.o. 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  1.  Fall and T12 compression fracture-has been having falls lately, pain under control -Physical therapy evaluation:SNF. - Dr. Rudene Christians planning kyphoplasty Today  Coumadin  can be resumed post surgery if ok with Ortho  2.  History of cryptogenic strokes-last stroke was a few years ago, INR is subtherapeutic - can restart coumadin post kypho, Pharmacy to manage -On statin.    3.  Neuropathy-continue home medications  4.  History of breast cancer-status post surgery, chemoradiation.  Continue hormonal treatment  5.  Hypokalemia and hyponatremia-hold torsemide. Improved.  6.  DVT prophylaxis-warfarin will be started after kyphoplasty if indicated.   7. Alcohol withdrawal.   on CIWA protocol.  8. Hyponatremia.  Improved.  9. Physical therapy recommends: SNF placement.    [facility]   All the records are reviewed and case discussed with Care Management/Social Worker. Management plans discussed with the patient, nursing, and they are in agreement.  CODE STATUS: Full Code  TOTAL TIME TAKING CARE OF THIS PATIENT: 25 minutes.   More than 50% of the time was spent in counseling/coordination of care: YES  POSSIBLE D/C IN 1-2 DAYS, DEPENDING ON CLINICAL CONDITION. And ortho pedic eval   Max Sane M.D on 09/16/2018 at 12:55 PM  Between 7am to 6pm - Pager - [phone]  After 6pm go to [email or web address] - [facility]

## 2018-09-16 NOTE — Progress Notes (Signed)
PT Cancellation Note  Patient Details Name: Tina Sharp MRN: [id number] DOB: [birth year]   Cancelled Treatment:    Reason Eval/Treat Not Completed: Patient at procedure or test/unavailable, will attempt to see pt at a future date/time as medically appropriate.     Linus Salmons PT, DPT 09/16/18, 2:23 PM

## 2018-09-16 NOTE — Consult Note (Signed)
ANTICOAGULATION CONSULT NOTE - Initial Consult  Pharmacy Consult for Warfarin Indication: stroke  No Known Allergies  Patient Measurements: Height: 5\' 2"  (157.5 cm) Weight: 140 lb (63.5 kg) IBW/kg (Calculated) : 50.1  Vital Signs: Temp: 96.8 F (36 C) (02/04 1222) Temp Source: Tympanic (02/04 1222) BP: 140/100 (02/04 1222) Pulse Rate: 92 (02/04 1222)  Labs: Recent Labs    09/15/18 0842 09/16/18 0326  HGB  --  12.5  HCT  --  37.4  PLT  --  320  LABPROT 14.7 13.5  INR 1.16 1.04  CREATININE  --  0.77    Estimated Creatinine Clearance: 70.4 mL/min (by C-G formula based on SCr of 0.77 mg/dL).  Medical History: Patient record indicates 2.5mg  daily dosing leading up to 5 mg dosing Last home dose was 1/30 @ 1730  Assessment: Pt is subtherapeutic with an INR of 1.04  Goal of Therapy:  INR 2-3 Monitor platelets by anticoagulation protocol: Yes   Plan:  Will dose @ 50% greater than home dose per protocol (7.5mg ) Will check INR and CBC with am labs  Lu Duffel, PharmD, BCPS Clinical Pharmacist 09/16/2018 1:30 PM

## 2018-09-16 NOTE — Care Management Important Message (Signed)
Important Message  Patient Details  Name: Tina Sharp MRN: [id number] Date of Birth: 1965-12-14   Medicare Important Message Given:  Yes    Juliann Pulse A Zita Ozimek 09/16/2018, 10:42 AM

## 2018-09-16 NOTE — Op Note (Signed)
Date  09/16/2018  Time  2:57 pm   PATIENT: Tina Sharp   PRE-OPERATIVE DIAGNOSIS:  closed wedge compression fracture of T 12   POST-OPERATIVE DIAGNOSIS:  closed wedge compression fracture of T 12   PROCEDURE:  Procedure(s): KYPHOPLASTY T 12  SURGEON: Laurene Footman, MD   ASSISTANTS: None   ANESTHESIA:   local and MAC   EBL:  No intake/output data recorded.   BLOOD ADMINISTERED:none   DRAINS: none    LOCAL MEDICATIONS USED:  MARCAINE    and XYLOCAINE    SPECIMEN:  none   DISPOSITION OF SPECIMEN: n/a   COUNTS:  YES   TOURNIQUET:  * No tourniquets in log *   IMPLANTS: Bone cement   DICTATION: .Dragon Dictation  patient was brought to the operating room and after adequate anesthesia was obtained the patient was placed prone.  C arm was brought in in good visualization of the affected level obtained on both AP and lateral projections.  After patient identification and timeout procedures were completed, local anesthetic was infiltrated with 10 cc 1% Xylocaine infiltrated subcutaneously.  This is done the area on the right side of the planned approach.  The back was then prepped and draped you sterile manner and repeat timeout procedure carried out.  A spinal needle was brought down to the pedicle on the right side of T 12 and a 50-50 mix of 1% Xylocaine half percent Sensorcaine with epinephrine total of 20 cc injected.  After allowing this to set a small incision was made and the trocar was advanced into the vertebral body in an extrapedicular fashion.  Biopsy was not obtained  Drilling was carried out balloon inserted with inflation to 3 cc.  When the cement was appropriate consistency 4.5 cc were injected into the vertebral body with extravasation into anterior lateral tissue, not towards nerve root, good fill superior to inferior endplates and from right to left sides along the inferior endplate.  After the cement had set the trochar was removed and permanent C-arm views obtained.   The wound was closed with Dermabond followed by Band-Aid   PLAN OF CARE:  to floor after PACU   PATIENT DISPOSITION:  PACU - hemodynamically stable.

## 2018-09-17 ENCOUNTER — Encounter: Payer: Self-pay | Admitting: Orthopedic Surgery

## 2018-09-17 DIAGNOSIS — M4850XA Collapsed vertebra, not elsewhere classified, site unspecified, initial encounter for fracture: Secondary | ICD-10-CM | POA: Diagnosis not present

## 2018-09-17 DIAGNOSIS — S22000D Wedge compression fracture of unspecified thoracic vertebra, subsequent encounter for fracture with routine healing: Secondary | ICD-10-CM | POA: Diagnosis not present

## 2018-09-17 DIAGNOSIS — D649 Anemia, unspecified: Secondary | ICD-10-CM | POA: Diagnosis not present

## 2018-09-17 DIAGNOSIS — C4352 Malignant melanoma of skin of breast: Secondary | ICD-10-CM | POA: Diagnosis not present

## 2018-09-17 DIAGNOSIS — S22009D Unspecified fracture of unspecified thoracic vertebra, subsequent encounter for fracture with routine healing: Secondary | ICD-10-CM | POA: Diagnosis not present

## 2018-09-17 DIAGNOSIS — F419 Anxiety disorder, unspecified: Secondary | ICD-10-CM | POA: Diagnosis not present

## 2018-09-17 DIAGNOSIS — M8588 Other specified disorders of bone density and structure, other site: Secondary | ICD-10-CM | POA: Diagnosis not present

## 2018-09-17 DIAGNOSIS — F10929 Alcohol use, unspecified with intoxication, unspecified: Secondary | ICD-10-CM | POA: Diagnosis not present

## 2018-09-17 DIAGNOSIS — F1010 Alcohol abuse, uncomplicated: Secondary | ICD-10-CM | POA: Diagnosis not present

## 2018-09-17 DIAGNOSIS — F418 Other specified anxiety disorders: Secondary | ICD-10-CM | POA: Diagnosis not present

## 2018-09-17 DIAGNOSIS — S22080D Wedge compression fracture of T11-T12 vertebra, subsequent encounter for fracture with routine healing: Secondary | ICD-10-CM | POA: Diagnosis not present

## 2018-09-17 DIAGNOSIS — N390 Urinary tract infection, site not specified: Secondary | ICD-10-CM | POA: Diagnosis not present

## 2018-09-17 DIAGNOSIS — R270 Ataxia, unspecified: Secondary | ICD-10-CM | POA: Diagnosis not present

## 2018-09-17 DIAGNOSIS — G609 Hereditary and idiopathic neuropathy, unspecified: Secondary | ICD-10-CM | POA: Diagnosis not present

## 2018-09-17 DIAGNOSIS — I4891 Unspecified atrial fibrillation: Secondary | ICD-10-CM | POA: Diagnosis not present

## 2018-09-17 DIAGNOSIS — K219 Gastro-esophageal reflux disease without esophagitis: Secondary | ICD-10-CM | POA: Diagnosis not present

## 2018-09-17 DIAGNOSIS — M6281 Muscle weakness (generalized): Secondary | ICD-10-CM | POA: Diagnosis not present

## 2018-09-17 DIAGNOSIS — Z7901 Long term (current) use of anticoagulants: Secondary | ICD-10-CM | POA: Diagnosis not present

## 2018-09-17 DIAGNOSIS — Z8673 Personal history of transient ischemic attack (TIA), and cerebral infarction without residual deficits: Secondary | ICD-10-CM | POA: Diagnosis not present

## 2018-09-17 DIAGNOSIS — M2550 Pain in unspecified joint: Secondary | ICD-10-CM | POA: Diagnosis not present

## 2018-09-17 DIAGNOSIS — Z7401 Bed confinement status: Secondary | ICD-10-CM | POA: Diagnosis not present

## 2018-09-17 DIAGNOSIS — G464 Cerebellar stroke syndrome: Secondary | ICD-10-CM | POA: Diagnosis not present

## 2018-09-17 DIAGNOSIS — W19XXXA Unspecified fall, initial encounter: Secondary | ICD-10-CM | POA: Diagnosis not present

## 2018-09-17 DIAGNOSIS — I10 Essential (primary) hypertension: Secondary | ICD-10-CM | POA: Diagnosis not present

## 2018-09-17 DIAGNOSIS — M1611 Unilateral primary osteoarthritis, right hip: Secondary | ICD-10-CM | POA: Diagnosis not present

## 2018-09-17 DIAGNOSIS — I639 Cerebral infarction, unspecified: Secondary | ICD-10-CM | POA: Diagnosis not present

## 2018-09-17 DIAGNOSIS — Z853 Personal history of malignant neoplasm of breast: Secondary | ICD-10-CM | POA: Diagnosis not present

## 2018-09-17 DIAGNOSIS — I482 Chronic atrial fibrillation, unspecified: Secondary | ICD-10-CM | POA: Diagnosis not present

## 2018-09-17 DIAGNOSIS — E785 Hyperlipidemia, unspecified: Secondary | ICD-10-CM | POA: Diagnosis not present

## 2018-09-17 DIAGNOSIS — Z9889 Other specified postprocedural states: Secondary | ICD-10-CM | POA: Diagnosis not present

## 2018-09-17 LAB — CBC
HCT: 38 % (ref 36.0–46.0)
Hemoglobin: 12.5 g/dL (ref 12.0–15.0)
MCH: 34.8 pg — ABNORMAL HIGH (ref 26.0–34.0)
MCHC: 32.9 g/dL (ref 30.0–36.0)
MCV: 105.8 fL — AB (ref 80.0–100.0)
Platelets: 326 10*3/uL (ref 150–400)
RBC: 3.59 MIL/uL — ABNORMAL LOW (ref 3.87–5.11)
RDW: 15.8 % — ABNORMAL HIGH (ref 11.5–15.5)
WBC: 10.1 10*3/uL (ref 4.0–10.5)
nRBC: 0 % (ref 0.0–0.2)

## 2018-09-17 LAB — PROTIME-INR
INR: 1.06
Prothrombin Time: 13.7 seconds (ref 11.4–15.2)

## 2018-09-17 MED ORDER — CLONAZEPAM 0.5 MG PO TABS: 0.5000 mg | ORAL_TABLET | Freq: Two times a day (BID) | ORAL | 0 refills | Status: AC | PRN

## 2018-09-17 MED ORDER — HYDROCODONE-ACETAMINOPHEN 5-325 MG PO TABS: 1.0000 | ORAL_TABLET | Freq: Three times a day (TID) | ORAL | Status: DC | PRN

## 2018-09-17 MED ORDER — CLONAZEPAM 0.5 MG PO TBDP: 0.5000 mg | ORAL_TABLET | Freq: Two times a day (BID) | ORAL | 0 refills | Status: DC

## 2018-09-17 MED ORDER — BISACODYL 5 MG PO TBEC
5.0000 mg | DELAYED_RELEASE_TABLET | ORAL | Status: AC
  Administered 2018-09-17: 5 mg via ORAL
  Filled 2018-09-17: qty 1

## 2018-09-17 MED ORDER — IBUPROFEN 400 MG PO TABS: 400.0000 mg | ORAL_TABLET | Freq: Four times a day (QID) | ORAL | 0 refills | Status: DC | PRN

## 2018-09-17 MED ORDER — HYDROCODONE-ACETAMINOPHEN 5-325 MG PO TABS: 1.0000 | ORAL_TABLET | Freq: Two times a day (BID) | ORAL | 0 refills | Status: DC | PRN

## 2018-09-17 MED ORDER — CLONAZEPAM 0.5 MG PO TABS: 0.5000 mg | ORAL_TABLET | Freq: Two times a day (BID) | ORAL | 0 refills | Status: DC | PRN

## 2018-09-17 MED ORDER — WARFARIN SODIUM 7.5 MG PO TABS: 7.5000 mg | ORAL_TABLET | Freq: Once | ORAL | Status: DC

## 2018-09-17 MED ORDER — WARFARIN SODIUM 5 MG PO TABS: 5.0000 mg | ORAL_TABLET | Freq: Once | ORAL | Status: DC

## 2018-09-17 MED ORDER — SENNOSIDES-DOCUSATE SODIUM 8.6-50 MG PO TABS
2.0000 | ORAL_TABLET | ORAL | Status: AC
  Administered 2018-09-17: 2 via ORAL
  Filled 2018-09-17: qty 2

## 2018-09-17 MED ORDER — DOCUSATE SODIUM 100 MG PO CAPS: 200.0000 mg | ORAL_CAPSULE | ORAL | Status: DC

## 2018-09-17 MED ORDER — WARFARIN SODIUM 5 MG PO TABS
5.0000 mg | ORAL_TABLET | Freq: Once | ORAL | Status: DC
  Filled 2018-09-17: qty 1

## 2018-09-17 NOTE — Discharge Summary (Signed)
[place] [facility] [place] NAME: Tina Sharp    MR#:  [id number]  DATE OF BIRTH:  [birth year]  DATE OF ADMISSION:  09/12/2018   ADMITTING PHYSICIAN: Gladstone Lighter, MD  DATE OF DISCHARGE: 09/17/2018  PRIMARY CARE PHYSICIAN: Kirk Ruths, MD   ADMISSION DIAGNOSIS:  Compression fracture of body of thoracic vertebra ([place]) [S22.000A] DISCHARGE DIAGNOSIS:  Active Problems:   Fall   Alcohol withdrawal ([place])  SECONDARY DIAGNOSIS:   Past Medical History:  Diagnosis Date  . Anxiety   . 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 ER/ PR+; Her 2 neu not overexpressed, microscopic positive margin (skin). Oncotype DX, low risk for recurrence.  . Depression   . Genetic screening 05/05/2014   negative /[facility]  . GERD (gastroesophageal reflux disease)   . Hypertension   . 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  1. Fall and T12 compression fracture-s/p Kyphoplasty by Dr Rudene Christians 09/16/2018  2. History of cryptogenic strokes-last stroke was a few years ago, INR is subtherapeutic - coumadin restarted. Please check INR more frequently until INR therapeutic (check PT-INR on 2/7 & 2/10 with results to PCP for coumadin dose adjustment)  3. Neuropathy-continue home medications  4. History of breast cancer-status post surgery, chemoradiation. Continue hormonal treatment  5. Hypokalemia and hyponatremia-Resume torsemide. Improved.  6. DVT prophylaxis-warfarin will be started after kyphoplasty if indicated.   7. Alcohol withdrawal - resolved  8. Hyponatremia.   Improved.  9. Physical therapy recommends: going to [facility]  DISCHARGE CONDITIONS:  stable CONSULTS OBTAINED:  Treatment Team:  Hessie Knows, MD DRUG ALLERGIES:  No Known Allergies DISCHARGE MEDICATIONS:   Allergies as of 09/17/2018   No Known Allergies     Medication List    TAKE these medications   anastrozole 1 MG tablet Commonly known as:  ARIMIDEX Take 1 tablet (1 mg total) by mouth every evening.   atorvastatin 40 MG tablet Commonly known as:  LIPITOR Take 1 tablet (40 mg total) by mouth daily at 6 PM.   baclofen 10 MG tablet Commonly known as:  LIORESAL Take 10 mg by mouth 3 (three) times daily.   clonazePAM 0.5 MG disintegrating tablet Commonly known as:  KLONOPIN Take 1 tablet (0.5 mg total) by mouth 2 (two) times daily.   cyanocobalamin 1000 MCG tablet Take 1 tablet (1,000 mcg total) by mouth daily.   DULoxetine 60 MG capsule Commonly known as:  CYMBALTA Take 60 mg by mouth daily.   gabapentin 600 MG tablet Commonly known as:  NEURONTIN Take 600 mg by mouth 4 (four) times daily.   HYDROcodone-acetaminophen 5-325 MG tablet Commonly known as:  NORCO/VICODIN Take 1 tablet by mouth every 12 (twelve) hours as needed for severe pain.   ibuprofen 400 MG tablet Commonly known as:  ADVIL,MOTRIN Take 1 tablet (400 mg total) by mouth every 6 (six) hours as needed.   omeprazole 20 MG capsule Commonly known as:  PRILOSEC Take 20 mg by mouth as needed (heart burn).   potassium chloride 10 MEQ tablet Commonly known as:  K-DUR Take 10 mEq by mouth daily.   prochlorperazine 5 MG tablet Commonly known as:  COMPAZINE Take 5 mg by mouth every 6 (six) hours as needed for nausea or vomiting.   torsemide 20 MG tablet Commonly known as:  DEMADEX Take 20 mg by mouth daily.   warfarin 5 MG tablet Commonly known as:  COUMADIN Take 1 tablet (5 mg total) by mouth one time only at 6 PM for 1 dose. What changed:    how much to take  when to take  this  additional instructions        DISCHARGE INSTRUCTIONS:   DIET:  Regular diet DISCHARGE CONDITION:  Stable ACTIVITY:  Activity as tolerated OXYGEN:  Home Oxygen: No.  Oxygen Delivery: room air DISCHARGE LOCATION:  nursing home   If you experience worsening of your admission symptoms, develop shortness of breath, life threatening emergency, suicidal or homicidal thoughts you must seek medical attention immediately by calling 911 or calling your MD immediately  if symptoms less severe.  You Must read complete instructions/literature along with all the possible adverse reactions/side effects for all the Medicines you take and that have been prescribed to you. Take any new Medicines after you have completely understood and accpet all the possible adverse reactions/side effects.   Please note  You were cared for by a hospitalist during your hospital stay. If you have any questions about your discharge medications or the care you received while you were in the hospital after you are discharged, you can call the unit and asked to speak with the hospitalist on call if the hospitalist that took care of you is not available. Once you are discharged, your primary care physician will handle any further medical issues. Please note that NO REFILLS for any discharge medications will be authorized once you are discharged, as it is imperative that you return to your primary care physician (or establish a relationship with a primary care physician if you do not have one) for your aftercare needs so that they can reassess your need for medications and monitor your lab values.    On the day of Discharge:  VITAL SIGNS:  Blood pressure (!) 145/78, pulse 85, temperature 98.1 F (36.7 C), temperature source Oral, resp. rate 18, height 5\' 2"  (1.575 m), weight 63.5 kg, SpO2 100 %. PHYSICAL EXAMINATION:  GENERAL:  53 y.o.-year-old patient lying in the bed with no acute distress.  EYES: Pupils equal,  round, reactive to light and accommodation. No scleral icterus. Extraocular muscles intact.  HEENT: Head atraumatic, normocephalic. Oropharynx and nasopharynx clear.  NECK:  Supple, no jugular venous distention. No thyroid enlargement, no tenderness.  LUNGS: Normal breath sounds bilaterally, no wheezing, rales,rhonchi or crepitation. No use of accessory muscles of respiration.  CARDIOVASCULAR: S1, S2 normal. No murmurs, rubs, or gallops.  ABDOMEN: Soft, non-tender, non-distended. Bowel sounds present. No organomegaly or mass.  EXTREMITIES: No pedal edema, cyanosis, or clubbing.  NEUROLOGIC: Cranial nerves II through XII are intact. Muscle strength 5/5 in all extremities. Sensation intact. Gait not checked.  PSYCHIATRIC: The patient is alert and oriented x 3.  SKIN: No obvious rash, lesion, or ulcer.  DATA REVIEW:   CBC Recent Labs  Lab 09/17/18 0444  WBC 10.1  HGB 12.5  HCT 38.0  PLT 326    Chemistries  Recent Labs  Lab 09/12/18 1054  09/16/18 0326  NA 131*   < > 135  K 2.8*   < > 5.0  CL 85*   < > 96*  CO2 36*   < > 32  GLUCOSE 110*   < >  93  BUN 10   < > 14  CREATININE 0.75   < > 0.77  CALCIUM 8.4*   < > 9.0  AST 58*  --   --   ALT 43  --   --   ALKPHOS 159*  --   --   BILITOT 1.0  --   --    < > = values in this interval not displayed.      Contact information for follow-up providers    Hessie Knows, MD On 10/01/2018.   Specialty:  Orthopedic Surgery Why:  at 1030 Contact information: [place] [address] [phone]        Kirk Ruths, MD. Schedule an appointment as soon as possible for a visit in 2 day(s).   Specialty:  Internal Medicine Contact information: [place] [address] [phone]            Contact information for after-discharge care    Destination    [facility] Preferred SNF .   Service:  Skilled  Nursing Contact information: [place] [phone]                    RADIOLOGY:  Dg Thoracic Spine 2 View  Result Date: 09/16/2018 CLINICAL DATA:  T12 compression fracture. Intraoperative imaging for vertebral augmentation. EXAM: THORACIC SPINE 2 VIEWS; DG C-ARM 61-120 MIN COMPARISON:  None. FINDINGS: Two intraoperative images demonstrate methylmethacrylate within a T12 vertebral fracture. There is some cement extravasated posterolaterally to the right, outside of the spinal canal. The majority of cement appears to be within the superior cleft. IMPRESSION: 1. Vertebral augmentation at T12 with extravasation of cement laterally and posteriorly, at outside of the spinal canal. Electronically Signed   By: San Morelle M.D.   On: 09/16/2018 15:13   Dg C-arm 1-60 Min  Result Date: 09/16/2018 CLINICAL DATA:  T12 compression fracture. Intraoperative imaging for vertebral augmentation. EXAM: THORACIC SPINE 2 VIEWS; DG C-ARM 61-120 MIN COMPARISON:  None. FINDINGS: Two intraoperative images demonstrate methylmethacrylate within a T12 vertebral fracture. There is some cement extravasated posterolaterally to the right, outside of the spinal canal. The majority of cement appears to be within the superior cleft. IMPRESSION: 1. Vertebral augmentation at T12 with extravasation of cement laterally and posteriorly, at outside of the spinal canal. Electronically Signed   By: San Morelle M.D.   On: 09/16/2018 15:13     Management plans discussed with the patient, family and they are in agreement.  CODE STATUS: Full Code   TOTAL TIME TAKING CARE OF THIS PATIENT: 45 minutes.    Max Sane M.D on 09/17/2018 at 9:36 AM  Between 7am to 6pm - Pager - [phone]  After 6pm go to [email or web address] - Technical brewer [facility]  Office  [phone]  CC: Primary care physician; Kirk Ruths, MD   Note: This  dictation was prepared with Dragon dictation along with smaller phrase technology. Any transcriptional errors that result from this process are unintentional.

## 2018-09-17 NOTE — Anesthesia Postprocedure Evaluation (Signed)
Anesthesia Post Note  Patient: Tina Sharp  Procedure(s) Performed: KYPHOPLASTY T12 (N/A )  Patient location during evaluation: PACU Anesthesia Type: General Level of consciousness: awake and alert Pain management: pain level controlled Vital Signs Assessment: post-procedure vital signs reviewed and stable Respiratory status: spontaneous breathing, nonlabored ventilation and respiratory function stable Cardiovascular status: blood pressure returned to baseline and stable Postop Assessment: no apparent nausea or vomiting Anesthetic complications: no     Last Vitals:  Vitals:   09/16/18 2253 09/17/18 0758  BP: 125/66 (!) 145/78  Pulse: 93 85  Resp:  18  Temp: 36.7 C 36.7 C  SpO2: 99% 100%    Last Pain:  Vitals:   09/17/18 0811  TempSrc:   PainSc: [place]

## 2018-09-17 NOTE — Discharge Instructions (Signed)
Percutaneous Vertebroplasty  Percutaneous vertebroplasty is a procedure that is performed to treat collapsed bones (compression fractures) in the spine. Spine (vertebral) fractures can be painful and may limit movement. In this procedure, bone cement is injected into the collapsed bone in order to stabilize it. This restores the vertebra and helps to prevent further collapse. Tell a health care provider about:  Any allergies you have.  All medicines you are taking, including vitamins, herbs, eye drops, creams, and over-the-counter medicines.  Any problems you or family members have had with anesthetic medicines.  Any blood disorders you have.  Any surgeries you have had.  Whether you are pregnant or may be pregnant. What are the risks? Generally, this is a safe procedure. However, problems may occur, including:  Bleeding.  Allergic reactions to medicines or dyes.  Infection.  Damage to other structures or organs.  Need for another surgery.  Bone cement leakage.  Blood clots.  Paralysis (rare). What happens before the procedure? Staying hydrated Follow instructions from your health care provider about hydration, which may include:  Up to 2 hours before the procedure - you may continue to drink clear liquids, such as water, clear fruit juice, black coffee, and plain tea. Eating and drinking restrictions Follow instructions from your health care provider about eating and drinking, which may include:  8 hours before the procedure - stop eating heavy meals or foods such as meat, fried foods, or fatty foods.  6 hours before the procedure - stop eating light meals or foods, such as toast or cereal.  6 hours before the procedure - stop drinking milk or drinks that contain milk.  2 hours before the procedure - stop drinking clear liquids. Medicines  Ask your health care provider about: ? Changing or stopping your regular medicines. This is especially important if you are  taking diabetes medicines or blood thinners. ? Taking over-the-counter medicines, vitamins, herbs, and supplements. ? Taking medicines such as aspirin and ibuprofen. These medicines can thin your blood. Do not take these medicines unless your health care provider tells you to take them.  You may be given antibiotic medicine to help prevent infection.  You may need to take medicine to help make your bones stronger. This will help prepare you for the procedure. General instructions  Ask your health care provider how your surgical site will be marked or identified.  Plan to have someone take you home from the hospital or clinic.  Plan to have a responsible adult care for you for at least 24 hours after you leave the hospital or clinic. This is important.  You may be asked to shower with a germ-killing soap.  You may have an exam or testing. What happens during the procedure?  To lower your risk of infection: ? Your health care team will wash or sanitize their hands. ? Your skin will be washed with soap. ? Hair may be removed from the surgical area.  An IV will be inserted into one of your veins.  You will be given one or more of the following: ? A medicine to help you relax (sedative). ? A medicine to make you fall asleep (general anesthetic). ? A medicine to numb the area (local anesthetic).  You will lie face down for the procedure.  A small cut (incision) will be made in the skin right above the fractured vertebra.  A hollow needle will be inserted through the incision. An X-ray machine (fluoroscope) will be used to guide the needle to  the fractured vertebra.  Bone cement will be injected through the hollow needle into the fractured vertebra.  A bandage (dressing) will be placed over the incision site. The procedure may vary among health care providers and hospitals. What happens after the procedure?  Your blood pressure, heart rate, breathing rate, and blood oxygen level  will be monitored until the medicines you were given have worn off.  Do not drive for 24 hours if you were given a sedative. Summary  Percutaneous vertebroplasty is a procedure that is performed to treat collapsed bones (compression fractures) in the spine.  Bone cement is injected into the collapsed bone to restore and prevent further collapse.  You may need to take medicine to help make your bones stronger. This will help prepare you for the procedure.  Plan to have someone take you home from the hospital or clinic after the procedure. This information is not intended to replace advice given to you by your health care provider. Make sure you discuss any questions you have with your health care provider. Document Released: 03/28/2011 Document Revised: 10/08/2016 Document Reviewed: 10/08/2016 Elsevier Interactive Patient Education  2019 [employer].

## 2018-09-17 NOTE — Progress Notes (Addendum)
Patient is medically stable for D/C to [facility] today. Per [facility] authorization has been received and patient can come today to room 606. RN will call report and arrange EMS for transport. Clinical Education officer, museum (CSW) sent D/C orders to [facility] via [facility]. Patient is aware of above. CSW left patient's mother Tina Sharp and father Tina Sharp. Please reconsult if future social work needs arise. CSW signing off.   Patient's father Tina Sharp called CSW back and is aware of above.   [employer], LCSW [phone]

## 2018-09-17 NOTE — Consult Note (Addendum)
ANTICOAGULATION CONSULT NOTE - Initial Consult  Pharmacy Consult for Warfarin Indication: stroke  No Known Allergies  Patient Measurements: Height: 5\' 2"  (157.5 cm) Weight: 140 lb (63.5 kg) IBW/kg (Calculated) : 50.1  Vital Signs: Temp: 98.1 F (36.7 C) (02/04 2253) Temp Source: Oral (02/04 2253) BP: 125/66 (02/04 2253) Pulse Rate: 93 (02/04 2253)  Labs: Recent Labs    09/15/18 0842 09/16/18 0326 09/17/18 0444  HGB  --  12.5 12.5  HCT  --  37.4 38.0  PLT  --  320 326  LABPROT 14.7 13.5 13.7  INR 1.16 1.04 1.06  CREATININE  --  0.77  --     Estimated Creatinine Clearance: 70.4 mL/min (by C-G formula based on SCr of 0.77 mg/dL).  Medical History: Patient record indicates 2.5mg  daily dosing leading up to 5 mg dosing (other records indicate stable home dose of 2mg  daily - will confirm with pt-confirmed 2/5@1256 )  Last home dose was 1/30 @ 1730  Assessment: Pt is subtherapeutic with an INR of 1.06  1/30-2/3 No warfarin 2/4 INR 1.04 AM Dosed 7.5mg  PM 2/5 INR 1.06 AM Dosed 5mg  PM  Goal of Therapy:  INR 2-3 Monitor platelets by anticoagulation protocol: Yes   Plan:  Will dose @ 5mg  for one day, possibly dropping back to 2 mg dose tomorrow if INR starts to climb   Will check INR and CBC with am labs   (addendum - pt discharging today - stopped by her room and double checked with her about her home plan after discharge - she will resume home dose of 2mg  daily and schedule appt to recheck INR soon at clinic)   Lu Duffel, PharmD, BCPS Clinical Pharmacist 09/17/2018 7:47 AM

## 2018-09-17 NOTE — Clinical Social Work Placement (Addendum)
   CLINICAL SOCIAL WORK PLACEMENT  NOTE  Date:  09/17/2018  Patient Details  Name: Tina Sharp MRN: [id number] Date of Birth: 09/21/1965  Clinical Social Work is seeking post-discharge placement for this patient at the [place] level of care (*CSW will initial, date and re-position this form in  chart as items are completed):  Yes   Patient/family provided with [facility] Work Department's list of facilities offering this level of care within the geographic area requested by the patient (or if unable, by the patient's family).  Yes   Patient/family informed of their freedom to choose among providers that offer the needed level of care, that participate in Medicare, Medicaid or managed care program needed by the patient, have an available bed and are willing to accept the patient.  Yes   Patient/family informed of [facility]'s ownership interest in [facility] and [facility], as well as of the fact that they are under no obligation to receive care at these facilities.  PASRR submitted to EDS on       PASRR number received on       Existing PASRR number confirmed on 09/14/18     FL2 transmitted to all facilities in geographic area requested by pt/family on 09/14/18     FL2 transmitted to all facilities within larger geographic area on       Patient informed that his/her managed care company has contracts with or will negotiate with certain facilities, including the following:        Yes   Patient/family informed of bed offers received.  Patient chooses bed at ([facility])     Physician recommends and patient chooses bed at      Patient to be transferred to ([facility] ) on 09/17/18.  Patient to be transferred to facility by [facility] )     Patient family notified on 09/17/18 of transfer.  Name of family member notified:  ([place] left patient's mother Pamala Hurry and father Shelly Coss. ) Patient's father Gwyndolyn Saxon called CSW back and  is aware of D/C today.   PHYSICIAN       Additional Comment:    _______________________________________________ Alamin Mccuiston, Veronia Beets, LCSW 09/17/2018, 11:50 AM

## 2018-09-20 DIAGNOSIS — K219 Gastro-esophageal reflux disease without esophagitis: Secondary | ICD-10-CM | POA: Diagnosis not present

## 2018-09-20 DIAGNOSIS — Z8673 Personal history of transient ischemic attack (TIA), and cerebral infarction without residual deficits: Secondary | ICD-10-CM | POA: Diagnosis not present

## 2018-09-20 DIAGNOSIS — Z853 Personal history of malignant neoplasm of breast: Secondary | ICD-10-CM | POA: Diagnosis not present

## 2018-09-20 DIAGNOSIS — F418 Other specified anxiety disorders: Secondary | ICD-10-CM | POA: Diagnosis not present

## 2018-09-20 DIAGNOSIS — S22000D Wedge compression fracture of unspecified thoracic vertebra, subsequent encounter for fracture with routine healing: Secondary | ICD-10-CM | POA: Diagnosis not present

## 2018-09-20 DIAGNOSIS — E785 Hyperlipidemia, unspecified: Secondary | ICD-10-CM | POA: Diagnosis not present

## 2018-09-20 DIAGNOSIS — I10 Essential (primary) hypertension: Secondary | ICD-10-CM | POA: Diagnosis not present

## 2018-09-26 DIAGNOSIS — E785 Hyperlipidemia, unspecified: Secondary | ICD-10-CM | POA: Diagnosis not present

## 2018-09-26 DIAGNOSIS — F418 Other specified anxiety disorders: Secondary | ICD-10-CM | POA: Diagnosis not present

## 2018-09-26 DIAGNOSIS — S22000D Wedge compression fracture of unspecified thoracic vertebra, subsequent encounter for fracture with routine healing: Secondary | ICD-10-CM | POA: Diagnosis not present

## 2018-09-26 DIAGNOSIS — K219 Gastro-esophageal reflux disease without esophagitis: Secondary | ICD-10-CM | POA: Diagnosis not present

## 2018-09-26 DIAGNOSIS — I10 Essential (primary) hypertension: Secondary | ICD-10-CM | POA: Diagnosis not present

## 2018-10-01 DIAGNOSIS — M1611 Unilateral primary osteoarthritis, right hip: Secondary | ICD-10-CM | POA: Diagnosis not present

## 2018-10-01 DIAGNOSIS — Z9889 Other specified postprocedural states: Secondary | ICD-10-CM | POA: Diagnosis not present

## 2018-10-07 DIAGNOSIS — Z8673 Personal history of transient ischemic attack (TIA), and cerebral infarction without residual deficits: Secondary | ICD-10-CM | POA: Diagnosis not present

## 2018-10-07 DIAGNOSIS — S22080D Wedge compression fracture of T11-T12 vertebra, subsequent encounter for fracture with routine healing: Secondary | ICD-10-CM | POA: Diagnosis not present

## 2018-10-07 DIAGNOSIS — F418 Other specified anxiety disorders: Secondary | ICD-10-CM | POA: Diagnosis not present

## 2018-10-07 DIAGNOSIS — Z853 Personal history of malignant neoplasm of breast: Secondary | ICD-10-CM | POA: Diagnosis not present

## 2018-10-07 DIAGNOSIS — I10 Essential (primary) hypertension: Secondary | ICD-10-CM | POA: Diagnosis not present

## 2018-10-07 DIAGNOSIS — E785 Hyperlipidemia, unspecified: Secondary | ICD-10-CM | POA: Diagnosis not present

## 2018-10-07 DIAGNOSIS — K219 Gastro-esophageal reflux disease without esophagitis: Secondary | ICD-10-CM | POA: Diagnosis not present

## 2018-10-14 DIAGNOSIS — M8588 Other specified disorders of bone density and structure, other site: Secondary | ICD-10-CM | POA: Diagnosis not present

## 2018-10-17 DIAGNOSIS — K219 Gastro-esophageal reflux disease without esophagitis: Secondary | ICD-10-CM | POA: Diagnosis not present

## 2018-10-17 DIAGNOSIS — I10 Essential (primary) hypertension: Secondary | ICD-10-CM | POA: Diagnosis not present

## 2018-10-17 DIAGNOSIS — E785 Hyperlipidemia, unspecified: Secondary | ICD-10-CM | POA: Diagnosis not present

## 2018-10-17 DIAGNOSIS — F418 Other specified anxiety disorders: Secondary | ICD-10-CM | POA: Diagnosis not present

## 2018-10-17 DIAGNOSIS — S22000D Wedge compression fracture of unspecified thoracic vertebra, subsequent encounter for fracture with routine healing: Secondary | ICD-10-CM | POA: Diagnosis not present

## 2018-10-17 DIAGNOSIS — Z8673 Personal history of transient ischemic attack (TIA), and cerebral infarction without residual deficits: Secondary | ICD-10-CM | POA: Diagnosis not present

## 2018-10-17 DIAGNOSIS — Z853 Personal history of malignant neoplasm of breast: Secondary | ICD-10-CM | POA: Diagnosis not present

## 2018-10-21 ENCOUNTER — Other Ambulatory Visit: Payer: Self-pay

## 2018-10-21 NOTE — Patient Outreach (Addendum)
[facility] [facility]) Care Management  10/21/2018  AILEN STRAUCH [birth year] [id number]     Transition of Care Referral  Referral Date: 10/21/2018 Referral Source: [employer] Discharge Report Date of Admission: 09/17/2018 Diagnosis: "wedge comp fx" Date of Discharge: 10/20/2018 Facility: [facility] Insurance: [facility]    Outreach attempt # 1 to patient. No answer. RN CM left HIPAA compliant voicemail message along with contact info.    Plan: RN CM will make outreach attempt to patient within 3-4 business days. RN CM will send unsuccessful outreach letter to patient.   Enzo Montgomery, RN,BSN,CCM [place] Management Telephonic Care Management Coordinator Direct Phone: [phone] Toll Free: [phone] Fax: [phone]

## 2018-10-21 NOTE — Patient Outreach (Signed)
[place] [facility]) Care Management  10/21/2018  Tina Sharp 27-Jul-1966 [id number]   Transition of Care Referral  Referral Date: 10/21/2018 Referral Source: [employer] Discharge Report Date of Admission: 09/17/2018 Diagnosis: "wedge comp fx" Date of Discharge: 10/20/2018 Facility: [facility] Insurance: [employer] Medicare   Incoming call from patient returning RN CM call. Patient states she is doing fairly well. She has supportive parents in the home with her to assist her as needed. She voices that she is still having some pain but it is managed. RN CM reviewed pain mgmt with patient and educated on the importance of staying on top of pain as well as possible SEs from pain meds. Patient states a little nausea at times but has prn med in the home. She has not called and made MD follow up appt but will do so. She has d/c paperwork in the home and reviewed some of the info with patient and her mother. She voices she is awaiting a new walker to be delivered. Patient reports [facility] services ordered. She has contact info and knows to follow up if she does not hear anything from them. No further RN CM needs or concerns identified at this time. Patient provided with contact info to call for any future needs or concerns. She voiced understanding and was appreciative of follow up call.    Plan: RN CM will close case at this time.   Enzo Montgomery, RN,BSN,CCM [place] Management Telephonic Care Management Coordinator Direct Phone: [phone] Toll Free: [phone] Fax: [phone]

## 2018-10-22 DIAGNOSIS — I69332 Monoplegia of upper limb following cerebral infarction affecting left dominant side: Secondary | ICD-10-CM | POA: Diagnosis not present

## 2018-10-22 DIAGNOSIS — I69398 Other sequelae of cerebral infarction: Secondary | ICD-10-CM | POA: Diagnosis not present

## 2018-10-22 DIAGNOSIS — C4352 Malignant melanoma of skin of breast: Secondary | ICD-10-CM | POA: Diagnosis not present

## 2018-10-22 DIAGNOSIS — M6281 Muscle weakness (generalized): Secondary | ICD-10-CM | POA: Diagnosis not present

## 2018-10-24 DIAGNOSIS — Z87891 Personal history of nicotine dependence: Secondary | ICD-10-CM | POA: Diagnosis not present

## 2018-10-24 DIAGNOSIS — W01198D Fall on same level from slipping, tripping and stumbling with subsequent striking against other object, subsequent encounter: Secondary | ICD-10-CM | POA: Diagnosis not present

## 2018-10-24 DIAGNOSIS — S8255XD Nondisplaced fracture of medial malleolus of left tibia, subsequent encounter for closed fracture with routine healing: Secondary | ICD-10-CM | POA: Diagnosis not present

## 2018-10-24 DIAGNOSIS — Z5181 Encounter for therapeutic drug level monitoring: Secondary | ICD-10-CM | POA: Diagnosis not present

## 2018-10-24 DIAGNOSIS — Z7901 Long term (current) use of anticoagulants: Secondary | ICD-10-CM | POA: Diagnosis not present

## 2018-10-24 DIAGNOSIS — E7211 Homocystinuria: Secondary | ICD-10-CM | POA: Diagnosis not present

## 2018-10-24 DIAGNOSIS — Z8673 Personal history of transient ischemic attack (TIA), and cerebral infarction without residual deficits: Secondary | ICD-10-CM | POA: Diagnosis not present

## 2018-10-24 DIAGNOSIS — S22080D Wedge compression fracture of T11-T12 vertebra, subsequent encounter for fracture with routine healing: Secondary | ICD-10-CM | POA: Diagnosis not present

## 2018-10-24 DIAGNOSIS — M8000XD Age-related osteoporosis with current pathological fracture, unspecified site, subsequent encounter for fracture with routine healing: Secondary | ICD-10-CM | POA: Diagnosis not present

## 2018-10-24 DIAGNOSIS — S82245D Nondisplaced spiral fracture of shaft of left tibia, subsequent encounter for closed fracture with routine healing: Secondary | ICD-10-CM | POA: Diagnosis not present

## 2018-10-27 ENCOUNTER — Emergency Department: Payer: Medicare HMO

## 2018-10-27 ENCOUNTER — Encounter: Payer: Self-pay | Admitting: Emergency Medicine

## 2018-10-27 ENCOUNTER — Other Ambulatory Visit: Payer: Self-pay

## 2018-10-27 ENCOUNTER — Emergency Department
Admission: EM | Admit: 2018-10-27 | Discharge: 2018-10-27 | Disposition: A | Discharge status: Home / Self Care | Payer: Medicare HMO | Attending: Emergency Medicine | Admitting: Emergency Medicine

## 2018-10-27 DIAGNOSIS — Y9389 Activity, other specified: Secondary | ICD-10-CM | POA: Diagnosis not present

## 2018-10-27 DIAGNOSIS — S82245A Nondisplaced spiral fracture of shaft of left tibia, initial encounter for closed fracture: Secondary | ICD-10-CM | POA: Diagnosis not present

## 2018-10-27 DIAGNOSIS — S0990XA Unspecified injury of head, initial encounter: Secondary | ICD-10-CM | POA: Diagnosis not present

## 2018-10-27 DIAGNOSIS — Y929 Unspecified place or not applicable: Secondary | ICD-10-CM | POA: Insufficient documentation

## 2018-10-27 DIAGNOSIS — S82402A Unspecified fracture of shaft of left fibula, initial encounter for closed fracture: Secondary | ICD-10-CM | POA: Diagnosis not present

## 2018-10-27 DIAGNOSIS — S82202A Unspecified fracture of shaft of left tibia, initial encounter for closed fracture: Secondary | ICD-10-CM | POA: Diagnosis not present

## 2018-10-27 DIAGNOSIS — R0902 Hypoxemia: Secondary | ICD-10-CM | POA: Diagnosis not present

## 2018-10-27 DIAGNOSIS — R52 Pain, unspecified: Secondary | ICD-10-CM

## 2018-10-27 DIAGNOSIS — Z87891 Personal history of nicotine dependence: Secondary | ICD-10-CM | POA: Insufficient documentation

## 2018-10-27 DIAGNOSIS — I10 Essential (primary) hypertension: Secondary | ICD-10-CM | POA: Insufficient documentation

## 2018-10-27 DIAGNOSIS — Z79899 Other long term (current) drug therapy: Secondary | ICD-10-CM | POA: Insufficient documentation

## 2018-10-27 DIAGNOSIS — Y999 Unspecified external cause status: Secondary | ICD-10-CM | POA: Insufficient documentation

## 2018-10-27 DIAGNOSIS — Z7901 Long term (current) use of anticoagulants: Secondary | ICD-10-CM | POA: Diagnosis not present

## 2018-10-27 DIAGNOSIS — W01190A Fall on same level from slipping, tripping and stumbling with subsequent striking against furniture, initial encounter: Secondary | ICD-10-CM | POA: Insufficient documentation

## 2018-10-27 DIAGNOSIS — S82832A Other fracture of upper and lower end of left fibula, initial encounter for closed fracture: Secondary | ICD-10-CM | POA: Diagnosis not present

## 2018-10-27 DIAGNOSIS — Z8673 Personal history of transient ischemic attack (TIA), and cerebral infarction without residual deficits: Secondary | ICD-10-CM | POA: Diagnosis not present

## 2018-10-27 DIAGNOSIS — S8992XA Unspecified injury of left lower leg, initial encounter: Secondary | ICD-10-CM | POA: Diagnosis present

## 2018-10-27 DIAGNOSIS — S80919A Unspecified superficial injury of unspecified knee, initial encounter: Secondary | ICD-10-CM | POA: Diagnosis not present

## 2018-10-27 DIAGNOSIS — S82444A Nondisplaced spiral fracture of shaft of right fibula, initial encounter for closed fracture: Secondary | ICD-10-CM | POA: Diagnosis not present

## 2018-10-27 LAB — URINALYSIS, COMPLETE (UACMP) WITH MICROSCOPIC
BACTERIA UA: NONE SEEN
Bilirubin Urine: NEGATIVE
Glucose, UA: NEGATIVE mg/dL
HGB URINE DIPSTICK: NEGATIVE
Ketones, ur: NEGATIVE mg/dL
Nitrite: NEGATIVE
Protein, ur: NEGATIVE mg/dL
Specific Gravity, Urine: 1.008 (ref 1.005–1.030)
pH: 6 (ref 5.0–8.0)

## 2018-10-27 LAB — CBC
HCT: 45.8 % (ref 36.0–46.0)
HEMOGLOBIN: 15 g/dL (ref 12.0–15.0)
MCH: 32.5 pg (ref 26.0–34.0)
MCHC: 32.8 g/dL (ref 30.0–36.0)
MCV: 99.3 fL (ref 80.0–100.0)
Platelets: 355 10*3/uL (ref 150–400)
RBC: 4.61 MIL/uL (ref 3.87–5.11)
RDW: 12.9 % (ref 11.5–15.5)
WBC: 8.2 10*3/uL (ref 4.0–10.5)
nRBC: 0 % (ref 0.0–0.2)

## 2018-10-27 LAB — BASIC METABOLIC PANEL
ANION GAP: 11 (ref 5–15)
BUN: 12 mg/dL (ref 6–20)
CHLORIDE: 101 mmol/L (ref 98–111)
CO2: 29 mmol/L (ref 22–32)
Calcium: 9.7 mg/dL (ref 8.9–10.3)
Creatinine, Ser: 0.63 mg/dL (ref 0.44–1.00)
GFR calc Af Amer: >60 mL/min (ref >60)
GFR calc non Af Amer: >60 mL/min (ref >60)
Glucose, Bld: 92 mg/dL (ref 70–99)
Potassium: 3.7 mmol/L (ref 3.5–5.1)
Sodium: 141 mmol/L (ref 135–145)

## 2018-10-27 LAB — PROTIME-INR
INR: 2.1 — ABNORMAL HIGH (ref 0.8–1.2)
Prothrombin Time: 23 seconds — ABNORMAL HIGH (ref 11.4–15.2)

## 2018-10-27 MED ORDER — FENTANYL CITRATE (PF) 100 MCG/2ML IJ SOLN
50.0000 ug | Freq: Once | INTRAMUSCULAR | Status: AC
  Administered 2018-10-27: 50 ug via INTRAVENOUS
  Filled 2018-10-27: qty 2

## 2018-10-27 MED ORDER — OXYCODONE HCL 5 MG PO TABS
5.0000 mg | ORAL_TABLET | Freq: Once | ORAL | Status: AC
  Administered 2018-10-27: 5 mg via ORAL
  Filled 2018-10-27: qty 1

## 2018-10-27 MED ORDER — ACETAMINOPHEN 500 MG PO TABS
1000.0000 mg | ORAL_TABLET | Freq: Once | ORAL | Status: AC
  Administered 2018-10-27: 1000 mg via ORAL
  Filled 2018-10-27: qty 2

## 2018-10-27 MED ORDER — ONDANSETRON HCL 4 MG/2ML IJ SOLN
4.0000 mg | Freq: Once | INTRAMUSCULAR | Status: AC
  Administered 2018-10-27: 4 mg via INTRAVENOUS
  Filled 2018-10-27: qty 2

## 2018-10-27 MED ORDER — OXYCODONE HCL 5 MG PO TABS: 5.0000 mg | ORAL_TABLET | Freq: Three times a day (TID) | ORAL | 0 refills | Status: DC | PRN

## 2018-10-27 NOTE — ED Triage Notes (Signed)
Pt via ems from home after falling while cleaning. She doesn't know why she fell or whether or not she lost consciousness. Pt takes warfarin, hit the back of her head. Her left ankle is splinted (by ems) upon arrival. Pt alert & oriented with nad noted.

## 2018-10-27 NOTE — ED Notes (Signed)
Pt discharged home after verbalizing understanding of discharge instructions; nad noted. 

## 2018-10-27 NOTE — ED Notes (Signed)
Pt assisted to toilet 

## 2018-10-27 NOTE — ED Provider Notes (Addendum)
[facility] Emergency Department Provider Note  ____________________________________________  Time seen: Approximately 1:29 PM  I have reviewed the triage vital signs and the nursing notes.   HISTORY  Chief Complaint Foot Pain and Fall   HPI Tina Sharp is a 53 y.o. female with a history of CVA on Coumadin who presents for evaluation of mechanical fall.  Patient reports that she was cleaning her house when she slipped and fell.  She hit her head onto a wall clock with no LOC.  She is complaining of severe pain located in her left lower extremity which has been constant since the fall.  She is noted significant swelling.  She has been unable to bear weight.  She denies neck pain or back pain, no hip pain, no chest pain or abdominal pain.  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 ER/ PR+; Her 2 neu not overexpressed, microscopic positive margin (skin). Oncotype DX, low risk for recurrence.   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   Rotator cuff tendinitis, left 06/15/2016   Neuropathy 01/03/2016   Ataxia 01/03/2016   Myalgia 09/08/2015   Depression 09/06/2015   Cerebrovascular accident (CVA) due to thrombosis of posterior cerebral artery ([place]) 08/18/2015   Hyperhomocysteinemia ([place]) 07/12/2015   Stroke with cerebral ischemia ([place]) 07/12/2015   HLD  (hyperlipidemia) 07/12/2015   CVA (cerebral infarction) 06/21/2015   TIA (transient ischemic attack) 06/21/2015   Macrocytic anemia 06/20/2015   Headache 06/14/2015   Cigarette smoker 06/14/2015   Stroke (cerebrum) ([place]) cryptogenic R brain infarcts s/p IV tPA  06/11/2015   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, estrogen receptor positive ([place]) 04/23/2014    Past Surgical History:  Procedure Laterality Date   ABDOMINAL HYSTERECTOMY  2000   left both ovaries in   BREAST SURGERY Left 04/08/14   mastectomy   COLONOSCOPY  06/30/2008   Lucilla Lame, MD; chronic diarrhea. Negative ileal and colon biopsies.    EP IMPLANTABLE DEVICE N/A 06/14/2015   Procedure: Loop Recorder Insertion;  Surgeon: Evans Lance, MD;  Location: [place] [facility];  Service: Cardiovascular;  Laterality: N/A;   HIP ARTHROPLASTY Right 08/01/2016   Procedure: ARTHROPLASTY BIPOLAR HIP (HEMIARTHROPLASTY);  Surgeon: Dereck Leep, MD;  Location: [facility];  Service: Orthopedics;  Laterality: Right;   KYPHOPLASTY N/A 09/16/2018   Procedure: KYPHOPLASTY T12;  Surgeon: Hessie Knows, MD;  Location: [facility];  Service: Orthopedics;  Laterality: N/A;   LAPAROSCOPIC HYSTERECTOMY  2010   [facility]   MASTECTOMY Left 2016   REDUCTION MAMMAPLASTY Right 2016   Lift   reduction mammoplasty Right 04/08/14   Dr Tula Nakayama   TEE WITHOUT CARDIOVERSION N/A 06/14/2015   Procedure: TRANSESOPHAGEAL ECHOCARDIOGRAM (TEE);  Surgeon: Josue Hector, MD;  Location: [place];  Service: Cardiovascular;  Laterality: N/A;   TONSILLECTOMY AND ADENOIDECTOMY     pt  was 53 years old    Prior to Admission medications   Medication Sig Start Date End Date Taking? Authorizing Provider  anastrozole (ARIMIDEX) 1 MG tablet Take 1 tablet (1 mg total) by mouth every evening. 06/15/15  Yes Cammie Sickle, MD  atorvastatin (LIPITOR) 40 MG tablet Take 1 tablet (40 mg total) by mouth daily at 6 PM. 11/09/15  Yes  Gladstone Lighter, MD  baclofen (LIORESAL) 10 MG tablet Take 10 mg by mouth 3 (three) times daily.   Yes [provider]  clonazePAM (KLONOPIN) 0.5 MG tablet Take 1 tablet (0.5 mg total) by mouth 2 (two) times daily as needed for anxiety. 09/17/18  Yes Lule, Joana, PA  cyanocobalamin 1000 MCG tablet Take 1 tablet (1,000 mcg total) by mouth daily. 11/09/15  Yes Gladstone Lighter, MD  DULoxetine (CYMBALTA) 60 MG capsule Take 60 mg by mouth daily. 07/21/16  Yes [provider]  gabapentin (NEURONTIN) 600 MG tablet Take 600 mg by mouth 4 (four) times daily. 07/22/16  Yes [provider]  HYDROcodone-acetaminophen (NORCO/VICODIN) 5-325 MG tablet Take 1 tablet by mouth every 12 (twelve) hours as needed for severe pain. 09/17/18  Yes Max Sane, MD  ibuprofen (ADVIL,MOTRIN) 400 MG tablet Take 1 tablet (400 mg total) by mouth every 6 (six) hours as needed. 09/17/18  Yes Max Sane, MD  omeprazole (PRILOSEC) 20 MG capsule Take 20 mg by mouth as needed (heart burn).    Yes [provider]  potassium chloride (K-DUR) 10 MEQ tablet Take 10 mEq by mouth daily. 08/20/18  Yes [provider]  prochlorperazine (COMPAZINE) 5 MG tablet Take 5 mg by mouth every 6 (six) hours as needed for nausea or vomiting.   Yes [provider]  torsemide (DEMADEX) 20 MG tablet Take 20 mg by mouth daily.  06/17/17 10/27/18 Yes [provider]  warfarin (COUMADIN) 5 MG tablet Take 1 tablet (5 mg total) by mouth one time only at 6 PM for 1 dose. 09/17/18 10/27/18 Yes Max Sane, MD  oxyCODONE (ROXICODONE) 5 MG immediate release tablet Take 1 tablet (5 mg total) by mouth every 8 (eight) hours as needed. 10/27/18 10/27/19  Rudene Re, MD    Allergies Patient has no known allergies.  Family History  Problem Relation Age of Onset   Breast cancer Mother 43   Ovarian cancer Other    Stroke Paternal Grandmother    Breast cancer Maternal Grandmother 25   Cancer Maternal  Grandfather 70       prostate   Prostate cancer Maternal Grandfather     Social History Social History   Tobacco Use   Smoking status: Former Smoker    Packs/day: 0.25    Types: Cigarettes    Last attempt to quit: 10/12/2015    Years since quitting: 3.0   Smokeless tobacco: Never Used   Tobacco comment: E cig  Substance Use Topics   Alcohol use: Not Currently    Alcohol/week: 3.0 standard drinks    Types: 1 Glasses of wine, 1 Cans of beer, 1 Shots of liquor per week    Comment: social   Drug use: No    Review of Systems Constitutional: Negative for fever. Eyes: Negative for visual changes. ENT: Negative for facial injury or neck injury Cardiovascular: Negative for chest injury. Respiratory: Negative for shortness of breath. Negative for chest wall injury. Gastrointestinal: Negative for abdominal pain or injury. Genitourinary: Negative for dysuria. Musculoskeletal: Negative for back injury, +left leg pain. Skin: Negative for laceration/abrasions. Neurological:  Negative for head injury.   ____________________________________________   PHYSICAL EXAM:  VITAL SIGNS: ED Triage Vitals  Enc Vitals Group     BP 10/27/18 1019 121/89     Pulse Rate 10/27/18 1019 82     Resp 10/27/18 1019 18     Temp 10/27/18 1019 97.8 F (36.6 C)     Temp Source 10/27/18 1019 Oral     SpO2 10/27/18 1019 94 %     Weight 10/27/18 1020 135 lb (61.2 kg)     Height 10/27/18 1020 5\' 2"  (1.575 m)     Head Circumference --      Peak Flow --      Pain Score 10/27/18 1019 9     Pain Loc --      Pain Edu? --      Excl. in [place]? --    Full spinal precautions maintained throughout the trauma exam. Constitutional: Alert and oriented. No acute distress. Does not appear intoxicated. HEENT Head: Normocephalic and atraumatic. Face: No facial bony tenderness. Stable midface Ears: No hemotympanum bilaterally. No Battle sign Eyes: No eye injury. PERRL. No raccoon eyes Nose: Nontender. No  epistaxis. No rhinorrhea Mouth/Throat: Mucous membranes are moist. No oropharyngeal blood. No dental injury. Airway patent without stridor. Normal voice. Neck: no C-collar in place. No midline c-spine tenderness.  Cardiovascular: Normal rate, regular rhythm. Normal and symmetric distal pulses are present in all extremities. Pulmonary/Chest: Chest wall is stable and nontender to palpation/compression. Normal respiratory effort. Breath sounds are normal. No crepitus.  Abdominal: Soft, nontender, non distended. Musculoskeletal: Swelling and tenderness to palpation in the mid shaft of the left lower leg and also in the lateral malleolus. nontender with normal full range of motion in all extremities. No deformities. No thoracic or lumbar midline spinal tenderness. Pelvis is stable. Skin: Skin is warm, dry and intact. No abrasions or contutions. Psychiatric: Speech and behavior are appropriate. Neurological: Normal speech and language. Moves all extremities to command. No gross focal neurologic deficits are appreciated.  Glascow Coma Score: 4 - Opens eyes on own 6 - Follows simple motor commands 5 - Alert and oriented GCS: 15   ____________________________________________   LABS (all labs ordered are listed, but only abnormal results are displayed)  Labs Reviewed  PROTIME-INR - Abnormal; Notable for the following components:      Result Value   Prothrombin Time 23.0 (*)    INR 2.1 (*)    All other components within normal limits  BASIC METABOLIC PANEL  CBC  URINALYSIS, COMPLETE (UACMP) WITH MICROSCOPIC  CBG MONITORING, ED   ____________________________________________  EKG  ED ECG REPORT I, Rudene Re, the attending physician, personally viewed and interpreted this ECG.  NSR, rate 79, normal intervals, RAD, no STE or depression  ____________________________________________  RADIOLOGY  I have personally reviewed the images performed during this visit and I agree with the  Radiologist's read.   Interpretation by Radiologist:  Dg Ankle Complete Left  Result Date: 10/27/2018 CLINICAL DATA:  Fall. EXAM: LEFT ANKLE COMPLETE - 3+ VIEW COMPARISON:  None. FINDINGS: Nondisplaced spiral fracture of the distal tibial diaphysis. Nondisplaced spiral fracture through the distal fibular metaphysis. Nondisplaced transverse fracture through the medial malleolus. The ankle mortise is symmetric. The talar dome is intact. Joint spaces are preserved. Bone mineralization is normal. Diffuse soft tissue swelling of the distal lower leg. IMPRESSION: 1. Nondisplaced spiral fractures of the distal tibia and fibula. 2. Nondisplaced transverse fracture of the medial malleolus. Electronically Signed   By: Gwyndolyn Saxon  Marzella Schlein M.D.   On: 10/27/2018 10:51   Ct Head Wo Contrast  Result Date: 10/27/2018 CLINICAL DATA:  [place] today, striking the occipital area. On anticoagulation. EXAM: CT HEAD WITHOUT CONTRAST TECHNIQUE: Contiguous axial images were obtained from the base of the skull through the vertex without intravenous contrast. COMPARISON:  Head CT and MRI 09/12/2018 FINDINGS: Brain: There is no evidence of acute infarct, intracranial hemorrhage, mass, midline shift, or extra-axial fluid collection. Chronic infarcts are again noted in the right frontal, right parietal, and right occipital lobes and right insula. Mild-to-moderate cerebral atrophy is unchanged. Vascular: Mild calcified atherosclerosis at the skull base. No hyperdense vessel. Skull: No fracture or focal osseous lesion. Sinuses/Orbits: Persistent posterior right ethmoid air cell opacification. Clear mastoid air cells. Unremarkable orbits. Other: Resolved left parietal scalp hematoma. IMPRESSION: 1. No evidence of acute intracranial abnormality. 2. Chronic infarcts as above. Electronically Signed   By: Logan Bores M.D.   On: 10/27/2018 12:06     ____________________________________________   PROCEDURES  Procedure(s)  performed:yes .Splint Application Date/Time: 1/44/8185 1:33 PM Performed by: Rudene Re, MD Authorized by: Rudene Re, MD   Consent:    Consent obtained:  Verbal   Consent given by:  Patient   Risks discussed:  Discoloration, numbness, pain and swelling   Alternatives discussed:  Alternative treatment Pre-procedure details:    Sensation:  Normal   Skin color:  Normal Procedure details:    Laterality:  Left   Location:  Leg   Leg:  L lower leg   Cast type:  Short leg   Splint type:  Ankle stirrup   Supplies:  Ortho-Glass Post-procedure details:    Pain:  Improved   Sensation:  Normal   Skin color:  Normal   Patient tolerance of procedure:  Tolerated well, no immediate complications   Critical Care performed:  None ____________________________________________   INITIAL IMPRESSION / ASSESSMENT AND PLAN / ED COURSE  53 y.o. female with a history of CVA on Coumadin who presents for evaluation of mechanical fall.  Patient is only injury is in her left lower extremity.  However since patient is on Coumadin she was sent for a head CT which is unremarkable.  X-ray showing a left tibial shaft spiral fracture and lateral and medial malleolus fracture.  No dislocation.  Intact neurovascular exam.  Discussed with Dr. Marry Guan who recommended a posterior leg and stirrup splint.  That was done with significant improvement of the pain.  Patient does have a walker with a seat which is how her husband usually moves around the house.  Recommended nonweightbearing and follow-up with orthopedics.  Pain is well controlled with oral pain medication.  Discussed signs and symptoms of compartment syndrome recommended return to the emergency room if these develop.      As part of my medical decision making, I reviewed the following data within the [place] notes reviewed and incorporated, Old chart reviewed, Radiograph reviewed , A consult was requested and  obtained from this/these consultant(s) Orthopedics, Notes from prior ED visits and [place] Controlled Substance Database    Pertinent labs & imaging results that were available during my care of the patient were reviewed by me and considered in my medical decision making (see chart for details).    ____________________________________________   FINAL CLINICAL IMPRESSION(S) / ED DIAGNOSES  Final diagnoses:  Closed nondisplaced spiral fracture of shaft of left tibia, initial encounter  Closed fracture of distal end of left fibula, unspecified fracture morphology, initial  encounter      NEW MEDICATIONS STARTED DURING THIS VISIT:  ED Discharge Orders         Ordered    oxyCODONE (ROXICODONE) 5 MG immediate release tablet  Every 8 hours PRN     10/27/18 1328           Note:  This document was prepared using Dragon voice recognition software and may include unintentional dictation errors.    Alfred Levins, [place], MD 10/27/18 [place], [place], MD 10/27/18 [phone]

## 2018-10-27 NOTE — Discharge Instructions (Signed)
Pain control: Take tylenol 1000mg  every 8 hours. Take 5mg  of oxycodone every 6 hours for breakthrough pain. If you need the oxycodone make sure to take one senokot as well to prevent constipation.  Do not drink alcohol, drive or participate in any other potentially dangerous activities while taking this medication as it may make you sleepy. Do not take this medication with any other sedating medications, either prescription or over-the-counter.  DO NOT BEAR WEIGHT ON THE LEG AT ALL. ELEVATE AS MUCH AS POSSIBLE, APPLY ICE. FOLLOW UP WITH ORTHOPEDICS IN 2 DAYS. RETURN FOR WORSENING PAIN, PINS AND NEEDLES, DISCOLORATION OF THE TOES.

## 2018-10-28 DIAGNOSIS — Z5181 Encounter for therapeutic drug level monitoring: Secondary | ICD-10-CM | POA: Diagnosis not present

## 2018-10-28 DIAGNOSIS — W01198D Fall on same level from slipping, tripping and stumbling with subsequent striking against other object, subsequent encounter: Secondary | ICD-10-CM | POA: Diagnosis not present

## 2018-10-28 DIAGNOSIS — S8255XD Nondisplaced fracture of medial malleolus of left tibia, subsequent encounter for closed fracture with routine healing: Secondary | ICD-10-CM | POA: Diagnosis not present

## 2018-10-28 DIAGNOSIS — S22080D Wedge compression fracture of T11-T12 vertebra, subsequent encounter for fracture with routine healing: Secondary | ICD-10-CM | POA: Diagnosis not present

## 2018-10-28 DIAGNOSIS — S82245D Nondisplaced spiral fracture of shaft of left tibia, subsequent encounter for closed fracture with routine healing: Secondary | ICD-10-CM | POA: Diagnosis not present

## 2018-10-28 DIAGNOSIS — Z8673 Personal history of transient ischemic attack (TIA), and cerebral infarction without residual deficits: Secondary | ICD-10-CM | POA: Diagnosis not present

## 2018-10-28 DIAGNOSIS — Z7901 Long term (current) use of anticoagulants: Secondary | ICD-10-CM | POA: Diagnosis not present

## 2018-10-28 DIAGNOSIS — Z87891 Personal history of nicotine dependence: Secondary | ICD-10-CM | POA: Diagnosis not present

## 2018-10-29 ENCOUNTER — Ambulatory Visit (INDEPENDENT_AMBULATORY_CARE_PROVIDER_SITE_OTHER): Payer: Medicare HMO | Admitting: Vascular Surgery

## 2018-10-29 DIAGNOSIS — S82202A Unspecified fracture of shaft of left tibia, initial encounter for closed fracture: Secondary | ICD-10-CM | POA: Diagnosis not present

## 2018-10-29 DIAGNOSIS — S82245D Nondisplaced spiral fracture of shaft of left tibia, subsequent encounter for closed fracture with routine healing: Secondary | ICD-10-CM | POA: Diagnosis not present

## 2018-10-29 DIAGNOSIS — S22080D Wedge compression fracture of T11-T12 vertebra, subsequent encounter for fracture with routine healing: Secondary | ICD-10-CM | POA: Diagnosis not present

## 2018-10-29 DIAGNOSIS — S82402A Unspecified fracture of shaft of left fibula, initial encounter for closed fracture: Secondary | ICD-10-CM | POA: Diagnosis not present

## 2018-10-29 DIAGNOSIS — S8255XD Nondisplaced fracture of medial malleolus of left tibia, subsequent encounter for closed fracture with routine healing: Secondary | ICD-10-CM | POA: Diagnosis not present

## 2018-10-29 DIAGNOSIS — Z7901 Long term (current) use of anticoagulants: Secondary | ICD-10-CM | POA: Diagnosis not present

## 2018-10-29 DIAGNOSIS — Z87891 Personal history of nicotine dependence: Secondary | ICD-10-CM | POA: Diagnosis not present

## 2018-10-29 DIAGNOSIS — W01198D Fall on same level from slipping, tripping and stumbling with subsequent striking against other object, subsequent encounter: Secondary | ICD-10-CM | POA: Diagnosis not present

## 2018-10-29 DIAGNOSIS — Z5181 Encounter for therapeutic drug level monitoring: Secondary | ICD-10-CM | POA: Diagnosis not present

## 2018-10-29 DIAGNOSIS — Z8673 Personal history of transient ischemic attack (TIA), and cerebral infarction without residual deficits: Secondary | ICD-10-CM | POA: Diagnosis not present

## 2018-10-30 DIAGNOSIS — Z8673 Personal history of transient ischemic attack (TIA), and cerebral infarction without residual deficits: Secondary | ICD-10-CM | POA: Diagnosis not present

## 2018-10-30 DIAGNOSIS — W01198D Fall on same level from slipping, tripping and stumbling with subsequent striking against other object, subsequent encounter: Secondary | ICD-10-CM | POA: Diagnosis not present

## 2018-10-30 DIAGNOSIS — Z87891 Personal history of nicotine dependence: Secondary | ICD-10-CM | POA: Diagnosis not present

## 2018-10-30 DIAGNOSIS — S22080D Wedge compression fracture of T11-T12 vertebra, subsequent encounter for fracture with routine healing: Secondary | ICD-10-CM | POA: Diagnosis not present

## 2018-10-30 DIAGNOSIS — Z5181 Encounter for therapeutic drug level monitoring: Secondary | ICD-10-CM | POA: Diagnosis not present

## 2018-10-30 DIAGNOSIS — S82245D Nondisplaced spiral fracture of shaft of left tibia, subsequent encounter for closed fracture with routine healing: Secondary | ICD-10-CM | POA: Diagnosis not present

## 2018-10-30 DIAGNOSIS — S8255XD Nondisplaced fracture of medial malleolus of left tibia, subsequent encounter for closed fracture with routine healing: Secondary | ICD-10-CM | POA: Diagnosis not present

## 2018-10-30 DIAGNOSIS — Z7901 Long term (current) use of anticoagulants: Secondary | ICD-10-CM | POA: Diagnosis not present

## 2018-10-31 DIAGNOSIS — Z8673 Personal history of transient ischemic attack (TIA), and cerebral infarction without residual deficits: Secondary | ICD-10-CM | POA: Diagnosis not present

## 2018-10-31 DIAGNOSIS — W01198D Fall on same level from slipping, tripping and stumbling with subsequent striking against other object, subsequent encounter: Secondary | ICD-10-CM | POA: Diagnosis not present

## 2018-10-31 DIAGNOSIS — S82245D Nondisplaced spiral fracture of shaft of left tibia, subsequent encounter for closed fracture with routine healing: Secondary | ICD-10-CM | POA: Diagnosis not present

## 2018-10-31 DIAGNOSIS — Z5181 Encounter for therapeutic drug level monitoring: Secondary | ICD-10-CM | POA: Diagnosis not present

## 2018-10-31 DIAGNOSIS — Z7901 Long term (current) use of anticoagulants: Secondary | ICD-10-CM | POA: Diagnosis not present

## 2018-10-31 DIAGNOSIS — S8255XD Nondisplaced fracture of medial malleolus of left tibia, subsequent encounter for closed fracture with routine healing: Secondary | ICD-10-CM | POA: Diagnosis not present

## 2018-10-31 DIAGNOSIS — S22080D Wedge compression fracture of T11-T12 vertebra, subsequent encounter for fracture with routine healing: Secondary | ICD-10-CM | POA: Diagnosis not present

## 2018-10-31 DIAGNOSIS — Z87891 Personal history of nicotine dependence: Secondary | ICD-10-CM | POA: Diagnosis not present

## 2018-11-03 DIAGNOSIS — S8255XD Nondisplaced fracture of medial malleolus of left tibia, subsequent encounter for closed fracture with routine healing: Secondary | ICD-10-CM | POA: Diagnosis not present

## 2018-11-03 DIAGNOSIS — W01198D Fall on same level from slipping, tripping and stumbling with subsequent striking against other object, subsequent encounter: Secondary | ICD-10-CM | POA: Diagnosis not present

## 2018-11-03 DIAGNOSIS — S82245D Nondisplaced spiral fracture of shaft of left tibia, subsequent encounter for closed fracture with routine healing: Secondary | ICD-10-CM | POA: Diagnosis not present

## 2018-11-03 DIAGNOSIS — Z8673 Personal history of transient ischemic attack (TIA), and cerebral infarction without residual deficits: Secondary | ICD-10-CM | POA: Diagnosis not present

## 2018-11-03 DIAGNOSIS — Z87891 Personal history of nicotine dependence: Secondary | ICD-10-CM | POA: Diagnosis not present

## 2018-11-03 DIAGNOSIS — S22080D Wedge compression fracture of T11-T12 vertebra, subsequent encounter for fracture with routine healing: Secondary | ICD-10-CM | POA: Diagnosis not present

## 2018-11-03 DIAGNOSIS — Z7901 Long term (current) use of anticoagulants: Secondary | ICD-10-CM | POA: Diagnosis not present

## 2018-11-03 DIAGNOSIS — Z5181 Encounter for therapeutic drug level monitoring: Secondary | ICD-10-CM | POA: Diagnosis not present

## 2018-11-04 DIAGNOSIS — Z5181 Encounter for therapeutic drug level monitoring: Secondary | ICD-10-CM | POA: Diagnosis not present

## 2018-11-04 DIAGNOSIS — Z8673 Personal history of transient ischemic attack (TIA), and cerebral infarction without residual deficits: Secondary | ICD-10-CM | POA: Diagnosis not present

## 2018-11-04 DIAGNOSIS — S22080D Wedge compression fracture of T11-T12 vertebra, subsequent encounter for fracture with routine healing: Secondary | ICD-10-CM | POA: Diagnosis not present

## 2018-11-04 DIAGNOSIS — Z87891 Personal history of nicotine dependence: Secondary | ICD-10-CM | POA: Diagnosis not present

## 2018-11-04 DIAGNOSIS — S8255XD Nondisplaced fracture of medial malleolus of left tibia, subsequent encounter for closed fracture with routine healing: Secondary | ICD-10-CM | POA: Diagnosis not present

## 2018-11-04 DIAGNOSIS — Z7901 Long term (current) use of anticoagulants: Secondary | ICD-10-CM | POA: Diagnosis not present

## 2018-11-04 DIAGNOSIS — W01198D Fall on same level from slipping, tripping and stumbling with subsequent striking against other object, subsequent encounter: Secondary | ICD-10-CM | POA: Diagnosis not present

## 2018-11-04 DIAGNOSIS — S82245D Nondisplaced spiral fracture of shaft of left tibia, subsequent encounter for closed fracture with routine healing: Secondary | ICD-10-CM | POA: Diagnosis not present

## 2018-11-05 DIAGNOSIS — Z5181 Encounter for therapeutic drug level monitoring: Secondary | ICD-10-CM | POA: Diagnosis not present

## 2018-11-05 DIAGNOSIS — S22080D Wedge compression fracture of T11-T12 vertebra, subsequent encounter for fracture with routine healing: Secondary | ICD-10-CM | POA: Diagnosis not present

## 2018-11-05 DIAGNOSIS — S82245D Nondisplaced spiral fracture of shaft of left tibia, subsequent encounter for closed fracture with routine healing: Secondary | ICD-10-CM | POA: Diagnosis not present

## 2018-11-05 DIAGNOSIS — Z7901 Long term (current) use of anticoagulants: Secondary | ICD-10-CM | POA: Diagnosis not present

## 2018-11-05 DIAGNOSIS — Z8673 Personal history of transient ischemic attack (TIA), and cerebral infarction without residual deficits: Secondary | ICD-10-CM | POA: Diagnosis not present

## 2018-11-05 DIAGNOSIS — S8255XD Nondisplaced fracture of medial malleolus of left tibia, subsequent encounter for closed fracture with routine healing: Secondary | ICD-10-CM | POA: Diagnosis not present

## 2018-11-05 DIAGNOSIS — W01198D Fall on same level from slipping, tripping and stumbling with subsequent striking against other object, subsequent encounter: Secondary | ICD-10-CM | POA: Diagnosis not present

## 2018-11-05 DIAGNOSIS — S82402D Unspecified fracture of shaft of left fibula, subsequent encounter for closed fracture with routine healing: Secondary | ICD-10-CM | POA: Diagnosis not present

## 2018-11-05 DIAGNOSIS — M25572 Pain in left ankle and joints of left foot: Secondary | ICD-10-CM | POA: Diagnosis not present

## 2018-11-05 DIAGNOSIS — Z87891 Personal history of nicotine dependence: Secondary | ICD-10-CM | POA: Diagnosis not present

## 2018-11-05 DIAGNOSIS — S82202D Unspecified fracture of shaft of left tibia, subsequent encounter for closed fracture with routine healing: Secondary | ICD-10-CM | POA: Diagnosis not present

## 2018-11-06 DIAGNOSIS — Z7901 Long term (current) use of anticoagulants: Secondary | ICD-10-CM | POA: Diagnosis not present

## 2018-11-06 DIAGNOSIS — W01198D Fall on same level from slipping, tripping and stumbling with subsequent striking against other object, subsequent encounter: Secondary | ICD-10-CM | POA: Diagnosis not present

## 2018-11-06 DIAGNOSIS — S22080D Wedge compression fracture of T11-T12 vertebra, subsequent encounter for fracture with routine healing: Secondary | ICD-10-CM | POA: Diagnosis not present

## 2018-11-06 DIAGNOSIS — S82245D Nondisplaced spiral fracture of shaft of left tibia, subsequent encounter for closed fracture with routine healing: Secondary | ICD-10-CM | POA: Diagnosis not present

## 2018-11-06 DIAGNOSIS — S8255XD Nondisplaced fracture of medial malleolus of left tibia, subsequent encounter for closed fracture with routine healing: Secondary | ICD-10-CM | POA: Diagnosis not present

## 2018-11-06 DIAGNOSIS — Z5181 Encounter for therapeutic drug level monitoring: Secondary | ICD-10-CM | POA: Diagnosis not present

## 2018-11-06 DIAGNOSIS — Z87891 Personal history of nicotine dependence: Secondary | ICD-10-CM | POA: Diagnosis not present

## 2018-11-06 DIAGNOSIS — Z8673 Personal history of transient ischemic attack (TIA), and cerebral infarction without residual deficits: Secondary | ICD-10-CM | POA: Diagnosis not present

## 2018-11-07 DIAGNOSIS — Z8673 Personal history of transient ischemic attack (TIA), and cerebral infarction without residual deficits: Secondary | ICD-10-CM | POA: Diagnosis not present

## 2018-11-07 DIAGNOSIS — Z7901 Long term (current) use of anticoagulants: Secondary | ICD-10-CM | POA: Diagnosis not present

## 2018-11-07 DIAGNOSIS — Z87891 Personal history of nicotine dependence: Secondary | ICD-10-CM | POA: Diagnosis not present

## 2018-11-07 DIAGNOSIS — W01198D Fall on same level from slipping, tripping and stumbling with subsequent striking against other object, subsequent encounter: Secondary | ICD-10-CM | POA: Diagnosis not present

## 2018-11-07 DIAGNOSIS — S82245D Nondisplaced spiral fracture of shaft of left tibia, subsequent encounter for closed fracture with routine healing: Secondary | ICD-10-CM | POA: Diagnosis not present

## 2018-11-07 DIAGNOSIS — Z5181 Encounter for therapeutic drug level monitoring: Secondary | ICD-10-CM | POA: Diagnosis not present

## 2018-11-07 DIAGNOSIS — S8255XD Nondisplaced fracture of medial malleolus of left tibia, subsequent encounter for closed fracture with routine healing: Secondary | ICD-10-CM | POA: Diagnosis not present

## 2018-11-07 DIAGNOSIS — S22080D Wedge compression fracture of T11-T12 vertebra, subsequent encounter for fracture with routine healing: Secondary | ICD-10-CM | POA: Diagnosis not present

## 2018-11-10 DIAGNOSIS — Z87891 Personal history of nicotine dependence: Secondary | ICD-10-CM | POA: Diagnosis not present

## 2018-11-10 DIAGNOSIS — W01198D Fall on same level from slipping, tripping and stumbling with subsequent striking against other object, subsequent encounter: Secondary | ICD-10-CM | POA: Diagnosis not present

## 2018-11-10 DIAGNOSIS — S82245D Nondisplaced spiral fracture of shaft of left tibia, subsequent encounter for closed fracture with routine healing: Secondary | ICD-10-CM | POA: Diagnosis not present

## 2018-11-10 DIAGNOSIS — Z7901 Long term (current) use of anticoagulants: Secondary | ICD-10-CM | POA: Diagnosis not present

## 2018-11-10 DIAGNOSIS — Z5181 Encounter for therapeutic drug level monitoring: Secondary | ICD-10-CM | POA: Diagnosis not present

## 2018-11-10 DIAGNOSIS — Z8673 Personal history of transient ischemic attack (TIA), and cerebral infarction without residual deficits: Secondary | ICD-10-CM | POA: Diagnosis not present

## 2018-11-10 DIAGNOSIS — S8255XD Nondisplaced fracture of medial malleolus of left tibia, subsequent encounter for closed fracture with routine healing: Secondary | ICD-10-CM | POA: Diagnosis not present

## 2018-11-10 DIAGNOSIS — S22080D Wedge compression fracture of T11-T12 vertebra, subsequent encounter for fracture with routine healing: Secondary | ICD-10-CM | POA: Diagnosis not present

## 2018-11-11 DIAGNOSIS — Z8673 Personal history of transient ischemic attack (TIA), and cerebral infarction without residual deficits: Secondary | ICD-10-CM | POA: Diagnosis not present

## 2018-11-11 DIAGNOSIS — S8255XD Nondisplaced fracture of medial malleolus of left tibia, subsequent encounter for closed fracture with routine healing: Secondary | ICD-10-CM | POA: Diagnosis not present

## 2018-11-11 DIAGNOSIS — Z87891 Personal history of nicotine dependence: Secondary | ICD-10-CM | POA: Diagnosis not present

## 2018-11-11 DIAGNOSIS — S82245D Nondisplaced spiral fracture of shaft of left tibia, subsequent encounter for closed fracture with routine healing: Secondary | ICD-10-CM | POA: Diagnosis not present

## 2018-11-11 DIAGNOSIS — Z5181 Encounter for therapeutic drug level monitoring: Secondary | ICD-10-CM | POA: Diagnosis not present

## 2018-11-11 DIAGNOSIS — W01198D Fall on same level from slipping, tripping and stumbling with subsequent striking against other object, subsequent encounter: Secondary | ICD-10-CM | POA: Diagnosis not present

## 2018-11-11 DIAGNOSIS — S22080D Wedge compression fracture of T11-T12 vertebra, subsequent encounter for fracture with routine healing: Secondary | ICD-10-CM | POA: Diagnosis not present

## 2018-11-11 DIAGNOSIS — Z7901 Long term (current) use of anticoagulants: Secondary | ICD-10-CM | POA: Diagnosis not present

## 2018-11-12 DIAGNOSIS — S82402D Unspecified fracture of shaft of left fibula, subsequent encounter for closed fracture with routine healing: Secondary | ICD-10-CM | POA: Diagnosis not present

## 2018-11-12 DIAGNOSIS — M25572 Pain in left ankle and joints of left foot: Secondary | ICD-10-CM | POA: Diagnosis not present

## 2018-11-12 DIAGNOSIS — S82202D Unspecified fracture of shaft of left tibia, subsequent encounter for closed fracture with routine healing: Secondary | ICD-10-CM | POA: Diagnosis not present

## 2018-11-13 DIAGNOSIS — S82245D Nondisplaced spiral fracture of shaft of left tibia, subsequent encounter for closed fracture with routine healing: Secondary | ICD-10-CM | POA: Diagnosis not present

## 2018-11-13 DIAGNOSIS — Z7901 Long term (current) use of anticoagulants: Secondary | ICD-10-CM | POA: Diagnosis not present

## 2018-11-13 DIAGNOSIS — S8255XD Nondisplaced fracture of medial malleolus of left tibia, subsequent encounter for closed fracture with routine healing: Secondary | ICD-10-CM | POA: Diagnosis not present

## 2018-11-13 DIAGNOSIS — Z87891 Personal history of nicotine dependence: Secondary | ICD-10-CM | POA: Diagnosis not present

## 2018-11-13 DIAGNOSIS — W01198D Fall on same level from slipping, tripping and stumbling with subsequent striking against other object, subsequent encounter: Secondary | ICD-10-CM | POA: Diagnosis not present

## 2018-11-13 DIAGNOSIS — Z5181 Encounter for therapeutic drug level monitoring: Secondary | ICD-10-CM | POA: Diagnosis not present

## 2018-11-13 DIAGNOSIS — Z8673 Personal history of transient ischemic attack (TIA), and cerebral infarction without residual deficits: Secondary | ICD-10-CM | POA: Diagnosis not present

## 2018-11-13 DIAGNOSIS — S22080D Wedge compression fracture of T11-T12 vertebra, subsequent encounter for fracture with routine healing: Secondary | ICD-10-CM | POA: Diagnosis not present

## 2018-11-14 DIAGNOSIS — Z5181 Encounter for therapeutic drug level monitoring: Secondary | ICD-10-CM | POA: Diagnosis not present

## 2018-11-14 DIAGNOSIS — Z7901 Long term (current) use of anticoagulants: Secondary | ICD-10-CM | POA: Diagnosis not present

## 2018-11-14 DIAGNOSIS — S8255XD Nondisplaced fracture of medial malleolus of left tibia, subsequent encounter for closed fracture with routine healing: Secondary | ICD-10-CM | POA: Diagnosis not present

## 2018-11-14 DIAGNOSIS — W01198D Fall on same level from slipping, tripping and stumbling with subsequent striking against other object, subsequent encounter: Secondary | ICD-10-CM | POA: Diagnosis not present

## 2018-11-14 DIAGNOSIS — S82245D Nondisplaced spiral fracture of shaft of left tibia, subsequent encounter for closed fracture with routine healing: Secondary | ICD-10-CM | POA: Diagnosis not present

## 2018-11-14 DIAGNOSIS — Z87891 Personal history of nicotine dependence: Secondary | ICD-10-CM | POA: Diagnosis not present

## 2018-11-14 DIAGNOSIS — Z8673 Personal history of transient ischemic attack (TIA), and cerebral infarction without residual deficits: Secondary | ICD-10-CM | POA: Diagnosis not present

## 2018-11-14 DIAGNOSIS — S22080D Wedge compression fracture of T11-T12 vertebra, subsequent encounter for fracture with routine healing: Secondary | ICD-10-CM | POA: Diagnosis not present

## 2018-11-17 DIAGNOSIS — W01198D Fall on same level from slipping, tripping and stumbling with subsequent striking against other object, subsequent encounter: Secondary | ICD-10-CM | POA: Diagnosis not present

## 2018-11-17 DIAGNOSIS — Z87891 Personal history of nicotine dependence: Secondary | ICD-10-CM | POA: Diagnosis not present

## 2018-11-17 DIAGNOSIS — S82245D Nondisplaced spiral fracture of shaft of left tibia, subsequent encounter for closed fracture with routine healing: Secondary | ICD-10-CM | POA: Diagnosis not present

## 2018-11-17 DIAGNOSIS — S22080D Wedge compression fracture of T11-T12 vertebra, subsequent encounter for fracture with routine healing: Secondary | ICD-10-CM | POA: Diagnosis not present

## 2018-11-17 DIAGNOSIS — Z5181 Encounter for therapeutic drug level monitoring: Secondary | ICD-10-CM | POA: Diagnosis not present

## 2018-11-17 DIAGNOSIS — Z7901 Long term (current) use of anticoagulants: Secondary | ICD-10-CM | POA: Diagnosis not present

## 2018-11-17 DIAGNOSIS — Z8673 Personal history of transient ischemic attack (TIA), and cerebral infarction without residual deficits: Secondary | ICD-10-CM | POA: Diagnosis not present

## 2018-11-17 DIAGNOSIS — S8255XD Nondisplaced fracture of medial malleolus of left tibia, subsequent encounter for closed fracture with routine healing: Secondary | ICD-10-CM | POA: Diagnosis not present

## 2018-11-18 DIAGNOSIS — S82245D Nondisplaced spiral fracture of shaft of left tibia, subsequent encounter for closed fracture with routine healing: Secondary | ICD-10-CM | POA: Diagnosis not present

## 2018-11-18 DIAGNOSIS — Z87891 Personal history of nicotine dependence: Secondary | ICD-10-CM | POA: Diagnosis not present

## 2018-11-18 DIAGNOSIS — Z7901 Long term (current) use of anticoagulants: Secondary | ICD-10-CM | POA: Diagnosis not present

## 2018-11-18 DIAGNOSIS — Z5181 Encounter for therapeutic drug level monitoring: Secondary | ICD-10-CM | POA: Diagnosis not present

## 2018-11-18 DIAGNOSIS — Z8673 Personal history of transient ischemic attack (TIA), and cerebral infarction without residual deficits: Secondary | ICD-10-CM | POA: Diagnosis not present

## 2018-11-18 DIAGNOSIS — S22080D Wedge compression fracture of T11-T12 vertebra, subsequent encounter for fracture with routine healing: Secondary | ICD-10-CM | POA: Diagnosis not present

## 2018-11-18 DIAGNOSIS — S8255XD Nondisplaced fracture of medial malleolus of left tibia, subsequent encounter for closed fracture with routine healing: Secondary | ICD-10-CM | POA: Diagnosis not present

## 2018-11-18 DIAGNOSIS — W01198D Fall on same level from slipping, tripping and stumbling with subsequent striking against other object, subsequent encounter: Secondary | ICD-10-CM | POA: Diagnosis not present

## 2018-11-19 DIAGNOSIS — Z5181 Encounter for therapeutic drug level monitoring: Secondary | ICD-10-CM | POA: Diagnosis not present

## 2018-11-19 DIAGNOSIS — Z7901 Long term (current) use of anticoagulants: Secondary | ICD-10-CM | POA: Diagnosis not present

## 2018-11-19 DIAGNOSIS — Z87891 Personal history of nicotine dependence: Secondary | ICD-10-CM | POA: Diagnosis not present

## 2018-11-19 DIAGNOSIS — Z8673 Personal history of transient ischemic attack (TIA), and cerebral infarction without residual deficits: Secondary | ICD-10-CM | POA: Diagnosis not present

## 2018-11-19 DIAGNOSIS — S82245D Nondisplaced spiral fracture of shaft of left tibia, subsequent encounter for closed fracture with routine healing: Secondary | ICD-10-CM | POA: Diagnosis not present

## 2018-11-19 DIAGNOSIS — W01198D Fall on same level from slipping, tripping and stumbling with subsequent striking against other object, subsequent encounter: Secondary | ICD-10-CM | POA: Diagnosis not present

## 2018-11-19 DIAGNOSIS — S8255XD Nondisplaced fracture of medial malleolus of left tibia, subsequent encounter for closed fracture with routine healing: Secondary | ICD-10-CM | POA: Diagnosis not present

## 2018-11-19 DIAGNOSIS — S22080D Wedge compression fracture of T11-T12 vertebra, subsequent encounter for fracture with routine healing: Secondary | ICD-10-CM | POA: Diagnosis not present

## 2018-11-20 DIAGNOSIS — Z8673 Personal history of transient ischemic attack (TIA), and cerebral infarction without residual deficits: Secondary | ICD-10-CM | POA: Diagnosis not present

## 2018-11-20 DIAGNOSIS — Z7901 Long term (current) use of anticoagulants: Secondary | ICD-10-CM | POA: Diagnosis not present

## 2018-11-20 DIAGNOSIS — Z5181 Encounter for therapeutic drug level monitoring: Secondary | ICD-10-CM | POA: Diagnosis not present

## 2018-11-20 DIAGNOSIS — W01198D Fall on same level from slipping, tripping and stumbling with subsequent striking against other object, subsequent encounter: Secondary | ICD-10-CM | POA: Diagnosis not present

## 2018-11-20 DIAGNOSIS — S22080D Wedge compression fracture of T11-T12 vertebra, subsequent encounter for fracture with routine healing: Secondary | ICD-10-CM | POA: Diagnosis not present

## 2018-11-20 DIAGNOSIS — S8255XD Nondisplaced fracture of medial malleolus of left tibia, subsequent encounter for closed fracture with routine healing: Secondary | ICD-10-CM | POA: Diagnosis not present

## 2018-11-20 DIAGNOSIS — Z87891 Personal history of nicotine dependence: Secondary | ICD-10-CM | POA: Diagnosis not present

## 2018-11-20 DIAGNOSIS — S82245D Nondisplaced spiral fracture of shaft of left tibia, subsequent encounter for closed fracture with routine healing: Secondary | ICD-10-CM | POA: Diagnosis not present

## 2018-11-24 DIAGNOSIS — Z87891 Personal history of nicotine dependence: Secondary | ICD-10-CM | POA: Diagnosis not present

## 2018-11-24 DIAGNOSIS — S82245D Nondisplaced spiral fracture of shaft of left tibia, subsequent encounter for closed fracture with routine healing: Secondary | ICD-10-CM | POA: Diagnosis not present

## 2018-11-24 DIAGNOSIS — Z7901 Long term (current) use of anticoagulants: Secondary | ICD-10-CM | POA: Diagnosis not present

## 2018-11-24 DIAGNOSIS — W01198D Fall on same level from slipping, tripping and stumbling with subsequent striking against other object, subsequent encounter: Secondary | ICD-10-CM | POA: Diagnosis not present

## 2018-11-24 DIAGNOSIS — S22080D Wedge compression fracture of T11-T12 vertebra, subsequent encounter for fracture with routine healing: Secondary | ICD-10-CM | POA: Diagnosis not present

## 2018-11-24 DIAGNOSIS — Z5181 Encounter for therapeutic drug level monitoring: Secondary | ICD-10-CM | POA: Diagnosis not present

## 2018-11-24 DIAGNOSIS — S8255XD Nondisplaced fracture of medial malleolus of left tibia, subsequent encounter for closed fracture with routine healing: Secondary | ICD-10-CM | POA: Diagnosis not present

## 2018-11-24 DIAGNOSIS — Z8673 Personal history of transient ischemic attack (TIA), and cerebral infarction without residual deficits: Secondary | ICD-10-CM | POA: Diagnosis not present

## 2018-11-25 DIAGNOSIS — Z7901 Long term (current) use of anticoagulants: Secondary | ICD-10-CM | POA: Diagnosis not present

## 2018-11-25 DIAGNOSIS — Z5181 Encounter for therapeutic drug level monitoring: Secondary | ICD-10-CM | POA: Diagnosis not present

## 2018-11-25 DIAGNOSIS — Z8673 Personal history of transient ischemic attack (TIA), and cerebral infarction without residual deficits: Secondary | ICD-10-CM | POA: Diagnosis not present

## 2018-11-25 DIAGNOSIS — S82202D Unspecified fracture of shaft of left tibia, subsequent encounter for closed fracture with routine healing: Secondary | ICD-10-CM | POA: Diagnosis not present

## 2018-11-25 DIAGNOSIS — S82245D Nondisplaced spiral fracture of shaft of left tibia, subsequent encounter for closed fracture with routine healing: Secondary | ICD-10-CM | POA: Diagnosis not present

## 2018-11-25 DIAGNOSIS — M25572 Pain in left ankle and joints of left foot: Secondary | ICD-10-CM | POA: Diagnosis not present

## 2018-11-25 DIAGNOSIS — S82402D Unspecified fracture of shaft of left fibula, subsequent encounter for closed fracture with routine healing: Secondary | ICD-10-CM | POA: Diagnosis not present

## 2018-11-25 DIAGNOSIS — S8255XD Nondisplaced fracture of medial malleolus of left tibia, subsequent encounter for closed fracture with routine healing: Secondary | ICD-10-CM | POA: Diagnosis not present

## 2018-11-25 DIAGNOSIS — S22080D Wedge compression fracture of T11-T12 vertebra, subsequent encounter for fracture with routine healing: Secondary | ICD-10-CM | POA: Diagnosis not present

## 2018-11-25 DIAGNOSIS — W01198D Fall on same level from slipping, tripping and stumbling with subsequent striking against other object, subsequent encounter: Secondary | ICD-10-CM | POA: Diagnosis not present

## 2018-11-25 DIAGNOSIS — Z87891 Personal history of nicotine dependence: Secondary | ICD-10-CM | POA: Diagnosis not present

## 2018-11-26 DIAGNOSIS — Z5181 Encounter for therapeutic drug level monitoring: Secondary | ICD-10-CM | POA: Diagnosis not present

## 2018-11-26 DIAGNOSIS — S8255XD Nondisplaced fracture of medial malleolus of left tibia, subsequent encounter for closed fracture with routine healing: Secondary | ICD-10-CM | POA: Diagnosis not present

## 2018-11-26 DIAGNOSIS — W01198D Fall on same level from slipping, tripping and stumbling with subsequent striking against other object, subsequent encounter: Secondary | ICD-10-CM | POA: Diagnosis not present

## 2018-11-26 DIAGNOSIS — Z8673 Personal history of transient ischemic attack (TIA), and cerebral infarction without residual deficits: Secondary | ICD-10-CM | POA: Diagnosis not present

## 2018-11-26 DIAGNOSIS — S82245D Nondisplaced spiral fracture of shaft of left tibia, subsequent encounter for closed fracture with routine healing: Secondary | ICD-10-CM | POA: Diagnosis not present

## 2018-11-26 DIAGNOSIS — Z87891 Personal history of nicotine dependence: Secondary | ICD-10-CM | POA: Diagnosis not present

## 2018-11-26 DIAGNOSIS — Z7901 Long term (current) use of anticoagulants: Secondary | ICD-10-CM | POA: Diagnosis not present

## 2018-11-26 DIAGNOSIS — S22080D Wedge compression fracture of T11-T12 vertebra, subsequent encounter for fracture with routine healing: Secondary | ICD-10-CM | POA: Diagnosis not present

## 2018-11-28 DIAGNOSIS — W01198D Fall on same level from slipping, tripping and stumbling with subsequent striking against other object, subsequent encounter: Secondary | ICD-10-CM | POA: Diagnosis not present

## 2018-11-28 DIAGNOSIS — Z87891 Personal history of nicotine dependence: Secondary | ICD-10-CM | POA: Diagnosis not present

## 2018-11-28 DIAGNOSIS — Z5181 Encounter for therapeutic drug level monitoring: Secondary | ICD-10-CM | POA: Diagnosis not present

## 2018-11-28 DIAGNOSIS — S8255XD Nondisplaced fracture of medial malleolus of left tibia, subsequent encounter for closed fracture with routine healing: Secondary | ICD-10-CM | POA: Diagnosis not present

## 2018-11-28 DIAGNOSIS — Z8673 Personal history of transient ischemic attack (TIA), and cerebral infarction without residual deficits: Secondary | ICD-10-CM | POA: Diagnosis not present

## 2018-11-28 DIAGNOSIS — S82245D Nondisplaced spiral fracture of shaft of left tibia, subsequent encounter for closed fracture with routine healing: Secondary | ICD-10-CM | POA: Diagnosis not present

## 2018-11-28 DIAGNOSIS — S22080D Wedge compression fracture of T11-T12 vertebra, subsequent encounter for fracture with routine healing: Secondary | ICD-10-CM | POA: Diagnosis not present

## 2018-11-28 DIAGNOSIS — Z7901 Long term (current) use of anticoagulants: Secondary | ICD-10-CM | POA: Diagnosis not present

## 2018-12-02 DIAGNOSIS — Z5181 Encounter for therapeutic drug level monitoring: Secondary | ICD-10-CM | POA: Diagnosis not present

## 2018-12-02 DIAGNOSIS — W01198D Fall on same level from slipping, tripping and stumbling with subsequent striking against other object, subsequent encounter: Secondary | ICD-10-CM | POA: Diagnosis not present

## 2018-12-02 DIAGNOSIS — S82245D Nondisplaced spiral fracture of shaft of left tibia, subsequent encounter for closed fracture with routine healing: Secondary | ICD-10-CM | POA: Diagnosis not present

## 2018-12-02 DIAGNOSIS — Z8673 Personal history of transient ischemic attack (TIA), and cerebral infarction without residual deficits: Secondary | ICD-10-CM | POA: Diagnosis not present

## 2018-12-02 DIAGNOSIS — S8255XD Nondisplaced fracture of medial malleolus of left tibia, subsequent encounter for closed fracture with routine healing: Secondary | ICD-10-CM | POA: Diagnosis not present

## 2018-12-02 DIAGNOSIS — S22080D Wedge compression fracture of T11-T12 vertebra, subsequent encounter for fracture with routine healing: Secondary | ICD-10-CM | POA: Diagnosis not present

## 2018-12-02 DIAGNOSIS — Z7901 Long term (current) use of anticoagulants: Secondary | ICD-10-CM | POA: Diagnosis not present

## 2018-12-02 DIAGNOSIS — Z87891 Personal history of nicotine dependence: Secondary | ICD-10-CM | POA: Diagnosis not present

## 2018-12-03 DIAGNOSIS — Z7901 Long term (current) use of anticoagulants: Secondary | ICD-10-CM | POA: Diagnosis not present

## 2018-12-03 DIAGNOSIS — W01198D Fall on same level from slipping, tripping and stumbling with subsequent striking against other object, subsequent encounter: Secondary | ICD-10-CM | POA: Diagnosis not present

## 2018-12-03 DIAGNOSIS — S82245D Nondisplaced spiral fracture of shaft of left tibia, subsequent encounter for closed fracture with routine healing: Secondary | ICD-10-CM | POA: Diagnosis not present

## 2018-12-03 DIAGNOSIS — S8255XD Nondisplaced fracture of medial malleolus of left tibia, subsequent encounter for closed fracture with routine healing: Secondary | ICD-10-CM | POA: Diagnosis not present

## 2018-12-03 DIAGNOSIS — Z87891 Personal history of nicotine dependence: Secondary | ICD-10-CM | POA: Diagnosis not present

## 2018-12-03 DIAGNOSIS — Z5181 Encounter for therapeutic drug level monitoring: Secondary | ICD-10-CM | POA: Diagnosis not present

## 2018-12-03 DIAGNOSIS — S22080D Wedge compression fracture of T11-T12 vertebra, subsequent encounter for fracture with routine healing: Secondary | ICD-10-CM | POA: Diagnosis not present

## 2018-12-03 DIAGNOSIS — Z8673 Personal history of transient ischemic attack (TIA), and cerebral infarction without residual deficits: Secondary | ICD-10-CM | POA: Diagnosis not present

## 2018-12-04 DIAGNOSIS — Z87891 Personal history of nicotine dependence: Secondary | ICD-10-CM | POA: Diagnosis not present

## 2018-12-04 DIAGNOSIS — Z7901 Long term (current) use of anticoagulants: Secondary | ICD-10-CM | POA: Diagnosis not present

## 2018-12-04 DIAGNOSIS — Z5181 Encounter for therapeutic drug level monitoring: Secondary | ICD-10-CM | POA: Diagnosis not present

## 2018-12-04 DIAGNOSIS — W01198D Fall on same level from slipping, tripping and stumbling with subsequent striking against other object, subsequent encounter: Secondary | ICD-10-CM | POA: Diagnosis not present

## 2018-12-04 DIAGNOSIS — S82245D Nondisplaced spiral fracture of shaft of left tibia, subsequent encounter for closed fracture with routine healing: Secondary | ICD-10-CM | POA: Diagnosis not present

## 2018-12-04 DIAGNOSIS — S22080D Wedge compression fracture of T11-T12 vertebra, subsequent encounter for fracture with routine healing: Secondary | ICD-10-CM | POA: Diagnosis not present

## 2018-12-04 DIAGNOSIS — Z8673 Personal history of transient ischemic attack (TIA), and cerebral infarction without residual deficits: Secondary | ICD-10-CM | POA: Diagnosis not present

## 2018-12-04 DIAGNOSIS — S8255XD Nondisplaced fracture of medial malleolus of left tibia, subsequent encounter for closed fracture with routine healing: Secondary | ICD-10-CM | POA: Diagnosis not present

## 2018-12-08 DIAGNOSIS — Z8673 Personal history of transient ischemic attack (TIA), and cerebral infarction without residual deficits: Secondary | ICD-10-CM | POA: Diagnosis not present

## 2018-12-08 DIAGNOSIS — Z5181 Encounter for therapeutic drug level monitoring: Secondary | ICD-10-CM | POA: Diagnosis not present

## 2018-12-08 DIAGNOSIS — Z7901 Long term (current) use of anticoagulants: Secondary | ICD-10-CM | POA: Diagnosis not present

## 2018-12-08 DIAGNOSIS — S8255XD Nondisplaced fracture of medial malleolus of left tibia, subsequent encounter for closed fracture with routine healing: Secondary | ICD-10-CM | POA: Diagnosis not present

## 2018-12-08 DIAGNOSIS — S22080D Wedge compression fracture of T11-T12 vertebra, subsequent encounter for fracture with routine healing: Secondary | ICD-10-CM | POA: Diagnosis not present

## 2018-12-08 DIAGNOSIS — F418 Other specified anxiety disorders: Secondary | ICD-10-CM | POA: Diagnosis not present

## 2018-12-08 DIAGNOSIS — E785 Hyperlipidemia, unspecified: Secondary | ICD-10-CM | POA: Diagnosis not present

## 2018-12-08 DIAGNOSIS — Z87891 Personal history of nicotine dependence: Secondary | ICD-10-CM | POA: Diagnosis not present

## 2018-12-08 DIAGNOSIS — M8000XD Age-related osteoporosis with current pathological fracture, unspecified site, subsequent encounter for fracture with routine healing: Secondary | ICD-10-CM | POA: Diagnosis not present

## 2018-12-08 DIAGNOSIS — S82245D Nondisplaced spiral fracture of shaft of left tibia, subsequent encounter for closed fracture with routine healing: Secondary | ICD-10-CM | POA: Diagnosis not present

## 2018-12-08 DIAGNOSIS — W01198D Fall on same level from slipping, tripping and stumbling with subsequent striking against other object, subsequent encounter: Secondary | ICD-10-CM | POA: Diagnosis not present

## 2018-12-08 DIAGNOSIS — I10 Essential (primary) hypertension: Secondary | ICD-10-CM | POA: Diagnosis not present

## 2018-12-08 DIAGNOSIS — E7211 Homocystinuria: Secondary | ICD-10-CM | POA: Diagnosis not present

## 2018-12-09 DIAGNOSIS — S82245D Nondisplaced spiral fracture of shaft of left tibia, subsequent encounter for closed fracture with routine healing: Secondary | ICD-10-CM | POA: Diagnosis not present

## 2018-12-09 DIAGNOSIS — S82402D Unspecified fracture of shaft of left fibula, subsequent encounter for closed fracture with routine healing: Secondary | ICD-10-CM | POA: Diagnosis not present

## 2018-12-09 DIAGNOSIS — Z87891 Personal history of nicotine dependence: Secondary | ICD-10-CM | POA: Diagnosis not present

## 2018-12-09 DIAGNOSIS — Z5181 Encounter for therapeutic drug level monitoring: Secondary | ICD-10-CM | POA: Diagnosis not present

## 2018-12-09 DIAGNOSIS — S82202D Unspecified fracture of shaft of left tibia, subsequent encounter for closed fracture with routine healing: Secondary | ICD-10-CM | POA: Diagnosis not present

## 2018-12-09 DIAGNOSIS — S8255XD Nondisplaced fracture of medial malleolus of left tibia, subsequent encounter for closed fracture with routine healing: Secondary | ICD-10-CM | POA: Diagnosis not present

## 2018-12-09 DIAGNOSIS — Z8673 Personal history of transient ischemic attack (TIA), and cerebral infarction without residual deficits: Secondary | ICD-10-CM | POA: Diagnosis not present

## 2018-12-09 DIAGNOSIS — Z7901 Long term (current) use of anticoagulants: Secondary | ICD-10-CM | POA: Diagnosis not present

## 2018-12-09 DIAGNOSIS — M25572 Pain in left ankle and joints of left foot: Secondary | ICD-10-CM | POA: Diagnosis not present

## 2018-12-09 DIAGNOSIS — W01198D Fall on same level from slipping, tripping and stumbling with subsequent striking against other object, subsequent encounter: Secondary | ICD-10-CM | POA: Diagnosis not present

## 2018-12-09 DIAGNOSIS — S22080D Wedge compression fracture of T11-T12 vertebra, subsequent encounter for fracture with routine healing: Secondary | ICD-10-CM | POA: Diagnosis not present

## 2018-12-10 DIAGNOSIS — Z87891 Personal history of nicotine dependence: Secondary | ICD-10-CM | POA: Diagnosis not present

## 2018-12-10 DIAGNOSIS — Z8673 Personal history of transient ischemic attack (TIA), and cerebral infarction without residual deficits: Secondary | ICD-10-CM | POA: Diagnosis not present

## 2018-12-10 DIAGNOSIS — Z7901 Long term (current) use of anticoagulants: Secondary | ICD-10-CM | POA: Diagnosis not present

## 2018-12-10 DIAGNOSIS — S22080D Wedge compression fracture of T11-T12 vertebra, subsequent encounter for fracture with routine healing: Secondary | ICD-10-CM | POA: Diagnosis not present

## 2018-12-10 DIAGNOSIS — W01198D Fall on same level from slipping, tripping and stumbling with subsequent striking against other object, subsequent encounter: Secondary | ICD-10-CM | POA: Diagnosis not present

## 2018-12-10 DIAGNOSIS — S8255XD Nondisplaced fracture of medial malleolus of left tibia, subsequent encounter for closed fracture with routine healing: Secondary | ICD-10-CM | POA: Diagnosis not present

## 2018-12-10 DIAGNOSIS — S82245D Nondisplaced spiral fracture of shaft of left tibia, subsequent encounter for closed fracture with routine healing: Secondary | ICD-10-CM | POA: Diagnosis not present

## 2018-12-10 DIAGNOSIS — Z5181 Encounter for therapeutic drug level monitoring: Secondary | ICD-10-CM | POA: Diagnosis not present

## 2018-12-11 DIAGNOSIS — Z5181 Encounter for therapeutic drug level monitoring: Secondary | ICD-10-CM | POA: Diagnosis not present

## 2018-12-11 DIAGNOSIS — W01198D Fall on same level from slipping, tripping and stumbling with subsequent striking against other object, subsequent encounter: Secondary | ICD-10-CM | POA: Diagnosis not present

## 2018-12-11 DIAGNOSIS — Z87891 Personal history of nicotine dependence: Secondary | ICD-10-CM | POA: Diagnosis not present

## 2018-12-11 DIAGNOSIS — S82245D Nondisplaced spiral fracture of shaft of left tibia, subsequent encounter for closed fracture with routine healing: Secondary | ICD-10-CM | POA: Diagnosis not present

## 2018-12-11 DIAGNOSIS — Z7901 Long term (current) use of anticoagulants: Secondary | ICD-10-CM | POA: Diagnosis not present

## 2018-12-11 DIAGNOSIS — Z8673 Personal history of transient ischemic attack (TIA), and cerebral infarction without residual deficits: Secondary | ICD-10-CM | POA: Diagnosis not present

## 2018-12-11 DIAGNOSIS — S8255XD Nondisplaced fracture of medial malleolus of left tibia, subsequent encounter for closed fracture with routine healing: Secondary | ICD-10-CM | POA: Diagnosis not present

## 2018-12-11 DIAGNOSIS — S22080D Wedge compression fracture of T11-T12 vertebra, subsequent encounter for fracture with routine healing: Secondary | ICD-10-CM | POA: Diagnosis not present

## 2018-12-16 DIAGNOSIS — S8255XD Nondisplaced fracture of medial malleolus of left tibia, subsequent encounter for closed fracture with routine healing: Secondary | ICD-10-CM | POA: Diagnosis not present

## 2018-12-16 DIAGNOSIS — Z5181 Encounter for therapeutic drug level monitoring: Secondary | ICD-10-CM | POA: Diagnosis not present

## 2018-12-16 DIAGNOSIS — S22080D Wedge compression fracture of T11-T12 vertebra, subsequent encounter for fracture with routine healing: Secondary | ICD-10-CM | POA: Diagnosis not present

## 2018-12-16 DIAGNOSIS — Z7901 Long term (current) use of anticoagulants: Secondary | ICD-10-CM | POA: Diagnosis not present

## 2018-12-16 DIAGNOSIS — W01198D Fall on same level from slipping, tripping and stumbling with subsequent striking against other object, subsequent encounter: Secondary | ICD-10-CM | POA: Diagnosis not present

## 2018-12-16 DIAGNOSIS — S82245D Nondisplaced spiral fracture of shaft of left tibia, subsequent encounter for closed fracture with routine healing: Secondary | ICD-10-CM | POA: Diagnosis not present

## 2018-12-16 DIAGNOSIS — Z87891 Personal history of nicotine dependence: Secondary | ICD-10-CM | POA: Diagnosis not present

## 2018-12-16 DIAGNOSIS — Z8673 Personal history of transient ischemic attack (TIA), and cerebral infarction without residual deficits: Secondary | ICD-10-CM | POA: Diagnosis not present

## 2018-12-18 DIAGNOSIS — Z8673 Personal history of transient ischemic attack (TIA), and cerebral infarction without residual deficits: Secondary | ICD-10-CM | POA: Diagnosis not present

## 2018-12-18 DIAGNOSIS — S22080D Wedge compression fracture of T11-T12 vertebra, subsequent encounter for fracture with routine healing: Secondary | ICD-10-CM | POA: Diagnosis not present

## 2018-12-18 DIAGNOSIS — W01198D Fall on same level from slipping, tripping and stumbling with subsequent striking against other object, subsequent encounter: Secondary | ICD-10-CM | POA: Diagnosis not present

## 2018-12-18 DIAGNOSIS — Z5181 Encounter for therapeutic drug level monitoring: Secondary | ICD-10-CM | POA: Diagnosis not present

## 2018-12-18 DIAGNOSIS — S8255XD Nondisplaced fracture of medial malleolus of left tibia, subsequent encounter for closed fracture with routine healing: Secondary | ICD-10-CM | POA: Diagnosis not present

## 2018-12-18 DIAGNOSIS — Z87891 Personal history of nicotine dependence: Secondary | ICD-10-CM | POA: Diagnosis not present

## 2018-12-18 DIAGNOSIS — S82245D Nondisplaced spiral fracture of shaft of left tibia, subsequent encounter for closed fracture with routine healing: Secondary | ICD-10-CM | POA: Diagnosis not present

## 2018-12-18 DIAGNOSIS — Z7901 Long term (current) use of anticoagulants: Secondary | ICD-10-CM | POA: Diagnosis not present

## 2018-12-19 DIAGNOSIS — S8255XD Nondisplaced fracture of medial malleolus of left tibia, subsequent encounter for closed fracture with routine healing: Secondary | ICD-10-CM | POA: Diagnosis not present

## 2018-12-19 DIAGNOSIS — Z87891 Personal history of nicotine dependence: Secondary | ICD-10-CM | POA: Diagnosis not present

## 2018-12-19 DIAGNOSIS — Z8673 Personal history of transient ischemic attack (TIA), and cerebral infarction without residual deficits: Secondary | ICD-10-CM | POA: Diagnosis not present

## 2018-12-19 DIAGNOSIS — W01198D Fall on same level from slipping, tripping and stumbling with subsequent striking against other object, subsequent encounter: Secondary | ICD-10-CM | POA: Diagnosis not present

## 2018-12-19 DIAGNOSIS — Z5181 Encounter for therapeutic drug level monitoring: Secondary | ICD-10-CM | POA: Diagnosis not present

## 2018-12-19 DIAGNOSIS — Z7901 Long term (current) use of anticoagulants: Secondary | ICD-10-CM | POA: Diagnosis not present

## 2018-12-19 DIAGNOSIS — S22080D Wedge compression fracture of T11-T12 vertebra, subsequent encounter for fracture with routine healing: Secondary | ICD-10-CM | POA: Diagnosis not present

## 2018-12-19 DIAGNOSIS — S82245D Nondisplaced spiral fracture of shaft of left tibia, subsequent encounter for closed fracture with routine healing: Secondary | ICD-10-CM | POA: Diagnosis not present

## 2018-12-26 DIAGNOSIS — S22080D Wedge compression fracture of T11-T12 vertebra, subsequent encounter for fracture with routine healing: Secondary | ICD-10-CM | POA: Diagnosis not present

## 2018-12-26 DIAGNOSIS — Z7901 Long term (current) use of anticoagulants: Secondary | ICD-10-CM | POA: Diagnosis not present

## 2018-12-26 DIAGNOSIS — S82245D Nondisplaced spiral fracture of shaft of left tibia, subsequent encounter for closed fracture with routine healing: Secondary | ICD-10-CM | POA: Diagnosis not present

## 2018-12-26 DIAGNOSIS — W01198D Fall on same level from slipping, tripping and stumbling with subsequent striking against other object, subsequent encounter: Secondary | ICD-10-CM | POA: Diagnosis not present

## 2018-12-26 DIAGNOSIS — Z87891 Personal history of nicotine dependence: Secondary | ICD-10-CM | POA: Diagnosis not present

## 2018-12-26 DIAGNOSIS — Z8673 Personal history of transient ischemic attack (TIA), and cerebral infarction without residual deficits: Secondary | ICD-10-CM | POA: Diagnosis not present

## 2018-12-26 DIAGNOSIS — S8255XD Nondisplaced fracture of medial malleolus of left tibia, subsequent encounter for closed fracture with routine healing: Secondary | ICD-10-CM | POA: Diagnosis not present

## 2018-12-29 DIAGNOSIS — M25572 Pain in left ankle and joints of left foot: Secondary | ICD-10-CM | POA: Diagnosis not present

## 2018-12-29 DIAGNOSIS — S82402D Unspecified fracture of shaft of left fibula, subsequent encounter for closed fracture with routine healing: Secondary | ICD-10-CM | POA: Diagnosis not present

## 2018-12-29 DIAGNOSIS — S82202D Unspecified fracture of shaft of left tibia, subsequent encounter for closed fracture with routine healing: Secondary | ICD-10-CM | POA: Diagnosis not present

## 2018-12-29 DIAGNOSIS — R791 Abnormal coagulation profile: Secondary | ICD-10-CM | POA: Diagnosis not present

## 2018-12-30 DIAGNOSIS — Z7901 Long term (current) use of anticoagulants: Secondary | ICD-10-CM | POA: Diagnosis not present

## 2018-12-30 DIAGNOSIS — S22080D Wedge compression fracture of T11-T12 vertebra, subsequent encounter for fracture with routine healing: Secondary | ICD-10-CM | POA: Diagnosis not present

## 2018-12-30 DIAGNOSIS — Z87891 Personal history of nicotine dependence: Secondary | ICD-10-CM | POA: Diagnosis not present

## 2018-12-30 DIAGNOSIS — Z8673 Personal history of transient ischemic attack (TIA), and cerebral infarction without residual deficits: Secondary | ICD-10-CM | POA: Diagnosis not present

## 2018-12-30 DIAGNOSIS — S82245D Nondisplaced spiral fracture of shaft of left tibia, subsequent encounter for closed fracture with routine healing: Secondary | ICD-10-CM | POA: Diagnosis not present

## 2018-12-30 DIAGNOSIS — W01198D Fall on same level from slipping, tripping and stumbling with subsequent striking against other object, subsequent encounter: Secondary | ICD-10-CM | POA: Diagnosis not present

## 2018-12-30 DIAGNOSIS — S8255XD Nondisplaced fracture of medial malleolus of left tibia, subsequent encounter for closed fracture with routine healing: Secondary | ICD-10-CM | POA: Diagnosis not present

## 2018-12-31 DIAGNOSIS — S22080D Wedge compression fracture of T11-T12 vertebra, subsequent encounter for fracture with routine healing: Secondary | ICD-10-CM | POA: Diagnosis not present

## 2018-12-31 DIAGNOSIS — Z8673 Personal history of transient ischemic attack (TIA), and cerebral infarction without residual deficits: Secondary | ICD-10-CM | POA: Diagnosis not present

## 2018-12-31 DIAGNOSIS — Z7901 Long term (current) use of anticoagulants: Secondary | ICD-10-CM | POA: Diagnosis not present

## 2018-12-31 DIAGNOSIS — W01198D Fall on same level from slipping, tripping and stumbling with subsequent striking against other object, subsequent encounter: Secondary | ICD-10-CM | POA: Diagnosis not present

## 2018-12-31 DIAGNOSIS — Z87891 Personal history of nicotine dependence: Secondary | ICD-10-CM | POA: Diagnosis not present

## 2018-12-31 DIAGNOSIS — S8255XD Nondisplaced fracture of medial malleolus of left tibia, subsequent encounter for closed fracture with routine healing: Secondary | ICD-10-CM | POA: Diagnosis not present

## 2018-12-31 DIAGNOSIS — S82245D Nondisplaced spiral fracture of shaft of left tibia, subsequent encounter for closed fracture with routine healing: Secondary | ICD-10-CM | POA: Diagnosis not present

## 2019-01-01 DIAGNOSIS — Z7901 Long term (current) use of anticoagulants: Secondary | ICD-10-CM | POA: Diagnosis not present

## 2019-01-01 DIAGNOSIS — Z8673 Personal history of transient ischemic attack (TIA), and cerebral infarction without residual deficits: Secondary | ICD-10-CM | POA: Diagnosis not present

## 2019-01-01 DIAGNOSIS — S22080D Wedge compression fracture of T11-T12 vertebra, subsequent encounter for fracture with routine healing: Secondary | ICD-10-CM | POA: Diagnosis not present

## 2019-01-01 DIAGNOSIS — S82245D Nondisplaced spiral fracture of shaft of left tibia, subsequent encounter for closed fracture with routine healing: Secondary | ICD-10-CM | POA: Diagnosis not present

## 2019-01-01 DIAGNOSIS — S8255XD Nondisplaced fracture of medial malleolus of left tibia, subsequent encounter for closed fracture with routine healing: Secondary | ICD-10-CM | POA: Diagnosis not present

## 2019-01-01 DIAGNOSIS — Z87891 Personal history of nicotine dependence: Secondary | ICD-10-CM | POA: Diagnosis not present

## 2019-01-01 DIAGNOSIS — W01198D Fall on same level from slipping, tripping and stumbling with subsequent striking against other object, subsequent encounter: Secondary | ICD-10-CM | POA: Diagnosis not present

## 2019-01-02 DIAGNOSIS — G629 Polyneuropathy, unspecified: Secondary | ICD-10-CM | POA: Diagnosis not present

## 2019-01-02 DIAGNOSIS — E7211 Homocystinuria: Secondary | ICD-10-CM | POA: Diagnosis not present

## 2019-01-02 DIAGNOSIS — S8255XD Nondisplaced fracture of medial malleolus of left tibia, subsequent encounter for closed fracture with routine healing: Secondary | ICD-10-CM | POA: Diagnosis not present

## 2019-01-02 DIAGNOSIS — S22080D Wedge compression fracture of T11-T12 vertebra, subsequent encounter for fracture with routine healing: Secondary | ICD-10-CM | POA: Diagnosis not present

## 2019-01-02 DIAGNOSIS — Z7901 Long term (current) use of anticoagulants: Secondary | ICD-10-CM | POA: Diagnosis not present

## 2019-01-02 DIAGNOSIS — Z87891 Personal history of nicotine dependence: Secondary | ICD-10-CM | POA: Diagnosis not present

## 2019-01-02 DIAGNOSIS — Z8673 Personal history of transient ischemic attack (TIA), and cerebral infarction without residual deficits: Secondary | ICD-10-CM | POA: Diagnosis not present

## 2019-01-02 DIAGNOSIS — E7800 Pure hypercholesterolemia, unspecified: Secondary | ICD-10-CM | POA: Diagnosis not present

## 2019-01-02 DIAGNOSIS — F325 Major depressive disorder, single episode, in full remission: Secondary | ICD-10-CM | POA: Diagnosis not present

## 2019-01-02 DIAGNOSIS — W01198D Fall on same level from slipping, tripping and stumbling with subsequent striking against other object, subsequent encounter: Secondary | ICD-10-CM | POA: Diagnosis not present

## 2019-01-02 DIAGNOSIS — S82245D Nondisplaced spiral fracture of shaft of left tibia, subsequent encounter for closed fracture with routine healing: Secondary | ICD-10-CM | POA: Diagnosis not present

## 2019-01-07 DIAGNOSIS — W01198D Fall on same level from slipping, tripping and stumbling with subsequent striking against other object, subsequent encounter: Secondary | ICD-10-CM | POA: Diagnosis not present

## 2019-01-07 DIAGNOSIS — S22080D Wedge compression fracture of T11-T12 vertebra, subsequent encounter for fracture with routine healing: Secondary | ICD-10-CM | POA: Diagnosis not present

## 2019-01-07 DIAGNOSIS — Z8673 Personal history of transient ischemic attack (TIA), and cerebral infarction without residual deficits: Secondary | ICD-10-CM | POA: Diagnosis not present

## 2019-01-07 DIAGNOSIS — Z7901 Long term (current) use of anticoagulants: Secondary | ICD-10-CM | POA: Diagnosis not present

## 2019-01-07 DIAGNOSIS — Z87891 Personal history of nicotine dependence: Secondary | ICD-10-CM | POA: Diagnosis not present

## 2019-01-07 DIAGNOSIS — S8255XD Nondisplaced fracture of medial malleolus of left tibia, subsequent encounter for closed fracture with routine healing: Secondary | ICD-10-CM | POA: Diagnosis not present

## 2019-01-07 DIAGNOSIS — S82245D Nondisplaced spiral fracture of shaft of left tibia, subsequent encounter for closed fracture with routine healing: Secondary | ICD-10-CM | POA: Diagnosis not present

## 2019-01-09 DIAGNOSIS — S82245D Nondisplaced spiral fracture of shaft of left tibia, subsequent encounter for closed fracture with routine healing: Secondary | ICD-10-CM | POA: Diagnosis not present

## 2019-01-09 DIAGNOSIS — Z8673 Personal history of transient ischemic attack (TIA), and cerebral infarction without residual deficits: Secondary | ICD-10-CM | POA: Diagnosis not present

## 2019-01-09 DIAGNOSIS — S22080D Wedge compression fracture of T11-T12 vertebra, subsequent encounter for fracture with routine healing: Secondary | ICD-10-CM | POA: Diagnosis not present

## 2019-01-09 DIAGNOSIS — Z7901 Long term (current) use of anticoagulants: Secondary | ICD-10-CM | POA: Diagnosis not present

## 2019-01-09 DIAGNOSIS — W01198D Fall on same level from slipping, tripping and stumbling with subsequent striking against other object, subsequent encounter: Secondary | ICD-10-CM | POA: Diagnosis not present

## 2019-01-09 DIAGNOSIS — Z87891 Personal history of nicotine dependence: Secondary | ICD-10-CM | POA: Diagnosis not present

## 2019-01-09 DIAGNOSIS — S8255XD Nondisplaced fracture of medial malleolus of left tibia, subsequent encounter for closed fracture with routine healing: Secondary | ICD-10-CM | POA: Diagnosis not present

## 2019-01-12 DIAGNOSIS — S82245D Nondisplaced spiral fracture of shaft of left tibia, subsequent encounter for closed fracture with routine healing: Secondary | ICD-10-CM | POA: Diagnosis not present

## 2019-01-12 DIAGNOSIS — Z87891 Personal history of nicotine dependence: Secondary | ICD-10-CM | POA: Diagnosis not present

## 2019-01-12 DIAGNOSIS — S8255XD Nondisplaced fracture of medial malleolus of left tibia, subsequent encounter for closed fracture with routine healing: Secondary | ICD-10-CM | POA: Diagnosis not present

## 2019-01-12 DIAGNOSIS — S22080D Wedge compression fracture of T11-T12 vertebra, subsequent encounter for fracture with routine healing: Secondary | ICD-10-CM | POA: Diagnosis not present

## 2019-01-12 DIAGNOSIS — Z7901 Long term (current) use of anticoagulants: Secondary | ICD-10-CM | POA: Diagnosis not present

## 2019-01-12 DIAGNOSIS — Z8673 Personal history of transient ischemic attack (TIA), and cerebral infarction without residual deficits: Secondary | ICD-10-CM | POA: Diagnosis not present

## 2019-01-12 DIAGNOSIS — W01198D Fall on same level from slipping, tripping and stumbling with subsequent striking against other object, subsequent encounter: Secondary | ICD-10-CM | POA: Diagnosis not present

## 2019-01-16 DIAGNOSIS — S22080D Wedge compression fracture of T11-T12 vertebra, subsequent encounter for fracture with routine healing: Secondary | ICD-10-CM | POA: Diagnosis not present

## 2019-01-16 DIAGNOSIS — W01198D Fall on same level from slipping, tripping and stumbling with subsequent striking against other object, subsequent encounter: Secondary | ICD-10-CM | POA: Diagnosis not present

## 2019-01-16 DIAGNOSIS — Z8673 Personal history of transient ischemic attack (TIA), and cerebral infarction without residual deficits: Secondary | ICD-10-CM | POA: Diagnosis not present

## 2019-01-16 DIAGNOSIS — Z7901 Long term (current) use of anticoagulants: Secondary | ICD-10-CM | POA: Diagnosis not present

## 2019-01-16 DIAGNOSIS — Z87891 Personal history of nicotine dependence: Secondary | ICD-10-CM | POA: Diagnosis not present

## 2019-01-16 DIAGNOSIS — S82245D Nondisplaced spiral fracture of shaft of left tibia, subsequent encounter for closed fracture with routine healing: Secondary | ICD-10-CM | POA: Diagnosis not present

## 2019-01-16 DIAGNOSIS — S8255XD Nondisplaced fracture of medial malleolus of left tibia, subsequent encounter for closed fracture with routine healing: Secondary | ICD-10-CM | POA: Diagnosis not present

## 2019-01-19 DIAGNOSIS — S8255XD Nondisplaced fracture of medial malleolus of left tibia, subsequent encounter for closed fracture with routine healing: Secondary | ICD-10-CM | POA: Diagnosis not present

## 2019-01-19 DIAGNOSIS — M25572 Pain in left ankle and joints of left foot: Secondary | ICD-10-CM | POA: Diagnosis not present

## 2019-01-19 DIAGNOSIS — S82245D Nondisplaced spiral fracture of shaft of left tibia, subsequent encounter for closed fracture with routine healing: Secondary | ICD-10-CM | POA: Diagnosis not present

## 2019-01-19 DIAGNOSIS — S22080D Wedge compression fracture of T11-T12 vertebra, subsequent encounter for fracture with routine healing: Secondary | ICD-10-CM | POA: Diagnosis not present

## 2019-01-19 DIAGNOSIS — S82402D Unspecified fracture of shaft of left fibula, subsequent encounter for closed fracture with routine healing: Secondary | ICD-10-CM | POA: Diagnosis not present

## 2019-01-19 DIAGNOSIS — W01198D Fall on same level from slipping, tripping and stumbling with subsequent striking against other object, subsequent encounter: Secondary | ICD-10-CM | POA: Diagnosis not present

## 2019-01-19 DIAGNOSIS — S82202D Unspecified fracture of shaft of left tibia, subsequent encounter for closed fracture with routine healing: Secondary | ICD-10-CM | POA: Diagnosis not present

## 2019-01-19 DIAGNOSIS — Z7901 Long term (current) use of anticoagulants: Secondary | ICD-10-CM | POA: Diagnosis not present

## 2019-01-20 DIAGNOSIS — Z7901 Long term (current) use of anticoagulants: Secondary | ICD-10-CM | POA: Diagnosis not present

## 2019-01-20 DIAGNOSIS — Z87891 Personal history of nicotine dependence: Secondary | ICD-10-CM | POA: Diagnosis not present

## 2019-01-20 DIAGNOSIS — S8255XD Nondisplaced fracture of medial malleolus of left tibia, subsequent encounter for closed fracture with routine healing: Secondary | ICD-10-CM | POA: Diagnosis not present

## 2019-01-20 DIAGNOSIS — S82245D Nondisplaced spiral fracture of shaft of left tibia, subsequent encounter for closed fracture with routine healing: Secondary | ICD-10-CM | POA: Diagnosis not present

## 2019-01-20 DIAGNOSIS — W01198D Fall on same level from slipping, tripping and stumbling with subsequent striking against other object, subsequent encounter: Secondary | ICD-10-CM | POA: Diagnosis not present

## 2019-01-20 DIAGNOSIS — S22080D Wedge compression fracture of T11-T12 vertebra, subsequent encounter for fracture with routine healing: Secondary | ICD-10-CM | POA: Diagnosis not present

## 2019-01-20 DIAGNOSIS — Z8673 Personal history of transient ischemic attack (TIA), and cerebral infarction without residual deficits: Secondary | ICD-10-CM | POA: Diagnosis not present

## 2019-01-21 DIAGNOSIS — S8255XD Nondisplaced fracture of medial malleolus of left tibia, subsequent encounter for closed fracture with routine healing: Secondary | ICD-10-CM | POA: Diagnosis not present

## 2019-01-21 DIAGNOSIS — S22080D Wedge compression fracture of T11-T12 vertebra, subsequent encounter for fracture with routine healing: Secondary | ICD-10-CM | POA: Diagnosis not present

## 2019-01-21 DIAGNOSIS — Z87891 Personal history of nicotine dependence: Secondary | ICD-10-CM | POA: Diagnosis not present

## 2019-01-21 DIAGNOSIS — S82245D Nondisplaced spiral fracture of shaft of left tibia, subsequent encounter for closed fracture with routine healing: Secondary | ICD-10-CM | POA: Diagnosis not present

## 2019-01-21 DIAGNOSIS — W01198D Fall on same level from slipping, tripping and stumbling with subsequent striking against other object, subsequent encounter: Secondary | ICD-10-CM | POA: Diagnosis not present

## 2019-01-21 DIAGNOSIS — Z7901 Long term (current) use of anticoagulants: Secondary | ICD-10-CM | POA: Diagnosis not present

## 2019-01-21 DIAGNOSIS — Z8673 Personal history of transient ischemic attack (TIA), and cerebral infarction without residual deficits: Secondary | ICD-10-CM | POA: Diagnosis not present

## 2019-01-26 DIAGNOSIS — Z8673 Personal history of transient ischemic attack (TIA), and cerebral infarction without residual deficits: Secondary | ICD-10-CM | POA: Diagnosis not present

## 2019-01-26 DIAGNOSIS — Z7901 Long term (current) use of anticoagulants: Secondary | ICD-10-CM | POA: Diagnosis not present

## 2019-01-26 DIAGNOSIS — S8255XD Nondisplaced fracture of medial malleolus of left tibia, subsequent encounter for closed fracture with routine healing: Secondary | ICD-10-CM | POA: Diagnosis not present

## 2019-01-26 DIAGNOSIS — S22080D Wedge compression fracture of T11-T12 vertebra, subsequent encounter for fracture with routine healing: Secondary | ICD-10-CM | POA: Diagnosis not present

## 2019-01-26 DIAGNOSIS — Z87891 Personal history of nicotine dependence: Secondary | ICD-10-CM | POA: Diagnosis not present

## 2019-01-26 DIAGNOSIS — S82245D Nondisplaced spiral fracture of shaft of left tibia, subsequent encounter for closed fracture with routine healing: Secondary | ICD-10-CM | POA: Diagnosis not present

## 2019-01-26 DIAGNOSIS — W01198D Fall on same level from slipping, tripping and stumbling with subsequent striking against other object, subsequent encounter: Secondary | ICD-10-CM | POA: Diagnosis not present

## 2019-01-28 DIAGNOSIS — S82245D Nondisplaced spiral fracture of shaft of left tibia, subsequent encounter for closed fracture with routine healing: Secondary | ICD-10-CM | POA: Diagnosis not present

## 2019-01-28 DIAGNOSIS — Z8673 Personal history of transient ischemic attack (TIA), and cerebral infarction without residual deficits: Secondary | ICD-10-CM | POA: Diagnosis not present

## 2019-01-28 DIAGNOSIS — W01198D Fall on same level from slipping, tripping and stumbling with subsequent striking against other object, subsequent encounter: Secondary | ICD-10-CM | POA: Diagnosis not present

## 2019-01-28 DIAGNOSIS — S8255XD Nondisplaced fracture of medial malleolus of left tibia, subsequent encounter for closed fracture with routine healing: Secondary | ICD-10-CM | POA: Diagnosis not present

## 2019-01-28 DIAGNOSIS — Z87891 Personal history of nicotine dependence: Secondary | ICD-10-CM | POA: Diagnosis not present

## 2019-01-28 DIAGNOSIS — Z7901 Long term (current) use of anticoagulants: Secondary | ICD-10-CM | POA: Diagnosis not present

## 2019-01-28 DIAGNOSIS — S22080D Wedge compression fracture of T11-T12 vertebra, subsequent encounter for fracture with routine healing: Secondary | ICD-10-CM | POA: Diagnosis not present

## 2019-01-29 DIAGNOSIS — S8255XD Nondisplaced fracture of medial malleolus of left tibia, subsequent encounter for closed fracture with routine healing: Secondary | ICD-10-CM | POA: Diagnosis not present

## 2019-01-29 DIAGNOSIS — W01198D Fall on same level from slipping, tripping and stumbling with subsequent striking against other object, subsequent encounter: Secondary | ICD-10-CM | POA: Diagnosis not present

## 2019-01-29 DIAGNOSIS — S82245D Nondisplaced spiral fracture of shaft of left tibia, subsequent encounter for closed fracture with routine healing: Secondary | ICD-10-CM | POA: Diagnosis not present

## 2019-01-29 DIAGNOSIS — Z87891 Personal history of nicotine dependence: Secondary | ICD-10-CM | POA: Diagnosis not present

## 2019-01-29 DIAGNOSIS — S22080D Wedge compression fracture of T11-T12 vertebra, subsequent encounter for fracture with routine healing: Secondary | ICD-10-CM | POA: Diagnosis not present

## 2019-01-29 DIAGNOSIS — Z7901 Long term (current) use of anticoagulants: Secondary | ICD-10-CM | POA: Diagnosis not present

## 2019-01-29 DIAGNOSIS — Z8673 Personal history of transient ischemic attack (TIA), and cerebral infarction without residual deficits: Secondary | ICD-10-CM | POA: Diagnosis not present

## 2019-02-02 DIAGNOSIS — S82402D Unspecified fracture of shaft of left fibula, subsequent encounter for closed fracture with routine healing: Secondary | ICD-10-CM | POA: Diagnosis not present

## 2019-02-02 DIAGNOSIS — S82202D Unspecified fracture of shaft of left tibia, subsequent encounter for closed fracture with routine healing: Secondary | ICD-10-CM | POA: Diagnosis not present

## 2019-02-02 DIAGNOSIS — M25572 Pain in left ankle and joints of left foot: Secondary | ICD-10-CM | POA: Diagnosis not present

## 2019-02-03 DIAGNOSIS — W01198D Fall on same level from slipping, tripping and stumbling with subsequent striking against other object, subsequent encounter: Secondary | ICD-10-CM | POA: Diagnosis not present

## 2019-02-03 DIAGNOSIS — S8255XD Nondisplaced fracture of medial malleolus of left tibia, subsequent encounter for closed fracture with routine healing: Secondary | ICD-10-CM | POA: Diagnosis not present

## 2019-02-03 DIAGNOSIS — Z87891 Personal history of nicotine dependence: Secondary | ICD-10-CM | POA: Diagnosis not present

## 2019-02-03 DIAGNOSIS — S82245D Nondisplaced spiral fracture of shaft of left tibia, subsequent encounter for closed fracture with routine healing: Secondary | ICD-10-CM | POA: Diagnosis not present

## 2019-02-03 DIAGNOSIS — S22080D Wedge compression fracture of T11-T12 vertebra, subsequent encounter for fracture with routine healing: Secondary | ICD-10-CM | POA: Diagnosis not present

## 2019-02-03 DIAGNOSIS — Z7901 Long term (current) use of anticoagulants: Secondary | ICD-10-CM | POA: Diagnosis not present

## 2019-02-03 DIAGNOSIS — Z8673 Personal history of transient ischemic attack (TIA), and cerebral infarction without residual deficits: Secondary | ICD-10-CM | POA: Diagnosis not present

## 2019-02-04 DIAGNOSIS — W01198D Fall on same level from slipping, tripping and stumbling with subsequent striking against other object, subsequent encounter: Secondary | ICD-10-CM | POA: Diagnosis not present

## 2019-02-04 DIAGNOSIS — S22080D Wedge compression fracture of T11-T12 vertebra, subsequent encounter for fracture with routine healing: Secondary | ICD-10-CM | POA: Diagnosis not present

## 2019-02-04 DIAGNOSIS — Z7901 Long term (current) use of anticoagulants: Secondary | ICD-10-CM | POA: Diagnosis not present

## 2019-02-04 DIAGNOSIS — Z8673 Personal history of transient ischemic attack (TIA), and cerebral infarction without residual deficits: Secondary | ICD-10-CM | POA: Diagnosis not present

## 2019-02-04 DIAGNOSIS — S8255XD Nondisplaced fracture of medial malleolus of left tibia, subsequent encounter for closed fracture with routine healing: Secondary | ICD-10-CM | POA: Diagnosis not present

## 2019-02-04 DIAGNOSIS — S82245D Nondisplaced spiral fracture of shaft of left tibia, subsequent encounter for closed fracture with routine healing: Secondary | ICD-10-CM | POA: Diagnosis not present

## 2019-02-04 DIAGNOSIS — Z87891 Personal history of nicotine dependence: Secondary | ICD-10-CM | POA: Diagnosis not present

## 2019-02-05 DIAGNOSIS — Z8673 Personal history of transient ischemic attack (TIA), and cerebral infarction without residual deficits: Secondary | ICD-10-CM | POA: Diagnosis not present

## 2019-02-05 DIAGNOSIS — Z87891 Personal history of nicotine dependence: Secondary | ICD-10-CM | POA: Diagnosis not present

## 2019-02-05 DIAGNOSIS — Z7901 Long term (current) use of anticoagulants: Secondary | ICD-10-CM | POA: Diagnosis not present

## 2019-02-05 DIAGNOSIS — S22080D Wedge compression fracture of T11-T12 vertebra, subsequent encounter for fracture with routine healing: Secondary | ICD-10-CM | POA: Diagnosis not present

## 2019-02-05 DIAGNOSIS — S82245D Nondisplaced spiral fracture of shaft of left tibia, subsequent encounter for closed fracture with routine healing: Secondary | ICD-10-CM | POA: Diagnosis not present

## 2019-02-05 DIAGNOSIS — W01198D Fall on same level from slipping, tripping and stumbling with subsequent striking against other object, subsequent encounter: Secondary | ICD-10-CM | POA: Diagnosis not present

## 2019-02-05 DIAGNOSIS — S8255XD Nondisplaced fracture of medial malleolus of left tibia, subsequent encounter for closed fracture with routine healing: Secondary | ICD-10-CM | POA: Diagnosis not present

## 2019-02-09 ENCOUNTER — Other Ambulatory Visit: Payer: Self-pay | Admitting: *Deleted

## 2019-02-09 DIAGNOSIS — Z1231 Encounter for screening mammogram for malignant neoplasm of breast: Secondary | ICD-10-CM

## 2019-02-11 ENCOUNTER — Encounter: Payer: Self-pay | Admitting: *Deleted

## 2019-03-20 ENCOUNTER — Ambulatory Visit
Admission: RE | Admit: 2019-03-20 | Discharge: 2019-03-20 | Disposition: A | Discharge status: Home / Self Care | Payer: Medicare HMO | Source: Ambulatory Visit | Attending: General Surgery | Admitting: General Surgery

## 2019-03-20 DIAGNOSIS — Z1231 Encounter for screening mammogram for malignant neoplasm of breast: Secondary | ICD-10-CM | POA: Insufficient documentation

## 2019-03-26 ENCOUNTER — Ambulatory Visit: Payer: Medicare HMO | Admitting: General Surgery

## 2019-04-02 DIAGNOSIS — G8190 Hemiplegia, unspecified affecting unspecified side: Secondary | ICD-10-CM | POA: Diagnosis not present

## 2019-04-02 DIAGNOSIS — S82302D Unspecified fracture of lower end of left tibia, subsequent encounter for closed fracture with routine healing: Secondary | ICD-10-CM | POA: Diagnosis not present

## 2019-04-02 DIAGNOSIS — Z8673 Personal history of transient ischemic attack (TIA), and cerebral infarction without residual deficits: Secondary | ICD-10-CM | POA: Diagnosis not present

## 2019-04-02 DIAGNOSIS — Z7901 Long term (current) use of anticoagulants: Secondary | ICD-10-CM | POA: Diagnosis not present

## 2019-04-02 DIAGNOSIS — Z9181 History of falling: Secondary | ICD-10-CM | POA: Diagnosis not present

## 2019-04-02 DIAGNOSIS — Z79891 Long term (current) use of opiate analgesic: Secondary | ICD-10-CM | POA: Diagnosis not present

## 2019-04-02 DIAGNOSIS — M6281 Muscle weakness (generalized): Secondary | ICD-10-CM | POA: Diagnosis not present

## 2019-04-02 DIAGNOSIS — I2510 Atherosclerotic heart disease of native coronary artery without angina pectoris: Secondary | ICD-10-CM | POA: Diagnosis not present

## 2019-04-02 DIAGNOSIS — S82832D Other fracture of upper and lower end of left fibula, subsequent encounter for closed fracture with routine healing: Secondary | ICD-10-CM | POA: Diagnosis not present

## 2019-04-03 ENCOUNTER — Ambulatory Visit: Payer: Medicare HMO | Admitting: Surgery

## 2019-04-06 DIAGNOSIS — Z8673 Personal history of transient ischemic attack (TIA), and cerebral infarction without residual deficits: Secondary | ICD-10-CM | POA: Diagnosis not present

## 2019-04-06 DIAGNOSIS — M6281 Muscle weakness (generalized): Secondary | ICD-10-CM | POA: Diagnosis not present

## 2019-04-06 DIAGNOSIS — Z9181 History of falling: Secondary | ICD-10-CM | POA: Diagnosis not present

## 2019-04-06 DIAGNOSIS — Z79891 Long term (current) use of opiate analgesic: Secondary | ICD-10-CM | POA: Diagnosis not present

## 2019-04-06 DIAGNOSIS — G8190 Hemiplegia, unspecified affecting unspecified side: Secondary | ICD-10-CM | POA: Diagnosis not present

## 2019-04-06 DIAGNOSIS — S82832D Other fracture of upper and lower end of left fibula, subsequent encounter for closed fracture with routine healing: Secondary | ICD-10-CM | POA: Diagnosis not present

## 2019-04-06 DIAGNOSIS — I2510 Atherosclerotic heart disease of native coronary artery without angina pectoris: Secondary | ICD-10-CM | POA: Diagnosis not present

## 2019-04-06 DIAGNOSIS — Z7901 Long term (current) use of anticoagulants: Secondary | ICD-10-CM | POA: Diagnosis not present

## 2019-04-06 DIAGNOSIS — S82302D Unspecified fracture of lower end of left tibia, subsequent encounter for closed fracture with routine healing: Secondary | ICD-10-CM | POA: Diagnosis not present

## 2019-04-09 DIAGNOSIS — I2510 Atherosclerotic heart disease of native coronary artery without angina pectoris: Secondary | ICD-10-CM | POA: Diagnosis not present

## 2019-04-09 DIAGNOSIS — S82832D Other fracture of upper and lower end of left fibula, subsequent encounter for closed fracture with routine healing: Secondary | ICD-10-CM | POA: Diagnosis not present

## 2019-04-09 DIAGNOSIS — Z9181 History of falling: Secondary | ICD-10-CM | POA: Diagnosis not present

## 2019-04-09 DIAGNOSIS — G8190 Hemiplegia, unspecified affecting unspecified side: Secondary | ICD-10-CM | POA: Diagnosis not present

## 2019-04-09 DIAGNOSIS — S82302D Unspecified fracture of lower end of left tibia, subsequent encounter for closed fracture with routine healing: Secondary | ICD-10-CM | POA: Diagnosis not present

## 2019-04-09 DIAGNOSIS — M6281 Muscle weakness (generalized): Secondary | ICD-10-CM | POA: Diagnosis not present

## 2019-04-09 DIAGNOSIS — Z79891 Long term (current) use of opiate analgesic: Secondary | ICD-10-CM | POA: Diagnosis not present

## 2019-04-09 DIAGNOSIS — Z7901 Long term (current) use of anticoagulants: Secondary | ICD-10-CM | POA: Diagnosis not present

## 2019-04-09 DIAGNOSIS — Z8673 Personal history of transient ischemic attack (TIA), and cerebral infarction without residual deficits: Secondary | ICD-10-CM | POA: Diagnosis not present

## 2019-04-13 DIAGNOSIS — S82302D Unspecified fracture of lower end of left tibia, subsequent encounter for closed fracture with routine healing: Secondary | ICD-10-CM | POA: Diagnosis not present

## 2019-04-13 DIAGNOSIS — S82832D Other fracture of upper and lower end of left fibula, subsequent encounter for closed fracture with routine healing: Secondary | ICD-10-CM | POA: Diagnosis not present

## 2019-04-13 DIAGNOSIS — G8190 Hemiplegia, unspecified affecting unspecified side: Secondary | ICD-10-CM | POA: Diagnosis not present

## 2019-04-13 DIAGNOSIS — Z79891 Long term (current) use of opiate analgesic: Secondary | ICD-10-CM | POA: Diagnosis not present

## 2019-04-13 DIAGNOSIS — Z9181 History of falling: Secondary | ICD-10-CM | POA: Diagnosis not present

## 2019-04-13 DIAGNOSIS — M6281 Muscle weakness (generalized): Secondary | ICD-10-CM | POA: Diagnosis not present

## 2019-04-13 DIAGNOSIS — I2510 Atherosclerotic heart disease of native coronary artery without angina pectoris: Secondary | ICD-10-CM | POA: Diagnosis not present

## 2019-04-13 DIAGNOSIS — Z8673 Personal history of transient ischemic attack (TIA), and cerebral infarction without residual deficits: Secondary | ICD-10-CM | POA: Diagnosis not present

## 2019-04-13 DIAGNOSIS — Z7901 Long term (current) use of anticoagulants: Secondary | ICD-10-CM | POA: Diagnosis not present

## 2019-04-15 DIAGNOSIS — Z9181 History of falling: Secondary | ICD-10-CM | POA: Diagnosis not present

## 2019-04-15 DIAGNOSIS — S82832D Other fracture of upper and lower end of left fibula, subsequent encounter for closed fracture with routine healing: Secondary | ICD-10-CM | POA: Diagnosis not present

## 2019-04-15 DIAGNOSIS — S82302D Unspecified fracture of lower end of left tibia, subsequent encounter for closed fracture with routine healing: Secondary | ICD-10-CM | POA: Diagnosis not present

## 2019-04-15 DIAGNOSIS — M6281 Muscle weakness (generalized): Secondary | ICD-10-CM | POA: Diagnosis not present

## 2019-04-15 DIAGNOSIS — I2510 Atherosclerotic heart disease of native coronary artery without angina pectoris: Secondary | ICD-10-CM | POA: Diagnosis not present

## 2019-04-15 DIAGNOSIS — G8190 Hemiplegia, unspecified affecting unspecified side: Secondary | ICD-10-CM | POA: Diagnosis not present

## 2019-04-15 DIAGNOSIS — Z7901 Long term (current) use of anticoagulants: Secondary | ICD-10-CM | POA: Diagnosis not present

## 2019-04-15 DIAGNOSIS — Z79891 Long term (current) use of opiate analgesic: Secondary | ICD-10-CM | POA: Diagnosis not present

## 2019-04-15 DIAGNOSIS — Z8673 Personal history of transient ischemic attack (TIA), and cerebral infarction without residual deficits: Secondary | ICD-10-CM | POA: Diagnosis not present

## 2019-04-21 DIAGNOSIS — S82832D Other fracture of upper and lower end of left fibula, subsequent encounter for closed fracture with routine healing: Secondary | ICD-10-CM | POA: Diagnosis not present

## 2019-04-21 DIAGNOSIS — E7800 Pure hypercholesterolemia, unspecified: Secondary | ICD-10-CM | POA: Diagnosis not present

## 2019-04-21 DIAGNOSIS — S82302D Unspecified fracture of lower end of left tibia, subsequent encounter for closed fracture with routine healing: Secondary | ICD-10-CM | POA: Diagnosis not present

## 2019-04-21 DIAGNOSIS — Z79891 Long term (current) use of opiate analgesic: Secondary | ICD-10-CM | POA: Diagnosis not present

## 2019-04-21 DIAGNOSIS — Z8673 Personal history of transient ischemic attack (TIA), and cerebral infarction without residual deficits: Secondary | ICD-10-CM | POA: Diagnosis not present

## 2019-04-21 DIAGNOSIS — G8190 Hemiplegia, unspecified affecting unspecified side: Secondary | ICD-10-CM | POA: Diagnosis not present

## 2019-04-21 DIAGNOSIS — G629 Polyneuropathy, unspecified: Secondary | ICD-10-CM | POA: Diagnosis not present

## 2019-04-21 DIAGNOSIS — M6281 Muscle weakness (generalized): Secondary | ICD-10-CM | POA: Diagnosis not present

## 2019-04-21 DIAGNOSIS — Z9181 History of falling: Secondary | ICD-10-CM | POA: Diagnosis not present

## 2019-04-21 DIAGNOSIS — E7211 Homocystinuria: Secondary | ICD-10-CM | POA: Diagnosis not present

## 2019-04-21 DIAGNOSIS — I2510 Atherosclerotic heart disease of native coronary artery without angina pectoris: Secondary | ICD-10-CM | POA: Diagnosis not present

## 2019-04-21 DIAGNOSIS — Z7901 Long term (current) use of anticoagulants: Secondary | ICD-10-CM | POA: Diagnosis not present

## 2019-04-21 DIAGNOSIS — R791 Abnormal coagulation profile: Secondary | ICD-10-CM | POA: Diagnosis not present

## 2019-04-24 DIAGNOSIS — Z7901 Long term (current) use of anticoagulants: Secondary | ICD-10-CM | POA: Diagnosis not present

## 2019-04-24 DIAGNOSIS — M6281 Muscle weakness (generalized): Secondary | ICD-10-CM | POA: Diagnosis not present

## 2019-04-24 DIAGNOSIS — Z8673 Personal history of transient ischemic attack (TIA), and cerebral infarction without residual deficits: Secondary | ICD-10-CM | POA: Diagnosis not present

## 2019-04-24 DIAGNOSIS — S82832D Other fracture of upper and lower end of left fibula, subsequent encounter for closed fracture with routine healing: Secondary | ICD-10-CM | POA: Diagnosis not present

## 2019-04-24 DIAGNOSIS — S82302D Unspecified fracture of lower end of left tibia, subsequent encounter for closed fracture with routine healing: Secondary | ICD-10-CM | POA: Diagnosis not present

## 2019-04-24 DIAGNOSIS — I2510 Atherosclerotic heart disease of native coronary artery without angina pectoris: Secondary | ICD-10-CM | POA: Diagnosis not present

## 2019-04-24 DIAGNOSIS — G8190 Hemiplegia, unspecified affecting unspecified side: Secondary | ICD-10-CM | POA: Diagnosis not present

## 2019-04-24 DIAGNOSIS — Z79891 Long term (current) use of opiate analgesic: Secondary | ICD-10-CM | POA: Diagnosis not present

## 2019-04-24 DIAGNOSIS — Z9181 History of falling: Secondary | ICD-10-CM | POA: Diagnosis not present

## 2019-04-28 DIAGNOSIS — I2510 Atherosclerotic heart disease of native coronary artery without angina pectoris: Secondary | ICD-10-CM | POA: Diagnosis not present

## 2019-04-28 DIAGNOSIS — M6281 Muscle weakness (generalized): Secondary | ICD-10-CM | POA: Diagnosis not present

## 2019-04-28 DIAGNOSIS — S82302D Unspecified fracture of lower end of left tibia, subsequent encounter for closed fracture with routine healing: Secondary | ICD-10-CM | POA: Diagnosis not present

## 2019-04-28 DIAGNOSIS — Z8673 Personal history of transient ischemic attack (TIA), and cerebral infarction without residual deficits: Secondary | ICD-10-CM | POA: Diagnosis not present

## 2019-04-28 DIAGNOSIS — Z7901 Long term (current) use of anticoagulants: Secondary | ICD-10-CM | POA: Diagnosis not present

## 2019-04-28 DIAGNOSIS — S82832D Other fracture of upper and lower end of left fibula, subsequent encounter for closed fracture with routine healing: Secondary | ICD-10-CM | POA: Diagnosis not present

## 2019-04-28 DIAGNOSIS — G8190 Hemiplegia, unspecified affecting unspecified side: Secondary | ICD-10-CM | POA: Diagnosis not present

## 2019-04-28 DIAGNOSIS — Z9181 History of falling: Secondary | ICD-10-CM | POA: Diagnosis not present

## 2019-04-28 DIAGNOSIS — Z79891 Long term (current) use of opiate analgesic: Secondary | ICD-10-CM | POA: Diagnosis not present

## 2019-05-05 DIAGNOSIS — R791 Abnormal coagulation profile: Secondary | ICD-10-CM | POA: Diagnosis not present

## 2019-05-05 DIAGNOSIS — Z0189 Encounter for other specified special examinations: Secondary | ICD-10-CM | POA: Diagnosis not present

## 2019-05-12 DIAGNOSIS — S82302D Unspecified fracture of lower end of left tibia, subsequent encounter for closed fracture with routine healing: Secondary | ICD-10-CM | POA: Diagnosis not present

## 2019-05-12 DIAGNOSIS — Z7901 Long term (current) use of anticoagulants: Secondary | ICD-10-CM | POA: Diagnosis not present

## 2019-05-12 DIAGNOSIS — Z79891 Long term (current) use of opiate analgesic: Secondary | ICD-10-CM | POA: Diagnosis not present

## 2019-05-12 DIAGNOSIS — M6281 Muscle weakness (generalized): Secondary | ICD-10-CM | POA: Diagnosis not present

## 2019-05-12 DIAGNOSIS — S82832D Other fracture of upper and lower end of left fibula, subsequent encounter for closed fracture with routine healing: Secondary | ICD-10-CM | POA: Diagnosis not present

## 2019-05-12 DIAGNOSIS — Z9181 History of falling: Secondary | ICD-10-CM | POA: Diagnosis not present

## 2019-05-12 DIAGNOSIS — I2510 Atherosclerotic heart disease of native coronary artery without angina pectoris: Secondary | ICD-10-CM | POA: Diagnosis not present

## 2019-05-12 DIAGNOSIS — Z8673 Personal history of transient ischemic attack (TIA), and cerebral infarction without residual deficits: Secondary | ICD-10-CM | POA: Diagnosis not present

## 2019-05-12 DIAGNOSIS — G8190 Hemiplegia, unspecified affecting unspecified side: Secondary | ICD-10-CM | POA: Diagnosis not present

## 2019-05-21 DIAGNOSIS — M6281 Muscle weakness (generalized): Secondary | ICD-10-CM | POA: Diagnosis not present

## 2019-05-21 DIAGNOSIS — Z79891 Long term (current) use of opiate analgesic: Secondary | ICD-10-CM | POA: Diagnosis not present

## 2019-05-21 DIAGNOSIS — G8190 Hemiplegia, unspecified affecting unspecified side: Secondary | ICD-10-CM | POA: Diagnosis not present

## 2019-05-21 DIAGNOSIS — S82832D Other fracture of upper and lower end of left fibula, subsequent encounter for closed fracture with routine healing: Secondary | ICD-10-CM | POA: Diagnosis not present

## 2019-05-21 DIAGNOSIS — Z7901 Long term (current) use of anticoagulants: Secondary | ICD-10-CM | POA: Diagnosis not present

## 2019-05-21 DIAGNOSIS — I2510 Atherosclerotic heart disease of native coronary artery without angina pectoris: Secondary | ICD-10-CM | POA: Diagnosis not present

## 2019-05-21 DIAGNOSIS — Z8673 Personal history of transient ischemic attack (TIA), and cerebral infarction without residual deficits: Secondary | ICD-10-CM | POA: Diagnosis not present

## 2019-05-21 DIAGNOSIS — Z9181 History of falling: Secondary | ICD-10-CM | POA: Diagnosis not present

## 2019-06-03 ENCOUNTER — Encounter: Payer: Self-pay | Admitting: *Deleted

## 2019-06-04 DIAGNOSIS — N8110 Cystocele, unspecified: Secondary | ICD-10-CM | POA: Diagnosis not present

## 2019-06-04 DIAGNOSIS — M546 Pain in thoracic spine: Secondary | ICD-10-CM | POA: Diagnosis not present

## 2019-06-04 DIAGNOSIS — R791 Abnormal coagulation profile: Secondary | ICD-10-CM | POA: Diagnosis not present

## 2019-06-04 DIAGNOSIS — Z23 Encounter for immunization: Secondary | ICD-10-CM | POA: Diagnosis not present

## 2019-06-04 DIAGNOSIS — M47814 Spondylosis without myelopathy or radiculopathy, thoracic region: Secondary | ICD-10-CM | POA: Diagnosis not present

## 2019-09-17 DIAGNOSIS — K5909 Other constipation: Secondary | ICD-10-CM | POA: Diagnosis not present

## 2019-09-17 DIAGNOSIS — Z0189 Encounter for other specified special examinations: Secondary | ICD-10-CM | POA: Diagnosis not present

## 2019-09-17 DIAGNOSIS — R791 Abnormal coagulation profile: Secondary | ICD-10-CM | POA: Diagnosis not present

## 2019-09-17 DIAGNOSIS — N816 Rectocele: Secondary | ICD-10-CM | POA: Diagnosis not present

## 2019-09-17 DIAGNOSIS — M6289 Other specified disorders of muscle: Secondary | ICD-10-CM | POA: Diagnosis not present

## 2019-09-21 DIAGNOSIS — Z7901 Long term (current) use of anticoagulants: Secondary | ICD-10-CM | POA: Diagnosis not present

## 2019-09-21 DIAGNOSIS — F325 Major depressive disorder, single episode, in full remission: Secondary | ICD-10-CM | POA: Diagnosis not present

## 2019-09-21 DIAGNOSIS — Z8673 Personal history of transient ischemic attack (TIA), and cerebral infarction without residual deficits: Secondary | ICD-10-CM | POA: Diagnosis not present

## 2019-09-21 DIAGNOSIS — Z87891 Personal history of nicotine dependence: Secondary | ICD-10-CM | POA: Diagnosis not present

## 2019-09-21 DIAGNOSIS — E7211 Homocystinuria: Secondary | ICD-10-CM | POA: Diagnosis not present

## 2019-09-22 DIAGNOSIS — Z7901 Long term (current) use of anticoagulants: Secondary | ICD-10-CM | POA: Diagnosis not present

## 2019-10-04 ENCOUNTER — Emergency Department: Payer: Medicare HMO

## 2019-10-04 ENCOUNTER — Other Ambulatory Visit: Payer: Self-pay

## 2019-10-04 ENCOUNTER — Inpatient Hospital Stay
Admission: EM | Admit: 2019-10-04 | Discharge: 2019-10-09 | DRG: 897 | Disposition: A | Discharge status: Home / Self Care | Payer: Medicare HMO | Attending: Internal Medicine | Admitting: Internal Medicine

## 2019-10-04 DIAGNOSIS — E860 Dehydration: Secondary | ICD-10-CM | POA: Diagnosis present

## 2019-10-04 DIAGNOSIS — Z79899 Other long term (current) drug therapy: Secondary | ICD-10-CM | POA: Diagnosis not present

## 2019-10-04 DIAGNOSIS — F32A Depression, unspecified: Secondary | ICD-10-CM | POA: Diagnosis present

## 2019-10-04 DIAGNOSIS — Z789 Other specified health status: Secondary | ICD-10-CM

## 2019-10-04 DIAGNOSIS — E785 Hyperlipidemia, unspecified: Secondary | ICD-10-CM | POA: Diagnosis present

## 2019-10-04 DIAGNOSIS — W010XXA Fall on same level from slipping, tripping and stumbling without subsequent striking against object, initial encounter: Secondary | ICD-10-CM | POA: Diagnosis present

## 2019-10-04 DIAGNOSIS — S8991XA Unspecified injury of right lower leg, initial encounter: Secondary | ICD-10-CM | POA: Diagnosis not present

## 2019-10-04 DIAGNOSIS — K219 Gastro-esophageal reflux disease without esophagitis: Secondary | ICD-10-CM | POA: Diagnosis not present

## 2019-10-04 DIAGNOSIS — F10231 Alcohol dependence with withdrawal delirium: Secondary | ICD-10-CM | POA: Diagnosis present

## 2019-10-04 DIAGNOSIS — E871 Hypo-osmolality and hyponatremia: Secondary | ICD-10-CM | POA: Diagnosis not present

## 2019-10-04 DIAGNOSIS — Z823 Family history of stroke: Secondary | ICD-10-CM | POA: Diagnosis not present

## 2019-10-04 DIAGNOSIS — Z853 Personal history of malignant neoplasm of breast: Secondary | ICD-10-CM

## 2019-10-04 DIAGNOSIS — Z87891 Personal history of nicotine dependence: Secondary | ICD-10-CM | POA: Diagnosis not present

## 2019-10-04 DIAGNOSIS — K709 Alcoholic liver disease, unspecified: Secondary | ICD-10-CM

## 2019-10-04 DIAGNOSIS — I10 Essential (primary) hypertension: Secondary | ICD-10-CM | POA: Diagnosis present

## 2019-10-04 DIAGNOSIS — F10930 Alcohol use, unspecified with withdrawal, uncomplicated: Secondary | ICD-10-CM

## 2019-10-04 DIAGNOSIS — Z7901 Long term (current) use of anticoagulants: Secondary | ICD-10-CM

## 2019-10-04 DIAGNOSIS — R251 Tremor, unspecified: Secondary | ICD-10-CM | POA: Diagnosis not present

## 2019-10-04 DIAGNOSIS — I69354 Hemiplegia and hemiparesis following cerebral infarction affecting left non-dominant side: Secondary | ICD-10-CM | POA: Diagnosis not present

## 2019-10-04 DIAGNOSIS — R531 Weakness: Secondary | ICD-10-CM

## 2019-10-04 DIAGNOSIS — W19XXXA Unspecified fall, initial encounter: Secondary | ICD-10-CM

## 2019-10-04 DIAGNOSIS — E7211 Homocystinuria: Secondary | ICD-10-CM | POA: Diagnosis present

## 2019-10-04 DIAGNOSIS — Z9012 Acquired absence of left breast and nipple: Secondary | ICD-10-CM

## 2019-10-04 DIAGNOSIS — I6930 Unspecified sequelae of cerebral infarction: Secondary | ICD-10-CM

## 2019-10-04 DIAGNOSIS — F329 Major depressive disorder, single episode, unspecified: Secondary | ICD-10-CM | POA: Diagnosis present

## 2019-10-04 DIAGNOSIS — G629 Polyneuropathy, unspecified: Secondary | ICD-10-CM | POA: Diagnosis present

## 2019-10-04 DIAGNOSIS — F419 Anxiety disorder, unspecified: Secondary | ICD-10-CM | POA: Diagnosis present

## 2019-10-04 DIAGNOSIS — M25561 Pain in right knee: Secondary | ICD-10-CM | POA: Diagnosis not present

## 2019-10-04 DIAGNOSIS — Z20822 Contact with and (suspected) exposure to covid-19: Secondary | ICD-10-CM | POA: Diagnosis present

## 2019-10-04 DIAGNOSIS — Z803 Family history of malignant neoplasm of breast: Secondary | ICD-10-CM

## 2019-10-04 DIAGNOSIS — F10239 Alcohol dependence with withdrawal, unspecified: Secondary | ICD-10-CM | POA: Diagnosis not present

## 2019-10-04 DIAGNOSIS — F10230 Alcohol dependence with withdrawal, uncomplicated: Secondary | ICD-10-CM | POA: Diagnosis not present

## 2019-10-04 DIAGNOSIS — Z7289 Other problems related to lifestyle: Secondary | ICD-10-CM

## 2019-10-04 DIAGNOSIS — Y901 Blood alcohol level of 20-39 mg/100 ml: Secondary | ICD-10-CM | POA: Diagnosis present

## 2019-10-04 DIAGNOSIS — Z79891 Long term (current) use of opiate analgesic: Secondary | ICD-10-CM

## 2019-10-04 DIAGNOSIS — F1090 Alcohol use, unspecified, uncomplicated: Secondary | ICD-10-CM

## 2019-10-04 DIAGNOSIS — R200 Anesthesia of skin: Secondary | ICD-10-CM | POA: Diagnosis not present

## 2019-10-04 LAB — COMPREHENSIVE METABOLIC PANEL
ALT: 81 U/L — ABNORMAL HIGH (ref 0–44)
AST: 119 U/L — ABNORMAL HIGH (ref 15–41)
Albumin: 4.6 g/dL (ref 3.5–5.0)
Alkaline Phosphatase: 95 U/L (ref 38–126)
Anion gap: 19 — ABNORMAL HIGH (ref 5–15)
BUN: 7 mg/dL (ref 6–20)
CO2: 22 mmol/L (ref 22–32)
Calcium: 8.6 mg/dL — ABNORMAL LOW (ref 8.9–10.3)
Chloride: 90 mmol/L — ABNORMAL LOW (ref 98–111)
Creatinine, Ser: 0.64 mg/dL (ref 0.44–1.00)
GFR calc Af Amer: >60 mL/min (ref >60)
GFR calc non Af Amer: >60 mL/min (ref >60)
Glucose, Bld: 88 mg/dL (ref 70–99)
Potassium: 4.1 mmol/L (ref 3.5–5.1)
Sodium: 131 mmol/L — ABNORMAL LOW (ref 135–145)
Total Bilirubin: 1.3 mg/dL — ABNORMAL HIGH (ref 0.3–1.2)
Total Protein: 7.9 g/dL (ref 6.5–8.1)

## 2019-10-04 LAB — CBC
HCT: 44.8 % (ref 36.0–46.0)
Hemoglobin: 15.3 g/dL — ABNORMAL HIGH (ref 12.0–15.0)
MCH: 32.7 pg (ref 26.0–34.0)
MCHC: 34.2 g/dL (ref 30.0–36.0)
MCV: 95.7 fL (ref 80.0–100.0)
Platelets: 269 10*3/uL (ref 150–400)
RBC: 4.68 MIL/uL (ref 3.87–5.11)
RDW: 13.3 % (ref 11.5–15.5)
WBC: 7 10*3/uL (ref 4.0–10.5)
nRBC: 0 % (ref 0.0–0.2)

## 2019-10-04 LAB — TROPONIN I (HIGH SENSITIVITY)
Troponin I (High Sensitivity): 11 ng/L (ref <18)
Troponin I (High Sensitivity): 18 ng/L — ABNORMAL HIGH (ref <18)

## 2019-10-04 LAB — PROTIME-INR
INR: 2 — ABNORMAL HIGH (ref 0.8–1.2)
Prothrombin Time: 22.7 seconds — ABNORMAL HIGH (ref 11.4–15.2)

## 2019-10-04 LAB — ETHANOL: Alcohol, Ethyl (B): 36 mg/dL — ABNORMAL HIGH (ref <10)

## 2019-10-04 MED ORDER — LORAZEPAM 2 MG/ML IJ SOLN
2.0000 mg | Freq: Once | INTRAMUSCULAR | Status: AC
  Administered 2019-10-04: 23:00:00 2 mg via INTRAVENOUS
  Filled 2019-10-04: qty 1

## 2019-10-04 MED ORDER — SODIUM CHLORIDE 0.9 % IV BOLUS
1000.0000 mL | Freq: Once | INTRAVENOUS | Status: AC
  Administered 2019-10-04: 23:00:00 1000 mL via INTRAVENOUS

## 2019-10-04 MED ORDER — SODIUM CHLORIDE 0.9 % IV SOLN
1.0000 mg | Freq: Once | INTRAVENOUS | Status: AC
  Administered 2019-10-04: 23:00:00 1 mg via INTRAVENOUS
  Filled 2019-10-04: qty 0.2

## 2019-10-04 MED ORDER — THIAMINE HCL 100 MG/ML IJ SOLN
100.0000 mg | Freq: Once | INTRAMUSCULAR | Status: AC
  Administered 2019-10-04: 23:00:00 100 mg via INTRAVENOUS
  Filled 2019-10-04: qty 2

## 2019-10-04 NOTE — ED Triage Notes (Addendum)
Patient c/o weakness beginning this am getting progressively worse throughout the day. Patient normally able to ambulate with walker;  Patient required 2-person assist to get from car to wheelchair today. Patient denies pain. Patient reports she thinks she's dehydrated. Patient also reports she's out of all of her medications since this am.   Patient also reports fall a few days ago. Bruising and swelling seen to right knee. Patient has not been evaluated by MD for this fall.

## 2019-10-04 NOTE — ED Provider Notes (Signed)
[facility] Emergency Department Provider Note  ____________________________________________   First MD Initiated Contact with Patient 10/04/19 2044     (approximate)  I have reviewed the triage vital signs and the nursing notes.  History  Chief Complaint Weakness    HPI Tina Sharp is a 54 y.o. female with hx of breast cancer, CVA (with some mild residual left hand weakness), alcohol use who presents for generalized weakness, concern for dehydration, and shakiness.   Patient states her "shakiness" started this afternoon/evening and is her primary complaint that is bothering her.  She does admit to drinking alcohol on a daily basis.  Typically drinks beer starting in the evening until she falls asleep.  She has not had anything to drink tonight.  Last drink was about 24 hours ago.  She denies any history of alcohol withdrawal seizures.  She has no desire at this time to stop drinking alcohol.  She feels dehydrated because her lips are dry and she has had decreased fluid intake today.  Reports feeling associated generalized weakness, which worried her due to her history of CVA, though today she denies any lateralizing weakness, no speech difficulties, no facial droop. Reports slight "difference" in sensation to left side face vs right, but denies true numbness.   She also reports tripping and falling onto her right knee yesterday, due to getting tripped up on her walker. Denies any other associated injuries.  Reports compliance with all of her medications including her warfarin for history of hyper homocystinemia.   Past Medical Hx Past Medical History:  Diagnosis Date  . Anxiety   . Breast cancer ([place]) 01/2014   Invasive lobular carcinoma, 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 ER/ PR+; Her 2 neu not overexpressed, microscopic positive margin (skin). Oncotype DX, low risk for recurrence.  . Depression   . Genetic screening 05/05/2014   negative /[facility]  .  GERD (gastroesophageal reflux disease)   . Hypertension   . 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 Alcohol withdrawal ([place]) 09/14/2018  . Fall 09/12/2018  . Lymphedema 04/30/2018  . Hypertension 01/14/2018  . Leg swelling 01/14/2018  . Breast calcifications on mammogram 06/24/2017  . Hip fracture ([place]) 07/30/2016  . 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 Rotator cuff tendinitis, left 06/15/2016  . Neuropathy 01/03/2016  . Ataxia 01/03/2016  . Myalgia 09/08/2015  . Depression 09/06/2015  . Cerebrovascular accident (CVA) due to thrombosis of posterior cerebral artery ([place]) 08/18/2015  . 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 Stroke with cerebral ischemia ([place]) 07/12/2015  . HLD (hyperlipidemia) 07/12/2015  . CVA (cerebral infarction) 06/21/2015  . TIA (transient ischemic attack) 06/21/2015  . Macrocytic anemia 06/20/2015  . Headache 06/14/2015  . Cigarette smoker 06/14/2015  . Stroke (cerebrum) ([place]) cryptogenic R brain infarcts s/p IV tPA  06/11/2015  . 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, estrogen receptor positive ([place]) 04/23/2014    Past Surgical Hx Past Surgical History:  Procedure Laterality Date  . ABDOMINAL HYSTERECTOMY  2000   left both ovaries in  . BREAST SURGERY Left 04/08/14   mastectomy  . COLONOSCOPY  06/30/2008   Lucilla Lame, MD; chronic diarrhea. Negative ileal and colon biopsies.   . EP IMPLANTABLE DEVICE N/A 06/14/2015   Procedure: Loop Recorder Insertion;  Surgeon: Evans Lance, MD;  Location: [place] [facility];  Service: Cardiovascular;  Laterality: N/A;  . HIP ARTHROPLASTY Right 08/01/2016   Procedure: ARTHROPLASTY BIPOLAR HIP (HEMIARTHROPLASTY);  Surgeon: Dereck Leep, MD;  Location: [facility] ORS;  Service: Orthopedics;  Laterality: Right;  . KYPHOPLASTY N/A 09/16/2018   Procedure: KYPHOPLASTY T12;  Surgeon:  Hessie Knows, MD;  Location: [facility] ORS;  Service: Orthopedics;  Laterality: N/A;  . LAPAROSCOPIC HYSTERECTOMY  2010   [facility]  . MASTECTOMY Left 2016  . REDUCTION MAMMAPLASTY Right 2016   Lift  . reduction mammoplasty Right 04/08/14   Dr Tula Nakayama  . TEE WITHOUT CARDIOVERSION N/A 06/14/2015   Procedure: TRANSESOPHAGEAL ECHOCARDIOGRAM (TEE);  Surgeon: Josue Hector, MD;  Location: [facility];  Service: Cardiovascular;  Laterality: N/A;  . TONSILLECTOMY AND ADENOIDECTOMY     pt was 54 years old    Medications Prior to Admission medications   Medication Sig Start Date End Date Taking? Authorizing Provider  anastrozole (ARIMIDEX) 1 MG tablet Take 1 tablet (1 mg total) by mouth every evening. 06/15/15   Cammie Sickle, MD  atorvastatin (LIPITOR) 40 MG tablet Take 1 tablet (40 mg total) by mouth daily at 6 PM. 11/09/15   Gladstone Lighter, MD  baclofen (LIORESAL) 10 MG tablet Take 10 mg by mouth 3 (three) times daily.    [provider]  clonazePAM (KLONOPIN) 0.5 MG tablet Take 1 tablet (0.5 mg total) by mouth 2 (two) times daily as needed for anxiety. 09/17/18   Ripley Fraise, PA  cyanocobalamin 1000 MCG tablet Take 1 tablet (1,000 mcg total) by mouth daily. 11/09/15   Gladstone Lighter, MD  DULoxetine (CYMBALTA) 60 MG capsule Take 60 mg by mouth daily. 07/21/16   [provider]  gabapentin (NEURONTIN) 600 MG tablet Take 600 mg by mouth 4 (four) times daily. 07/22/16   [provider]  HYDROcodone-acetaminophen (NORCO/VICODIN) 5-325 MG tablet Take 1 tablet by mouth every 12 (twelve) hours as needed for severe pain. 09/17/18   Max Sane, MD  ibuprofen (ADVIL,MOTRIN) 400 MG tablet Take 1 tablet (400 mg total) by mouth every 6 (six) hours as needed. 09/17/18   Max Sane, MD  omeprazole (PRILOSEC) 20 MG capsule Take 20 mg by mouth as needed (heart burn).     [provider]  oxyCODONE (ROXICODONE) 5 MG immediate release tablet Take 1 tablet (5 mg total) by  mouth every 8 (eight) hours as needed. 10/27/18 10/27/19  Rudene Re, MD  potassium chloride (K-DUR) 10 MEQ tablet Take 10 mEq by mouth daily. 08/20/18   [provider]  prochlorperazine (COMPAZINE) 5 MG tablet Take 5 mg by mouth every 6 (six) hours as needed for nausea or vomiting.    [provider]  torsemide (DEMADEX) 20 MG tablet Take 20 mg by mouth daily.  06/17/17 10/27/18  [provider]  warfarin (COUMADIN) 5 MG tablet Take 1 tablet (5 mg total) by mouth one time only at 6 PM for 1 dose. 09/17/18 10/27/18  Max Sane, MD    Allergies Patient has no known allergies.  Family Hx Family History  Problem Relation Age of Onset  . Breast cancer Mother 49  . Ovarian cancer Other   . Stroke Paternal Grandmother   . Breast cancer Maternal Grandmother 12  . Cancer Maternal Grandfather 70       prostate  . Prostate cancer Maternal Grandfather     Social Hx Social History   Tobacco Use  . Smoking status: Former Smoker    Packs/day: 0.25    Types: Cigarettes  Quit date: 10/12/2015    Years since quitting: 3.9  . Smokeless tobacco: Never Used  . Tobacco comment: E cig  Substance Use Topics  . Alcohol use: Not Currently    Alcohol/week: 3.0 standard drinks    Types: 1 Glasses of wine, 1 Cans of beer, 1 Shots of liquor per week    Comment: social  . Drug use: No     Review of Systems  Constitutional: Negative for fever, chills.  Positive for generalized weakness. Eyes: Negative for visual changes. ENT: Negative for sore throat. Cardiovascular: Negative for chest pain. Respiratory: Negative for shortness of breath. Gastrointestinal: Negative for nausea, vomiting.  Genitourinary: Negative for dysuria. Musculoskeletal: Negative for leg swelling. Skin: Negative for rash. Neurological: Negative for headaches.  Positive for tremors.   Physical Exam  Vital Signs: ED Triage Vitals [10/04/19 1921]  Enc Vitals Group     BP (!) 161/104     Pulse  Rate (!) 108     Resp 18     Temp 98.6 F (37 C)     Temp src      SpO2 96 %     Weight 150 lb (68 kg)     Height      Head Circumference      Peak Flow      Pain Score 0     Pain Loc      Pain Edu?      Excl. in [place]?     Constitutional: Alert and oriented.  Head: Normocephalic. Atraumatic.  Face symmetric.  No droop. Eyes: Conjunctivae clear. Sclera anicteric. Nose: No congestion. No rhinorrhea. Mouth/Throat: Wearing mask.  Mucous membranes dry. Neck: No stridor.   Cardiovascular: Tachycardic, extremities well perfused. Respiratory: Normal respiratory effort.  Lungs CTAB. Gastrointestinal: Soft. Non-tender. Non-distended.  Musculoskeletal: Ecchymosis and mild swelling about the anterior right knee 2/2 fall yesterday. Neurologic:  Normal speech and language. No gross focal neurologic deficits are appreciated. Alert and oriented.  Face symmetric.  Tongue midline.  Cranial nerves II through XII intact. UE and LE strength 5/5 and symmetric aside from some very subtle weakness of left hand grip (4+/5) compared to right. UE and LE SILT. Tremulous.  Skin: Skin is warm, dry and intact. No rash noted. Psychiatric: Slightly anxious.   EKG  Personally reviewed.   Rate: 107 Rhythm: sinus Axis: borderline leftwad Intervals: WNL No STEMI    Radiology  XR knee IMPRESSION:  1. No acute bony abnormality.  CT head IMPRESSION:  No acute CT finding. Old infarctions in the right middle cerebral  artery territory affecting the right frontoparietal vertex and right  parietal brain.    Procedures  Procedure(s) performed (including critical care):  Procedures   Initial Impression / Assessment and Plan / ED Course  54 y.o. female who presents to the ED for generalized weakness, shakiness, concerns for dehydration  Exam and presentation seems most consistent with alcohol withdrawal.  We will plan to treat with fluids, IV Ativan and reassess.  Differential also includes  dehydration, electrolyte abnormality, AKI.  Do not suspect PE as etiology of her tachycardia given she is compliant on her warfarin.  CT head negative for acute findings. Labs revealed mild hyponatremia, hypochloremia consistent with decreased fluid intake today.  AST & ALT consistent with chronic alcohol use.  Mildly elevated anion gap, likely in the setting of dehydration and perhaps some mild alcoholic ketoacidosis.  Receiving IV fluids, thiamine and folate.  Remainder of labs demonstrate INR within therapeutic range.  EKG without acute ischemic changes. Troponin  negative.  Discussed with patient that her symptoms are likely related to her alcohol use.  At this time she does not desire alcohol cessation.  As such, after completion of fluids, pending reassessment and improvement in her heart rate/withdrawal symptoms will plan for discharge with supportive care, outpatient follow-up.   Final Clinical Impression(s) / ED Diagnosis  Final diagnoses:  Shakiness  Alcohol use  Generalized weakness  Fall, initial encounter  Alcohol withdrawal syndrome without complication ([place])       Note:  This document was prepared using Dragon voice recognition software and may include unintentional dictation errors.   Lilia Pro., MD 10/05/19 [phone]

## 2019-10-04 NOTE — ED Notes (Signed)
This RN on phone w/ pt's daughter, Jinny Blossom.

## 2019-10-04 NOTE — ED Notes (Signed)
This RN at bedside with EDT to move pt up in bed.

## 2019-10-04 NOTE — ED Notes (Signed)
This RN introduced self to pt. Pt with notable shaking to upper extremities. Pt states this shaking is new today. Pt stating she feels weak all over. Pt stating hx of stroke. Pt tachycardic. Pt oxygen sats 93%, pt placed on 2L [place] with improvement to 96%. Marland Kitchen

## 2019-10-05 DIAGNOSIS — F10231 Alcohol dependence with withdrawal delirium: Secondary | ICD-10-CM | POA: Diagnosis present

## 2019-10-05 DIAGNOSIS — Z20822 Contact with and (suspected) exposure to covid-19: Secondary | ICD-10-CM | POA: Diagnosis present

## 2019-10-05 DIAGNOSIS — F329 Major depressive disorder, single episode, unspecified: Secondary | ICD-10-CM | POA: Diagnosis present

## 2019-10-05 DIAGNOSIS — E871 Hypo-osmolality and hyponatremia: Secondary | ICD-10-CM

## 2019-10-05 DIAGNOSIS — Z853 Personal history of malignant neoplasm of breast: Secondary | ICD-10-CM | POA: Diagnosis not present

## 2019-10-05 DIAGNOSIS — W010XXA Fall on same level from slipping, tripping and stumbling without subsequent striking against object, initial encounter: Secondary | ICD-10-CM | POA: Diagnosis present

## 2019-10-05 DIAGNOSIS — Z7901 Long term (current) use of anticoagulants: Secondary | ICD-10-CM

## 2019-10-05 DIAGNOSIS — Z87891 Personal history of nicotine dependence: Secondary | ICD-10-CM | POA: Diagnosis not present

## 2019-10-05 DIAGNOSIS — Z9012 Acquired absence of left breast and nipple: Secondary | ICD-10-CM | POA: Diagnosis not present

## 2019-10-05 DIAGNOSIS — E7211 Homocystinuria: Secondary | ICD-10-CM | POA: Diagnosis present

## 2019-10-05 DIAGNOSIS — I69354 Hemiplegia and hemiparesis following cerebral infarction affecting left non-dominant side: Secondary | ICD-10-CM | POA: Diagnosis not present

## 2019-10-05 DIAGNOSIS — I10 Essential (primary) hypertension: Secondary | ICD-10-CM | POA: Diagnosis present

## 2019-10-05 DIAGNOSIS — K709 Alcoholic liver disease, unspecified: Secondary | ICD-10-CM | POA: Diagnosis present

## 2019-10-05 DIAGNOSIS — E860 Dehydration: Secondary | ICD-10-CM | POA: Diagnosis present

## 2019-10-05 DIAGNOSIS — Z803 Family history of malignant neoplasm of breast: Secondary | ICD-10-CM | POA: Diagnosis not present

## 2019-10-05 DIAGNOSIS — K219 Gastro-esophageal reflux disease without esophagitis: Secondary | ICD-10-CM | POA: Diagnosis present

## 2019-10-05 DIAGNOSIS — G629 Polyneuropathy, unspecified: Secondary | ICD-10-CM | POA: Diagnosis present

## 2019-10-05 DIAGNOSIS — I6930 Unspecified sequelae of cerebral infarction: Secondary | ICD-10-CM

## 2019-10-05 DIAGNOSIS — Z79899 Other long term (current) drug therapy: Secondary | ICD-10-CM | POA: Diagnosis not present

## 2019-10-05 DIAGNOSIS — Z79891 Long term (current) use of opiate analgesic: Secondary | ICD-10-CM | POA: Diagnosis not present

## 2019-10-05 DIAGNOSIS — F10230 Alcohol dependence with withdrawal, uncomplicated: Secondary | ICD-10-CM | POA: Diagnosis not present

## 2019-10-05 DIAGNOSIS — Z823 Family history of stroke: Secondary | ICD-10-CM | POA: Diagnosis not present

## 2019-10-05 DIAGNOSIS — Y901 Blood alcohol level of 20-39 mg/100 ml: Secondary | ICD-10-CM | POA: Diagnosis present

## 2019-10-05 DIAGNOSIS — R251 Tremor, unspecified: Secondary | ICD-10-CM | POA: Diagnosis present

## 2019-10-05 DIAGNOSIS — F419 Anxiety disorder, unspecified: Secondary | ICD-10-CM | POA: Diagnosis present

## 2019-10-05 DIAGNOSIS — E785 Hyperlipidemia, unspecified: Secondary | ICD-10-CM | POA: Diagnosis present

## 2019-10-05 LAB — COMPREHENSIVE METABOLIC PANEL
ALT: 65 U/L — ABNORMAL HIGH (ref 0–44)
AST: 103 U/L — ABNORMAL HIGH (ref 15–41)
Albumin: 3.6 g/dL (ref 3.5–5.0)
Alkaline Phosphatase: 78 U/L (ref 38–126)
Anion gap: 12 (ref 5–15)
BUN: 7 mg/dL (ref 6–20)
CO2: 26 mmol/L (ref 22–32)
Calcium: 7.7 mg/dL — ABNORMAL LOW (ref 8.9–10.3)
Chloride: 97 mmol/L — ABNORMAL LOW (ref 98–111)
Creatinine, Ser: 0.69 mg/dL (ref 0.44–1.00)
GFR calc Af Amer: >60 mL/min (ref >60)
GFR calc non Af Amer: >60 mL/min (ref >60)
Glucose, Bld: 86 mg/dL (ref 70–99)
Potassium: 3.8 mmol/L (ref 3.5–5.1)
Sodium: 135 mmol/L (ref 135–145)
Total Bilirubin: 1.6 mg/dL — ABNORMAL HIGH (ref 0.3–1.2)
Total Protein: 6.6 g/dL (ref 6.5–8.1)

## 2019-10-05 LAB — URINALYSIS, COMPLETE (UACMP) WITH MICROSCOPIC
Bilirubin Urine: NEGATIVE
Glucose, UA: NEGATIVE mg/dL
Ketones, ur: 20 mg/dL — AB
Leukocytes,Ua: NEGATIVE
Nitrite: NEGATIVE
Protein, ur: 100 mg/dL — AB
Specific Gravity, Urine: 1.01 (ref 1.005–1.030)
pH: 6 (ref 5.0–8.0)

## 2019-10-05 LAB — PROTIME-INR
INR: 2.3 — ABNORMAL HIGH (ref 0.8–1.2)
Prothrombin Time: 25 seconds — ABNORMAL HIGH (ref 11.4–15.2)

## 2019-10-05 LAB — PREGNANCY, URINE: Preg Test, Ur: NEGATIVE

## 2019-10-05 LAB — RESPIRATORY PANEL BY RT PCR (FLU A&B, COVID)
Influenza A by PCR: NEGATIVE
Influenza B by PCR: NEGATIVE
SARS Coronavirus 2 by RT PCR: NEGATIVE

## 2019-10-05 LAB — CBC
HCT: 37.6 % (ref 36.0–46.0)
Hemoglobin: 13.1 g/dL (ref 12.0–15.0)
MCH: 33.1 pg (ref 26.0–34.0)
MCHC: 34.8 g/dL (ref 30.0–36.0)
MCV: 94.9 fL (ref 80.0–100.0)
Platelets: 204 10*3/uL (ref 150–400)
RBC: 3.96 MIL/uL (ref 3.87–5.11)
RDW: 13.3 % (ref 11.5–15.5)
WBC: 5.8 10*3/uL (ref 4.0–10.5)
nRBC: 0 % (ref 0.0–0.2)

## 2019-10-05 LAB — MAGNESIUM: Magnesium: 1.9 mg/dL (ref 1.7–2.4)

## 2019-10-05 LAB — PHOSPHORUS: Phosphorus: 2.4 mg/dL — ABNORMAL LOW (ref 2.5–4.6)

## 2019-10-05 MED ORDER — LORAZEPAM 1 MG PO TABS
1.0000 mg | ORAL_TABLET | ORAL | Status: AC | PRN
  Administered 2019-10-05 – 2019-10-07 (×3): 2 mg via ORAL

## 2019-10-05 MED ORDER — ENOXAPARIN SODIUM 40 MG/0.4ML ~~LOC~~ SOLN: 40.0000 mg | SUBCUTANEOUS | Status: DC

## 2019-10-05 MED ORDER — ADULT MULTIVITAMIN W/MINERALS CH: 1.0000 | ORAL_TABLET | Freq: Every day | ORAL | Status: DC

## 2019-10-05 MED ORDER — DEXTROSE-NACL 5-0.9 % IV SOLN: INTRAVENOUS | Status: AC

## 2019-10-05 MED ORDER — THIAMINE HCL 100 MG PO TABS: 100.0000 mg | ORAL_TABLET | Freq: Every day | ORAL | Status: DC

## 2019-10-05 MED ORDER — LORAZEPAM 2 MG/ML IJ SOLN: 0.0000 mg | Freq: Two times a day (BID) | INTRAMUSCULAR | Status: AC

## 2019-10-05 MED ORDER — LORAZEPAM 2 MG/ML IJ SOLN
0.0000 mg | INTRAMUSCULAR | Status: AC
  Administered 2019-10-05: 10:00:00 1 mg via INTRAVENOUS
  Administered 2019-10-05 – 2019-10-06 (×4): 2 mg via INTRAVENOUS
  Filled 2019-10-05 (×5): qty 1

## 2019-10-05 MED ORDER — ADULT MULTIVITAMIN W/MINERALS CH
1.0000 | ORAL_TABLET | Freq: Every day | ORAL | Status: DC
  Administered 2019-10-05 – 2019-10-09 (×5): 1 via ORAL
  Filled 2019-10-05 (×5): qty 1

## 2019-10-05 MED ORDER — FOLIC ACID 1 MG PO TABS
1.0000 mg | ORAL_TABLET | Freq: Every day | ORAL | Status: DC
  Administered 2019-10-05 – 2019-10-09 (×5): 1 mg via ORAL
  Filled 2019-10-05 (×5): qty 1

## 2019-10-05 MED ORDER — LORAZEPAM 2 MG PO TABS
0.0000 mg | ORAL_TABLET | Freq: Four times a day (QID) | ORAL | Status: DC
  Administered 2019-10-05 (×2): 2 mg via ORAL
  Administered 2019-10-05: 02:00:00 4 mg via ORAL
  Filled 2019-10-05 (×4): qty 1
  Filled 2019-10-05: qty 2

## 2019-10-05 MED ORDER — THIAMINE HCL 100 MG/ML IJ SOLN: 100.0000 mg | Freq: Every day | INTRAMUSCULAR | Status: DC

## 2019-10-05 MED ORDER — THIAMINE HCL 100 MG/ML IJ SOLN
100.0000 mg | Freq: Every day | INTRAMUSCULAR | Status: DC
  Filled 2019-10-05 (×2): qty 2

## 2019-10-05 MED ORDER — WARFARIN SODIUM 2 MG PO TABS
2.0000 mg | ORAL_TABLET | Freq: Once | ORAL | Status: AC
  Administered 2019-10-05: 2 mg via ORAL
  Filled 2019-10-05: qty 1

## 2019-10-05 MED ORDER — THIAMINE HCL 100 MG PO TABS
100.0000 mg | ORAL_TABLET | Freq: Every day | ORAL | Status: DC
  Administered 2019-10-05 – 2019-10-09 (×5): 100 mg via ORAL
  Filled 2019-10-05 (×7): qty 1

## 2019-10-05 MED ORDER — ONDANSETRON HCL 4 MG/2ML IJ SOLN: 4.0000 mg | Freq: Four times a day (QID) | INTRAMUSCULAR | Status: DC | PRN

## 2019-10-05 MED ORDER — ACETAMINOPHEN 650 MG RE SUPP: 650.0000 mg | Freq: Four times a day (QID) | RECTAL | Status: DC | PRN

## 2019-10-05 MED ORDER — LORAZEPAM 2 MG/ML IJ SOLN
1.0000 mg | INTRAMUSCULAR | Status: AC | PRN
  Administered 2019-10-07: 2 mg via INTRAVENOUS
  Filled 2019-10-05: qty 1

## 2019-10-05 MED ORDER — WARFARIN - PHARMACIST DOSING INPATIENT: Freq: Every day | Status: DC

## 2019-10-05 MED ORDER — LORAZEPAM 2 MG/ML IJ SOLN
1.0000 mg | Freq: Once | INTRAMUSCULAR | Status: AC
  Administered 2019-10-05: 01:00:00 1 mg via INTRAVENOUS
  Filled 2019-10-05: qty 1

## 2019-10-05 MED ORDER — ACETAMINOPHEN 325 MG PO TABS
650.0000 mg | ORAL_TABLET | Freq: Four times a day (QID) | ORAL | Status: DC | PRN
  Administered 2019-10-08: 10:00:00 650 mg via ORAL
  Filled 2019-10-05 (×2): qty 2

## 2019-10-05 MED ORDER — ENSURE ENLIVE PO LIQD
237.0000 mL | Freq: Two times a day (BID) | ORAL | Status: DC
  Administered 2019-10-05 – 2019-10-09 (×6): 237 mL via ORAL

## 2019-10-05 MED ORDER — SENNOSIDES-DOCUSATE SODIUM 8.6-50 MG PO TABS: 1.0000 | ORAL_TABLET | Freq: Every evening | ORAL | Status: DC | PRN

## 2019-10-05 MED ORDER — LORAZEPAM 2 MG PO TABS
0.0000 mg | ORAL_TABLET | Freq: Two times a day (BID) | ORAL | Status: AC
  Filled 2019-10-05 (×2): qty 1

## 2019-10-05 MED ORDER — ONDANSETRON HCL 4 MG PO TABS: 4.0000 mg | ORAL_TABLET | Freq: Four times a day (QID) | ORAL | Status: DC | PRN

## 2019-10-05 MED ORDER — FOLIC ACID 1 MG PO TABS: 1.0000 mg | ORAL_TABLET | Freq: Every day | ORAL | Status: DC

## 2019-10-05 MED ORDER — LORAZEPAM 2 MG/ML IJ SOLN: 0.0000 mg | Freq: Four times a day (QID) | INTRAMUSCULAR | Status: DC

## 2019-10-05 MED ORDER — LORAZEPAM 2 MG/ML IJ SOLN
0.0000 mg | Freq: Three times a day (TID) | INTRAMUSCULAR | Status: AC
  Administered 2019-10-07: 2 mg via INTRAVENOUS
  Filled 2019-10-05: qty 1

## 2019-10-05 NOTE — Progress Notes (Signed)
Rufina Falco NP notified of MEWS assessment.

## 2019-10-05 NOTE — H&P (Signed)
History and Physical    Tina Sharp [id number] DOB: 1965/12/03 DOA: 10/04/2019  PCP: Kirk Ruths, MD   Patient coming from: home I have personally briefly reviewed patient's old medical records in [place]  Chief Complaint: weakness and shakiness  HPI: Tina Sharp is a 54 y.o. 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  Patient admits to drinking alcohol heavily every day.  Most of the history is taken from the ER got Ativan in the ER and says she feels like she cannot think clearly.  She is able to deny headache or blurred vision.  Denies new numbness weakness or tingling on one side of the face or extremities.  Denies cough fever chills, chest pain, GI or GU symptoms.  ED Course: On arrival in the emergency room she was hypertensive with BP 161/104, tacky at 102, RR 21, O2 sat 93% on room air and afebrile.  Work-up was significant for hyponatremia of 131, elevated AST/ALT of 119/81, elevated bilirubin 1.3.  EtOH was 36.  Bicarb was normal at 22 but anion gap elevated at 19.  INR 2.0.  Urinalysis showed positive ketones blood work was otherwise unremarkable.  Head CT showed no acute but did show old right MCA territory infarct.  EKG sinus tachycardia.  Patient was treated in the emergency room with Ativan suspected acute alcohol withdrawal.  Hospitalist consulted for admission.  Review of Systems: As per HPI otherwise 10 point review of systems negative.    Past Medical History:  Diagnosis Date  . Anxiety   . Breast cancer ([place]) 01/2014   Invasive lobular carcinoma, 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 ER/ PR+; Her 2 neu not overexpressed, microscopic  positive margin (skin). Oncotype DX, low risk for recurrence.  . Depression   . Genetic screening 05/05/2014   negative /[facility]  . GERD (gastroesophageal reflux disease)   . Hypertension   . 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 ABDOMINAL HYSTERECTOMY  2000   left both ovaries in  . BREAST SURGERY Left 04/08/14   mastectomy  . COLONOSCOPY  06/30/2008   Lucilla Lame, MD; chronic diarrhea. Negative ileal and colon biopsies.   . EP IMPLANTABLE DEVICE N/A 06/14/2015   Procedure: Loop Recorder Insertion;  Surgeon: Evans Lance, MD;  Location: [place] [facility];  Service: Cardiovascular;  Laterality: N/A;  . HIP ARTHROPLASTY Right 08/01/2016   Procedure: ARTHROPLASTY BIPOLAR HIP (HEMIARTHROPLASTY);  Surgeon: Dereck Leep, MD;  Location: [facility] ORS;  Service: Orthopedics;  Laterality: Right;  . KYPHOPLASTY N/A 09/16/2018   Procedure: KYPHOPLASTY T12;  Surgeon: Hessie Knows, MD;  Location: [facility] ORS;  Service: Orthopedics;  Laterality: N/A;  . LAPAROSCOPIC HYSTERECTOMY  2010   [facility]  . MASTECTOMY Left 2016  . REDUCTION MAMMAPLASTY Right 2016   Lift  . reduction mammoplasty Right 04/08/14   Dr Tula Nakayama  . TEE WITHOUT CARDIOVERSION N/A 06/14/2015   Procedure: TRANSESOPHAGEAL ECHOCARDIOGRAM (TEE);  Surgeon: Josue Hector, MD;  [facility];  Service: Cardiovascular;  Laterality: N/A;  . TONSILLECTOMY AND ADENOIDECTOMY     pt was 54 years old     reports that  she quit smoking about 3 years ago. Her smoking use included cigarettes. She smoked 0.25 packs per day. She has never used smokeless tobacco. She reports previous alcohol use of about 3.0 standard drinks of alcohol per week. She reports that she does not use drugs.  No Known Allergies  Family History  Problem Relation Age of Onset  . Breast cancer Mother 54  . Ovarian cancer Other   . Stroke Paternal Grandmother   .  Breast cancer Maternal Grandmother 39  . Cancer Maternal Grandfather 49       prostate  . Prostate cancer Maternal Grandfather      Prior to Admission medications   Medication Sig Start Date End Date Taking? Authorizing Provider  anastrozole (ARIMIDEX) 1 MG tablet Take 1 tablet (1 mg total) by mouth every evening. 06/15/15   Cammie Sickle, MD  atorvastatin (LIPITOR) 40 MG tablet Take 1 tablet (40 mg total) by mouth daily at 6 PM. 11/09/15   Gladstone Lighter, MD  baclofen (LIORESAL) 10 MG tablet Take 10 mg by mouth 3 (three) times daily.    [provider]  clonazePAM (KLONOPIN) 0.5 MG tablet Take 1 tablet (0.5 mg total) by mouth 2 (two) times daily as needed for anxiety. 09/17/18   Ripley Fraise, PA  cyanocobalamin 1000 MCG tablet Take 1 tablet (1,000 mcg total) by mouth daily. 11/09/15   Gladstone Lighter, MD  DULoxetine (CYMBALTA) 60 MG capsule Take 60 mg by mouth daily. 07/21/16   [provider]  gabapentin (NEURONTIN) 600 MG tablet Take 600 mg by mouth 4 (four) times daily. 07/22/16   [provider]  HYDROcodone-acetaminophen (NORCO/VICODIN) 5-325 MG tablet Take 1 tablet by mouth every 12 (twelve) hours as needed for severe pain. 09/17/18   Max Sane, MD  ibuprofen (ADVIL,MOTRIN) 400 MG tablet Take 1 tablet (400 mg total) by mouth every 6 (six) hours as needed. 09/17/18   Max Sane, MD  omeprazole (PRILOSEC) 20 MG capsule Take 20 mg by mouth as needed (heart burn).     [provider]  oxyCODONE (ROXICODONE) 5 MG immediate release tablet Take 1 tablet (5 mg total) by mouth every 8 (eight) hours as needed. 10/27/18 10/27/19  Rudene Re, MD  potassium chloride (K-DUR) 10 MEQ tablet Take 10 mEq by mouth daily. 08/20/18   [provider]  prochlorperazine (COMPAZINE) 5 MG tablet Take 5 mg by mouth every 6 (six) hours as needed for nausea or vomiting.    [provider]  torsemide (DEMADEX) 20 MG tablet Take 20 mg by mouth daily.   06/17/17 10/27/18  [provider]  warfarin (COUMADIN) 5 MG tablet Take 1 tablet (5 mg total) by mouth one time only at 6 PM for 1 dose. 09/17/18 10/27/18  Max Sane, MD    Physical Exam: Vitals:   10/05/19 0015 10/05/19 0100 10/05/19 0205 10/05/19 0206  BP: (!) 194/80 (!) 150/57 127/90   Pulse: (!) 140 (!) 124 (!) 121 (!) 111  Resp:  19 17   Temp:      SpO2:  97% 99%   Weight:         Vitals:   10/05/19 0015 10/05/19 0100 10/05/19 0205 10/05/19 0206  BP: (!) 194/80 (!) 150/57 127/90   Pulse: (!) 140 (!) 124 (!) 121 (!) 111  Resp:  19 17   Temp:      SpO2:  97% 99%   Weight:        Constitutional: Patient appears tremulous and anxious  eyes: PERRL, lids and conjunctivae normal ENMT: Mucous membranes are moist.  Neck: normal, supple, no masses, no thyromegaly Respiratory: clear to auscultation bilaterally, no wheezing, no crackles. Normal respiratory effort. No accessory muscle use.  Cardiovascular: Regular rate and rhythm, no murmurs / rubs / gallops. No extremity edema. 2+ pedal pulses. No carotid bruits.  Abdomen: no tenderness, no masses palpated. No hepatosplenomegaly. Bowel sounds positive.  Musculoskeletal: no clubbing / cyanosis. No joint deformity upper and lower extremities.  Skin: no rashes, lesions, ulcers.  Neurologic: CN II -XII with no gross deficit. Tremor of upper extremities. Gross exam with weakness left upper extremity Psychiatric: appears anxious   Labs on Admission: I have personally reviewed following labs and imaging studies  CBC: Recent Labs  Lab 10/04/19 1929  WBC 7.0  HGB 15.3*  HCT 44.8  MCV 95.7  PLT [id number]   Basic Metabolic Panel: Recent Labs  Lab 10/04/19 1929  NA 131*  K 4.1  CL 90*  CO2 22  GLUCOSE 88  BUN 7  CREATININE 0.64  CALCIUM 8.6*   GFR: CrCl cannot be calculated (Unknown ideal weight.). Liver Function Tests: Recent Labs  Lab 10/04/19 1929  AST 119*  ALT 81*  ALKPHOS 95  BILITOT 1.3*  PROT 7.9   ALBUMIN 4.6   No results for input(s): LIPASE, AMYLASE in the last 168 hours. No results for input(s): AMMONIA in the last 168 hours. Coagulation Profile: Recent Labs  Lab 10/04/19 2242  INR 2.0*   Cardiac Enzymes: No results for input(s): CKTOTAL, CKMB, CKMBINDEX, TROPONINI in the last 168 hours. BNP (last 3 results) No results for input(s): PROBNP in the last 8760 hours. HbA1C: No results for input(s): HGBA1C in the last 72 hours. CBG: No results for input(s): GLUCAP in the last 168 hours. Lipid Profile: No results for input(s): CHOL, HDL, LDLCALC, TRIG, CHOLHDL, LDLDIRECT in the last 72 hours. Thyroid Function Tests: No results for input(s): TSH, T4TOTAL, FREET4, T3FREE, THYROIDAB in the last 72 hours. Anemia Panel: No results for input(s): VITAMINB12, FOLATE, FERRITIN, TIBC, IRON, RETICCTPCT in the last 72 hours. Urine analysis:    Component Value Date/Time   COLORURINE YELLOW (A) 10/05/2019 0027   APPEARANCEUR HAZY (A) 10/05/2019 0027   APPEARANCEUR CLEAR 04/08/2014 0848   LABSPEC 1.010 10/05/2019 0027   LABSPEC 1.020 04/08/2014 0848   PHURINE 6.0 10/05/2019 0027   GLUCOSEU NEGATIVE 10/05/2019 0027   GLUCOSEU NEGATIVE 04/08/2014 0848   HGBUR SMALL (A) 10/05/2019 0027   BILIRUBINUR NEGATIVE 10/05/2019 0027   BILIRUBINUR 2+ 04/08/2014 0848   KETONESUR 20 (A) 10/05/2019 0027   PROTEINUR 100 (A) 10/05/2019 0027   UROBILINOGEN 1.0 06/21/2015 1115   NITRITE NEGATIVE 10/05/2019 0027   LEUKOCYTESUR NEGATIVE 10/05/2019 0027   LEUKOCYTESUR NEGATIVE 04/08/2014 0848    Radiological Exams on Admission: CT Head Wo Contrast  Result Date: 10/04/2019 CLINICAL DATA:  Generalized weakness. Left facial numbness. Symptoms worsening throughout the day. EXAM: CT HEAD WITHOUT CONTRAST TECHNIQUE: Contiguous axial images were obtained from the base of the skull through the vertex without intravenous contrast. COMPARISON:  10/27/2018 FINDINGS: Brain: No abnormality seen affecting the  brainstem or cerebellum. Left cerebral hemisphere shows mild generalized volume loss without focal insult. There are old infarctions in the right parietal and frontoparietal junction regions. No evidence of recent infarction, mass lesion, hemorrhage, hydrocephalus or extra-axial collection Vascular: There is atherosclerotic calcification of the major vessels at the base of the brain. Skull: Normal Sinuses/Orbits: Clear except for a single opacified ethmoid air cell  on the right. Orbits negative. Other: None IMPRESSION: No acute CT finding. Old infarctions in the right middle cerebral artery territory affecting the right frontoparietal vertex and right parietal brain. Electronically Signed   By: Nelson Chimes M.D.   On: 10/04/2019 23:30   DG Knee Complete 4 Views Right  Result Date: 10/04/2019 CLINICAL DATA:  Weakness and pain, fell 3 days ago EXAM: RIGHT KNEE - COMPLETE 4+ VIEW COMPARISON:  None. FINDINGS: Frontal, bilateral oblique, lateral views of the right knee demonstrate no fractures. Alignment is anatomic. There is mild medial compartmental joint space narrowing. No joint effusion. IMPRESSION: 1. No acute bony abnormality. Electronically Signed   By: Randa Ngo M.D.   On: 10/04/2019 19:58    EKG: Independently reviewed.   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    Depression  -Currently takes bupropion, clonazepam, duloxetine    Neuropathy -Continue gabapentin once reconciled.  Also takes cyclobenzaprine and cyanocobalamin daily    History of breast cancer and is post left mastectomy -Continue anastrozole once med rec completed   DVT prophylaxis: on coumadin  Code Status: full code  Family Communication: none  Disposition Plan: Back to previous home environment Consults called: none     Athena Masse MD [facility]     10/05/2019, 2:22 AM

## 2019-10-05 NOTE — Discharge Instructions (Signed)
The best thing you can do for your health right now is to decrease the amount of alcohol you drink.   If or when you are ready for this, information is below for a clinic that can help you. Please reach out to them anytime you need.   Please return to the ER for any new or worsening symptoms.

## 2019-10-05 NOTE — Progress Notes (Signed)
PROGRESS NOTE    RENNEE Sharp  [id number] DOB: 1966/03/20 DOA: 10/04/2019 PCP: Kirk Ruths, MD    Brief Narrative:  Tina Sharp is a 54 y.o. 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 Saw pt this am. Opens eyes but falls right back to sleep. Was given ativan earlier in the morning  Objective: Vitals:   10/05/19 0654 10/05/19 0845 10/05/19 1342 10/05/19 1656  BP: (!) 150/85 (!) 145/89 135/82 135/80  Pulse: (!) 115 (!) 114 (!) 105 (!) 112  Resp: 15   18  Temp: 98.7 F (37.1 C) 98.8 F (37.1 C) 98.2 F (36.8 C) 98.2 F (36.8 C)  TempSrc: Oral Oral Oral Oral  SpO2: 97% 99% 98% 99%  Weight:      Height:        Intake/Output Summary (Last 24 hours) at 10/05/2019 1726 Last data filed at 10/05/2019 1536 Gross per 24 hour  Intake 1428.19 ml  Output --  Net 1428.19 ml   Filed Weights   10/04/19 1921 10/05/19 0342  Weight: 68 kg 68 kg    Examination:  General exam: Appears calm and comfortable , sleepy.opens eyes but falls back to sleep, not communicative Respiratory system: Clear to auscultation anteriorly with poor respiratory effort. . Cardiovascular system: S1 & S2 heard, RRR. No JVD, murmurs, rubs, gallops or clicks.  Gastrointestinal system: Abdomen is nondistended, soft and nontender.. Normal bowel sounds heard. Central nervous system: Falls asleep noninteractive with physical exam difficult to assess  extremities: No edema Skin: Warm dry Psychiatry: Unable to  assess    Data Reviewed: I have personally reviewed following labs and imaging studies  CBC: Recent Labs  Lab 10/04/19 1929 10/05/19 0406  WBC 7.0 5.8  HGB 15.3* 13.1  HCT 44.8 37.6  MCV 95.7 94.9  PLT 269 0000000   Basic Metabolic Panel: Recent Labs  Lab 10/04/19 1929 10/05/19 0406  NA 131* 135  K 4.1 3.8  CL 90* 97*  CO2 22 26  GLUCOSE 88 86  BUN 7 7  CREATININE 0.64 0.69  CALCIUM 8.6* 7.7*  MG  --  1.9  PHOS  --  2.4*   GFR: Estimated Creatinine Clearance: 71.9 mL/min (by C-G formula based on SCr of 0.69 mg/dL). Liver Function Tests: Recent Labs  Lab 10/04/19 1929 10/05/19 0406  AST 119* 103*  ALT 81* 65*  ALKPHOS 95 78  BILITOT 1.3* 1.6*  PROT 7.9 6.6  ALBUMIN 4.6 3.6   No results for input(s): LIPASE, AMYLASE in the last 168 hours. No results for input(s): AMMONIA in the last 168 hours. Coagulation Profile: Recent Labs  Lab 10/04/19 2242 10/05/19 0406  INR 2.0* 2.3*   Cardiac Enzymes: No results for input(s): CKTOTAL, CKMB, CKMBINDEX, TROPONINI in the last 168 hours. BNP (last 3 results) No results for input(s): PROBNP in the last 8760 hours. HbA1C: No results for input(s): HGBA1C in the last 72 hours. CBG: No results for input(s): GLUCAP in the last 168 hours. Lipid Profile: No results for input(s): CHOL, HDL, LDLCALC, TRIG, CHOLHDL, LDLDIRECT in the last  72 hours. Thyroid Function Tests: No results for input(s): TSH, T4TOTAL, FREET4, T3FREE, THYROIDAB in the last 72 hours. Anemia Panel: No results for input(s): VITAMINB12, FOLATE, FERRITIN, TIBC, IRON, RETICCTPCT in the last 72 hours. Sepsis Labs: No results for input(s): PROCALCITON, LATICACIDVEN in the last 168 hours.  Recent Results (from the past 240 hour(s))  Respiratory Panel by RT PCR (Flu A&B, Covid) - Nasopharyngeal Swab     Status: None   Collection Time: 10/05/19  2:25 AM   Specimen: Nasopharyngeal Swab  Result Value Ref Range Status   SARS Coronavirus 2 by RT PCR NEGATIVE  NEGATIVE Final    Comment: (NOTE) SARS-CoV-2 target nucleic acids are NOT DETECTED. The SARS-CoV-2 RNA is generally detectable in upper respiratoy specimens during the acute phase of infection. The lowest concentration of SARS-CoV-2 viral copies this assay can detect is 131 copies/mL. A negative result does not preclude SARS-Cov-2 infection and should not be used as the sole basis for treatment or other patient management decisions. A negative result may occur with  improper specimen collection/handling, submission of specimen other than nasopharyngeal swab, presence of viral mutation(s) within the areas targeted by this assay, and inadequate number of viral copies (<131 copies/mL). A negative result must be combined with clinical observations, patient history, and epidemiological information. The expected result is Negative. Fact Sheet for Patients:  PinkCheek.be Fact Sheet for Healthcare Providers:  GravelBags.it This test is not yet ap proved or cleared by the [place] FDA and  has been authorized for detection and/or diagnosis of SARS-CoV-2 by FDA under an Emergency Use Authorization (EUA). This EUA will remain  in effect (meaning this test can be used) for the duration of the COVID-19 declaration under Section 564(b)(1) of the Act, 21 U.S.C. section 360bbb-3(b)(1), unless the authorization is terminated or revoked sooner.    Influenza A by PCR NEGATIVE NEGATIVE Final   Influenza B by PCR NEGATIVE NEGATIVE Final    Comment: (NOTE) The Xpert Xpress SARS-CoV-2/FLU/RSV assay is intended as an aid in  the diagnosis of influenza from Nasopharyngeal swab specimens and  should not be used as a sole basis for treatment. Nasal washings and  aspirates are unacceptable for Xpert Xpress SARS-CoV-2/FLU/RSV  testing. Fact Sheet for Patients: PinkCheek.be Fact Sheet for Healthcare  Providers: GravelBags.it This test is not yet approved or cleared by the [place] FDA and  has been authorized for detection and/or diagnosis of SARS-CoV-2 by  FDA under an Emergency Use Authorization (EUA). This EUA will remain  in effect (meaning this test can be used) for the duration of the  Covid-19 declaration under Section 564(b)(1) of the Act, 21  U.S.C. section 360bbb-3(b)(1), unless the authorization is  terminated or revoked. Performed at [facility], [address] [place], [place] [address]          Radiology Studies: CT Head Wo Contrast  Result Date: 10/04/2019 CLINICAL DATA:  Generalized weakness. Left facial numbness. Symptoms worsening throughout the day. EXAM: CT HEAD WITHOUT CONTRAST TECHNIQUE: Contiguous axial images were obtained from the base of the skull through the vertex without intravenous contrast. COMPARISON:  10/27/2018 FINDINGS: Brain: No abnormality seen affecting the brainstem or cerebellum. Left cerebral hemisphere shows mild generalized volume loss without focal insult. There are old infarctions in the right parietal and frontoparietal junction regions. No evidence of recent infarction, mass lesion, hemorrhage, hydrocephalus or extra-axial collection Vascular: There is atherosclerotic calcification of the major vessels at the base of the brain. Skull: Normal Sinuses/Orbits: Clear except  for a single opacified ethmoid air cell on the right. Orbits negative. Other: None IMPRESSION: No acute CT finding. Old infarctions in the right middle cerebral artery territory affecting the right frontoparietal vertex and right parietal brain. Electronically Signed   By: Nelson Chimes M.D.   On: 10/04/2019 23:30   DG Knee Complete 4 Views Right  Result Date: 10/04/2019 CLINICAL DATA:  Weakness and pain, fell 3 days ago EXAM: RIGHT KNEE - COMPLETE 4+ VIEW COMPARISON:  None. FINDINGS: Frontal, bilateral oblique, lateral views of the  right knee demonstrate no fractures. Alignment is anatomic. There is mild medial compartmental joint space narrowing. No joint effusion. IMPRESSION: 1. No acute bony abnormality. Electronically Signed   By: Randa Ngo M.D.   On: 10/04/2019 19:58        Scheduled Meds: . feeding supplement (ENSURE ENLIVE)  237 mL Oral BID BM  . folic acid  1 mg Oral Daily  . LORazepam  0-4 mg Intravenous Q6H   Or  . LORazepam  0-4 mg Oral Q6H  . [START ON 10/07/2019] LORazepam  0-4 mg Intravenous Q12H   Or  . [START ON 10/07/2019] LORazepam  0-4 mg Oral Q12H  . LORazepam  0-4 mg Intravenous Q4H   Followed by  . [START ON 10/07/2019] LORazepam  0-4 mg Intravenous Q8H  . multivitamin with minerals  1 tablet Oral Daily  . thiamine  100 mg Oral Daily   Or  . thiamine  100 mg Intravenous Daily  . Warfarin - Pharmacist Dosing Inpatient   Does not apply q1800   Continuous Infusions:  Assessment & Plan:   Principal Problem:   Alcohol withdrawal syndrome, uncomplicated (HCC) Active Problems:   Hyperhomocysteinemia (HCC)   Depression   Neuropathy   History of cryptogenic cerebrovascular accident (CVA) with residual deficit   Hyponatremia   Alcoholic liver disease (HCC)   Chronic anticoagulation   History of breast cancer   Alcohol withdrawal delirium, acute, mixed level of activity (HCC)   #1 alcohol withdrawal syndrome On CIWA withdrawal protocol-scoring high Seizure fall and aspiration precautions -Transitional care consult for counseling to provide resources to assist with cutting back/abstinence --IV hydration with D/NS --Thiamine, folate and MVI   #2 hyponatremia-likely from alcohol abuse and decreased p.o. intake Improving with IV hydration We will continue to monitor  3 transaminitis-likely secondary to alcohol abuse Improving slowly Continue to monitor  #4 chronic anticoagulation with history of cryptogenic CVA with residual left hemiparesis  Hyperhomocystinemia -CT shows no  acute findings but evidence of old MCA territory infarct Continue INR pharmacy to manage INR therapeutic  #5 history of breast cancer-status post left mastectomy Continue anastrozole once med reconciliation completed  #6 depression Continue bupropion, clonazepam, duloxetine    DVT prophylaxis: On Coumadin Code Status: Full Family Communication: called daughter spoke to [place] and updated her Disposition Plan: Patient currently having acute alcohol withdrawal symptoms and once medically stable will DC home.  Will need PT OT prior to discharge.       LOS: 0 days   Time spent: 45 minutes with more than 50% on [place], MD [facility] Pager [phone]  If 7PM-7AM, please contact night-coverage [email or web address] Password [facility] 10/05/2019, 5:26 PM

## 2019-10-05 NOTE — Progress Notes (Signed)
ANTICOAGULATION CONSULT NOTE - Initial Consult  Pharmacy Consult for Warfarin (home med) Indication: stroke  No Known Allergies  Patient Measurements: Height: 5\' 2"  (157.5 cm) Weight: 149 lb 14.6 oz (68 kg) IBW/kg (Calculated) : 50.1  Vital Signs: Temp: 98.8 F (37.1 C) (02/22 0845) Temp Source: Oral (02/22 0845) BP: 145/89 (02/22 0845) Pulse Rate: 114 (02/22 0845)  Labs: Recent Labs    10/04/19 1929 10/04/19 2226 10/04/19 2242 10/05/19 0406  HGB 15.3*  --   --  13.1  HCT 44.8  --   --  37.6  PLT 269  --   --  204  LABPROT  --   --  22.7* 25.0*  INR  --   --  2.0* 2.3*  CREATININE 0.64  --   --  0.69  TROPONINIHS 11 18*  --   --    Estimated Creatinine Clearance: 71.9 mL/min (by C-G formula based on SCr of 0.69 mg/dL).  Medical History: Past Medical History:  Diagnosis Date  . Anxiety   . Breast cancer ([place]) 01/2014   Invasive lobular carcinoma, 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 ER/ PR+; Her 2 neu not overexpressed, microscopic positive margin (skin). Oncotype DX, low risk for recurrence.  . Depression   . Genetic screening 05/05/2014   negative /[facility]  . GERD (gastroesophageal reflux disease)   . Hypertension   . Stroke ([place]) 06/11/2015   cerebrum, cryptogenic right brain infarcts s/p IV TPA   Medications:  Medications Prior to Admission  Medication Sig Dispense Refill Last Dose  . anastrozole (ARIMIDEX) 1 MG tablet Take 1 tablet (1 mg total) by mouth every evening. 7 tablet 0   . atorvastatin (LIPITOR) 40 MG tablet Take 1 tablet (40 mg total) by mouth daily at 6 PM. 30 tablet 2   . baclofen (LIORESAL) 10 MG tablet Take 10 mg by mouth 3 (three) times daily.     . clonazePAM (KLONOPIN) 0.5 MG tablet Take 1 tablet (0.5 mg total) by mouth 2 (two) times daily as needed for anxiety. 4 tablet 0   . cyanocobalamin 1000 MCG tablet Take 1 tablet (1,000 mcg total) by mouth daily. 30 tablet 2   . DULoxetine (CYMBALTA) 60 MG capsule Take 60 mg by mouth daily.     Marland Kitchen  gabapentin (NEURONTIN) 600 MG tablet Take 600 mg by mouth 4 (four) times daily.     Marland Kitchen HYDROcodone-acetaminophen (NORCO/VICODIN) 5-325 MG tablet Take 1 tablet by mouth every 12 (twelve) hours as needed for severe pain. 4 tablet 0   . ibuprofen (ADVIL,MOTRIN) 400 MG tablet Take 1 tablet (400 mg total) by mouth every 6 (six) hours as needed. 30 tablet 0   . omeprazole (PRILOSEC) 20 MG capsule Take 20 mg by mouth as needed (heart burn).      Marland Kitchen oxyCODONE (ROXICODONE) 5 MG immediate release tablet Take 1 tablet (5 mg total) by mouth every 8 (eight) hours as needed. 20 tablet 0   . potassium chloride (K-DUR) 10 MEQ tablet Take 10 mEq by mouth daily.     . prochlorperazine (COMPAZINE) 5 MG tablet Take 5 mg by mouth every 6 (six) hours as needed for nausea or vomiting.     . torsemide (DEMADEX) 20 MG tablet Take 20 mg by mouth daily.      Marland Kitchen warfarin (COUMADIN) 5 MG tablet Take 1 tablet (5 mg total) by mouth one time only at 6 PM for 1 dose.       Assessment: Tina Sharp is a  54 y.o. female with hx of breast cancer, CVA (with some mild residual left hand weakness), alcohol use who presents for generalized weakness, concern for dehydration, and shakiness.  Pt on chronic Warfarin for h/o stroke, INR therapeutic on presentation.  Pt reportedly compliant on Warfarin Rx.  Home Regimen: warfarin 2mg  daily   DATE INR DOSE  2/21 2.0 2/22 2.3   Goal of Therapy:  INR 2-3 Monitor platelets by anticoagulation protocol: Yes   Plan:  2/22 INR therapeutic @ 2.3. Will continue patient's home dose of warfarin 2mg  tonight.   Will recheck INR in AM and continue to order warfarin based on lab results.    Pernell Dupre, PharmD, BCPS Clinical Pharmacist 10/05/2019 10:29 AM

## 2019-10-05 NOTE — Progress Notes (Signed)
ANTICOAGULATION CONSULT NOTE - Initial Consult  Pharmacy Consult for Warfarin (home med) Indication: stroke  No Known Allergies  Patient Measurements: Weight: 150 lb (68 kg)  Vital Signs: Temp: 98.6 F (37 C) (02/21 1921) BP: 146/70 (02/22 0230) Pulse Rate: 114 (02/22 0230)  Labs: Recent Labs    10/04/19 1929 10/04/19 2226 10/04/19 2242  HGB 15.3*  --   --   HCT 44.8  --   --   PLT 269  --   --   LABPROT  --   --  22.7*  INR  --   --  2.0*  CREATININE 0.64  --   --   TROPONINIHS 11 18*  --    CrCl cannot be calculated (Unknown ideal weight.).  Medical History: Past Medical History:  Diagnosis Date  . Anxiety   . Breast cancer ([place]) 01/2014   Invasive lobular carcinoma, 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 ER/ PR+; Her 2 neu not overexpressed, microscopic positive margin (skin). Oncotype DX, low risk for recurrence.  . Depression   . Genetic screening 05/05/2014   negative /[facility]  . GERD (gastroesophageal reflux disease)   . Hypertension   . Stroke ([place]) 06/11/2015   cerebrum, cryptogenic right brain infarcts s/p IV TPA   Medications:  (Not in a hospital admission)   Assessment: Tina Sharp is a 54 y.o. female with hx of breast cancer, CVA (with some mild residual left hand weakness), alcohol use who presents for generalized weakness, concern for dehydration, and shakiness.  Pt on chronic Warfarin for h/o stroke, INR therapeutic on presentation.  Pt reportedly compliant on Warfarin Rx.  Goal of Therapy:  INR 2-3 Monitor platelets by anticoagulation protocol: Yes   Plan:  No warfarin at this time due to therapeutic INR Check INR daily in am  Hart Robinsons A 10/05/2019,2:42 AM

## 2019-10-05 NOTE — Progress Notes (Signed)
Initial Nutrition Assessment  DOCUMENTATION CODES:   Not applicable  INTERVENTION:   Ensure Enlive po BID, each supplement provides 350 kcal and 20 grams of protein  Magic cup TID with meals, each supplement provides 290 kcal and 9 grams of protein  MVI, thiamine and folic acid in setting of eoth abuse   Liberalize diet   Pt likely at moderate refeed risk; recommend monitor K, Mg and P labs daily as oral intake improves.   NUTRITION DIAGNOSIS:   Inadequate oral intake related to acute illness as evidenced by meal completion < 25%.  GOAL:   Patient will meet greater than or equal to 90% of their needs  MONITOR:   PO intake, Supplement acceptance, Labs, Weight trends, Skin, I & O's  REASON FOR ASSESSMENT:   Malnutrition Screening Tool    ASSESSMENT:   54 y.o. 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  RD working remotely.  Suspect pt with poor appetite and oral intake at baseline r/t etoh abuse. Pt eating only sips and bites of meals in hospital. RD will add supplements to help pt meet her estimated needs. RD will also liberalize pt's diet as pt is not likely eating enough to exceed nutrient limits. Pt is at high refeed risk; monitor electrolytes. Per chart, pt appears weight stable pta.   Medications reviewed and include: folic acid, MVI, thiamine, warfarin   Labs reviewed: K 3.8 wnl, P 2.4(L), Mg 1.9 wnl  Unable to complete Nutrition-Focused physical exam at this time.   Diet Order:   Diet Order            Diet Heart Room service appropriate? Yes; Fluid consistency: Thin  Diet effective now             EDUCATION NEEDS:   No education needs  have been identified at this time  Skin:  Skin Assessment: Reviewed RN Assessment(ecchymosis)  Last BM:  2/22  Height:   Ht Readings from Last 1 Encounters:  10/05/19 5\' 2"  (1.575 m)    Weight:   Wt Readings from Last 1 Encounters:  10/05/19 68 kg    Ideal Body Weight:  50 kg  BMI:  Body mass index is 27.42 kg/m.  Estimated Nutritional Needs:   Kcal:  1500-1700kcal/day  Protein:  75-85g/day  Fluid:  >1.5L/day  Koleen Distance MS, RD, LDN Contact information available in Amion

## 2019-10-06 LAB — HIV ANTIBODY (ROUTINE TESTING W REFLEX): HIV Screen 4th Generation wRfx: NONREACTIVE — AB

## 2019-10-06 LAB — PROTIME-INR
INR: 1.8 — ABNORMAL HIGH (ref 0.8–1.2)
Prothrombin Time: 21.1 seconds — ABNORMAL HIGH (ref 11.4–15.2)

## 2019-10-06 LAB — BASIC METABOLIC PANEL
Anion gap: 8 (ref 5–15)
BUN: 9 mg/dL (ref 6–20)
CO2: 29 mmol/L (ref 22–32)
Calcium: 8.3 mg/dL — ABNORMAL LOW (ref 8.9–10.3)
Chloride: 99 mmol/L (ref 98–111)
Creatinine, Ser: 0.64 mg/dL (ref 0.44–1.00)
GFR calc Af Amer: >60 mL/min (ref >60)
GFR calc non Af Amer: >60 mL/min (ref >60)
Glucose, Bld: 114 mg/dL — ABNORMAL HIGH (ref 70–99)
Potassium: 3.5 mmol/L (ref 3.5–5.1)
Sodium: 136 mmol/L (ref 135–145)

## 2019-10-06 MED ORDER — PANTOPRAZOLE SODIUM 40 MG PO TBEC
40.0000 mg | DELAYED_RELEASE_TABLET | Freq: Every day | ORAL | Status: DC
  Administered 2019-10-06 – 2019-10-09 (×4): 40 mg via ORAL
  Filled 2019-10-06 (×4): qty 1

## 2019-10-06 MED ORDER — WARFARIN SODIUM 2.5 MG PO TABS
2.5000 mg | ORAL_TABLET | Freq: Once | ORAL | Status: AC
  Administered 2019-10-06: 19:00:00 2.5 mg via ORAL
  Filled 2019-10-06: qty 1

## 2019-10-06 MED ORDER — BUPROPION HCL ER (XL) 150 MG PO TB24
150.0000 mg | ORAL_TABLET | Freq: Every day | ORAL | Status: DC
  Administered 2019-10-06 – 2019-10-09 (×4): 150 mg via ORAL
  Filled 2019-10-06 (×4): qty 1

## 2019-10-06 MED ORDER — DULOXETINE HCL 60 MG PO CPEP
60.0000 mg | ORAL_CAPSULE | Freq: Every day | ORAL | Status: DC
  Administered 2019-10-06 – 2019-10-09 (×4): 60 mg via ORAL
  Filled 2019-10-06 (×4): qty 1

## 2019-10-06 NOTE — Progress Notes (Signed)
Patient stated she was scared and wanted a hug. Said she missed her family. Called patients daughter so she could speak to her.

## 2019-10-06 NOTE — Progress Notes (Signed)
ANTICOAGULATION CONSULT NOTE - Initial Consult  Pharmacy Consult for Warfarin (home med) Indication: stroke  No Known Allergies  Patient Measurements: Height: 5\' 2"  (157.5 cm) Weight: 149 lb 14.6 oz (68 kg) IBW/kg (Calculated) : 50.1  Vital Signs: Temp: 97.9 F (36.6 C) (02/23 0742) Temp Source: Oral (02/23 0742) BP: 131/93 (02/23 0742) Pulse Rate: 98 (02/23 0742)  Labs: Recent Labs    10/04/19 1929 10/04/19 2226 10/04/19 2242 10/05/19 0406 10/06/19 0524  HGB 15.3*  --   --  13.1  --   HCT 44.8  --   --  37.6  --   PLT 269  --   --  204  --   LABPROT  --   --  22.7* 25.0* 21.1*  INR  --   --  2.0* 2.3* 1.8*  CREATININE 0.64  --   --  0.69 0.64  TROPONINIHS 11 18*  --   --   --    Estimated Creatinine Clearance: 71.9 mL/min (by C-G formula based on SCr of 0.64 mg/dL).  Medical History: Past Medical History:  Diagnosis Date  . Anxiety   . Breast cancer ([place]) 01/2014   Invasive lobular carcinoma, 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 ER/ PR+; Her 2 neu not overexpressed, microscopic positive margin (skin). Oncotype DX, low risk for recurrence.  . Depression   . Genetic screening 05/05/2014   negative /[facility]  . GERD (gastroesophageal reflux disease)   . Hypertension   . Stroke ([place]) 06/11/2015   cerebrum, cryptogenic right brain infarcts s/p IV TPA   Medications:  Medications Prior to Admission  Medication Sig Dispense Refill Last Dose  . atorvastatin (LIPITOR) 40 MG tablet Take 1 tablet (40 mg total) by mouth daily at 6 PM. 30 tablet 2 24+ hours at Unknown  . baclofen (LIORESAL) 10 MG tablet Take 10 mg by mouth 3 (three) times daily as needed for muscle spasms.    Past Week at PRN  . buPROPion (WELLBUTRIN XL) 150 MG 24 hr tablet Take 150 mg by mouth daily.   24+ hours at Unknown  . clonazePAM (KLONOPIN) 0.5 MG tablet Take 1 tablet (0.5 mg total) by mouth 2 (two) times daily as needed for anxiety. 4 tablet 0 Unknown at PRN  . cyanocobalamin 1000 MCG tablet Take 1 tablet  (1,000 mcg total) by mouth daily. 30 tablet 2   . DULoxetine (CYMBALTA) 60 MG capsule Take 60 mg by mouth daily.   24+ hours at Unknown  . gabapentin (NEURONTIN) 600 MG tablet Take 600 mg by mouth 4 (four) times daily.   24+ hours at Unknown  . metoprolol succinate (TOPROL-XL) 25 MG 24 hr tablet Take 25 mg by mouth daily.   24+ hours at Unknown  . omeprazole (PRILOSEC) 20 MG capsule Take 20 mg by mouth as needed (heart burn).    Unknown at PRN  . potassium chloride (K-DUR) 10 MEQ tablet Take 10 mEq by mouth daily.   24+ hours at Unknown  . warfarin (COUMADIN) 1 MG tablet Take 2 mg by mouth every evening.    24+ hours at Unknown  . anastrozole (ARIMIDEX) 1 MG tablet Take 1 tablet (1 mg total) by mouth every evening. (Patient not taking: Reported on 10/05/2019) 7 tablet 0 Not Taking at Unknown time    Assessment: Tina Sharp is a 54 y.o. female with hx of breast cancer, CVA (with some mild residual left hand weakness), alcohol use who presents for generalized weakness, concern for dehydration, and shakiness.  Pt on chronic Warfarin for h/o stroke, INR therapeutic on presentation.  Pt reportedly compliant on Warfarin Rx.  Home Regimen: Warfarin 2mg  daily   DATE INR DOSE  2/21 2.0  ---- 2/22 2.3 2mg  2/23 1.8  Goal of Therapy:  INR 2-3 Monitor platelets by anticoagulation protocol: Yes   Plan:  2/23 INR subtherapeutic @ 1.8. Will order Warfarin 2.5mg  x 1 dose tonight.   Will recheck INR in AM and continue to order warfarin based on lab results.    Pernell Dupre, PharmD, BCPS Clinical Pharmacist 10/06/2019 10:25 AM

## 2019-10-06 NOTE — Evaluation (Signed)
Occupational Therapy Evaluation Patient Details Name: Tina Sharp MRN: [id number] DOB: Oct 17, 1965 Today's Date: 10/06/2019    History of Present Illness Pt admitted for alcohol withdrawl syndrome with complaints of weakness during ambulation. History includes CVA with L side weakness, breast cancer s/p mastectomy on L side, HTN, and anxiety.   Clinical Impression   Tina Sharp was seen for OT evaluation this date. Prior to hospital admission, pt was MOD I for functional mobility c RW, utilized shower chair for bathing, and Independent in medication and pet management for cat. Pt lives in a single story home with ramped entrance and conflicting information regarding who is at home with her (sister vs daughter). Case manager in room at start of session reporting pt lived with sister and pt later reported to OT that she lived with one of her daughters. Pt has 2 grandchildren aged 66 and 73 who live with another daughter. Currently pt demonstrates impairments as described below (See OT problem list) which functionally limit her ability to perform ADL/self-care tasks. Pt currently requires MIN A + increased time for seated bimanual grooming tasks and MOD A for UBD.  Pt would benefit from skilled OT to address noted impairments and functional limitations (see below for any additional details) in order to maximize safety and independence while minimizing falls risk and caregiver burden. Upon hospital discharge, recommend STR to maximize pt safety and return to PLOF.     Follow Up Recommendations  SNF    Equipment Recommendations  Other (comment)(defer to post acute)    Recommendations for Other Services       Precautions / Restrictions Precautions Precautions: Fall;Other (comment)(Seizure) Restrictions Weight Bearing Restrictions: No      Mobility Bed Mobility Overal bed mobility: Needs Assistance Bed Mobility: Supine to Sit     Supine to sit: Mod assist     General bed mobility  comments: received in recliner upon arrival, not performed  Transfers Overall transfer level: Needs assistance Equipment used: Rolling walker (2 wheeled) Transfers: Sit to/from [facility] Sit to Stand: Mod assist;From elevated surface Stand pivot transfers: Min assist(Assist - RW management c small shuffle pivot steps to chair)       General transfer comment: MOD A + VCs for safe RW technique sit>stand at EOB from elevated height. MIN A + VCs for safe RW technique stand>sit at recliner.     Balance Overall balance assessment: Needs assistance;History of Falls Sitting-balance support: Single extremity supported;Feet unsupported Sitting balance-Leahy Scale: Good     Standing balance support: Bilateral upper extremity supported Standing balance-Leahy Scale: Fair Standing balance comment: Heavy anterior lean requiring tactile cues to lower back for upright posture                            ADL either performed or assessed with clinical judgement   ADL Overall ADL's : Needs assistance/impaired                                       General ADL Comments: MIN A + increased time for toothbrushing seated EOC - assist for unscrewing and rescrewing toothpaste cap. MOD A don/doff gown seated EOC - assist for unsnapping buttons, reaching ties at base of neck, and threading lines through gown. SETUP for self-feeding reclined in chair - pt opened spoon packaging c increased time, required assist to open small ice  cream lid. OT assist to order dinner 2/2 pt not having reading glasses.      Vision Baseline Vision/History: Wears glasses Wears Glasses: Reading only Patient Visual Report: Blurring of vision Additional Comments: Pt able to read clock on wall accurately but unable to read dining menu. OT assist to order meal     Perception     Praxis      Pertinent Vitals/Pain Pain Assessment: Faces Faces Pain Scale: Hurts a little bit Pain  Location: R forearm IV site(Pt endorsed pain at site, not quantified) Pain Descriptors / Indicators: Grimacing Pain Intervention(s): Monitored during session;Repositioned     Hand Dominance Right   Extremity/Trunk Assessment Upper Extremity Assessment Upper Extremity Assessment: Generalized weakness;RUE deficits/detail;LUE deficits/detail RUE Deficits / Details: AROM: shoulder flexion ~90*, elbow flexion WFL. MMT: 4/5 grossly. [place]: 5 finger opposition intact, RAM decreased coordination/speed LUE Deficits / Details: AROM: shoulder flexion ~90*, elbow flexion WFL. MMT: 3+/5 grossly. [place]: 5 finger opposition intact c increased time (slower than R), RAM decreased coordination/speed (slower than R). Mild sporadic LUE tremor noted at rest. Pt frequently rested L hand in wrist flexion and supination    Lower Extremity Assessment Lower Extremity Assessment: Generalized weakness       Communication Communication Communication: No difficulties   Cognition Arousal/Alertness: Awake/alert Behavior During Therapy: Anxious(Tearful t/o) Overall Cognitive Status: Within Functional Limits for tasks assessed                                     General Comments  R knee redness and bruising noted    Exercises Exercises: Other exercises Other Exercises Other Exercises: Pt educated re: neuromuscular re-education, RW technique, importance of OOB activity for functional strength endurance, routine modifications, falls prevention Other Exercises: Self-feeding/drinking, don/doff gown, toothbrushing, sup>sit, sit<>stand, therapeutic use of self, ordering nutritious meal   Shoulder Instructions      Home Living Family/patient expects to be discharged to:: Private residence Living Arrangements: Other relatives(pt became teary upon questioning because pt previously lived with her mother until 07/2019 when her mother passed. When asked who would be home at discharge pt states her daughter would  be home when not at work). Available Help at Discharge: Family;Available PRN/intermittently(Daughter available PRN) Type of Home: House Home Access: Ramped entrance     Home Layout: One level     Bathroom Shower/Tub: Teacher, early years/pre: Handicapped height Bathroom Accessibility: Yes How Accessible: Accessible via walker Home Equipment: [place] - 2 wheels;Wheelchair - manual;Shower seat;Grab bars - tub/shower;Hand held Tourist information centre manager - 4 wheels          Prior Functioning/Environment Level of Independence: Independent with assistive device(s)        Comments: Pt reports using RW for mobility in home and sitting in shower chair to bathe. Pt endorses 2 falls within the last 6 months. Pt reports that she drives, but had been using instacart 2/2 the pandemic. Pt was independent in medication management c pill boxes. Pt recently lived with her mother until her mother's recent passing in Dec 2020.         OT Problem List: Decreased strength;Decreased range of motion;Impaired balance (sitting and/or standing);Decreased coordination;Decreased safety awareness;Decreased knowledge of use of DME or AE;Decreased knowledge of precautions;Impaired UE functional use      OT Treatment/Interventions: Self-care/ADL training;Therapeutic exercise;Neuromuscular education;Energy conservation;DME and/or AE instruction;Therapeutic activities;Visual/perceptual remediation/compensation;Patient/family education;Balance training    OT Goals(Current goals can be found  in the care plan section) Acute Rehab OT Goals Patient Stated Goal: to go home OT Goal Formulation: With patient Time For Goal Achievement: 10/20/19 Potential to Achieve Goals: Good ADL Goals Pt Will Perform Grooming: Independently;sitting(c AE PRN) Pt Will Perform Upper Body Dressing: with min assist;with adaptive equipment;sitting Pt Will Perform Toileting - Clothing Manipulation and hygiene: with supervision;sit to/from  stand(c AE + LRAD PRN)  OT Frequency: Min 2X/week   Barriers to D/C: Decreased caregiver support          Co-evaluation              AM-PAC OT "6 Clicks" Daily Activity     Outcome Measure Help from another person eating meals?: A Little Help from another person taking care of personal grooming?: A Little Help from another person toileting, which includes using toliet, bedpan, or urinal?: A Lot Help from another person bathing (including washing, rinsing, drying)?: A Lot Help from another person to put on and taking off regular upper body clothing?: A Lot Help from another person to put on and taking off regular lower body clothing?: A Lot 6 Click Score: 14   End of Session Equipment Utilized During Treatment: Gait belt;Rolling walker  Activity Tolerance: Patient tolerated treatment well Patient left: in chair;with call bell/phone within reach;with chair alarm set  OT Visit Diagnosis: Unsteadiness on feet (R26.81);Repeated falls (R29.6)                Time: [id number] OT Time Calculation (min): 49 min Charges:  OT General Charges $OT Visit: 1 Visit OT Evaluation $OT Eval Moderate Complexity: 1 Mod OT Treatments $Self Care/Home Management : 23-37 mins $Therapeutic Activity: 8-22 mins  Dessie Coma, M.S. OTR/L  10/06/19, 4:42 PM

## 2019-10-06 NOTE — Progress Notes (Addendum)
PROGRESS NOTE    KIJANA POPOVICH  [id number] DOB: 15-Sep-1965 DOA: 10/04/2019 PCP: Kirk Ruths, MD    Brief Narrative:  Tina Sharp is a 54 y.o. 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 Saw pt this am.  She keeps requesting going home.  She becomes tearful because she does not want to be here and wants to go home.  She reports she has no shakes.  I explained to her she is still having withdrawal symptoms as she is requiring medication for this.  Objective: Vitals:   10/05/19 1656 10/05/19 2225 10/06/19 0358 10/06/19 0742  BP: 135/80 116/63 118/68 (!) 131/93  Pulse: (!) 112 (!) 118 (!) 117 98  Resp: 18 14 16 16   Temp: 98.2 F (36.8 C) 98.5 F (36.9 C) 98.3 F (36.8 C) 97.9 F (36.6 C)  TempSrc: Oral Oral Oral Oral  SpO2: 99% 95% 96% 98%  Weight:      Height:        Intake/Output Summary (Last 24 hours) at 10/06/2019 0745 Last data filed at 10/06/2019 [id number] Gross per 24 hour  Intake 1050 ml  Output 1050 ml  Net 0 ml   Filed Weights   10/04/19 1921 10/05/19 0342  Weight: 68 kg 68 kg    Examination:  General exam: Tearful at times, NAD sitting in chair Respiratory system: Clear to auscultation  . No wheezing or rhonchi's Cardiovascular system: S1 & S2 heard, RRR. No JVD, murmurs, rubs, gallops or clicks.  Gastrointestinal system: Abdomen is nondistended, soft and nontender.. Normal bowel sounds heard. Central nervous system: Alert oriented x3  extremities: No edema.  Minimal  tremor of upper extremities Skin: Warm dry Psychiatry: Unable to assess    Data Reviewed: I have personally reviewed following labs and imaging studies  CBC: Recent Labs  Lab 10/04/19 1929 10/05/19 0406  WBC 7.0 5.8  HGB 15.3* 13.1  HCT 44.8 37.6  MCV 95.7 94.9  PLT 269 0000000   Basic Metabolic Panel: Recent Labs  Lab 10/04/19 1929 10/05/19 0406 10/06/19 0524  NA 131* 135 136  K 4.1 3.8 3.5  CL 90* 97* 99  CO2 22 26 29   GLUCOSE 88 86 114*  BUN 7 7 9   CREATININE 0.64 0.69 0.64  CALCIUM 8.6* 7.7* 8.3*  MG  --  1.9  --   PHOS  --  2.4*  --    GFR: Estimated Creatinine Clearance: 71.9 mL/min (by C-G formula based on SCr of 0.64 mg/dL). Liver Function Tests: Recent Labs  Lab 10/04/19 1929 10/05/19 0406  AST 119* 103*  ALT 81* 65*  ALKPHOS 95 78  BILITOT 1.3* 1.6*  PROT 7.9 6.6  ALBUMIN 4.6 3.6   No results for input(s): LIPASE, AMYLASE in the last 168 hours. No results for input(s): AMMONIA in the last 168 hours. Coagulation Profile: Recent Labs  Lab 10/04/19 2242 10/05/19 0406 10/06/19 0524  INR 2.0* 2.3* 1.8*   Cardiac Enzymes: No results for input(s): CKTOTAL, CKMB, CKMBINDEX, TROPONINI in the last 168 hours. BNP (last 3 results) No results for  input(s): PROBNP in the last 8760 hours. HbA1C: No results for input(s): HGBA1C in the last 72 hours. CBG: No results for input(s): GLUCAP in the last 168 hours. Lipid Profile: No results for input(s): CHOL, HDL, LDLCALC, TRIG, CHOLHDL, LDLDIRECT in the last 72 hours. Thyroid Function Tests: No results for input(s): TSH, T4TOTAL, FREET4, T3FREE, THYROIDAB in the last 72 hours. Anemia Panel: No results for input(s): VITAMINB12, FOLATE, FERRITIN, TIBC, IRON, RETICCTPCT in the last 72 hours. Sepsis Labs: No results for input(s): PROCALCITON, LATICACIDVEN in the last 168 hours.  Recent Results (from the past 240 hour(s))  Respiratory Panel by RT PCR (Flu A&B, Covid) - Nasopharyngeal Swab     Status: None    Collection Time: 10/05/19  2:25 AM   Specimen: Nasopharyngeal Swab  Result Value Ref Range Status   SARS Coronavirus 2 by RT PCR NEGATIVE NEGATIVE Final    Comment: (NOTE) SARS-CoV-2 target nucleic acids are NOT DETECTED. The SARS-CoV-2 RNA is generally detectable in upper respiratoy specimens during the acute phase of infection. The lowest concentration of SARS-CoV-2 viral copies this assay can detect is 131 copies/mL. A negative result does not preclude SARS-Cov-2 infection and should not be used as the sole basis for treatment or other patient management decisions. A negative result may occur with  improper specimen collection/handling, submission of specimen other than nasopharyngeal swab, presence of viral mutation(s) within the areas targeted by this assay, and inadequate number of viral copies (<131 copies/mL). A negative result must be combined with clinical observations, patient history, and epidemiological information. The expected result is Negative. Fact Sheet for Patients:  PinkCheek.be Fact Sheet for Healthcare Providers:  GravelBags.it This test is not yet ap proved or cleared by the [place] FDA and  has been authorized for detection and/or diagnosis of SARS-CoV-2 by FDA under an Emergency Use Authorization (EUA). This EUA will remain  in effect (meaning this test can be used) for the duration of the COVID-19 declaration under Section 564(b)(1) of the Act, 21 U.S.C. section 360bbb-3(b)(1), unless the authorization is terminated or revoked sooner.    Influenza A by PCR NEGATIVE NEGATIVE Final   Influenza B by PCR NEGATIVE NEGATIVE Final    Comment: (NOTE) The Xpert Xpress SARS-CoV-2/FLU/RSV assay is intended as an aid in  the diagnosis of influenza from Nasopharyngeal swab specimens and  should not be used as a sole basis for treatment. Nasal washings and  aspirates are unacceptable for Xpert Xpress  SARS-CoV-2/FLU/RSV  testing. Fact Sheet for Patients: PinkCheek.be Fact Sheet for Healthcare Providers: GravelBags.it This test is not yet approved or cleared by the [place] FDA and  has been authorized for detection and/or diagnosis of SARS-CoV-2 by  FDA under an Emergency Use Authorization (EUA). This EUA will remain  in effect (meaning this test can be used) for the duration of the  Covid-19 declaration under Section 564(b)(1) of the Act, 21  U.S.C. section 360bbb-3(b)(1), unless the authorization is  terminated or revoked. Performed at [facility], [address] [place], [place] [address]          Radiology Studies: CT Head Wo Contrast  Result Date: 10/04/2019 CLINICAL DATA:  Generalized weakness. Left facial numbness. Symptoms worsening throughout the day. EXAM: CT HEAD WITHOUT CONTRAST TECHNIQUE: Contiguous axial images were obtained from the base of the skull through the vertex without intravenous contrast. COMPARISON:  10/27/2018 FINDINGS: Brain: No abnormality seen affecting the brainstem or cerebellum. Left cerebral hemisphere shows mild generalized volume loss without focal insult.  There are old infarctions in the right parietal and frontoparietal junction regions. No evidence of recent infarction, mass lesion, hemorrhage, hydrocephalus or extra-axial collection Vascular: There is atherosclerotic calcification of the major vessels at the base of the brain. Skull: Normal Sinuses/Orbits: Clear except for a single opacified ethmoid air cell on the right. Orbits negative. Other: None IMPRESSION: No acute CT finding. Old infarctions in the right middle cerebral artery territory affecting the right frontoparietal vertex and right parietal brain. Electronically Signed   By: Nelson Chimes M.D.   On: 10/04/2019 23:30   DG Knee Complete 4 Views Right  Result Date: 10/04/2019 CLINICAL DATA:  Weakness and pain,  fell 3 days ago EXAM: RIGHT KNEE - COMPLETE 4+ VIEW COMPARISON:  None. FINDINGS: Frontal, bilateral oblique, lateral views of the right knee demonstrate no fractures. Alignment is anatomic. There is mild medial compartmental joint space narrowing. No joint effusion. IMPRESSION: 1. No acute bony abnormality. Electronically Signed   By: Randa Ngo M.D.   On: 10/04/2019 19:58        Scheduled Meds: . feeding supplement (ENSURE ENLIVE)  237 mL Oral BID BM  . folic acid  1 mg Oral Daily  . [START ON 10/07/2019] LORazepam  0-4 mg Intravenous Q12H   Or  . [START ON 10/07/2019] LORazepam  0-4 mg Oral Q12H  . LORazepam  0-4 mg Intravenous Q4H   Followed by  . [START ON 10/07/2019] LORazepam  0-4 mg Intravenous Q8H  . multivitamin with minerals  1 tablet Oral Daily  . thiamine  100 mg Oral Daily   Or  . thiamine  100 mg Intravenous Daily  . Warfarin - Pharmacist Dosing Inpatient   Does not apply q1800   Continuous Infusions:  Assessment & Plan:   Principal Problem:   Alcohol withdrawal syndrome, uncomplicated (HCC) Active Problems:   Hyperhomocysteinemia (HCC)   Depression   Neuropathy   History of cryptogenic cerebrovascular accident (CVA) with residual deficit   Hyponatremia   Alcoholic liver disease (HCC)   Chronic anticoagulation   History of breast cancer   Alcohol withdrawal delirium, acute, mixed level of activity (HCC)   #1 alcohol withdrawal syndrome On CIWA withdrawal protocol-scoring still Seizure fall and aspiration precautions -Transitional care consult for counseling to provide resources to assist with cutting back/abstinence --IV hydration with D/NS --Thiamine, folate and MVI   #2 hyponatremia-likely from alcohol abuse and decreased p.o. intake Improved with IV hydration We will continue to monitor  3 transaminitis-likely secondary to alcohol abuse Improving slowly Continue to monitor  #4 chronic anticoagulation with history of cryptogenic CVA with  residual left hemiparesis  Hyperhomocystinemia -CT shows no acute findings but evidence of old MCA territory infarct Continue INR pharmacy to manage INR therapeutic  #5 history of breast cancer-status post left mastectomy Continue anastrozole once med reconciliation completed  #6 depression Continue bupropion, clonazepam, duloxetine    DVT prophylaxis: On Coumadin Code Status: Full Family Communication: called daughter spoke to [place] and updated her Disposition Plan/Barrier: Patient currently having acute alcohol withdrawal symptoms and once medically stable will DC back to home.  Will need PT OT prior to discharge.        LOS: 1 day   Time spent: 45 minutes with more than 50% on [place], MD [facility] Pager [phone]  If 7PM-7AM, please contact night-coverage [email or web address] Password [facility] 10/06/2019, 7:45 AM Patient ID: MAKAYLA DILALLO, female   DOB: 07-31-1966, 54 y.o.   MRN: [id number]

## 2019-10-06 NOTE — TOC Initial Note (Signed)
Transition of Care [facility]) - Initial/Assessment Note    Patient Details  Name: Tina Sharp MRN: [id number] Date of Birth: 06-Sep-1965  Transition of [facility]) CM/SW Contact:    Su Hilt, RN Phone Number: 10/06/2019, 2:42 PM  Clinical Narrative:        Met with the patient to discuss DC plan and needs She lives at home with er younger 56 year old sister, she was living with her mother but her mother passed Dec 15th 2020, she said she is working with the estate trying to get it situated and it is a lot of stress.  She became very tearful for a few minutes.  She agreed to [facility] coming to do PT, OT, and aide, she has a RW and a shower seat at home, he dad and her siblings as well as neighbors provide transportation for her , she was provided with ETOH abuse resources for therapy outpatient and to shelters and rehab facilities,     She stated that she has no other needs at this time    Expected Discharge Plan: [place] Barriers to Discharge: Continued Medical Work up   Patient Goals and CMS Choice Patient states their goals for this hospitalization and ongoing recovery are:: go home      Expected Discharge Plan and Services Expected Discharge Plan: [place]   Discharge Planning Services: CM Consult   Living arrangements for the past 2 months: Single Family Home                 DME Arranged: N/A         HH Arranged: PT, OT, Nurse's Aide HH Agency: [facility] [facility] [employer] Date [place]: 10/06/19 Time [place]: 1441 Representative spoke with at [place]: [place] Arrangements/Services Living arrangements for the past 2 months: [place] Lives with:: Siblings(48 year old younger sister) Patient language and need for interpreter reviewed:: Yes Do you feel safe going back to the place where you live?: Yes      Need for Family Participation in Patient Care: No  (Comment) Care giver support system in place?: Yes (comment) Current home services: DME(rw, shower seat) Criminal Activity/Legal Involvement Pertinent to Current Situation/Hospitalization: No - Comment as needed  Activities of Daily Living Home Assistive Devices/Equipment: Eyeglasses, [place] Rile (specify type) ADL Screening (condition at time of admission) Patient's cognitive ability adequate to safely complete daily activities?: Yes Is the patient deaf or have difficulty hearing?: No Does the patient have difficulty seeing, even when wearing glasses/contacts?: No Does the patient have difficulty concentrating, remembering, or making decisions?: No Patient able to express need for assistance with ADLs?: Yes Does the patient have difficulty dressing or bathing?: No Independently performs ADLs?: Yes (appropriate for developmental age) Does the patient have difficulty walking or climbing stairs?: No Weakness of Legs: None Weakness of Arms/Hands: None  Permission Sought/Granted   Permission granted to share information with : Yes, Verbal Permission Granted              Emotional Assessment Appearance:: Appears older than stated age Attitude/Demeanor/Rapport: Crying Affect (typically observed): Accepting, Appropriate, Sad Orientation: : Oriented to Self, Oriented to Situation, Oriented to Place, Oriented to  Time Alcohol / Substance Use: Alcohol Use, Tobacco Use Psych Involvement: No (comment)  Admission diagnosis:  Shakiness [R25.1] Alcohol use [Z72.89] Generalized weakness [R53.1] Fall, initial encounter [W19.XXXA] Alcohol withdrawal syndrome without complication ([place]) [W09.811] Alcohol  withdrawal delirium, acute, mixed level of activity ([place]) [F10.231] Patient Active Problem List   Diagnosis Date Noted  . 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 Hyponatremia 10/05/2019  . Alcoholic liver disease ([place]) 10/05/2019  . Chronic  anticoagulation 10/05/2019  . History of breast cancer 10/05/2019  . Alcohol withdrawal delirium, acute, mixed level of activity ([place]) 10/05/2019  . Alcohol withdrawal syndrome, uncomplicated ([place]) 35/36/1443  . Fall 09/12/2018  . Lymphedema 04/30/2018  . Hypertension 01/14/2018  . Leg swelling 01/14/2018  . Breast calcifications on mammogram 06/24/2017  . Hip fracture ([place]) 07/30/2016  . 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 Rotator cuff tendinitis, left 06/15/2016  . Neuropathy 01/03/2016  . Ataxia 01/03/2016  . Myalgia 09/08/2015  . Depression 09/06/2015  . Cerebrovascular accident (CVA) due to thrombosis of posterior cerebral artery ([place]) 08/18/2015  . 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 Stroke with cerebral ischemia ([place]) 07/12/2015  . HLD (hyperlipidemia) 07/12/2015  . CVA (cerebral infarction) 06/21/2015  . TIA (transient ischemic attack) 06/21/2015  . Macrocytic anemia 06/20/2015  . Headache 06/14/2015  . Cigarette smoker 06/14/2015  . Stroke (cerebrum) ([place]) cryptogenic R brain infarcts s/p IV tPA  06/11/2015  . 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

## 2019-10-06 NOTE — Evaluation (Signed)
Physical Therapy Evaluation Patient Details Name: Tina Sharp MRN: [id number] DOB: 08/07/1966 Today's Date: 10/06/2019   History of Present Illness  Pt admitted for alcohol withdrawl syndrome with complaints of weakness during ambulation. HIstory includes CVA with L side weakness, breast cancer s/p mastectomy on L side, HTN, and anxiety.  Clinical Impression  Pt is a pleasant 54 year old female who was admitted for alcohol withdrawal syndrome. Pt in recliner upon arrival, sleeping about to fall out of chair. Lunch spilled all over bed and floor. Takes mod cues for waking up patient. Slight UE tremors noted when pt becomes tearful/anxious. She reports she feels weaker than usual and becomes tearful saying "how did this happen so fast to me". Pt performs transfers with mod A +2 and ambulation with mod A +2 with RW with post leaning noted. Pt demonstrates deficits with strength/mobility/balance. Able to ambulate to [facility], however needs significant assist for hygiene and is very high falls risk. Reports recent falls with bruising noted on R knee. Doesn't appear to be at baseline level at this time. Would benefit from skilled PT to address above deficits and promote optimal return to PLOF; recommend transition to STR upon discharge from acute hospitalization.     Follow Up Recommendations SNF    Equipment Recommendations  None recommended by PT    Recommendations for Other Services       Precautions / Restrictions Precautions Precautions: Fall Restrictions Weight Bearing Restrictions: No      Mobility  Bed Mobility               General bed mobility comments: received in recliner upon arrival, not performed  Transfers Overall transfer level: Needs assistance Equipment used: Rolling walker (2 wheeled) Transfers: Sit to/from Stand Sit to Stand: Mod assist;+2 physical assistance         General transfer comment: several attempts performed with mod/max assist and pt able to  maintain standing although post leaning noted. +2 given with mod assist +2 and RW  Ambulation/Gait Ambulation/Gait assistance: Mod assist;+2 physical assistance Gait Distance (Feet): 10 Feet Assistive device: Rolling walker (2 wheeled) Gait Pattern/deviations: Step-to pattern     General Gait Details: ambulated short distance in room to [facility] using RW. Very slow gait speed and short step length. Heavy fatigue.  Stairs            Wheelchair Mobility    Modified Rankin (Stroke Patients Only)       Balance Overall balance assessment: Needs assistance;History of Falls Sitting-balance support: Single extremity supported;Feet unsupported Sitting balance-Leahy Scale: Good     Standing balance support: Bilateral upper extremity supported Standing balance-Leahy Scale: Poor Standing balance comment: heavy post leaning, needs +2 for upright posture                             Pertinent Vitals/Pain Pain Assessment: Faces Faces Pain Scale: Hurts a little bit Pain Location: B LEs Pain Descriptors / Indicators: Aching Pain Intervention(s): Limited activity within patient's tolerance    Home Living Family/patient expects to be discharged to:: Private residence Living Arrangements: Other relatives(lives with sister) Available Help at Discharge: Family Type of Home: House Home Access: Ramped entrance     Home Layout: One level Home Equipment: Environmental consultant - 2 wheels;Wheelchair - manual      Prior Function Level of Independence: Independent with assistive device(s)         Comments: reports she was able to ambulate with  RW independently at home for short distances. If she uses WC, she reports she is unable self propel     Hand Dominance        Extremity/Trunk Assessment   Upper Extremity Assessment Upper Extremity Assessment: Generalized weakness(noted during functional assessment)    Lower Extremity Assessment Lower Extremity Assessment: Generalized  weakness(B LE noted through functional assessment)       Communication   Communication: No difficulties  Cognition Arousal/Alertness: Awake/alert Behavior During Therapy: Anxious(tearful) Overall Cognitive Status: Within Functional Limits for tasks assessed                                        General Comments      Exercises Other Exercises Other Exercises: ambulated over to [facility] incontinent of stool in floor during ambulation. Once seated on BSC, unable to stand for hygiene without +2 assist and dep for clean up. Other Exercises: floor and bed dirty from pt spilling lunch. EVS called for cleaning   Assessment/Plan    PT Assessment Patient needs continued PT services  PT Problem List Decreased strength;Decreased activity tolerance;Decreased balance;Decreased mobility;Decreased safety awareness       PT Treatment Interventions Gait training;Therapeutic exercise;Balance training;DME instruction    PT Goals (Current goals can be found in the Care Plan section)  Acute Rehab PT Goals Patient Stated Goal: to go home PT Goal Formulation: With patient Time For Goal Achievement: 10/20/19 Potential to Achieve Goals: Good    Frequency Min 2X/week   Barriers to discharge        Co-evaluation               AM-PAC PT "6 Clicks" Mobility  Outcome Measure Help needed turning from your back to your side while in a flat bed without using bedrails?: A Little Help needed moving from lying on your back to sitting on the side of a flat bed without using bedrails?: A Little Help needed moving to and from a bed to a chair (including a wheelchair)?: A Lot Help needed standing up from a chair using your arms (e.g., wheelchair or bedside chair)?: A Lot Help needed to walk in hospital room?: A Lot Help needed climbing 3-5 steps with a railing? : Total 6 Click Score: 13    End of Session Equipment Utilized During Treatment: Gait belt Activity Tolerance: Patient  limited by fatigue Patient left: in chair(no chair alarm on, CNA notified) Nurse Communication: Mobility status PT Visit Diagnosis: Unsteadiness on feet (R26.81);Repeated falls (R29.6);Muscle weakness (generalized) (M62.81);History of falling (Z91.81);Difficulty in walking, not elsewhere classified (R26.2)    Time: [id number] PT Time Calculation (min) (ACUTE ONLY): 37 min   Charges:   PT Evaluation $PT Eval Moderate Complexity: 1 Mod PT Treatments $Gait Training: 8-22 mins $Therapeutic Activity: 8-22 mins        Greggory Stallion, PT, DPT [phone]   Annison Birchard 10/06/2019, 3:43 PM

## 2019-10-07 DIAGNOSIS — F10230 Alcohol dependence with withdrawal, uncomplicated: Secondary | ICD-10-CM

## 2019-10-07 LAB — PROTIME-INR
INR: 1.5 — ABNORMAL HIGH (ref 0.8–1.2)
Prothrombin Time: 18.4 seconds — ABNORMAL HIGH (ref 11.4–15.2)

## 2019-10-07 MED ORDER — WARFARIN SODIUM 4 MG PO TABS
4.0000 mg | ORAL_TABLET | Freq: Once | ORAL | Status: AC
  Administered 2019-10-07: 18:00:00 4 mg via ORAL
  Filled 2019-10-07: qty 1

## 2019-10-07 MED ORDER — CALCIUM CARBONATE ANTACID 500 MG PO CHEW
1.0000 | CHEWABLE_TABLET | Freq: Three times a day (TID) | ORAL | Status: DC | PRN
  Administered 2019-10-07: 200 mg via ORAL
  Filled 2019-10-07: qty 1

## 2019-10-07 MED ORDER — TRAMADOL HCL 50 MG PO TABS
50.0000 mg | ORAL_TABLET | Freq: Four times a day (QID) | ORAL | Status: DC | PRN
  Administered 2019-10-07 – 2019-10-09 (×7): 50 mg via ORAL
  Filled 2019-10-07 (×7): qty 1

## 2019-10-07 MED ORDER — ENOXAPARIN SODIUM 40 MG/0.4ML ~~LOC~~ SOLN
40.0000 mg | SUBCUTANEOUS | Status: DC
  Administered 2019-10-07 – 2019-10-09 (×3): 40 mg via SUBCUTANEOUS
  Filled 2019-10-07 (×3): qty 0.4

## 2019-10-07 NOTE — Progress Notes (Signed)
The nurse messaged the dr per pt request.  Pt requesting medication for heartburn.  Waiting response.

## 2019-10-07 NOTE — Progress Notes (Addendum)
ANTICOAGULATION CONSULT NOTE - Initial Consult  Pharmacy Consult for Warfarin (home med) Indication: stroke  No Known Allergies  Patient Measurements: Height: 5\' 2"  (157.5 cm) Weight: 149 lb 14.6 oz (68 kg) IBW/kg (Calculated) : 50.1  Vital Signs: Temp: 98.5 F (36.9 C) (02/24 0745) Temp Source: Oral (02/24 0745) BP: 130/83 (02/24 0745) Pulse Rate: 96 (02/24 0745)  Labs: Recent Labs    10/04/19 1929 10/04/19 2226 10/04/19 2242 10/05/19 0406 10/06/19 0524 10/07/19 0434  HGB 15.3*  --   --  13.1  --   --   HCT 44.8  --   --  37.6  --   --   PLT 269  --   --  204  --   --   LABPROT  --   --    < > 25.0* 21.1* 18.4*  INR  --   --    < > 2.3* 1.8* 1.5*  CREATININE 0.64  --   --  0.69 0.64  --   TROPONINIHS 11 18*  --   --   --   --    < > = values in this interval not displayed.   Estimated Creatinine Clearance: 71.9 mL/min (by C-G formula based on SCr of 0.64 mg/dL).  Medical History: Past Medical History:  Diagnosis Date  . Anxiety   . Breast cancer ([place]) 01/2014   Invasive lobular carcinoma, 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 ER/ PR+; Her 2 neu not overexpressed, microscopic positive margin (skin). Oncotype DX, low risk for recurrence.  . Depression   . Genetic screening 05/05/2014   negative /[facility]  . GERD (gastroesophageal reflux disease)   . Hypertension   . Stroke ([place]) 06/11/2015   cerebrum, cryptogenic right brain infarcts s/p IV TPA   Medications:  Medications Prior to Admission  Medication Sig Dispense Refill Last Dose  . atorvastatin (LIPITOR) 40 MG tablet Take 1 tablet (40 mg total) by mouth daily at 6 PM. 30 tablet 2 24+ hours at Unknown  . baclofen (LIORESAL) 10 MG tablet Take 10 mg by mouth 3 (three) times daily as needed for muscle spasms.    Past Week at PRN  . buPROPion (WELLBUTRIN XL) 150 MG 24 hr tablet Take 150 mg by mouth daily.   24+ hours at Unknown  . clonazePAM (KLONOPIN) 0.5 MG tablet Take 1 tablet (0.5 mg total) by mouth 2 (two) times  daily as needed for anxiety. 4 tablet 0 Unknown at PRN  . cyanocobalamin 1000 MCG tablet Take 1 tablet (1,000 mcg total) by mouth daily. 30 tablet 2   . DULoxetine (CYMBALTA) 60 MG capsule Take 60 mg by mouth daily.   24+ hours at Unknown  . gabapentin (NEURONTIN) 600 MG tablet Take 600 mg by mouth 4 (four) times daily.   24+ hours at Unknown  . metoprolol succinate (TOPROL-XL) 25 MG 24 hr tablet Take 25 mg by mouth daily.   24+ hours at Unknown  . omeprazole (PRILOSEC) 20 MG capsule Take 20 mg by mouth as needed (heart burn).    Unknown at PRN  . potassium chloride (K-DUR) 10 MEQ tablet Take 10 mEq by mouth daily.   24+ hours at Unknown  . warfarin (COUMADIN) 1 MG tablet Take 2 mg by mouth every evening.    24+ hours at Unknown  . anastrozole (ARIMIDEX) 1 MG tablet Take 1 tablet (1 mg total) by mouth every evening. (Patient not taking: Reported on 10/05/2019) 7 tablet 0 Not Taking at Unknown time  Assessment: Tina Sharp is a 54 y.o. female with hx of breast cancer, CVA (with some mild residual left hand weakness), alcohol use who presents for generalized weakness, concern for dehydration, and shakiness.  Pt on chronic Warfarin for h/o stroke, INR therapeutic on presentation.  Pt reportedly compliant on Warfarin Rx.  Home Regimen: Warfarin 2mg  daily   DATE INR DOSE  2/21 2.0  ---- 2/22 2.3 2mg  2/23 1.8 2.5mg   Goal of Therapy:  INR 2-3 Monitor platelets by anticoagulation protocol: Yes   Plan:  2/24 INR subtherapeutic @ 1.5 and trending down. Will order Warfarin 4 mg x 1 dose tonight. Enoxaparin 40mg  daily ordered while INR subtherapeutic.    Will recheck INR in AM and continue to order warfarin based on lab results.    Pernell Dupre, PharmD, BCPS Clinical Pharmacist 10/07/2019 8:23 AM

## 2019-10-07 NOTE — TOC Progression Note (Signed)
Transition of Care [facility]) - Progression Note    Patient Details  Name: Tina Sharp MRN: [id number] Date of Birth: 1965/10/02  Transition of Care [facility]) CM/SW [place], RN Phone Number: 10/07/2019, 10:42 AM  Clinical Narrative:   Met with the patient and discussed PT and OPT recommendation for SNF rehab, she is agreeable, FL2 PASSR and bedsearch completed, will review with the patient once obtained    Expected Discharge Plan: [place] Barriers to Discharge: Continued Medical Work up  Expected Discharge Plan and Services Expected Discharge Plan: [place]   Discharge Planning Services: CM Consult   Living arrangements for the past 2 months: Single Family Home                 DME Arranged: N/A         HH Arranged: PT, OT, Nurse's Aide [place] Agency: [facility] [employer] Date [place]: 10/06/19 Time [place]: [place] Representative spoke with at [place]: [place] ([place]) Interventions    Readmission Risk Interventions No flowsheet data found.

## 2019-10-07 NOTE — Progress Notes (Signed)
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 Seizure fall and aspiration precautions. Continue on IVFs. Continue on thiamine, folate, & MVI  Hyponatremia: likely from alcohol abuse. Resolved  Transaminitis: likely secondary to alcohol abuse. Will continue to monitor   Hx of cryptogenic CVA: with residual left hemiparesis. Hx of hyperhomocystinemia & on coumadin.  CT shows no acute findings but evidence of old MCA territory infarct. Continue to monitor INR daily. INR is subtherapeutic    History of breast cancer: s/p left mastectomy. Pt is not taking anastrozole   Depression: severity unknown.Continue bupropion, clonazepam, duloxetine. Recommend pt see a therapist as an outpatient     DVT prophylaxis: lovenox, coumadin  Code Status: full Family Communication:  Disposition Plan: PT recs SNF. Awaiting on bed offers and still receiving IV ativan for CIWA protocol    Consultants:      Procedures:    Antimicrobials: n/a   Subjective: Pt c/o back pain   Objective: Vitals:   10/06/19 2023 10/06/19 2025 10/07/19 0128 10/07/19 0745  BP: 132/79 132/79 (!) 158/78 130/83  Pulse: (!) 115 (!) 110 92 96  Resp:  14 18 14   Temp:  98 F (36.7 C) 98.7 F (37.1 C) 98.5 F (36.9 C)  TempSrc:  Oral Oral Oral  SpO2:  95% 94% 96%  Weight:      Height:        Intake/Output Summary (Last 24 hours) at 10/07/2019 0830 Last data  filed at 10/07/2019 0521 Gross per 24 hour  Intake 240 ml  Output 400 ml  Net -160 ml   Filed Weights   10/04/19 1921 10/05/19 0342  Weight: 68 kg 68 kg    Examination:  General exam: Appears calm and comfortable  Respiratory system: Clear to auscultation. No wheezes, rales or rhonchi Cardiovascular system: S1 & S2 +. No rubs, gallops or clicks.  Gastrointestinal system: Abdomen is nondistended, soft and nontender. Normal bowel sounds heard. Central nervous system: Alert and oriented. Moves all 4 extremities  Psychiatry: Judgement and insight appear normal. Flat mood and affect.     Data Reviewed: I have personally reviewed following labs and imaging studies  CBC: Recent Labs  Lab 10/04/19 1929 10/05/19 0406  WBC 7.0 5.8  HGB 15.3* 13.1  HCT 44.8 37.6  MCV 95.7 94.9  PLT 269 0000000   Basic Metabolic Panel: Recent Labs  Lab 10/04/19 1929 10/05/19 0406 10/06/19 0524  NA 131* 135 136  K 4.1 3.8 3.5  CL 90* 97* 99  CO2 22 26 29   GLUCOSE 88 86 114*  BUN 7 7 9   CREATININE 0.64 0.69 0.64  CALCIUM 8.6* 7.7* 8.3*  MG  --  1.9  --   PHOS  --  2.4*  --    GFR: Estimated Creatinine Clearance: 71.9 mL/min (by C-G formula based on SCr of 0.64 mg/dL). Liver Function Tests: Recent Labs  Lab 10/04/19 1929 10/05/19 0406  AST 119* 103*  ALT 81* 65*  ALKPHOS 95 78  BILITOT 1.3* 1.6*  PROT 7.9 6.6  ALBUMIN 4.6 3.6   No results for input(s): LIPASE, AMYLASE in the last 168 hours. No results for input(s): AMMONIA in the last 168 hours. Coagulation Profile: Recent Labs  Lab 10/04/19 2242 10/05/19 0406 10/06/19 0524 10/07/19 0434  INR 2.0* 2.3* 1.8* 1.5*   Cardiac Enzymes: No results for input(s): CKTOTAL, CKMB, CKMBINDEX, TROPONINI in the last 168 hours. BNP (last 3 results) No results for input(s): PROBNP in the last 8760 hours. HbA1C: No results for input(s): HGBA1C in the last 72 hours. CBG: No results for input(s): GLUCAP in the last 168 hours. Lipid  Profile: No results for input(s): CHOL, HDL, LDLCALC, TRIG, CHOLHDL, LDLDIRECT in the last 72 hours. Thyroid Function Tests: No results for input(s): TSH, T4TOTAL, FREET4, T3FREE, THYROIDAB in the last 72 hours. Anemia Panel: No results for input(s): VITAMINB12, FOLATE, FERRITIN, TIBC, IRON, RETICCTPCT in the last 72 hours. Sepsis Labs: No results for input(s): PROCALCITON, LATICACIDVEN in the last 168 hours.  Recent Results (from the past 240 hour(s))  Respiratory Panel by RT PCR (Flu A&B, Covid) - Nasopharyngeal Swab     Status: None   Collection Time: 10/05/19  2:25 AM   Specimen: Nasopharyngeal Swab  Result Value Ref Range Status   SARS Coronavirus 2 by RT PCR NEGATIVE NEGATIVE Final    Comment: (NOTE) SARS-CoV-2 target nucleic acids are NOT DETECTED. The SARS-CoV-2 RNA is generally detectable in upper respiratoy specimens during the acute phase of infection. The lowest concentration of SARS-CoV-2 viral copies this assay can detect is 131 copies/mL. A negative result does not preclude SARS-Cov-2 infection and should not be used as the sole basis for treatment or other patient management decisions. A negative result may occur with  improper specimen collection/handling, submission of specimen other than nasopharyngeal swab, presence of viral mutation(s) within the areas targeted by this assay, and inadequate number of viral copies (<131 copies/mL). A negative result must be combined with clinical observations, patient history, and epidemiological information. The expected result is Negative. Fact Sheet for Patients:  PinkCheek.be Fact Sheet for Healthcare Providers:  GravelBags.it This test is not yet ap proved or cleared by the [place] FDA and  has been authorized for detection and/or diagnosis of SARS-CoV-2 by FDA under an Emergency Use Authorization (EUA). This EUA will remain  in effect (meaning this test can be  used) for the duration of the COVID-19 declaration under Section 564(b)(1) of the Act, 21 U.S.C. section 360bbb-3(b)(1), unless the authorization is terminated or revoked sooner.    Influenza A by PCR NEGATIVE NEGATIVE Final   Influenza B by PCR NEGATIVE NEGATIVE Final    Comment: (NOTE) The Xpert Xpress SARS-CoV-2/FLU/RSV assay is intended as an aid in  the diagnosis of influenza from Nasopharyngeal swab specimens and  should not be used as a sole basis for treatment. Nasal washings and  aspirates are unacceptable for Xpert Xpress SARS-CoV-2/FLU/RSV  testing. Fact Sheet for Patients: PinkCheek.be Fact Sheet for Healthcare Providers: GravelBags.it This test is not yet approved or cleared by the [place] FDA and  has been authorized for detection and/or diagnosis of SARS-CoV-2 by  FDA under an Emergency Use Authorization (EUA). This EUA will remain  in effect (meaning this test can be used) for the duration of the  Covid-19 declaration under Section 564(b)(1) of the Act, 21  U.S.C. section 360bbb-3(b)(1), unless the authorization is  terminated or revoked. Performed at [facility], [address],  [place], [place] [address]          Radiology Studies: No results found.      Scheduled Meds: . buPROPion  150 mg Oral Daily  . DULoxetine  60 mg Oral Daily  . feeding supplement (ENSURE ENLIVE)  237 mL Oral BID BM  . folic acid  1 mg Oral Daily  . LORazepam  0-4 mg Intravenous Q12H   Or  . LORazepam  0-4 mg Oral Q12H  . LORazepam  0-4 mg Intravenous Q8H  . multivitamin with minerals  1 tablet Oral Daily  . pantoprazole  40 mg Oral Daily  . thiamine  100 mg Oral Daily   Or  . thiamine  100 mg Intravenous Daily  . warfarin  4 mg Oral ONCE-1800  . Warfarin - Pharmacist Dosing Inpatient   Does not apply q1800   Continuous Infusions:   LOS: 2 days    Time spent: 33 mins    Wyvonnia Dusky, MD [facility] Pager [phone]  If 7PM-7AM, please contact night-coverage [email or web address] 10/07/2019, 8:30 AM

## 2019-10-07 NOTE — TOC Progression Note (Signed)
Transition of Care [facility]) - Progression Note    Patient Details  Name: Tina Sharp MRN: [id number] Date of Birth: 13-Aug-1966  Transition of [facility]) CM/SW Contact  Su Hilt, RN Phone Number: 10/07/2019, 3:22 PM  Clinical Narrative:   Reviewed the bed offers with the patient  And she chose to go to [facility], I called [facility] to determine if they manage the insurance or if it is [facility], [facility] confirmed that it is managed by Mcarth[facility] Rossetti, I notified Tina at [facility], They are starting the auth process    Expected Discharge Plan: [place] Barriers to Discharge: Continued Medical Work up  Expected Discharge Plan and Services Expected Discharge Plan: [place]   Discharge Planning Services: CM Consult   Living arrangements for the past 2 months: Single Family Home                 DME Arranged: N/A         HH Arranged: PT, OT, Nurse's Aide [place] Agency: [facility] [employer] [facility] [employer] Date [place]: 10/06/19 Time [place]: [place] Representative spoke with at [place]: [place] ([place]) Interventions    Readmission Risk Interventions No flowsheet data found.

## 2019-10-07 NOTE — NC FL2 (Signed)
[place] LEVEL OF CARE SCREENING TOOL     IDENTIFICATION  Patient Name: Tina Sharp Birthdate: [birth year] Sex: female Admission Date (Current Location): 10/04/2019  [place] and [place] Number:  Engineering geologist and Address:  [facility], [address] [place], [place] [address]      Provider Number: [id number]  Attending Physician Name and Address:  Wyvonnia Dusky, MD  Relative Name and Phone Number:  Jinny Blossom Daughter [phone]    Current Level of Care: Hospital Recommended Level of Care: [place] Prior Approval Number:    Date Approved/Denied:   PASRR Number: [id number]  Discharge Plan: SNF    Current Diagnoses: Patient Active Problem List   Diagnosis Date Noted  . 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 Hyponatremia 10/05/2019  . Alcoholic liver disease ([place]) 10/05/2019  . Chronic anticoagulation 10/05/2019  . History of breast cancer 10/05/2019  . Alcohol withdrawal delirium, acute, mixed level of activity ([place]) 10/05/2019  . Alcohol withdrawal syndrome, uncomplicated ([place]) [id number]  . Fall 09/12/2018  . Lymphedema 04/30/2018  . Hypertension 01/14/2018  . Leg swelling 01/14/2018  . Breast calcifications on mammogram 06/24/2017  . Hip fracture ([place]) 07/30/2016  . 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 Rotator cuff tendinitis, left 06/15/2016  . Neuropathy 01/03/2016  . Ataxia 01/03/2016  . Myalgia 09/08/2015  . Depression 09/06/2015  . Cerebrovascular accident (CVA) due to thrombosis of posterior cerebral artery ([place]) 08/18/2015  . 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 Stroke with cerebral ischemia ([place]) 07/12/2015  . HLD (hyperlipidemia) 07/12/2015  . CVA (cerebral infarction) 06/21/2015  . TIA (transient ischemic attack) 06/21/2015  . Macrocytic anemia 06/20/2015  . Headache 06/14/2015  . Cigarette smoker 06/14/2015   . Stroke (cerebrum) ([place]) cryptogenic R brain infarcts s/p IV tPA  06/11/2015  . 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, estrogen receptor positive ([place]) 04/23/2014    Orientation RESPIRATION BLADDER Height & Weight     Self, Time, Situation, Place    Continent Weight: 68 kg Height:  5\' 2"  (157.5 cm)  BEHAVIORAL SYMPTOMS/MOOD NEUROLOGICAL BOWEL NUTRITION STATUS      Continent Diet(Regular)  AMBULATORY STATUS COMMUNICATION OF NEEDS Skin   Extensive Assist   Normal                       Personal Care Assistance Level of Assistance  Dressing, Bathing Bathing Assistance: Limited assistance   Dressing Assistance: Limited assistance     Functional Limitations Info             SPECIAL CARE FACTORS FREQUENCY  PT (By licensed PT), OT (By licensed OT)     PT Frequency: 5 days a week OT Frequency: 5 days a week            Contractures Contractures Info: Not present    Additional Factors Info  Code Status, Allergies Code Status Info: Full Code Allergies Info: NKDA           Current Medications (10/07/2019):  This is the current hospital active medication list Current Facility-Administered Medications  Medication Dose Route Frequency Provider Last Rate Last Admin  . acetaminophen (TYLENOL) tablet 650 mg  650 mg Oral Q6H PRN Athena Masse, MD       Or  . acetaminophen (TYLENOL) suppository 650 mg  650 mg Rectal Q6H PRN Athena Masse, MD      . buPROPion (WELLBUTRIN XL)  24 hr tablet 150 mg  150 mg Oral Daily Nolberto Hanlon, MD   150 mg at 10/07/19 0910  . DULoxetine (CYMBALTA) DR capsule 60 mg  60 mg Oral Daily Nolberto Hanlon, MD   60 mg at 10/07/19 0911  . enoxaparin (LOVENOX) injection 40 mg  40 mg Subcutaneous Q24H Wyvonnia Dusky, MD   40 mg at 10/07/19 0912  . feeding supplement (ENSURE ENLIVE) (ENSURE ENLIVE) liquid 237 mL  237 mL Oral BID BM Nolberto Hanlon, MD   237 mL at 10/07/19 0943  . folic acid (FOLVITE) tablet 1 mg  1 mg Oral Daily  Athena Masse, MD   1 mg at 10/07/19 0910  . LORazepam (ATIVAN) injection 0-4 mg  0-4 mg Intravenous Q12H Gregor Hams, MD       Or  . LORazepam (ATIVAN) tablet 0-4 mg  0-4 mg Oral Q12H Gregor Hams, MD      . LORazepam (ATIVAN) injection 0-4 mg  0-4 mg Intravenous Q8H Judd Gaudier V, MD      . LORazepam (ATIVAN) tablet 1-4 mg  1-4 mg Oral Q1H PRN Athena Masse, MD   2 mg at 10/07/19 0910   Or  . LORazepam (ATIVAN) injection 1-4 mg  1-4 mg Intravenous Q1H PRN Athena Masse, MD   2 mg at 10/07/19 0506  . multivitamin with minerals tablet 1 tablet  1 tablet Oral Daily Athena Masse, MD   1 tablet at 10/07/19 [phone]  . ondansetron (ZOFRAN) tablet 4 mg  4 mg Oral Q6H PRN Athena Masse, MD       Or  . ondansetron [facility]) injection 4 mg  4 mg Intravenous Q6H PRN Athena Masse, MD      . pantoprazole (PROTONIX) EC tablet 40 mg  40 mg Oral Daily Nolberto Hanlon, MD   40 mg at 10/07/19 0910  . senna-docusate (Senokot-S) tablet 1 tablet  1 tablet Oral QHS PRN Athena Masse, MD      . thiamine tablet 100 mg  100 mg Oral Daily Gregor Hams, MD   100 mg at 10/07/19 [id number]   Or  . thiamine (B-1) injection 100 mg  100 mg Intravenous Daily Gregor Hams, MD      . traMADol Veatrice Bourbon) tablet 50 mg  50 mg Oral Q6H PRN Wyvonnia Dusky, MD   50 mg at 10/07/19 0943  . warfarin (COUMADIN) tablet 4 mg  4 mg Oral ONCE-1800 Hallaji, Sheema M, RPH      . Warfarin - Pharmacist Dosing Inpatient   Does not apply q1800 Pernell Dupre, [facility]         Discharge Medications: Please see discharge summary for a list of discharge medications.  Relevant Imaging Results:  Relevant Lab Results:   Additional Information SS#[id number]  Su Hilt, RN

## 2019-10-07 NOTE — Plan of Care (Signed)

## 2019-10-07 NOTE — Progress Notes (Signed)
Physical Therapy Treatment Patient Details Name: Tina Sharp MRN: [id number] DOB: [birth year] Today's Date: 10/07/2019    History of Present Illness Pt admitted for alcohol withdrawl syndrome with complaints of weakness during ambulation. History includes CVA with L side weakness, breast cancer s/p mastectomy on L side, HTN, and anxiety.    PT Comments    Pt is making greatly improved progress this date and requires less assistance for physical mobility. Pt with less anxiety and improved control of extremities with no tremors this date. Agreeable to therapy and able to ambulate increased distance this session using RW. Does tend to drift to R and has difficulty with obstacle avoidance. Will continue to progress as able. Due to progress, will update recommendation to HHPT at this time.   Follow Up Recommendations  Home health PT     Equipment Recommendations  None recommended by PT    Recommendations for Other Services       Precautions / Restrictions Precautions Precautions: Fall;Other (comment) Restrictions Weight Bearing Restrictions: No    Mobility  Bed Mobility Overal bed mobility: Needs Assistance Bed Mobility: Supine to Sit     Supine to sit: Mod assist     General bed mobility comments: needs assist for trunk support and scooting out toward EOB. Once seated at EOB, able to sit with upright posture  Transfers Overall transfer level: Needs assistance Equipment used: Rolling walker (2 wheeled) Transfers: Sit to/from Stand Sit to Stand: Min assist         General transfer comment: improved technique with pt able to push from seated surface. Improved posture during static standing this date  Ambulation/Gait Ambulation/Gait assistance: Min guard Gait Distance (Feet): 80 Feet Assistive device: Rolling walker (2 wheeled) Gait Pattern/deviations: Step-through pattern     General Gait Details: able to ambulate several laps in room. Has difficulty with  sequencing RW and tends to "bump" into items on R side. Requires cues for obstacle avoidance. Fatigues with increased distance   Stairs             Wheelchair Mobility    Modified Rankin (Stroke Patients Only)       Balance Overall balance assessment: Needs assistance;History of Falls Sitting-balance support: Single extremity supported;Feet unsupported Sitting balance-Leahy Scale: Good     Standing balance support: Bilateral upper extremity supported Standing balance-Leahy Scale: Fair Standing balance comment: Improved upright posture, however generally unsteady                            Cognition Arousal/Alertness: Awake/alert Behavior During Therapy: WFL for tasks assessed/performed Overall Cognitive Status: Within Functional Limits for tasks assessed                                        Exercises Other Exercises Other Exercises: Supine ther-ex performed on B LE including AP, quad sets, SLRs, hip abd/add, and SAQ. All ther-ex performed x 12 reps with supervision    General Comments        Pertinent Vitals/Pain Pain Assessment: No/denies pain    Home Living                      Prior Function            PT Goals (current goals can now be found in the care plan section) Acute Rehab PT Goals Patient  Stated Goal: to go home PT Goal Formulation: With patient Time For Goal Achievement: 10/20/19 Potential to Achieve Goals: Good Progress towards PT goals: Progressing toward goals    Frequency    Min 2X/week      PT Plan Discharge plan needs to be updated    Co-evaluation              AM-PAC PT "6 Clicks" Mobility   Outcome Measure  Help needed turning from your back to your side while in a flat bed without using bedrails?: A Little Help needed moving from lying on your back to sitting on the side of a flat bed without using bedrails?: A Little Help needed moving to and from a bed to a chair (including  a wheelchair)?: A Little Help needed standing up from a chair using your arms (e.g., wheelchair or bedside chair)?: A Little Help needed to walk in hospital room?: A Little Help needed climbing 3-5 steps with a railing? : A Lot 6 Click Score: 17    End of Session Equipment Utilized During Treatment: Gait belt Activity Tolerance: Patient tolerated treatment well Patient left: in chair;with chair alarm set Nurse Communication: Mobility status PT Visit Diagnosis: Unsteadiness on feet (R26.81);Repeated falls (R29.6);Muscle weakness (generalized) (M62.81);History of falling (Z91.81);Difficulty in walking, not elsewhere classified (R26.2)     Time: [id number] PT Time Calculation (min) (ACUTE ONLY): 28 min  Charges:  $Gait Training: 8-22 mins $Therapeutic Exercise: 8-22 mins                     Greggory Stallion, PT, DPT [phone]    Tina Sharp 10/07/2019, 12:38 PM

## 2019-10-07 NOTE — Progress Notes (Signed)
Required 2 doses of Ativan tonight. Incontinent of urine, overflowing purewick. Educated patient about calling for help when need to void arises. She agrees and demonstrated how to use nurse call button. Tried to help patient stand up, but too weak.

## 2019-10-08 LAB — COMPREHENSIVE METABOLIC PANEL
ALT: 66 U/L — ABNORMAL HIGH (ref 0–44)
AST: 60 U/L — ABNORMAL HIGH (ref 15–41)
Albumin: 3.3 g/dL — ABNORMAL LOW (ref 3.5–5.0)
Alkaline Phosphatase: 71 U/L (ref 38–126)
Anion gap: 7 (ref 5–15)
BUN: 11 mg/dL (ref 6–20)
CO2: 29 mmol/L (ref 22–32)
Calcium: 8.8 mg/dL — ABNORMAL LOW (ref 8.9–10.3)
Chloride: 101 mmol/L (ref 98–111)
Creatinine, Ser: 0.68 mg/dL (ref 0.44–1.00)
GFR calc Af Amer: >60 mL/min (ref >60)
GFR calc non Af Amer: >60 mL/min (ref >60)
Glucose, Bld: 92 mg/dL (ref 70–99)
Potassium: 4.2 mmol/L (ref 3.5–5.1)
Sodium: 137 mmol/L (ref 135–145)
Total Bilirubin: 0.7 mg/dL (ref 0.3–1.2)
Total Protein: 6.4 g/dL — ABNORMAL LOW (ref 6.5–8.1)

## 2019-10-08 LAB — CBC
HCT: 37.8 % (ref 36.0–46.0)
Hemoglobin: 12.4 g/dL (ref 12.0–15.0)
MCH: 32.5 pg (ref 26.0–34.0)
MCHC: 32.8 g/dL (ref 30.0–36.0)
MCV: 99 fL (ref 80.0–100.0)
Platelets: 157 10*3/uL (ref 150–400)
RBC: 3.82 MIL/uL — ABNORMAL LOW (ref 3.87–5.11)
RDW: 13.5 % (ref 11.5–15.5)
WBC: 4.8 10*3/uL (ref 4.0–10.5)
nRBC: 0 % (ref 0.0–0.2)

## 2019-10-08 LAB — PROTIME-INR
INR: 1.3 — ABNORMAL HIGH (ref 0.8–1.2)
Prothrombin Time: 15.7 seconds — ABNORMAL HIGH (ref 11.4–15.2)

## 2019-10-08 MED ORDER — WARFARIN SODIUM 6 MG PO TABS
6.0000 mg | ORAL_TABLET | Freq: Once | ORAL | Status: AC
  Administered 2019-10-08: 18:00:00 6 mg via ORAL
  Filled 2019-10-08: qty 1

## 2019-10-08 NOTE — Care Management Important Message (Signed)
Important Message  Patient Details  Name: FARRAH WITHERITE MRN: [id number] Date of Birth: 22-Dec-1965   Medicare Important Message Given:  Yes     Juliann Pulse A Maayan Jenning 10/08/2019, 10:47 AM

## 2019-10-08 NOTE — Progress Notes (Signed)
Ch visited with Pt in response to [facility] about patient requesting someone to talk to. Upon arrival Pt was sad and emotional. Pt reported that she lost her mother on Dec 15th, and after every bad break, she could not handle it anymore, and wound up drinking too much last week. Pt said " One year it was breast cancer, the next year it was a stroke, the next year was multiple strokes, the next year I broke my hip like an old woman, the next year I broke my leg in three, it just never stops." Pt expressed concerns over completing insurance things and collecting money for her mother. Pt reported that her Jinny Blossom, her 35 year old daughter is helping her with it. Pt is very emotional, and tearful over being lonely and helpless. Pt reported that loud talking and laughing outside the room made her feel like being thrown into a corner in a banquet hall, while everyone else was having a great party. Ch engaged with Pt for a while. Pt reported that she does not want to go to [facility] even though they are great; she just wants to go home "to be near mom." Ch requested prayer "to be out of the hole." Ch prayed with Pt, let Pt know about Ch availability, and left.    10/08/19 1800  Clinical Encounter Type  Visited With Patient  Visit Type Initial  Referral From Patient  Consult/Referral To Chaplain  Spiritual Encounters  Spiritual Needs Prayer;Emotional  Stress Factors  Patient Stress Factors Exhausted;Family relationships;Health changes;Loss;Loss of control;Major life changes  Family Stress Factors Not reviewed

## 2019-10-08 NOTE — Progress Notes (Signed)
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 Seizure fall and aspiration precautions. Alcohol cessation counseling. Pt's mother passed away in 2019/08/10.  Continue on thiamine, folate, & MVI  Hyponatremia: Resolved  Transaminitis: likely secondary to alcohol abuse. Will continue to monitor   Hx of cryptogenic CVA: with residual left hemiparesis. Hx of hyperhomocystinemia & on coumadin.  CT shows no acute findings but evidence of old MCA territory infarct. Continue to monitor INR daily. INR is subtherapeutic    History of breast cancer: s/p left mastectomy. Pt is not taking anastrozole   Depression: severity unknown.Continue bupropion, clonazepam, duloxetine. Recommend pt see a therapist as an outpatient     DVT prophylaxis: lovenox, coumadin  Code Status: full Family Communication:  Disposition Plan: PT initially recs SNF but now [facility]. HH orders placed. Will likely d/c in 24 hours as pt clinically improves   Consultants:      Procedures:    Antimicrobials: n/a   Subjective: Pt c/o back pain still  Objective: Vitals:   10/07/19 0745 10/07/19 1153 10/08/19 0016 10/08/19 0808  BP: 130/83 121/69 131/77 (!) 149/85  Pulse: 96 94 90 (!) 102  Resp: 14  16   Temp: 98.5 F (36.9 C) 98 F (36.7 C) 98.3 F (36.8 C) 98 F (36.7 C)  TempSrc: Oral Oral Oral Oral  SpO2: 96% 94% 95% 97%  Weight:      Height:         Intake/Output Summary (Last 24 hours) at 10/08/2019 0828 Last data filed at 10/08/2019 0513 Gross per 24 hour  Intake 260 ml  Output 650 ml  Net -390 ml   Filed Weights   10/04/19 1921 10/05/19 0342  Weight: 68 kg 68 kg    Examination:  General exam: Appears tearful and anxious Respiratory system: decreased breath sounds b/l. No wheezes, rales  Cardiovascular system: S1 & S2 +. No rubs, gallops or clicks.  Gastrointestinal system: Abdomen is nondistended, soft and nontender. Normal bowel sounds heard. Central nervous system: Alert and oriented. Moves all 4 extremities  Psychiatry: Judgement and insight appear normal. Tearful.     Data Reviewed: I have personally reviewed following labs and imaging studies  CBC: Recent Labs  Lab 10/04/19 1929 10/05/19 0406 10/08/19 0546  WBC 7.0 5.8 4.8  HGB 15.3* 13.1 12.4  HCT 44.8 37.6 37.8  MCV 95.7 94.9 99.0  PLT 269 204 [id number]   Basic Metabolic Panel: Recent Labs  Lab 10/04/19 1929 10/05/19 0406 10/06/19 0524 10/08/19 0546  NA 131* 135 136 137  K 4.1 3.8 3.5 4.2  CL 90* 97* 99 101  CO2 22 26 29 29   GLUCOSE 88 86 114* 92  BUN 7 7 9 11   CREATININE 0.64 0.69 0.64 0.68  CALCIUM 8.6* 7.7* 8.3* 8.8*  MG  --  1.9  --   --   PHOS  --  2.4*  --   --    GFR: Estimated Creatinine Clearance: 71.9 mL/min (  by C-G formula based on SCr of 0.68 mg/dL). Liver Function Tests: Recent Labs  Lab 10/04/19 1929 10/05/19 0406 10/08/19 0546  AST 119* 103* 60*  ALT 81* 65* 66*  ALKPHOS 95 78 71  BILITOT 1.3* 1.6* 0.7  PROT 7.9 6.6 6.4*  ALBUMIN 4.6 3.6 3.3*   No results for input(s): LIPASE, AMYLASE in the last 168 hours. No results for input(s): AMMONIA in the last 168 hours. Coagulation Profile: Recent Labs  Lab 10/04/19 2242 10/05/19 0406 10/06/19 0524 10/07/19 0434 10/08/19 0546  INR 2.0* 2.3* 1.8* 1.5* 1.3*   Cardiac Enzymes: No results for input(s): CKTOTAL, CKMB, CKMBINDEX, TROPONINI in the last 168 hours. BNP  (last 3 results) No results for input(s): PROBNP in the last 8760 hours. HbA1C: No results for input(s): HGBA1C in the last 72 hours. CBG: No results for input(s): GLUCAP in the last 168 hours. Lipid Profile: No results for input(s): CHOL, HDL, LDLCALC, TRIG, CHOLHDL, LDLDIRECT in the last 72 hours. Thyroid Function Tests: No results for input(s): TSH, T4TOTAL, FREET4, T3FREE, THYROIDAB in the last 72 hours. Anemia Panel: No results for input(s): VITAMINB12, FOLATE, FERRITIN, TIBC, IRON, RETICCTPCT in the last 72 hours. Sepsis Labs: No results for input(s): PROCALCITON, LATICACIDVEN in the last 168 hours.  Recent Results (from the past 240 hour(s))  Respiratory Panel by RT PCR (Flu A&B, Covid) - Nasopharyngeal Swab     Status: None   Collection Time: 10/05/19  2:25 AM   Specimen: Nasopharyngeal Swab  Result Value Ref Range Status   SARS Coronavirus 2 by RT PCR NEGATIVE NEGATIVE Final    Comment: (NOTE) SARS-CoV-2 target nucleic acids are NOT DETECTED. The SARS-CoV-2 RNA is generally detectable in upper respiratoy specimens during the acute phase of infection. The lowest concentration of SARS-CoV-2 viral copies this assay can detect is 131 copies/mL. A negative result does not preclude SARS-Cov-2 infection and should not be used as the sole basis for treatment or other patient management decisions. A negative result may occur with  improper specimen collection/handling, submission of specimen other than nasopharyngeal swab, presence of viral mutation(s) within the areas targeted by this assay, and inadequate number of viral copies (<131 copies/mL). A negative result must be combined with clinical observations, patient history, and epidemiological information. The expected result is Negative. Fact Sheet for Patients:  PinkCheek.be Fact Sheet for Healthcare Providers:  GravelBags.it This test is not yet ap proved or cleared  by the [place] FDA and  has been authorized for detection and/or diagnosis of SARS-CoV-2 by FDA under an Emergency Use Authorization (EUA). This EUA will remain  in effect (meaning this test can be used) for the duration of the COVID-19 declaration under Section 564(b)(1) of the Act, 21 U.S.C. section 360bbb-3(b)(1), unless the authorization is terminated or revoked sooner.    Influenza A by PCR NEGATIVE NEGATIVE Final   Influenza B by PCR NEGATIVE NEGATIVE Final    Comment: (NOTE) The Xpert Xpress SARS-CoV-2/FLU/RSV assay is intended as an aid in  the diagnosis of influenza from Nasopharyngeal swab specimens and  should not be used as a sole basis for treatment. Nasal washings and  aspirates are unacceptable for Xpert Xpress SARS-CoV-2/FLU/RSV  testing. Fact Sheet for Patients: PinkCheek.be Fact Sheet for Healthcare Providers: GravelBags.it This test is not yet approved or cleared by the [place] FDA and  has been authorized for detection and/or diagnosis of SARS-CoV-2 by  FDA under an Emergency Use Authorization (EUA). This EUA will remain  in effect (meaning this  test can be used) for the duration of the  Covid-19 declaration under Section 564(b)(1) of the Act, 21  U.S.C. section 360bbb-3(b)(1), unless the authorization is  terminated or revoked. Performed at [facility], [address] [place], [place] [address]          Radiology Studies: No results found.      Scheduled Meds: . buPROPion  150 mg Oral Daily  . DULoxetine  60 mg Oral Daily  . enoxaparin (LOVENOX) injection  40 mg Subcutaneous Q24H  . feeding supplement (ENSURE ENLIVE)  237 mL Oral BID BM  . folic acid  1 mg Oral Daily  . LORazepam  0-4 mg Intravenous Q12H   Or  . LORazepam  0-4 mg Oral Q12H  . LORazepam  0-4 mg Intravenous Q8H  . multivitamin with minerals  1 tablet Oral Daily  . pantoprazole  40 mg Oral Daily  .  thiamine  100 mg Oral Daily   Or  . thiamine  100 mg Intravenous Daily  . Warfarin - Pharmacist Dosing Inpatient   Does not apply q1800   Continuous Infusions:   LOS: 3 days    Time spent: 32 mins    Wyvonnia Dusky, MD [facility] Pager [phone]  If 7PM-7AM, please contact night-coverage [email or web address] 10/08/2019, 8:28 AM

## 2019-10-08 NOTE — Progress Notes (Signed)
Occupational Therapy Treatment Patient Details Name: Tina Sharp MRN: [id number] DOB: [birth year] Today's Date: 10/08/2019    History of present illness Pt admitted for alcohol withdrawl syndrome with complaints of weakness during ambulation. History includes CVA with L side weakness, breast cancer s/p mastectomy on L side, HTN, and anxiety.   OT comments  Met with patient to discuss adaptations for ADLs at home to compensate for decreased strength in BUE and hands with decreased coordination and control.  Also reviewed theraputty exercises to follow since she has putty at home but no written plan.  Reviewed items in [place] for writing skills, 2 handled cups, suction bowl, rocker knife and elastic or magnetic shoe closures.  Pt is making a lot of progress with stamina for self care and functional mobility and discharge plan changed from SNF to [facility] OT which patient agreed with.    Follow Up Recommendations  Home health OT    Equipment Recommendations       Recommendations for Other Services      Precautions / Restrictions Precautions Precautions: Fall Restrictions Weight Bearing Restrictions: No       Mobility Bed Mobility Overal bed mobility: Needs Assistance Bed Mobility: Supine to Sit     Supine to sit: Min assist;HOB elevated        Transfers Overall transfer level: Needs assistance Equipment used: Rolling walker (2 wheeled) Transfers: Sit to/from Stand Sit to Stand: Min guard              Balance Overall balance assessment: Needs assistance;History of Falls Sitting-balance support: Single extremity supported;Feet unsupported Sitting balance-Leahy Scale: Good     Standing balance support: Bilateral upper extremity supported Standing balance-Leahy Scale: Fair                             ADL either performed or assessed with clinical judgement   ADL Overall ADL's : Needs assistance/impaired                                        General ADL Comments: Met with patient to discuss adaptations for ADLs at home to compensate for decreased strength in BUE and hands with decreased coordination and control.  Also reviewed theraputty exercises to follow since she has putty at home but no written plan.  Reviewed items in [place] for writing skills, 2 handled cups, suction bowl, rocker knife and elastic or magnetic shoe closures.     Vision Baseline Vision/History: Wears glasses Wears Glasses: Reading only Patient Visual Report: Blurring of vision     Perception     Praxis      Cognition Arousal/Alertness: Awake/alert Behavior During Therapy: WFL for tasks assessed/performed Overall Cognitive Status: Within Functional Limits for tasks assessed                                 General Comments: Pt emotional        Exercises Other Exercises Other Exercises: standing hip extension and then standing hip abduction with RW x15 ea leg, seated LAQ x15   Shoulder Instructions       General Comments      Pertinent Vitals/ Pain       Pain Assessment: No/denies pain  Home Living  Prior Functioning/Environment              Frequency  Min 2X/week        Progress Toward Goals  OT Goals(current goals can now be found in the care plan section)  Progress towards OT goals: Progressing toward goals  Acute Rehab OT Goals Patient Stated Goal: to go home OT Goal Formulation: With patient Time For Goal Achievement: 10/20/19 Potential to Achieve Goals: Good  Plan Discharge plan needs to be updated    Co-evaluation                 AM-PAC OT "6 Clicks" Daily Activity     Outcome Measure   Help from another person eating meals?: A Little Help from another person taking care of personal grooming?: A Little Help from another person toileting, which includes using toliet, bedpan, or urinal?: A  Little Help from another person bathing (including washing, rinsing, drying)?: A Little Help from another person to put on and taking off regular upper body clothing?: A Little Help from another person to put on and taking off regular lower body clothing?: A Little 6 Click Score: 18    End of Session    OT Visit Diagnosis: Unsteadiness on feet (R26.81);Repeated falls (R29.6)   Activity Tolerance Patient tolerated treatment well   Patient Left in chair;with call bell/phone within reach;with chair alarm set   Nurse Communication          Time: 1140-1210 OT Time Calculation (min): 30 min  Charges: OT General Charges $OT Visit: 1 Visit OT Treatments $Self Care/Home Management : 8-22 mins $Therapeutic Exercise: 8-22 mins  Chrys Racer, OTR/L, [place] ascom [phone] 10/08/19, 12:23 PM

## 2019-10-08 NOTE — Progress Notes (Signed)
Physical Therapy Treatment Patient Details Name: FARREL SISEMORE MRN: [id number] DOB: 1965-11-27 Today's Date: 10/08/2019    History of Present Illness Pt admitted for alcohol withdrawl syndrome with complaints of weakness during ambulation. History includes CVA with L side weakness, breast cancer s/p mastectomy on L side, HTN, and anxiety.    PT Comments    Pt alert, agreeable to PT. Pt was emotional during PT session; expressed fatigue, as well as being anxious and stressed out over her mother's death and handling her affairs. PT provided active listening and support, also offered chaplain services, pt agreeable and RN informed. The patient demonstrated progression towards goals this session. Supine to sit with minA, sit <> stand with RW and CGA. Pt exhibited increased tremors with fatigue and still needed occasional help with navigating obstacles during ambulation. She ambulated ~26ft with RW and CGA as well as several standing exercises. RN entered room, further intervention deferred. Of noted pt HR in 130s-low 140s with ambulation, RN notified. The patient would benefit from further skilled PT intervention to maximize mobility, safety, and independence, recommendation is HHPT.     Follow Up Recommendations  Home health PT     Equipment Recommendations  None recommended by PT    Recommendations for Other Services       Precautions / Restrictions Precautions Precautions: Fall Restrictions Weight Bearing Restrictions: No    Mobility  Bed Mobility Overal bed mobility: Needs Assistance Bed Mobility: Supine to Sit     Supine to sit: Min assist;HOB elevated        Transfers Overall transfer level: Needs assistance Equipment used: Rolling walker (2 wheeled) Transfers: Sit to/from Stand Sit to Stand: Min guard            Ambulation/Gait Ambulation/Gait assistance: Min guard Gait Distance (Feet): 80 Feet Assistive device: Rolling walker (2 wheeled) Gait  Pattern/deviations: Step-through pattern Gait velocity: decreased   General Gait Details: Pt able to ambulate out into hall today, with fatigue tremors increased, HR in 130s-low 140s by end of ambulation.   Stairs             Wheelchair Mobility    Modified Rankin (Stroke Patients Only)       Balance Overall balance assessment: Needs assistance;History of Falls Sitting-balance support: Single extremity supported;Feet unsupported Sitting balance-Leahy Scale: Good     Standing balance support: Bilateral upper extremity supported Standing balance-Leahy Scale: Fair                              Cognition Arousal/Alertness: Awake/alert Behavior During Therapy: WFL for tasks assessed/performed Overall Cognitive Status: Within Functional Limits for tasks assessed                                 General Comments: Pt emotional      Exercises Other Exercises Other Exercises: standing hip extension and then standing hip abduction with RW x15 ea leg, seated LAQ x15    General Comments        Pertinent Vitals/Pain Pain Assessment: No/denies pain    Home Living                      Prior Function            PT Goals (current goals can now be found in the care plan section) Progress towards PT goals: Progressing toward goals  Frequency    Min 2X/week      PT Plan Current plan remains appropriate    Co-evaluation              AM-PAC PT "6 Clicks" Mobility   Outcome Measure  Help needed turning from your back to your side while in a flat bed without using bedrails?: A Little Help needed moving from lying on your back to sitting on the side of a flat bed without using bedrails?: A Little Help needed moving to and from a bed to a chair (including a wheelchair)?: A Little Help needed standing up from a chair using your arms (e.g., wheelchair or bedside chair)?: A Little Help needed to walk in hospital room?: A  Little Help needed climbing 3-5 steps with a railing? : A Lot 6 Click Score: 17    End of Session Equipment Utilized During Treatment: Gait belt Activity Tolerance: Patient tolerated treatment well Patient left: in chair;with chair alarm set;with family/visitor present;with call bell/phone within reach Nurse Communication: Mobility status PT Visit Diagnosis: Unsteadiness on feet (R26.81);Repeated falls (R29.6);Muscle weakness (generalized) (M62.81);History of falling (Z91.81);Difficulty in walking, not elsewhere classified (R26.2)     Time: [id number] PT Time Calculation (min) (ACUTE ONLY): 30 min  Charges:  $Therapeutic Exercise: 23-37 mins                     Lieutenant Diego PT, DPT 11:52 AM,10/08/19

## 2019-10-08 NOTE — Progress Notes (Signed)
ANTICOAGULATION CONSULT NOTE - Initial Consult  Pharmacy Consult for Warfarin (home med) Indication: stroke  No Known Allergies  Patient Measurements: Height: 5\' 2"  (157.5 cm) Weight: 149 lb 14.6 oz (68 kg) IBW/kg (Calculated) : 50.1  Vital Signs: Temp: 98 F (36.7 C) (02/25 0808) Temp Source: Oral (02/25 0808) BP: 149/85 (02/25 0808) Pulse Rate: 102 (02/25 0808)  Labs: Recent Labs    10/06/19 0524 10/07/19 0434 10/08/19 0546  HGB  --   --  12.4  HCT  --   --  37.8  PLT  --   --  157  LABPROT 21.1* 18.4* 15.7*  INR 1.8* 1.5* 1.3*  CREATININE 0.64  --  0.68   Estimated Creatinine Clearance: 71.9 mL/min (by C-G formula based on SCr of 0.68 mg/dL).  Medical History: Past Medical History:  Diagnosis Date  . Anxiety   . Breast cancer ([place]) 01/2014   Invasive lobular carcinoma, 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 ER/ PR+; Her 2 neu not overexpressed, microscopic positive margin (skin). Oncotype DX, low risk for recurrence.  . Depression   . Genetic screening 05/05/2014   negative /[facility]  . GERD (gastroesophageal reflux disease)   . Hypertension   . 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 atorvastatin (LIPITOR) 40 MG tablet Take 1 tablet (40 mg total) by mouth daily at 6 PM. 30 tablet 2 24+ hours at Unknown  . baclofen (LIORESAL) 10 MG tablet Take 10 mg by mouth 3 (three) times daily as needed for muscle spasms.    Past Week at PRN  . buPROPion (WELLBUTRIN XL) 150 MG 24 hr tablet Take 150 mg by mouth daily.   24+ hours at Unknown  . clonazePAM (KLONOPIN) 0.5 MG tablet Take 1 tablet (0.5 mg total) by mouth 2 (two) times daily as needed for anxiety. 4 tablet 0 Unknown at PRN  . cyanocobalamin 1000 MCG tablet Take 1 tablet (1,000 mcg total) by mouth daily. 30 tablet 2   . DULoxetine (CYMBALTA) 60 MG capsule Take 60 mg by mouth daily.   24+ hours  at Unknown  . gabapentin (NEURONTIN) 600 MG tablet Take 600 mg by mouth 4 (four) times daily.   24+ hours at Unknown  . metoprolol succinate (TOPROL-XL) 25 MG 24 hr tablet Take 25 mg by mouth daily.   24+ hours at Unknown  . omeprazole (PRILOSEC) 20 MG capsule Take 20 mg by mouth as needed (heart burn).    Unknown at PRN  . potassium chloride (K-DUR) 10 MEQ tablet Take 10 mEq by mouth daily.   24+ hours at Unknown  . warfarin (COUMADIN) 1 MG tablet Take 2 mg by mouth every evening.    24+ hours at Unknown  . anastrozole (ARIMIDEX) 1 MG tablet Take 1 tablet (1 mg total) by mouth every evening. (Patient not taking: Reported on 10/05/2019) 7 tablet 0 Not Taking at Unknown time    Assessment: Tina Sharp is a 54 y.o. female with hx of breast cancer, CVA (with some mild residual left hand weakness), alcohol use who presents for generalized weakness, concern for dehydration, and shakiness.  Pt on chronic Warfarin for h/o stroke, INR therapeutic on presentation.  Pt reportedly compliant on Warfarin Rx.  Home Regimen: Warfarin 2mg  daily   DATE INR DOSE  2/21 2.0  ---- 2/22 2.3 2mg  2/23 1.8 2.5mg  2/24 1.5 4mg  2/25 1.3  Goal of Therapy:  INR 2-3 Monitor platelets by anticoagulation protocol: Yes   Plan:  2/25 INR subtherapeutic @ 1.3 and trending down. Will order Warfarin 6 mg x 1 dose tonight. Enoxaparin 40mg  daily ordered while INR subtherapeutic.    Will recheck INR in AM and continue to order warfarin based on lab results.    Pernell Dupre, PharmD, BCPS Clinical Pharmacist 10/08/2019 10:46 AM

## 2019-10-08 NOTE — TOC Progression Note (Signed)
Transition of Care [facility]) - Progression Note    Patient Details  Name: Tina Sharp MRN: [id number] Date of Birth: 1966/04/18  Transition of [facility]) CM/SW Contact  Su Hilt, RN Phone Number: 10/08/2019, 11:45 AM  Clinical Narrative:   Patient is doing better and will need HH, I notified Kyrielle with [facility] that the patient will now need [facility] services     Expected Discharge Plan: [place] Barriers to Discharge: Continued Medical Work up  Expected Discharge Plan and Services Expected Discharge Plan: [place]   Discharge Planning Services: CM Consult   Living arrangements for the past 2 months: Single Family Home                 DME Arranged: N/A         HH Arranged: PT, OT, Nurse's Aide [place] Agency: [facility] [employer] [facility] [employer] Date [place]: 10/06/19 Time [place]: [place] Representative spoke with at [place]: [place] ([place]) Interventions    Readmission Risk Interventions No flowsheet data found.

## 2019-10-09 LAB — COMPREHENSIVE METABOLIC PANEL
ALT: 61 U/L — ABNORMAL HIGH (ref 0–44)
AST: 44 U/L — ABNORMAL HIGH (ref 15–41)
Albumin: 3.7 g/dL (ref 3.5–5.0)
Alkaline Phosphatase: 74 U/L (ref 38–126)
Anion gap: 11 (ref 5–15)
BUN: 18 mg/dL (ref 6–20)
CO2: 25 mmol/L (ref 22–32)
Calcium: 9.8 mg/dL (ref 8.9–10.3)
Chloride: 101 mmol/L (ref 98–111)
Creatinine, Ser: 0.69 mg/dL (ref 0.44–1.00)
GFR calc Af Amer: >60 mL/min (ref >60)
GFR calc non Af Amer: >60 mL/min (ref >60)
Glucose, Bld: 99 mg/dL (ref 70–99)
Potassium: 4.4 mmol/L (ref 3.5–5.1)
Sodium: 137 mmol/L (ref 135–145)
Total Bilirubin: 0.6 mg/dL (ref 0.3–1.2)
Total Protein: 7 g/dL (ref 6.5–8.1)

## 2019-10-09 LAB — CBC
HCT: 38.9 % (ref 36.0–46.0)
Hemoglobin: 13 g/dL (ref 12.0–15.0)
MCH: 33 pg (ref 26.0–34.0)
MCHC: 33.4 g/dL (ref 30.0–36.0)
MCV: 98.7 fL (ref 80.0–100.0)
Platelets: 180 10*3/uL (ref 150–400)
RBC: 3.94 MIL/uL (ref 3.87–5.11)
RDW: 13.2 % (ref 11.5–15.5)
WBC: 6 10*3/uL (ref 4.0–10.5)
nRBC: 0 % (ref 0.0–0.2)

## 2019-10-09 LAB — PROTIME-INR
INR: 1.2 (ref 0.8–1.2)
Prothrombin Time: 14.9 seconds (ref 11.4–15.2)

## 2019-10-09 MED ORDER — WARFARIN SODIUM 6 MG PO TABS
6.0000 mg | ORAL_TABLET | ORAL | Status: AC
  Administered 2019-10-09: 6 mg via ORAL
  Filled 2019-10-09: qty 1

## 2019-10-09 MED ORDER — METOPROLOL SUCCINATE ER 25 MG PO TB24
25.0000 mg | ORAL_TABLET | Freq: Every day | ORAL | Status: DC
  Administered 2019-10-09: 10:00:00 25 mg via ORAL
  Filled 2019-10-09: qty 1

## 2019-10-09 NOTE — Progress Notes (Addendum)
ANTICOAGULATION CONSULT NOTE - Initial Consult  Pharmacy Consult for Warfarin (home med) Indication: stroke  No Known Allergies  Patient Measurements: Height: 5\' 2"  (157.5 cm) Weight: 149 lb 14.6 oz (68 kg) IBW/kg (Calculated) : 50.1  Vital Signs: Temp: 98 F (36.7 C) (02/25 2318) Temp Source: Oral (02/25 2318) BP: 159/89 (02/25 2318) Pulse Rate: 97 (02/25 2318)  Labs: Recent Labs    10/07/19 0434 10/08/19 0546 10/09/19 0546  HGB  --  12.4 13.0  HCT  --  37.8 38.9  PLT  --  157 180  LABPROT 18.4* 15.7* 14.9  INR 1.5* 1.3* 1.2  CREATININE  --  0.68 0.69   Estimated Creatinine Clearance: 71.9 mL/min (by C-G formula based on SCr of 0.69 mg/dL).  Medical History: Past Medical History:  Diagnosis Date  . Anxiety   . Breast cancer ([place]) 01/2014   Invasive lobular carcinoma, 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 ER/ PR+; Her 2 neu not overexpressed, microscopic positive margin (skin). Oncotype DX, low risk for recurrence.  . Depression   . Genetic screening 05/05/2014   negative /[facility]  . GERD (gastroesophageal reflux disease)   . Hypertension   . 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 atorvastatin (LIPITOR) 40 MG tablet Take 1 tablet (40 mg total) by mouth daily at 6 PM. 30 tablet 2 24+ hours at Unknown  . baclofen (LIORESAL) 10 MG tablet Take 10 mg by mouth 3 (three) times daily as needed for muscle spasms.    Past Week at PRN  . buPROPion (WELLBUTRIN XL) 150 MG 24 hr tablet Take 150 mg by mouth daily.   24+ hours at Unknown  . clonazePAM (KLONOPIN) 0.5 MG tablet Take 1 tablet (0.5 mg total) by mouth 2 (two) times daily as needed for anxiety. 4 tablet 0 Unknown at PRN  . cyanocobalamin 1000 MCG tablet Take 1 tablet (1,000 mcg total) by mouth daily. 30 tablet 2   . DULoxetine (CYMBALTA) 60 MG capsule Take 60 mg by mouth daily.   24+ hours at  Unknown  . gabapentin (NEURONTIN) 600 MG tablet Take 600 mg by mouth 4 (four) times daily.   24+ hours at Unknown  . metoprolol succinate (TOPROL-XL) 25 MG 24 hr tablet Take 25 mg by mouth daily.   24+ hours at Unknown  . omeprazole (PRILOSEC) 20 MG capsule Take 20 mg by mouth as needed (heart burn).    Unknown at PRN  . potassium chloride (K-DUR) 10 MEQ tablet Take 10 mEq by mouth daily.   24+ hours at Unknown  . warfarin (COUMADIN) 1 MG tablet Take 2 mg by mouth every evening.    24+ hours at Unknown  . anastrozole (ARIMIDEX) 1 MG tablet Take 1 tablet (1 mg total) by mouth every evening. (Patient not taking: Reported on 10/05/2019) 7 tablet 0 Not Taking at Unknown time    Assessment: Tina Sharp is a 54 y.o. female with hx of breast cancer, CVA (with some mild residual left hand weakness), alcohol use who presents for generalized weakness, concern for dehydration, and shakiness.  Pt on chronic Warfarin for h/o stroke, INR therapeutic on presentation.  Pt reportedly compliant on Warfarin Rx.  Home Regimen: Warfarin 2mg  daily   DATE INR DOSE  2/21 2.0  ---- 2/22 2.3 2mg  2/23 1.8 2.5mg  2/24 1.5 4mg  2/25 1.3 6mg  2/26 1.2  Goal of  Therapy:  INR 2-3 Monitor platelets by anticoagulation protocol: Yes   Plan:  2/26 INR subtherapeutic @ 1.2 and trending down. INR trending down most likely due to patient being admitted and not consuming alcohol.  Will order Warfarin 6 mg x 1 dose again today. Enoxaparin 40mg  daily ordered while INR subtherapeutic.    Will recheck INR in AM and continue to order warfarin based on lab results.    Pernell Dupre, PharmD, BCPS Clinical Pharmacist 10/09/2019 7:46 AM

## 2019-10-09 NOTE — Progress Notes (Signed)
PT Cancellation Note  Patient Details Name: Tina Sharp MRN: [id number] DOB: 1966-01-01   Cancelled Treatment:    Reason Eval/Treat Not Completed: Other (comment)(Pt in chair, stated they were getting her ready to leave, chaplain entered room with tissues for patient. PT exited to allow chaplain to initiate/finish session.)   Lieutenant Diego PT, DPT 11:09 AM,10/09/19

## 2019-10-09 NOTE — Progress Notes (Signed)
DISCHARGE NOTE:  Pt given discharge instructions. Pt verbalized understanding. Pt wheeled to car by staff. Daughter providing transportation.

## 2019-10-09 NOTE — Discharge Summary (Signed)
Physician Discharge Summary  Tina Sharp [id number] DOB: 1965-12-18 DOA: 10/04/2019  PCP: Kirk Ruths, MD  Admit date: 10/04/2019 Discharge date: 10/09/2019  Admitted From: home Disposition:  Home w/ home health  Recommendations for Outpatient Follow-up:  1. Follow up with PCP in 1 week  Home Health: yes Equipment/Devices:  Discharge Condition: stable CODE STATUS: full  Diet recommendation: Heart Healthy  Brief/Interim Summary: HPI was taken from Dr. Damita Dunnings: Tina Sharp is a 54 y.o. 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  Patient admits to drinking alcohol heavily every day.  Most of the history is taken from the ER got Ativan in the ER and says she feels like she cannot think clearly.  She is able to deny headache or blurred vision.  Denies new numbness weakness or tingling on one side of the face or extremities.  Denies cough fever chills, chest pain, GI or GU symptoms.  ED Course: On arrival in the emergency room she was hypertensive with BP 161/104, tacky at 102, RR 21, O2 sat 93% on room air and afebrile.  Work-up was significant for hyponatremia of 131, elevated AST/ALT of 119/81, elevated bilirubin 1.3.  EtOH was 36.  Bicarb was normal at 22 but anion gap elevated at 19.  INR 2.0.  Urinalysis showed positive ketones blood work was otherwise unremarkable.  Head CT showed no acute but did show old right MCA territory infarct.  EKG sinus tachycardia.  Patient was treated in the emergency room with Ativan suspected acute alcohol withdrawal.  Hospitalist consulted for admission.  Hospital Course from Dr. Lenise Herald 2/24-2/26/21: Pt was admitted for  alcohol w/drawal syndrome and was placed on CIWA protocol. Pt's mother recently passed away in August 30, 2019 and pt was drinking to try to numb the pain. Pt did receive alcohol cessation counseling while inpatient. PT/OT initially saw the pt and recommended SNF but then repeat evaluation recommended home health. Pt was d/c home w/ home health. Of note, it was recommended for pt to see a therapist as an outpatient as well to help with coping mechanisms, depression & grieving. Pt verbalized her understanding   Discharge Diagnoses:  Principal Problem:   Alcohol withdrawal syndrome, uncomplicated (HCC) Active Problems:   Hyperhomocysteinemia (HCC)   Depression   Neuropathy   History of cryptogenic cerebrovascular accident (CVA) with residual deficit   Hyponatremia   Alcoholic liver disease ([place])   Chronic anticoagulation   History of breast cancer   Alcohol withdrawal delirium, acute, mixed level of activity (HCC)  Alcohol withdrawal syndrome: continue on CIWA withdrawal protocol. Seizure fall and aspiration precautions. Alcohol cessation counseling. Pt's mother passed away in 2019/08/30 and pt was drinking b/c of this.  Continue on thiamine, folate, & MVI  Hyponatremia: Resolved  Transaminitis: likely secondary to alcohol abuse. Will continue to monitor   Hx of cryptogenic CVA: with residual left hemiparesis. Hx of hyperhomocystinemia & on coumadin.  CT shows no acute findings but evidence of old MCA territory infarct. Continue to monitor INR daily. INR is subtherapeutic    History of breast cancer: s/p left mastectomy. Pt is not taking anastrozole   Depression: severity unknown.Continue bupropion, clonazepam, duloxetine. Recommend pt see a therapist as an outpatient  Discharge Instructions  Discharge Instructions    Diet - low sodium heart healthy   Complete by: As directed    Discharge instructions   Complete by: As directed    W/ PCP in 1 week   Increase activity slowly   Complete  by: As directed      Allergies as of 10/09/2019   No Known Allergies     Medication List    TAKE these medications   anastrozole 1 MG tablet Commonly known as: ARIMIDEX Take 1 tablet (1 mg total) by mouth every evening.   atorvastatin 40 MG tablet Commonly known as: LIPITOR Take 1 tablet (40 mg total) by mouth daily at 6 PM.   baclofen 10 MG tablet Commonly known as: LIORESAL Take 10 mg by mouth 3 (three) times daily as needed for muscle spasms.   buPROPion 150 MG 24 hr tablet Commonly known as: WELLBUTRIN XL Take 150 mg by mouth daily.   clonazePAM 0.5 MG tablet Commonly known as: KlonoPIN Take 1 tablet (0.5 mg total) by mouth 2 (two) times daily as needed for anxiety.   cyanocobalamin 1000 MCG tablet Take 1 tablet (1,000 mcg total) by mouth daily.   DULoxetine 60 MG capsule Commonly known as: CYMBALTA Take 60 mg by mouth daily.   gabapentin 600 MG tablet Commonly known as: NEURONTIN Take 600 mg by mouth 4 (four) times daily.   metoprolol succinate 25 MG 24 hr tablet Commonly known as: TOPROL-XL Take 25 mg by mouth daily.   omeprazole 20 MG capsule Commonly known as: PRILOSEC Take 20 mg by mouth as needed (heart burn).   potassium chloride 10 MEQ tablet Commonly known as: KLOR-CON Take 10 mEq by mouth daily.   warfarin 1 MG tablet Commonly known as: COUMADIN Take 2 mg by mouth every evening.       Contact information for follow-up providers    Schedule an appointment as soon as possible for a visit  with [facility].   Why: for assistance with alcohol use Contact information: [place] [address] [phone]            Contact information for after-discharge care    Destination    [facility] Preferred SNF .   Service: Skilled Nursing Contact information: [address] [place] [phone]                 No Known  Allergies  Consultations:  n/a   Procedures/Studies: CT Head Wo Contrast  Result Date: 10/04/2019 CLINICAL DATA:  Generalized weakness. Left facial numbness. Symptoms worsening throughout the day. EXAM: CT HEAD WITHOUT CONTRAST TECHNIQUE: Contiguous axial images were obtained from the base of the skull through the vertex without intravenous contrast. COMPARISON:  10/27/2018 FINDINGS: Brain: No abnormality seen affecting the brainstem or cerebellum. Left cerebral hemisphere shows mild generalized volume loss without focal insult. There are old infarctions in the right parietal and frontoparietal junction regions. No evidence of recent infarction, mass lesion, hemorrhage, hydrocephalus or extra-axial collection Vascular: There is atherosclerotic calcification of the major vessels at the base of the brain. Skull: Normal Sinuses/Orbits: Clear except for a single opacified ethmoid air cell on the right. Orbits negative. Other: None IMPRESSION: No acute CT finding. Old infarctions in the right middle cerebral artery territory affecting the right frontoparietal vertex and right parietal brain. Electronically Signed   By: Nelson Chimes M.D.   On: 10/04/2019 23:30   DG Knee Complete 4 Views Right  Result  Date: 10/04/2019 CLINICAL DATA:  Weakness and pain, fell 3 days ago EXAM: RIGHT KNEE - COMPLETE 4+ VIEW COMPARISON:  None. FINDINGS: Frontal, bilateral oblique, lateral views of the right knee demonstrate no fractures. Alignment is anatomic. There is mild medial compartmental joint space narrowing. No joint effusion. IMPRESSION: 1. No acute bony abnormality. Electronically Signed   By: Randa Ngo M.D.   On: 10/04/2019 19:58     Subjective: Pt c/o fatigue  Discharge Exam: Vitals:   10/08/19 2318 10/09/19 0802  BP: (!) 159/89 131/82  Pulse: 97 99  Resp: 16 17  Temp: 98 F (36.7 C) 98.7 F (37.1 C)  SpO2: 94% 95%   Vitals:   10/08/19 0808 10/08/19 1525 10/08/19 2318 10/09/19 0802  BP: (!)  149/85 120/83 (!) 159/89 131/82  Pulse: (!) 102 (!) 101 97 99  Resp:   16 17  Temp: 98 F (36.7 C) 97.7 F (36.5 C) 98 F (36.7 C) 98.7 F (37.1 C)  TempSrc: Oral Oral Oral Oral  SpO2: 97% 95% 94% 95%  Weight:      Height:        General: Pt is alert, awake, not in acute distress Cardiovascular:  S1/S2 +, no rubs, no gallops Respiratory: CTA bilaterally, no wheezing, no rales Abdominal: Soft, NT, ND, bowel sounds + Extremities: no edema, no cyanosis    The results of significant diagnostics from this hospitalization (including imaging, microbiology, ancillary and laboratory) are listed below for reference.     Microbiology: Recent Results (from the past 240 hour(s))  Respiratory Panel by RT PCR (Flu A&B, Covid) - Nasopharyngeal Swab     Status: None   Collection Time: 10/05/19  2:25 AM   Specimen: Nasopharyngeal Swab  Result Value Ref Range Status   SARS Coronavirus 2 by RT PCR NEGATIVE NEGATIVE Final    Comment: (NOTE) SARS-CoV-2 target nucleic acids are NOT DETECTED. The SARS-CoV-2 RNA is generally detectable in upper respiratoy specimens during the acute phase of infection. The lowest concentration of SARS-CoV-2 viral copies this assay can detect is 131 copies/mL. A negative result does not preclude SARS-Cov-2 infection and should not be used as the sole basis for treatment or other patient management decisions. A negative result may occur with  improper specimen collection/handling, submission of specimen other than nasopharyngeal swab, presence of viral mutation(s) within the areas targeted by this assay, and inadequate number of viral copies (<131 copies/mL). A negative result must be combined with clinical observations, patient history, and epidemiological information. The expected result is Negative. Fact Sheet for Patients:  PinkCheek.be Fact Sheet for Healthcare Providers:  GravelBags.it This test is  not yet ap proved or cleared by the [place] FDA and  has been authorized for detection and/or diagnosis of SARS-CoV-2 by FDA under an Emergency Use Authorization (EUA). This EUA will remain  in effect (meaning this test can be used) for the duration of the COVID-19 declaration under Section 564(b)(1) of the Act, 21 U.S.C. section 360bbb-3(b)(1), unless the authorization is terminated or revoked sooner.    Influenza A by PCR NEGATIVE NEGATIVE Final   Influenza B by PCR NEGATIVE NEGATIVE Final    Comment: (NOTE) The Xpert Xpress SARS-CoV-2/FLU/RSV assay is intended as an aid in  the diagnosis of influenza from Nasopharyngeal swab specimens and  should not be used as a sole basis for treatment. Nasal washings and  aspirates are unacceptable for Xpert Xpress SARS-CoV-2/FLU/RSV  testing. Fact Sheet for Patients: PinkCheek.be Fact Sheet for Healthcare Providers: GravelBags.it This  test is not yet approved or cleared by the [place] and  has been authorized for detection and/or diagnosis of SARS-CoV-2 by  FDA under an Emergency Use Authorization (EUA). This EUA will remain  in effect (meaning this test can be used) for the duration of the  Covid-19 declaration under Section 564(b)(1) of the Act, 21  U.S.C. section 360bbb-3(b)(1), unless the authorization is  terminated or revoked. Performed at [facility], [address] [place], [place] [address]      Labs: BNP (last 3 results) No results for input(s): BNP in the last 8760 hours. Basic Metabolic Panel: Recent Labs  Lab 10/04/19 1929 10/05/19 0406 10/06/19 0524 10/08/19 0546 10/09/19 0546  NA 131* 135 136 137 137  K 4.1 3.8 3.5 4.2 4.4  CL 90* 97* 99 101 101  CO2 22 26 29 29 25   GLUCOSE 88 86 114* 92 99  BUN 7 7 9 11 18   CREATININE 0.64 0.69 0.64 0.68 0.69  CALCIUM 8.6* 7.7* 8.3* 8.8* 9.8  MG  --  1.9  --   --   --   PHOS  --  2.4*  --    --   --    Liver Function Tests: Recent Labs  Lab 10/04/19 1929 10/05/19 0406 10/08/19 0546 10/09/19 0546  AST 119* 103* 60* 44*  ALT 81* 65* 66* 61*  ALKPHOS 95 78 71 74  BILITOT 1.3* 1.6* 0.7 0.6  PROT 7.9 6.6 6.4* 7.0  ALBUMIN 4.6 3.6 3.3* 3.7   No results for input(s): LIPASE, AMYLASE in the last 168 hours. No results for input(s): AMMONIA in the last 168 hours. CBC: Recent Labs  Lab 10/04/19 1929 10/05/19 0406 10/08/19 0546 10/09/19 0546  WBC 7.0 5.8 4.8 6.0  HGB 15.3* 13.1 12.4 13.0  HCT 44.8 37.6 37.8 38.9  MCV 95.7 94.9 99.0 98.7  PLT 269 204 157 180   Cardiac Enzymes: No results for input(s): CKTOTAL, CKMB, CKMBINDEX, TROPONINI in the last 168 hours. BNP: Invalid input(s): POCBNP CBG: No results for input(s): GLUCAP in the last 168 hours. D-Dimer No results for input(s): DDIMER in the last 72 hours. Hgb A1c No results for input(s): HGBA1C in the last 72 hours. Lipid Profile No results for input(s): CHOL, HDL, LDLCALC, TRIG, CHOLHDL, LDLDIRECT in the last 72 hours. Thyroid function studies No results for input(s): TSH, T4TOTAL, T3FREE, THYROIDAB in the last 72 hours.  Invalid input(s): FREET3 Anemia work up No results for input(s): VITAMINB12, FOLATE, FERRITIN, TIBC, IRON, RETICCTPCT in the last 72 hours. Urinalysis    Component Value Date/Time   COLORURINE YELLOW (A) 10/05/2019 0027   APPEARANCEUR HAZY (A) 10/05/2019 0027   APPEARANCEUR CLEAR 04/08/2014 0848   LABSPEC 1.010 10/05/2019 0027   LABSPEC 1.020 04/08/2014 0848   PHURINE 6.0 10/05/2019 0027   GLUCOSEU NEGATIVE 10/05/2019 0027   GLUCOSEU NEGATIVE 04/08/2014 0848   HGBUR SMALL (A) 10/05/2019 0027   BILIRUBINUR NEGATIVE 10/05/2019 0027   BILIRUBINUR 2+ 04/08/2014 0848   KETONESUR 20 (A) 10/05/2019 0027   PROTEINUR 100 (A) 10/05/2019 0027   UROBILINOGEN 1.0 06/21/2015 1115   NITRITE NEGATIVE 10/05/2019 0027   LEUKOCYTESUR NEGATIVE 10/05/2019 0027   LEUKOCYTESUR NEGATIVE 04/08/2014  0848   Sepsis Labs Invalid input(s): PROCALCITONIN,  WBC,  LACTICIDVEN Microbiology Recent Results (from the past 240 hour(s))  Respiratory Panel by RT PCR (Flu A&B, Covid) - Nasopharyngeal Swab     Status: None   Collection Time: 10/05/19  2:25 AM   Specimen:  Nasopharyngeal Swab  Result Value Ref Range Status   SARS Coronavirus 2 by RT PCR NEGATIVE NEGATIVE Final    Comment: (NOTE) SARS-CoV-2 target nucleic acids are NOT DETECTED. The SARS-CoV-2 RNA is generally detectable in upper respiratoy specimens during the acute phase of infection. The lowest concentration of SARS-CoV-2 viral copies this assay can detect is 131 copies/mL. A negative result does not preclude SARS-Cov-2 infection and should not be used as the sole basis for treatment or other patient management decisions. A negative result may occur with  improper specimen collection/handling, submission of specimen other than nasopharyngeal swab, presence of viral mutation(s) within the areas targeted by this assay, and inadequate number of viral copies (<131 copies/mL). A negative result must be combined with clinical observations, patient history, and epidemiological information. The expected result is Negative. Fact Sheet for Patients:  PinkCheek.be Fact Sheet for Healthcare Providers:  GravelBags.it This test is not yet ap proved or cleared by the [place] FDA and  has been authorized for detection and/or diagnosis of SARS-CoV-2 by FDA under an Emergency Use Authorization (EUA). This EUA will remain  in effect (meaning this test can be used) for the duration of the COVID-19 declaration under Section 564(b)(1) of the Act, 21 U.S.C. section 360bbb-3(b)(1), unless the authorization is terminated or revoked sooner.    Influenza A by PCR NEGATIVE NEGATIVE Final   Influenza B by PCR NEGATIVE NEGATIVE Final    Comment: (NOTE) The Xpert Xpress SARS-CoV-2/FLU/RSV  assay is intended as an aid in  the diagnosis of influenza from Nasopharyngeal swab specimens and  should not be used as a sole basis for treatment. Nasal washings and  aspirates are unacceptable for Xpert Xpress SARS-CoV-2/FLU/RSV  testing. Fact Sheet for Patients: PinkCheek.be Fact Sheet for Healthcare Providers: GravelBags.it This test is not yet approved or cleared by the [place] FDA and  has been authorized for detection and/or diagnosis of SARS-CoV-2 by  FDA under an Emergency Use Authorization (EUA). This EUA will remain  in effect (meaning this test can be used) for the duration of the  Covid-19 declaration under Section 564(b)(1) of the Act, 21  U.S.C. section 360bbb-3(b)(1), unless the authorization is  terminated or revoked. Performed at [facility], [address], [place], [place] [address]      Time coordinating discharge: Over 30 minutes  SIGNED:   Wyvonnia Dusky, MD  [facility] 10/09/2019, 2:17 PM Pager   If 7PM-7AM, please contact night-coverage [email or web address]

## 2019-10-09 NOTE — Progress Notes (Addendum)
   10/09/19 1100  Clinical Encounter Type  Visited With Patient  Visit Type Follow-up  Referral From Chaplain  Consult/Referral To Chaplain  Chaplain visited with patient per request of Chaplain Mariea Clonts. Patient had spilled coffee and was waiting for someone to clean her bed and change her gown. Patient thought Chaplain was the Chaplain that was with her mother before she died. When patient spoke of her mother she began to cry. She continually apologized for crying and Chaplain explained there was no need for her to apologize. Chaplain told patient to feel free to cry in front of her. Patient is looking forward to being discharged later today. Patient explained that she has had several stokes and when she became sick on Sick, she wasn't sure what was going and called her daughter, Tina Sharp, telling her she needed to go to the hospital.  Patient said that she has two daughters, who are both in college and she has two grandchildren. Patient showed Chaplain a picture of her grandchildren. Patient acknowledges feeling better and is looking forward to going home. Chaplain offered pastoral presence, empathy, and prayer.

## 2019-10-10 DIAGNOSIS — F419 Anxiety disorder, unspecified: Secondary | ICD-10-CM | POA: Diagnosis not present

## 2019-10-10 DIAGNOSIS — I69354 Hemiplegia and hemiparesis following cerebral infarction affecting left non-dominant side: Secondary | ICD-10-CM | POA: Diagnosis not present

## 2019-10-10 DIAGNOSIS — S81811D Laceration without foreign body, right lower leg, subsequent encounter: Secondary | ICD-10-CM | POA: Diagnosis not present

## 2019-10-10 DIAGNOSIS — F329 Major depressive disorder, single episode, unspecified: Secondary | ICD-10-CM | POA: Diagnosis not present

## 2019-10-10 DIAGNOSIS — E7211 Homocystinuria: Secondary | ICD-10-CM | POA: Diagnosis not present

## 2019-10-10 DIAGNOSIS — I10 Essential (primary) hypertension: Secondary | ICD-10-CM | POA: Diagnosis not present

## 2019-10-10 DIAGNOSIS — K219 Gastro-esophageal reflux disease without esophagitis: Secondary | ICD-10-CM | POA: Diagnosis not present

## 2019-10-10 DIAGNOSIS — F10230 Alcohol dependence with withdrawal, uncomplicated: Secondary | ICD-10-CM | POA: Diagnosis not present

## 2019-10-10 DIAGNOSIS — G629 Polyneuropathy, unspecified: Secondary | ICD-10-CM | POA: Diagnosis not present

## 2019-10-10 NOTE — Progress Notes (Signed)
[place] introduced to pt. by EVS staff member; [place] intended to visit only briefly since [employer] had just left rm. but pt. began sharing about the loss of her mother in December, and [facility] judged additional supportive presence would be helpful; pt. shared that she lived w/her mother for the last two years of her life; she did not feel able to grieve in the same way as other family members when her mother died; she expressed deep guilt due to her last interaction w/her mother being one of anger, as she called for her mother to be taken to the hospital when her mother was reluctant to go.  [facility] provided active and supportive listening and prayed for pt. after pt. shared what she was burdened by.  Pt. grateful for chaplain visits the last few days.  Pt. scheduled to be discharged today; Man is available if needed.        10/09/19 1130  Clinical Encounter Type  Visited With Patient  Visit Type Follow-up;Psychological support;Spiritual support  Referral From Other (Comment);Chaplain Financial trader)  Spiritual Encounters  Spiritual Needs Emotional;Prayer  Stress Factors  Patient Stress Factors Loss;Major life changes;Health changes;Lack of caregivers

## 2019-10-13 ENCOUNTER — Ambulatory Visit: Payer: Medicare HMO | Admitting: Physical Therapy

## 2019-10-13 DIAGNOSIS — F329 Major depressive disorder, single episode, unspecified: Secondary | ICD-10-CM | POA: Diagnosis not present

## 2019-10-13 DIAGNOSIS — F10230 Alcohol dependence with withdrawal, uncomplicated: Secondary | ICD-10-CM | POA: Diagnosis not present

## 2019-10-13 DIAGNOSIS — S81811D Laceration without foreign body, right lower leg, subsequent encounter: Secondary | ICD-10-CM | POA: Diagnosis not present

## 2019-10-13 DIAGNOSIS — G629 Polyneuropathy, unspecified: Secondary | ICD-10-CM | POA: Diagnosis not present

## 2019-10-13 DIAGNOSIS — I10 Essential (primary) hypertension: Secondary | ICD-10-CM | POA: Diagnosis not present

## 2019-10-13 DIAGNOSIS — K219 Gastro-esophageal reflux disease without esophagitis: Secondary | ICD-10-CM | POA: Diagnosis not present

## 2019-10-13 DIAGNOSIS — F419 Anxiety disorder, unspecified: Secondary | ICD-10-CM | POA: Diagnosis not present

## 2019-10-13 DIAGNOSIS — I69354 Hemiplegia and hemiparesis following cerebral infarction affecting left non-dominant side: Secondary | ICD-10-CM | POA: Diagnosis not present

## 2019-10-13 DIAGNOSIS — E7211 Homocystinuria: Secondary | ICD-10-CM | POA: Diagnosis not present

## 2019-10-15 DIAGNOSIS — G629 Polyneuropathy, unspecified: Secondary | ICD-10-CM | POA: Diagnosis not present

## 2019-10-15 DIAGNOSIS — F10230 Alcohol dependence with withdrawal, uncomplicated: Secondary | ICD-10-CM | POA: Diagnosis not present

## 2019-10-15 DIAGNOSIS — K219 Gastro-esophageal reflux disease without esophagitis: Secondary | ICD-10-CM | POA: Diagnosis not present

## 2019-10-15 DIAGNOSIS — E7211 Homocystinuria: Secondary | ICD-10-CM | POA: Diagnosis not present

## 2019-10-15 DIAGNOSIS — I69354 Hemiplegia and hemiparesis following cerebral infarction affecting left non-dominant side: Secondary | ICD-10-CM | POA: Diagnosis not present

## 2019-10-15 DIAGNOSIS — I10 Essential (primary) hypertension: Secondary | ICD-10-CM | POA: Diagnosis not present

## 2019-10-15 DIAGNOSIS — F419 Anxiety disorder, unspecified: Secondary | ICD-10-CM | POA: Diagnosis not present

## 2019-10-15 DIAGNOSIS — F329 Major depressive disorder, single episode, unspecified: Secondary | ICD-10-CM | POA: Diagnosis not present

## 2019-10-15 DIAGNOSIS — S81811D Laceration without foreign body, right lower leg, subsequent encounter: Secondary | ICD-10-CM | POA: Diagnosis not present

## 2019-10-16 DIAGNOSIS — K219 Gastro-esophageal reflux disease without esophagitis: Secondary | ICD-10-CM | POA: Diagnosis not present

## 2019-10-16 DIAGNOSIS — F329 Major depressive disorder, single episode, unspecified: Secondary | ICD-10-CM | POA: Diagnosis not present

## 2019-10-16 DIAGNOSIS — I10 Essential (primary) hypertension: Secondary | ICD-10-CM | POA: Diagnosis not present

## 2019-10-16 DIAGNOSIS — S81811D Laceration without foreign body, right lower leg, subsequent encounter: Secondary | ICD-10-CM | POA: Diagnosis not present

## 2019-10-16 DIAGNOSIS — F419 Anxiety disorder, unspecified: Secondary | ICD-10-CM | POA: Diagnosis not present

## 2019-10-16 DIAGNOSIS — E7211 Homocystinuria: Secondary | ICD-10-CM | POA: Diagnosis not present

## 2019-10-16 DIAGNOSIS — I69354 Hemiplegia and hemiparesis following cerebral infarction affecting left non-dominant side: Secondary | ICD-10-CM | POA: Diagnosis not present

## 2019-10-16 DIAGNOSIS — G629 Polyneuropathy, unspecified: Secondary | ICD-10-CM | POA: Diagnosis not present

## 2019-10-16 DIAGNOSIS — F10230 Alcohol dependence with withdrawal, uncomplicated: Secondary | ICD-10-CM | POA: Diagnosis not present

## 2019-10-19 DIAGNOSIS — F10230 Alcohol dependence with withdrawal, uncomplicated: Secondary | ICD-10-CM | POA: Diagnosis not present

## 2019-10-19 DIAGNOSIS — E7211 Homocystinuria: Secondary | ICD-10-CM | POA: Diagnosis not present

## 2019-10-19 DIAGNOSIS — F419 Anxiety disorder, unspecified: Secondary | ICD-10-CM | POA: Diagnosis not present

## 2019-10-19 DIAGNOSIS — S81811D Laceration without foreign body, right lower leg, subsequent encounter: Secondary | ICD-10-CM | POA: Diagnosis not present

## 2019-10-19 DIAGNOSIS — G629 Polyneuropathy, unspecified: Secondary | ICD-10-CM | POA: Diagnosis not present

## 2019-10-19 DIAGNOSIS — I10 Essential (primary) hypertension: Secondary | ICD-10-CM | POA: Diagnosis not present

## 2019-10-19 DIAGNOSIS — F329 Major depressive disorder, single episode, unspecified: Secondary | ICD-10-CM | POA: Diagnosis not present

## 2019-10-19 DIAGNOSIS — K219 Gastro-esophageal reflux disease without esophagitis: Secondary | ICD-10-CM | POA: Diagnosis not present

## 2019-10-19 DIAGNOSIS — I69354 Hemiplegia and hemiparesis following cerebral infarction affecting left non-dominant side: Secondary | ICD-10-CM | POA: Diagnosis not present

## 2019-10-20 ENCOUNTER — Encounter: Payer: Medicare HMO | Admitting: Physical Therapy

## 2019-10-21 DIAGNOSIS — G629 Polyneuropathy, unspecified: Secondary | ICD-10-CM | POA: Diagnosis not present

## 2019-10-21 DIAGNOSIS — I10 Essential (primary) hypertension: Secondary | ICD-10-CM | POA: Diagnosis not present

## 2019-10-21 DIAGNOSIS — F329 Major depressive disorder, single episode, unspecified: Secondary | ICD-10-CM | POA: Diagnosis not present

## 2019-10-21 DIAGNOSIS — F10230 Alcohol dependence with withdrawal, uncomplicated: Secondary | ICD-10-CM | POA: Diagnosis not present

## 2019-10-21 DIAGNOSIS — I69354 Hemiplegia and hemiparesis following cerebral infarction affecting left non-dominant side: Secondary | ICD-10-CM | POA: Diagnosis not present

## 2019-10-21 DIAGNOSIS — K219 Gastro-esophageal reflux disease without esophagitis: Secondary | ICD-10-CM | POA: Diagnosis not present

## 2019-10-21 DIAGNOSIS — F419 Anxiety disorder, unspecified: Secondary | ICD-10-CM | POA: Diagnosis not present

## 2019-10-21 DIAGNOSIS — S81811D Laceration without foreign body, right lower leg, subsequent encounter: Secondary | ICD-10-CM | POA: Diagnosis not present

## 2019-10-21 DIAGNOSIS — E7211 Homocystinuria: Secondary | ICD-10-CM | POA: Diagnosis not present

## 2019-10-26 DIAGNOSIS — G629 Polyneuropathy, unspecified: Secondary | ICD-10-CM | POA: Diagnosis not present

## 2019-10-26 DIAGNOSIS — F10230 Alcohol dependence with withdrawal, uncomplicated: Secondary | ICD-10-CM | POA: Diagnosis not present

## 2019-10-26 DIAGNOSIS — K219 Gastro-esophageal reflux disease without esophagitis: Secondary | ICD-10-CM | POA: Diagnosis not present

## 2019-10-26 DIAGNOSIS — I69354 Hemiplegia and hemiparesis following cerebral infarction affecting left non-dominant side: Secondary | ICD-10-CM | POA: Diagnosis not present

## 2019-10-26 DIAGNOSIS — E7211 Homocystinuria: Secondary | ICD-10-CM | POA: Diagnosis not present

## 2019-10-26 DIAGNOSIS — I10 Essential (primary) hypertension: Secondary | ICD-10-CM | POA: Diagnosis not present

## 2019-10-26 DIAGNOSIS — F419 Anxiety disorder, unspecified: Secondary | ICD-10-CM | POA: Diagnosis not present

## 2019-10-26 DIAGNOSIS — S81811D Laceration without foreign body, right lower leg, subsequent encounter: Secondary | ICD-10-CM | POA: Diagnosis not present

## 2019-10-26 DIAGNOSIS — F329 Major depressive disorder, single episode, unspecified: Secondary | ICD-10-CM | POA: Diagnosis not present

## 2019-10-27 ENCOUNTER — Encounter: Payer: Medicare HMO | Admitting: Physical Therapy

## 2019-10-28 DIAGNOSIS — E7211 Homocystinuria: Secondary | ICD-10-CM | POA: Diagnosis not present

## 2019-10-28 DIAGNOSIS — I10 Essential (primary) hypertension: Secondary | ICD-10-CM | POA: Diagnosis not present

## 2019-10-28 DIAGNOSIS — F10230 Alcohol dependence with withdrawal, uncomplicated: Secondary | ICD-10-CM | POA: Diagnosis not present

## 2019-10-28 DIAGNOSIS — F419 Anxiety disorder, unspecified: Secondary | ICD-10-CM | POA: Diagnosis not present

## 2019-10-28 DIAGNOSIS — K219 Gastro-esophageal reflux disease without esophagitis: Secondary | ICD-10-CM | POA: Diagnosis not present

## 2019-10-28 DIAGNOSIS — S81811D Laceration without foreign body, right lower leg, subsequent encounter: Secondary | ICD-10-CM | POA: Diagnosis not present

## 2019-10-28 DIAGNOSIS — G629 Polyneuropathy, unspecified: Secondary | ICD-10-CM | POA: Diagnosis not present

## 2019-10-28 DIAGNOSIS — I69354 Hemiplegia and hemiparesis following cerebral infarction affecting left non-dominant side: Secondary | ICD-10-CM | POA: Diagnosis not present

## 2019-10-28 DIAGNOSIS — F329 Major depressive disorder, single episode, unspecified: Secondary | ICD-10-CM | POA: Diagnosis not present

## 2019-11-02 DIAGNOSIS — I10 Essential (primary) hypertension: Secondary | ICD-10-CM | POA: Diagnosis not present

## 2019-11-02 DIAGNOSIS — E7211 Homocystinuria: Secondary | ICD-10-CM | POA: Diagnosis not present

## 2019-11-02 DIAGNOSIS — I69354 Hemiplegia and hemiparesis following cerebral infarction affecting left non-dominant side: Secondary | ICD-10-CM | POA: Diagnosis not present

## 2019-11-02 DIAGNOSIS — S81811D Laceration without foreign body, right lower leg, subsequent encounter: Secondary | ICD-10-CM | POA: Diagnosis not present

## 2019-11-02 DIAGNOSIS — F329 Major depressive disorder, single episode, unspecified: Secondary | ICD-10-CM | POA: Diagnosis not present

## 2019-11-02 DIAGNOSIS — F419 Anxiety disorder, unspecified: Secondary | ICD-10-CM | POA: Diagnosis not present

## 2019-11-02 DIAGNOSIS — K219 Gastro-esophageal reflux disease without esophagitis: Secondary | ICD-10-CM | POA: Diagnosis not present

## 2019-11-02 DIAGNOSIS — F10230 Alcohol dependence with withdrawal, uncomplicated: Secondary | ICD-10-CM | POA: Diagnosis not present

## 2019-11-02 DIAGNOSIS — G629 Polyneuropathy, unspecified: Secondary | ICD-10-CM | POA: Diagnosis not present

## 2019-11-03 ENCOUNTER — Encounter: Payer: Medicare HMO | Admitting: Physical Therapy

## 2019-11-04 DIAGNOSIS — F419 Anxiety disorder, unspecified: Secondary | ICD-10-CM | POA: Diagnosis not present

## 2019-11-04 DIAGNOSIS — I10 Essential (primary) hypertension: Secondary | ICD-10-CM | POA: Diagnosis not present

## 2019-11-04 DIAGNOSIS — E7211 Homocystinuria: Secondary | ICD-10-CM | POA: Diagnosis not present

## 2019-11-04 DIAGNOSIS — F10230 Alcohol dependence with withdrawal, uncomplicated: Secondary | ICD-10-CM | POA: Diagnosis not present

## 2019-11-04 DIAGNOSIS — I69354 Hemiplegia and hemiparesis following cerebral infarction affecting left non-dominant side: Secondary | ICD-10-CM | POA: Diagnosis not present

## 2019-11-04 DIAGNOSIS — F329 Major depressive disorder, single episode, unspecified: Secondary | ICD-10-CM | POA: Diagnosis not present

## 2019-11-04 DIAGNOSIS — S81811D Laceration without foreign body, right lower leg, subsequent encounter: Secondary | ICD-10-CM | POA: Diagnosis not present

## 2019-11-04 DIAGNOSIS — K219 Gastro-esophageal reflux disease without esophagitis: Secondary | ICD-10-CM | POA: Diagnosis not present

## 2019-11-04 DIAGNOSIS — G629 Polyneuropathy, unspecified: Secondary | ICD-10-CM | POA: Diagnosis not present

## 2019-11-09 DIAGNOSIS — K219 Gastro-esophageal reflux disease without esophagitis: Secondary | ICD-10-CM | POA: Diagnosis not present

## 2019-11-09 DIAGNOSIS — Z87891 Personal history of nicotine dependence: Secondary | ICD-10-CM | POA: Diagnosis not present

## 2019-11-09 DIAGNOSIS — B029 Zoster without complications: Secondary | ICD-10-CM | POA: Diagnosis not present

## 2019-11-09 DIAGNOSIS — F10230 Alcohol dependence with withdrawal, uncomplicated: Secondary | ICD-10-CM | POA: Diagnosis not present

## 2019-11-09 DIAGNOSIS — G629 Polyneuropathy, unspecified: Secondary | ICD-10-CM | POA: Diagnosis not present

## 2019-11-09 DIAGNOSIS — I10 Essential (primary) hypertension: Secondary | ICD-10-CM | POA: Diagnosis not present

## 2019-11-09 DIAGNOSIS — F329 Major depressive disorder, single episode, unspecified: Secondary | ICD-10-CM | POA: Diagnosis not present

## 2019-11-09 DIAGNOSIS — F419 Anxiety disorder, unspecified: Secondary | ICD-10-CM | POA: Diagnosis not present

## 2019-11-09 DIAGNOSIS — E7211 Homocystinuria: Secondary | ICD-10-CM | POA: Diagnosis not present

## 2019-11-09 DIAGNOSIS — I69354 Hemiplegia and hemiparesis following cerebral infarction affecting left non-dominant side: Secondary | ICD-10-CM | POA: Diagnosis not present

## 2019-11-09 DIAGNOSIS — S81811D Laceration without foreign body, right lower leg, subsequent encounter: Secondary | ICD-10-CM | POA: Diagnosis not present

## 2019-11-10 ENCOUNTER — Encounter: Payer: Medicare HMO | Admitting: Physical Therapy

## 2019-11-11 DIAGNOSIS — K219 Gastro-esophageal reflux disease without esophagitis: Secondary | ICD-10-CM | POA: Diagnosis not present

## 2019-11-11 DIAGNOSIS — I10 Essential (primary) hypertension: Secondary | ICD-10-CM | POA: Diagnosis not present

## 2019-11-11 DIAGNOSIS — I69354 Hemiplegia and hemiparesis following cerebral infarction affecting left non-dominant side: Secondary | ICD-10-CM | POA: Diagnosis not present

## 2019-11-11 DIAGNOSIS — G629 Polyneuropathy, unspecified: Secondary | ICD-10-CM | POA: Diagnosis not present

## 2019-11-11 DIAGNOSIS — F419 Anxiety disorder, unspecified: Secondary | ICD-10-CM | POA: Diagnosis not present

## 2019-11-11 DIAGNOSIS — F329 Major depressive disorder, single episode, unspecified: Secondary | ICD-10-CM | POA: Diagnosis not present

## 2019-11-11 DIAGNOSIS — F10230 Alcohol dependence with withdrawal, uncomplicated: Secondary | ICD-10-CM | POA: Diagnosis not present

## 2019-11-11 DIAGNOSIS — S81811D Laceration without foreign body, right lower leg, subsequent encounter: Secondary | ICD-10-CM | POA: Diagnosis not present

## 2019-11-11 DIAGNOSIS — E7211 Homocystinuria: Secondary | ICD-10-CM | POA: Diagnosis not present

## 2019-11-16 DIAGNOSIS — S81811D Laceration without foreign body, right lower leg, subsequent encounter: Secondary | ICD-10-CM | POA: Diagnosis not present

## 2019-11-16 DIAGNOSIS — F329 Major depressive disorder, single episode, unspecified: Secondary | ICD-10-CM | POA: Diagnosis not present

## 2019-11-16 DIAGNOSIS — F419 Anxiety disorder, unspecified: Secondary | ICD-10-CM | POA: Diagnosis not present

## 2019-11-16 DIAGNOSIS — G629 Polyneuropathy, unspecified: Secondary | ICD-10-CM | POA: Diagnosis not present

## 2019-11-16 DIAGNOSIS — I69354 Hemiplegia and hemiparesis following cerebral infarction affecting left non-dominant side: Secondary | ICD-10-CM | POA: Diagnosis not present

## 2019-11-16 DIAGNOSIS — E7211 Homocystinuria: Secondary | ICD-10-CM | POA: Diagnosis not present

## 2019-11-16 DIAGNOSIS — F10230 Alcohol dependence with withdrawal, uncomplicated: Secondary | ICD-10-CM | POA: Diagnosis not present

## 2019-11-16 DIAGNOSIS — K219 Gastro-esophageal reflux disease without esophagitis: Secondary | ICD-10-CM | POA: Diagnosis not present

## 2019-11-16 DIAGNOSIS — I10 Essential (primary) hypertension: Secondary | ICD-10-CM | POA: Diagnosis not present

## 2019-11-17 ENCOUNTER — Encounter: Payer: Medicare HMO | Admitting: Physical Therapy

## 2019-11-18 DIAGNOSIS — I10 Essential (primary) hypertension: Secondary | ICD-10-CM | POA: Diagnosis not present

## 2019-11-18 DIAGNOSIS — F419 Anxiety disorder, unspecified: Secondary | ICD-10-CM | POA: Diagnosis not present

## 2019-11-18 DIAGNOSIS — S81811D Laceration without foreign body, right lower leg, subsequent encounter: Secondary | ICD-10-CM | POA: Diagnosis not present

## 2019-11-18 DIAGNOSIS — F10230 Alcohol dependence with withdrawal, uncomplicated: Secondary | ICD-10-CM | POA: Diagnosis not present

## 2019-11-18 DIAGNOSIS — K219 Gastro-esophageal reflux disease without esophagitis: Secondary | ICD-10-CM | POA: Diagnosis not present

## 2019-11-18 DIAGNOSIS — E7211 Homocystinuria: Secondary | ICD-10-CM | POA: Diagnosis not present

## 2019-11-18 DIAGNOSIS — G629 Polyneuropathy, unspecified: Secondary | ICD-10-CM | POA: Diagnosis not present

## 2019-11-18 DIAGNOSIS — F329 Major depressive disorder, single episode, unspecified: Secondary | ICD-10-CM | POA: Diagnosis not present

## 2019-11-18 DIAGNOSIS — I69354 Hemiplegia and hemiparesis following cerebral infarction affecting left non-dominant side: Secondary | ICD-10-CM | POA: Diagnosis not present

## 2019-11-23 DIAGNOSIS — F10230 Alcohol dependence with withdrawal, uncomplicated: Secondary | ICD-10-CM | POA: Diagnosis not present

## 2019-11-23 DIAGNOSIS — K219 Gastro-esophageal reflux disease without esophagitis: Secondary | ICD-10-CM | POA: Diagnosis not present

## 2019-11-23 DIAGNOSIS — G629 Polyneuropathy, unspecified: Secondary | ICD-10-CM | POA: Diagnosis not present

## 2019-11-23 DIAGNOSIS — F419 Anxiety disorder, unspecified: Secondary | ICD-10-CM | POA: Diagnosis not present

## 2019-11-23 DIAGNOSIS — E7211 Homocystinuria: Secondary | ICD-10-CM | POA: Diagnosis not present

## 2019-11-23 DIAGNOSIS — F329 Major depressive disorder, single episode, unspecified: Secondary | ICD-10-CM | POA: Diagnosis not present

## 2019-11-23 DIAGNOSIS — S81811D Laceration without foreign body, right lower leg, subsequent encounter: Secondary | ICD-10-CM | POA: Diagnosis not present

## 2019-11-23 DIAGNOSIS — I69354 Hemiplegia and hemiparesis following cerebral infarction affecting left non-dominant side: Secondary | ICD-10-CM | POA: Diagnosis not present

## 2019-11-23 DIAGNOSIS — I10 Essential (primary) hypertension: Secondary | ICD-10-CM | POA: Diagnosis not present

## 2019-11-24 ENCOUNTER — Encounter: Payer: Medicare HMO | Admitting: Physical Therapy

## 2019-11-30 DIAGNOSIS — G629 Polyneuropathy, unspecified: Secondary | ICD-10-CM | POA: Diagnosis not present

## 2019-11-30 DIAGNOSIS — E7211 Homocystinuria: Secondary | ICD-10-CM | POA: Diagnosis not present

## 2019-11-30 DIAGNOSIS — I69354 Hemiplegia and hemiparesis following cerebral infarction affecting left non-dominant side: Secondary | ICD-10-CM | POA: Diagnosis not present

## 2019-11-30 DIAGNOSIS — K219 Gastro-esophageal reflux disease without esophagitis: Secondary | ICD-10-CM | POA: Diagnosis not present

## 2019-11-30 DIAGNOSIS — F329 Major depressive disorder, single episode, unspecified: Secondary | ICD-10-CM | POA: Diagnosis not present

## 2019-11-30 DIAGNOSIS — S81811D Laceration without foreign body, right lower leg, subsequent encounter: Secondary | ICD-10-CM | POA: Diagnosis not present

## 2019-11-30 DIAGNOSIS — I10 Essential (primary) hypertension: Secondary | ICD-10-CM | POA: Diagnosis not present

## 2019-11-30 DIAGNOSIS — F419 Anxiety disorder, unspecified: Secondary | ICD-10-CM | POA: Diagnosis not present

## 2019-11-30 DIAGNOSIS — F10230 Alcohol dependence with withdrawal, uncomplicated: Secondary | ICD-10-CM | POA: Diagnosis not present

## 2019-12-01 ENCOUNTER — Encounter: Payer: Medicare HMO | Admitting: Physical Therapy

## 2019-12-07 DIAGNOSIS — E7211 Homocystinuria: Secondary | ICD-10-CM | POA: Diagnosis not present

## 2019-12-07 DIAGNOSIS — I69354 Hemiplegia and hemiparesis following cerebral infarction affecting left non-dominant side: Secondary | ICD-10-CM | POA: Diagnosis not present

## 2019-12-07 DIAGNOSIS — K219 Gastro-esophageal reflux disease without esophagitis: Secondary | ICD-10-CM | POA: Diagnosis not present

## 2019-12-07 DIAGNOSIS — I10 Essential (primary) hypertension: Secondary | ICD-10-CM | POA: Diagnosis not present

## 2019-12-07 DIAGNOSIS — F10230 Alcohol dependence with withdrawal, uncomplicated: Secondary | ICD-10-CM | POA: Diagnosis not present

## 2019-12-07 DIAGNOSIS — G629 Polyneuropathy, unspecified: Secondary | ICD-10-CM | POA: Diagnosis not present

## 2019-12-07 DIAGNOSIS — F329 Major depressive disorder, single episode, unspecified: Secondary | ICD-10-CM | POA: Diagnosis not present

## 2019-12-07 DIAGNOSIS — F419 Anxiety disorder, unspecified: Secondary | ICD-10-CM | POA: Diagnosis not present

## 2019-12-07 DIAGNOSIS — S81811D Laceration without foreign body, right lower leg, subsequent encounter: Secondary | ICD-10-CM | POA: Diagnosis not present

## 2019-12-08 ENCOUNTER — Encounter: Payer: Medicare HMO | Admitting: Physical Therapy

## 2019-12-09 DIAGNOSIS — F329 Major depressive disorder, single episode, unspecified: Secondary | ICD-10-CM | POA: Diagnosis not present

## 2019-12-09 DIAGNOSIS — G629 Polyneuropathy, unspecified: Secondary | ICD-10-CM | POA: Diagnosis not present

## 2019-12-09 DIAGNOSIS — S81811D Laceration without foreign body, right lower leg, subsequent encounter: Secondary | ICD-10-CM | POA: Diagnosis not present

## 2019-12-09 DIAGNOSIS — K219 Gastro-esophageal reflux disease without esophagitis: Secondary | ICD-10-CM | POA: Diagnosis not present

## 2019-12-09 DIAGNOSIS — F419 Anxiety disorder, unspecified: Secondary | ICD-10-CM | POA: Diagnosis not present

## 2019-12-09 DIAGNOSIS — I69354 Hemiplegia and hemiparesis following cerebral infarction affecting left non-dominant side: Secondary | ICD-10-CM | POA: Diagnosis not present

## 2019-12-09 DIAGNOSIS — I10 Essential (primary) hypertension: Secondary | ICD-10-CM | POA: Diagnosis not present

## 2019-12-09 DIAGNOSIS — F10230 Alcohol dependence with withdrawal, uncomplicated: Secondary | ICD-10-CM | POA: Diagnosis not present

## 2019-12-09 DIAGNOSIS — E7211 Homocystinuria: Secondary | ICD-10-CM | POA: Diagnosis not present

## 2019-12-15 DIAGNOSIS — S81811D Laceration without foreign body, right lower leg, subsequent encounter: Secondary | ICD-10-CM | POA: Diagnosis not present

## 2019-12-15 DIAGNOSIS — I10 Essential (primary) hypertension: Secondary | ICD-10-CM | POA: Diagnosis not present

## 2019-12-15 DIAGNOSIS — G629 Polyneuropathy, unspecified: Secondary | ICD-10-CM | POA: Diagnosis not present

## 2019-12-15 DIAGNOSIS — F329 Major depressive disorder, single episode, unspecified: Secondary | ICD-10-CM | POA: Diagnosis not present

## 2019-12-15 DIAGNOSIS — F419 Anxiety disorder, unspecified: Secondary | ICD-10-CM | POA: Diagnosis not present

## 2019-12-15 DIAGNOSIS — I69354 Hemiplegia and hemiparesis following cerebral infarction affecting left non-dominant side: Secondary | ICD-10-CM | POA: Diagnosis not present

## 2019-12-16 DIAGNOSIS — K219 Gastro-esophageal reflux disease without esophagitis: Secondary | ICD-10-CM | POA: Diagnosis not present

## 2019-12-16 DIAGNOSIS — I10 Essential (primary) hypertension: Secondary | ICD-10-CM | POA: Diagnosis not present

## 2019-12-16 DIAGNOSIS — F419 Anxiety disorder, unspecified: Secondary | ICD-10-CM | POA: Diagnosis not present

## 2019-12-16 DIAGNOSIS — I69354 Hemiplegia and hemiparesis following cerebral infarction affecting left non-dominant side: Secondary | ICD-10-CM | POA: Diagnosis not present

## 2019-12-16 DIAGNOSIS — G629 Polyneuropathy, unspecified: Secondary | ICD-10-CM | POA: Diagnosis not present

## 2019-12-16 DIAGNOSIS — F10230 Alcohol dependence with withdrawal, uncomplicated: Secondary | ICD-10-CM | POA: Diagnosis not present

## 2019-12-16 DIAGNOSIS — S81811D Laceration without foreign body, right lower leg, subsequent encounter: Secondary | ICD-10-CM | POA: Diagnosis not present

## 2019-12-16 DIAGNOSIS — F329 Major depressive disorder, single episode, unspecified: Secondary | ICD-10-CM | POA: Diagnosis not present

## 2019-12-16 DIAGNOSIS — E7211 Homocystinuria: Secondary | ICD-10-CM | POA: Diagnosis not present

## 2019-12-21 DIAGNOSIS — E7211 Homocystinuria: Secondary | ICD-10-CM | POA: Diagnosis not present

## 2019-12-21 DIAGNOSIS — I10 Essential (primary) hypertension: Secondary | ICD-10-CM | POA: Diagnosis not present

## 2019-12-21 DIAGNOSIS — I69354 Hemiplegia and hemiparesis following cerebral infarction affecting left non-dominant side: Secondary | ICD-10-CM | POA: Diagnosis not present

## 2019-12-21 DIAGNOSIS — F419 Anxiety disorder, unspecified: Secondary | ICD-10-CM | POA: Diagnosis not present

## 2019-12-21 DIAGNOSIS — S81811D Laceration without foreign body, right lower leg, subsequent encounter: Secondary | ICD-10-CM | POA: Diagnosis not present

## 2019-12-21 DIAGNOSIS — F329 Major depressive disorder, single episode, unspecified: Secondary | ICD-10-CM | POA: Diagnosis not present

## 2019-12-21 DIAGNOSIS — F10230 Alcohol dependence with withdrawal, uncomplicated: Secondary | ICD-10-CM | POA: Diagnosis not present

## 2019-12-21 DIAGNOSIS — K219 Gastro-esophageal reflux disease without esophagitis: Secondary | ICD-10-CM | POA: Diagnosis not present

## 2019-12-21 DIAGNOSIS — G629 Polyneuropathy, unspecified: Secondary | ICD-10-CM | POA: Diagnosis not present

## 2019-12-29 DIAGNOSIS — I10 Essential (primary) hypertension: Secondary | ICD-10-CM | POA: Diagnosis not present

## 2019-12-29 DIAGNOSIS — F10230 Alcohol dependence with withdrawal, uncomplicated: Secondary | ICD-10-CM | POA: Diagnosis not present

## 2019-12-29 DIAGNOSIS — K219 Gastro-esophageal reflux disease without esophagitis: Secondary | ICD-10-CM | POA: Diagnosis not present

## 2019-12-29 DIAGNOSIS — F329 Major depressive disorder, single episode, unspecified: Secondary | ICD-10-CM | POA: Diagnosis not present

## 2019-12-29 DIAGNOSIS — I69354 Hemiplegia and hemiparesis following cerebral infarction affecting left non-dominant side: Secondary | ICD-10-CM | POA: Diagnosis not present

## 2019-12-29 DIAGNOSIS — G629 Polyneuropathy, unspecified: Secondary | ICD-10-CM | POA: Diagnosis not present

## 2019-12-29 DIAGNOSIS — F419 Anxiety disorder, unspecified: Secondary | ICD-10-CM | POA: Diagnosis not present

## 2019-12-29 DIAGNOSIS — E7211 Homocystinuria: Secondary | ICD-10-CM | POA: Diagnosis not present

## 2019-12-29 DIAGNOSIS — S81811D Laceration without foreign body, right lower leg, subsequent encounter: Secondary | ICD-10-CM | POA: Diagnosis not present

## 2020-01-04 DIAGNOSIS — E7211 Homocystinuria: Secondary | ICD-10-CM | POA: Diagnosis not present

## 2020-01-04 DIAGNOSIS — S81811D Laceration without foreign body, right lower leg, subsequent encounter: Secondary | ICD-10-CM | POA: Diagnosis not present

## 2020-01-04 DIAGNOSIS — F329 Major depressive disorder, single episode, unspecified: Secondary | ICD-10-CM | POA: Diagnosis not present

## 2020-01-04 DIAGNOSIS — F419 Anxiety disorder, unspecified: Secondary | ICD-10-CM | POA: Diagnosis not present

## 2020-01-04 DIAGNOSIS — I10 Essential (primary) hypertension: Secondary | ICD-10-CM | POA: Diagnosis not present

## 2020-01-04 DIAGNOSIS — G629 Polyneuropathy, unspecified: Secondary | ICD-10-CM | POA: Diagnosis not present

## 2020-01-04 DIAGNOSIS — K219 Gastro-esophageal reflux disease without esophagitis: Secondary | ICD-10-CM | POA: Diagnosis not present

## 2020-01-04 DIAGNOSIS — I69354 Hemiplegia and hemiparesis following cerebral infarction affecting left non-dominant side: Secondary | ICD-10-CM | POA: Diagnosis not present

## 2020-01-04 DIAGNOSIS — F10230 Alcohol dependence with withdrawal, uncomplicated: Secondary | ICD-10-CM | POA: Diagnosis not present

## 2020-01-08 DIAGNOSIS — S81811D Laceration without foreign body, right lower leg, subsequent encounter: Secondary | ICD-10-CM | POA: Diagnosis not present

## 2020-01-08 DIAGNOSIS — E7211 Homocystinuria: Secondary | ICD-10-CM | POA: Diagnosis not present

## 2020-01-08 DIAGNOSIS — I69354 Hemiplegia and hemiparesis following cerebral infarction affecting left non-dominant side: Secondary | ICD-10-CM | POA: Diagnosis not present

## 2020-01-08 DIAGNOSIS — K219 Gastro-esophageal reflux disease without esophagitis: Secondary | ICD-10-CM | POA: Diagnosis not present

## 2020-01-08 DIAGNOSIS — F10230 Alcohol dependence with withdrawal, uncomplicated: Secondary | ICD-10-CM | POA: Diagnosis not present

## 2020-01-08 DIAGNOSIS — G629 Polyneuropathy, unspecified: Secondary | ICD-10-CM | POA: Diagnosis not present

## 2020-01-08 DIAGNOSIS — F419 Anxiety disorder, unspecified: Secondary | ICD-10-CM | POA: Diagnosis not present

## 2020-01-08 DIAGNOSIS — F329 Major depressive disorder, single episode, unspecified: Secondary | ICD-10-CM | POA: Diagnosis not present

## 2020-01-08 DIAGNOSIS — I10 Essential (primary) hypertension: Secondary | ICD-10-CM | POA: Diagnosis not present

## 2020-01-12 DIAGNOSIS — F419 Anxiety disorder, unspecified: Secondary | ICD-10-CM | POA: Diagnosis not present

## 2020-01-12 DIAGNOSIS — E7211 Homocystinuria: Secondary | ICD-10-CM | POA: Diagnosis not present

## 2020-01-12 DIAGNOSIS — I10 Essential (primary) hypertension: Secondary | ICD-10-CM | POA: Diagnosis not present

## 2020-01-12 DIAGNOSIS — F329 Major depressive disorder, single episode, unspecified: Secondary | ICD-10-CM | POA: Diagnosis not present

## 2020-01-12 DIAGNOSIS — S81811D Laceration without foreign body, right lower leg, subsequent encounter: Secondary | ICD-10-CM | POA: Diagnosis not present

## 2020-01-12 DIAGNOSIS — I69354 Hemiplegia and hemiparesis following cerebral infarction affecting left non-dominant side: Secondary | ICD-10-CM | POA: Diagnosis not present

## 2020-01-12 DIAGNOSIS — K219 Gastro-esophageal reflux disease without esophagitis: Secondary | ICD-10-CM | POA: Diagnosis not present

## 2020-01-12 DIAGNOSIS — F10230 Alcohol dependence with withdrawal, uncomplicated: Secondary | ICD-10-CM | POA: Diagnosis not present

## 2020-01-12 DIAGNOSIS — G629 Polyneuropathy, unspecified: Secondary | ICD-10-CM | POA: Diagnosis not present

## 2020-01-18 DIAGNOSIS — I10 Essential (primary) hypertension: Secondary | ICD-10-CM | POA: Diagnosis not present

## 2020-01-18 DIAGNOSIS — S81811D Laceration without foreign body, right lower leg, subsequent encounter: Secondary | ICD-10-CM | POA: Diagnosis not present

## 2020-01-18 DIAGNOSIS — I69354 Hemiplegia and hemiparesis following cerebral infarction affecting left non-dominant side: Secondary | ICD-10-CM | POA: Diagnosis not present

## 2020-01-18 DIAGNOSIS — F329 Major depressive disorder, single episode, unspecified: Secondary | ICD-10-CM | POA: Diagnosis not present

## 2020-01-18 DIAGNOSIS — G629 Polyneuropathy, unspecified: Secondary | ICD-10-CM | POA: Diagnosis not present

## 2020-01-18 DIAGNOSIS — F419 Anxiety disorder, unspecified: Secondary | ICD-10-CM | POA: Diagnosis not present

## 2020-01-18 DIAGNOSIS — F10230 Alcohol dependence with withdrawal, uncomplicated: Secondary | ICD-10-CM | POA: Diagnosis not present

## 2020-01-18 DIAGNOSIS — E7211 Homocystinuria: Secondary | ICD-10-CM | POA: Diagnosis not present

## 2020-01-18 DIAGNOSIS — K219 Gastro-esophageal reflux disease without esophagitis: Secondary | ICD-10-CM | POA: Diagnosis not present

## 2020-03-30 DIAGNOSIS — S42212A Unspecified displaced fracture of surgical neck of left humerus, initial encounter for closed fracture: Secondary | ICD-10-CM | POA: Diagnosis not present

## 2020-04-08 DIAGNOSIS — S42212A Unspecified displaced fracture of surgical neck of left humerus, initial encounter for closed fracture: Secondary | ICD-10-CM | POA: Diagnosis not present

## 2020-04-11 DIAGNOSIS — Z9012 Acquired absence of left breast and nipple: Secondary | ICD-10-CM | POA: Diagnosis not present

## 2020-04-11 DIAGNOSIS — Z96641 Presence of right artificial hip joint: Secondary | ICD-10-CM | POA: Diagnosis not present

## 2020-04-11 DIAGNOSIS — Z9181 History of falling: Secondary | ICD-10-CM | POA: Diagnosis not present

## 2020-04-11 DIAGNOSIS — S42212D Unspecified displaced fracture of surgical neck of left humerus, subsequent encounter for fracture with routine healing: Secondary | ICD-10-CM | POA: Diagnosis not present

## 2020-04-11 DIAGNOSIS — I10 Essential (primary) hypertension: Secondary | ICD-10-CM | POA: Diagnosis not present

## 2020-04-11 DIAGNOSIS — Z8673 Personal history of transient ischemic attack (TIA), and cerebral infarction without residual deficits: Secondary | ICD-10-CM | POA: Diagnosis not present

## 2020-04-14 DIAGNOSIS — Z96641 Presence of right artificial hip joint: Secondary | ICD-10-CM | POA: Diagnosis not present

## 2020-04-14 DIAGNOSIS — I10 Essential (primary) hypertension: Secondary | ICD-10-CM | POA: Diagnosis not present

## 2020-04-14 DIAGNOSIS — Z9181 History of falling: Secondary | ICD-10-CM | POA: Diagnosis not present

## 2020-04-14 DIAGNOSIS — Z8673 Personal history of transient ischemic attack (TIA), and cerebral infarction without residual deficits: Secondary | ICD-10-CM | POA: Diagnosis not present

## 2020-04-14 DIAGNOSIS — Z9012 Acquired absence of left breast and nipple: Secondary | ICD-10-CM | POA: Diagnosis not present

## 2020-04-14 DIAGNOSIS — S42212D Unspecified displaced fracture of surgical neck of left humerus, subsequent encounter for fracture with routine healing: Secondary | ICD-10-CM | POA: Diagnosis not present

## 2020-04-19 DIAGNOSIS — S42212D Unspecified displaced fracture of surgical neck of left humerus, subsequent encounter for fracture with routine healing: Secondary | ICD-10-CM | POA: Diagnosis not present

## 2020-04-19 DIAGNOSIS — Z8673 Personal history of transient ischemic attack (TIA), and cerebral infarction without residual deficits: Secondary | ICD-10-CM | POA: Diagnosis not present

## 2020-04-19 DIAGNOSIS — I10 Essential (primary) hypertension: Secondary | ICD-10-CM | POA: Diagnosis not present

## 2020-04-19 DIAGNOSIS — Z96641 Presence of right artificial hip joint: Secondary | ICD-10-CM | POA: Diagnosis not present

## 2020-04-19 DIAGNOSIS — Z9012 Acquired absence of left breast and nipple: Secondary | ICD-10-CM | POA: Diagnosis not present

## 2020-04-19 DIAGNOSIS — Z9181 History of falling: Secondary | ICD-10-CM | POA: Diagnosis not present

## 2020-04-20 DIAGNOSIS — S42212A Unspecified displaced fracture of surgical neck of left humerus, initial encounter for closed fracture: Secondary | ICD-10-CM | POA: Diagnosis not present

## 2020-04-22 DIAGNOSIS — Z8673 Personal history of transient ischemic attack (TIA), and cerebral infarction without residual deficits: Secondary | ICD-10-CM | POA: Diagnosis not present

## 2020-04-22 DIAGNOSIS — Z9181 History of falling: Secondary | ICD-10-CM | POA: Diagnosis not present

## 2020-04-22 DIAGNOSIS — I10 Essential (primary) hypertension: Secondary | ICD-10-CM | POA: Diagnosis not present

## 2020-04-22 DIAGNOSIS — Z96641 Presence of right artificial hip joint: Secondary | ICD-10-CM | POA: Diagnosis not present

## 2020-04-22 DIAGNOSIS — Z9012 Acquired absence of left breast and nipple: Secondary | ICD-10-CM | POA: Diagnosis not present

## 2020-04-22 DIAGNOSIS — S42212D Unspecified displaced fracture of surgical neck of left humerus, subsequent encounter for fracture with routine healing: Secondary | ICD-10-CM | POA: Diagnosis not present

## 2020-04-26 DIAGNOSIS — Z96641 Presence of right artificial hip joint: Secondary | ICD-10-CM | POA: Diagnosis not present

## 2020-04-26 DIAGNOSIS — Z8673 Personal history of transient ischemic attack (TIA), and cerebral infarction without residual deficits: Secondary | ICD-10-CM | POA: Diagnosis not present

## 2020-04-26 DIAGNOSIS — S42212D Unspecified displaced fracture of surgical neck of left humerus, subsequent encounter for fracture with routine healing: Secondary | ICD-10-CM | POA: Diagnosis not present

## 2020-04-26 DIAGNOSIS — Z9012 Acquired absence of left breast and nipple: Secondary | ICD-10-CM | POA: Diagnosis not present

## 2020-04-26 DIAGNOSIS — I10 Essential (primary) hypertension: Secondary | ICD-10-CM | POA: Diagnosis not present

## 2020-04-26 DIAGNOSIS — Z9181 History of falling: Secondary | ICD-10-CM | POA: Diagnosis not present

## 2020-04-28 DIAGNOSIS — Z96641 Presence of right artificial hip joint: Secondary | ICD-10-CM | POA: Diagnosis not present

## 2020-04-28 DIAGNOSIS — Z8673 Personal history of transient ischemic attack (TIA), and cerebral infarction without residual deficits: Secondary | ICD-10-CM | POA: Diagnosis not present

## 2020-04-28 DIAGNOSIS — Z9012 Acquired absence of left breast and nipple: Secondary | ICD-10-CM | POA: Diagnosis not present

## 2020-04-28 DIAGNOSIS — R600 Localized edema: Secondary | ICD-10-CM | POA: Diagnosis not present

## 2020-04-28 DIAGNOSIS — Z9181 History of falling: Secondary | ICD-10-CM | POA: Diagnosis not present

## 2020-04-28 DIAGNOSIS — I10 Essential (primary) hypertension: Secondary | ICD-10-CM | POA: Diagnosis not present

## 2020-04-28 DIAGNOSIS — S42212D Unspecified displaced fracture of surgical neck of left humerus, subsequent encounter for fracture with routine healing: Secondary | ICD-10-CM | POA: Diagnosis not present

## 2020-05-02 DIAGNOSIS — I10 Essential (primary) hypertension: Secondary | ICD-10-CM | POA: Diagnosis not present

## 2020-05-02 DIAGNOSIS — Z8673 Personal history of transient ischemic attack (TIA), and cerebral infarction without residual deficits: Secondary | ICD-10-CM | POA: Diagnosis not present

## 2020-05-02 DIAGNOSIS — Z9181 History of falling: Secondary | ICD-10-CM | POA: Diagnosis not present

## 2020-05-02 DIAGNOSIS — Z9012 Acquired absence of left breast and nipple: Secondary | ICD-10-CM | POA: Diagnosis not present

## 2020-05-02 DIAGNOSIS — Z96641 Presence of right artificial hip joint: Secondary | ICD-10-CM | POA: Diagnosis not present

## 2020-05-02 DIAGNOSIS — S42212D Unspecified displaced fracture of surgical neck of left humerus, subsequent encounter for fracture with routine healing: Secondary | ICD-10-CM | POA: Diagnosis not present

## 2020-05-03 DIAGNOSIS — Z96641 Presence of right artificial hip joint: Secondary | ICD-10-CM | POA: Diagnosis not present

## 2020-05-03 DIAGNOSIS — Z9012 Acquired absence of left breast and nipple: Secondary | ICD-10-CM | POA: Diagnosis not present

## 2020-05-03 DIAGNOSIS — I10 Essential (primary) hypertension: Secondary | ICD-10-CM | POA: Diagnosis not present

## 2020-05-03 DIAGNOSIS — S42212D Unspecified displaced fracture of surgical neck of left humerus, subsequent encounter for fracture with routine healing: Secondary | ICD-10-CM | POA: Diagnosis not present

## 2020-05-03 DIAGNOSIS — Z9181 History of falling: Secondary | ICD-10-CM | POA: Diagnosis not present

## 2020-05-03 DIAGNOSIS — Z8673 Personal history of transient ischemic attack (TIA), and cerebral infarction without residual deficits: Secondary | ICD-10-CM | POA: Diagnosis not present

## 2020-05-04 DIAGNOSIS — S42212A Unspecified displaced fracture of surgical neck of left humerus, initial encounter for closed fracture: Secondary | ICD-10-CM | POA: Diagnosis not present

## 2020-05-05 DIAGNOSIS — S42212D Unspecified displaced fracture of surgical neck of left humerus, subsequent encounter for fracture with routine healing: Secondary | ICD-10-CM | POA: Diagnosis not present

## 2020-05-05 DIAGNOSIS — Z8673 Personal history of transient ischemic attack (TIA), and cerebral infarction without residual deficits: Secondary | ICD-10-CM | POA: Diagnosis not present

## 2020-05-05 DIAGNOSIS — Z9012 Acquired absence of left breast and nipple: Secondary | ICD-10-CM | POA: Diagnosis not present

## 2020-05-05 DIAGNOSIS — Z96641 Presence of right artificial hip joint: Secondary | ICD-10-CM | POA: Diagnosis not present

## 2020-05-05 DIAGNOSIS — I10 Essential (primary) hypertension: Secondary | ICD-10-CM | POA: Diagnosis not present

## 2020-05-05 DIAGNOSIS — Z9181 History of falling: Secondary | ICD-10-CM | POA: Diagnosis not present

## 2020-05-10 DIAGNOSIS — I10 Essential (primary) hypertension: Secondary | ICD-10-CM | POA: Diagnosis not present

## 2020-05-10 DIAGNOSIS — Z96641 Presence of right artificial hip joint: Secondary | ICD-10-CM | POA: Diagnosis not present

## 2020-05-10 DIAGNOSIS — Z9181 History of falling: Secondary | ICD-10-CM | POA: Diagnosis not present

## 2020-05-10 DIAGNOSIS — Z8673 Personal history of transient ischemic attack (TIA), and cerebral infarction without residual deficits: Secondary | ICD-10-CM | POA: Diagnosis not present

## 2020-05-10 DIAGNOSIS — S42212D Unspecified displaced fracture of surgical neck of left humerus, subsequent encounter for fracture with routine healing: Secondary | ICD-10-CM | POA: Diagnosis not present

## 2020-05-10 DIAGNOSIS — Z9012 Acquired absence of left breast and nipple: Secondary | ICD-10-CM | POA: Diagnosis not present

## 2020-05-11 DIAGNOSIS — Z8673 Personal history of transient ischemic attack (TIA), and cerebral infarction without residual deficits: Secondary | ICD-10-CM | POA: Diagnosis not present

## 2020-05-11 DIAGNOSIS — Z9181 History of falling: Secondary | ICD-10-CM | POA: Diagnosis not present

## 2020-05-11 DIAGNOSIS — S42212D Unspecified displaced fracture of surgical neck of left humerus, subsequent encounter for fracture with routine healing: Secondary | ICD-10-CM | POA: Diagnosis not present

## 2020-05-11 DIAGNOSIS — I10 Essential (primary) hypertension: Secondary | ICD-10-CM | POA: Diagnosis not present

## 2020-05-11 DIAGNOSIS — Z96641 Presence of right artificial hip joint: Secondary | ICD-10-CM | POA: Diagnosis not present

## 2020-05-11 DIAGNOSIS — Z9012 Acquired absence of left breast and nipple: Secondary | ICD-10-CM | POA: Diagnosis not present

## 2020-05-16 DIAGNOSIS — Z9012 Acquired absence of left breast and nipple: Secondary | ICD-10-CM | POA: Diagnosis not present

## 2020-05-16 DIAGNOSIS — S42212D Unspecified displaced fracture of surgical neck of left humerus, subsequent encounter for fracture with routine healing: Secondary | ICD-10-CM | POA: Diagnosis not present

## 2020-05-16 DIAGNOSIS — I10 Essential (primary) hypertension: Secondary | ICD-10-CM | POA: Diagnosis not present

## 2020-05-16 DIAGNOSIS — R600 Localized edema: Secondary | ICD-10-CM | POA: Diagnosis not present

## 2020-05-16 DIAGNOSIS — E7211 Homocystinuria: Secondary | ICD-10-CM | POA: Diagnosis not present

## 2020-05-16 DIAGNOSIS — E7800 Pure hypercholesterolemia, unspecified: Secondary | ICD-10-CM | POA: Diagnosis not present

## 2020-05-16 DIAGNOSIS — Z8673 Personal history of transient ischemic attack (TIA), and cerebral infarction without residual deficits: Secondary | ICD-10-CM | POA: Diagnosis not present

## 2020-05-16 DIAGNOSIS — Z9181 History of falling: Secondary | ICD-10-CM | POA: Diagnosis not present

## 2020-05-16 DIAGNOSIS — F325 Major depressive disorder, single episode, in full remission: Secondary | ICD-10-CM | POA: Diagnosis not present

## 2020-05-16 DIAGNOSIS — Z96641 Presence of right artificial hip joint: Secondary | ICD-10-CM | POA: Diagnosis not present

## 2020-05-17 DIAGNOSIS — I10 Essential (primary) hypertension: Secondary | ICD-10-CM | POA: Diagnosis not present

## 2020-05-17 DIAGNOSIS — Z9181 History of falling: Secondary | ICD-10-CM | POA: Diagnosis not present

## 2020-05-17 DIAGNOSIS — Z8673 Personal history of transient ischemic attack (TIA), and cerebral infarction without residual deficits: Secondary | ICD-10-CM | POA: Diagnosis not present

## 2020-05-17 DIAGNOSIS — S42212D Unspecified displaced fracture of surgical neck of left humerus, subsequent encounter for fracture with routine healing: Secondary | ICD-10-CM | POA: Diagnosis not present

## 2020-05-17 DIAGNOSIS — Z96641 Presence of right artificial hip joint: Secondary | ICD-10-CM | POA: Diagnosis not present

## 2020-05-17 DIAGNOSIS — Z9012 Acquired absence of left breast and nipple: Secondary | ICD-10-CM | POA: Diagnosis not present

## 2020-05-19 DIAGNOSIS — S42212D Unspecified displaced fracture of surgical neck of left humerus, subsequent encounter for fracture with routine healing: Secondary | ICD-10-CM | POA: Diagnosis not present

## 2020-05-19 DIAGNOSIS — Z96641 Presence of right artificial hip joint: Secondary | ICD-10-CM | POA: Diagnosis not present

## 2020-05-19 DIAGNOSIS — I10 Essential (primary) hypertension: Secondary | ICD-10-CM | POA: Diagnosis not present

## 2020-05-19 DIAGNOSIS — Z9181 History of falling: Secondary | ICD-10-CM | POA: Diagnosis not present

## 2020-05-19 DIAGNOSIS — Z8673 Personal history of transient ischemic attack (TIA), and cerebral infarction without residual deficits: Secondary | ICD-10-CM | POA: Diagnosis not present

## 2020-05-19 DIAGNOSIS — Z9012 Acquired absence of left breast and nipple: Secondary | ICD-10-CM | POA: Diagnosis not present

## 2020-05-20 DIAGNOSIS — R600 Localized edema: Secondary | ICD-10-CM | POA: Diagnosis not present

## 2020-05-24 DIAGNOSIS — Z9012 Acquired absence of left breast and nipple: Secondary | ICD-10-CM | POA: Diagnosis not present

## 2020-05-24 DIAGNOSIS — Z8673 Personal history of transient ischemic attack (TIA), and cerebral infarction without residual deficits: Secondary | ICD-10-CM | POA: Diagnosis not present

## 2020-05-24 DIAGNOSIS — Z96641 Presence of right artificial hip joint: Secondary | ICD-10-CM | POA: Diagnosis not present

## 2020-05-24 DIAGNOSIS — S42212D Unspecified displaced fracture of surgical neck of left humerus, subsequent encounter for fracture with routine healing: Secondary | ICD-10-CM | POA: Diagnosis not present

## 2020-05-24 DIAGNOSIS — Z9181 History of falling: Secondary | ICD-10-CM | POA: Diagnosis not present

## 2020-05-24 DIAGNOSIS — I10 Essential (primary) hypertension: Secondary | ICD-10-CM | POA: Diagnosis not present

## 2020-05-26 DIAGNOSIS — Z8673 Personal history of transient ischemic attack (TIA), and cerebral infarction without residual deficits: Secondary | ICD-10-CM | POA: Diagnosis not present

## 2020-05-26 DIAGNOSIS — Z96641 Presence of right artificial hip joint: Secondary | ICD-10-CM | POA: Diagnosis not present

## 2020-05-26 DIAGNOSIS — S42212D Unspecified displaced fracture of surgical neck of left humerus, subsequent encounter for fracture with routine healing: Secondary | ICD-10-CM | POA: Diagnosis not present

## 2020-05-26 DIAGNOSIS — Z9012 Acquired absence of left breast and nipple: Secondary | ICD-10-CM | POA: Diagnosis not present

## 2020-05-26 DIAGNOSIS — Z9181 History of falling: Secondary | ICD-10-CM | POA: Diagnosis not present

## 2020-05-26 DIAGNOSIS — I10 Essential (primary) hypertension: Secondary | ICD-10-CM | POA: Diagnosis not present

## 2020-05-30 DIAGNOSIS — Z8673 Personal history of transient ischemic attack (TIA), and cerebral infarction without residual deficits: Secondary | ICD-10-CM | POA: Diagnosis not present

## 2020-05-30 DIAGNOSIS — Z96641 Presence of right artificial hip joint: Secondary | ICD-10-CM | POA: Diagnosis not present

## 2020-05-30 DIAGNOSIS — Z9012 Acquired absence of left breast and nipple: Secondary | ICD-10-CM | POA: Diagnosis not present

## 2020-05-30 DIAGNOSIS — I10 Essential (primary) hypertension: Secondary | ICD-10-CM | POA: Diagnosis not present

## 2020-05-30 DIAGNOSIS — S42212D Unspecified displaced fracture of surgical neck of left humerus, subsequent encounter for fracture with routine healing: Secondary | ICD-10-CM | POA: Diagnosis not present

## 2020-05-30 DIAGNOSIS — Z9181 History of falling: Secondary | ICD-10-CM | POA: Diagnosis not present

## 2020-05-31 DIAGNOSIS — S42212D Unspecified displaced fracture of surgical neck of left humerus, subsequent encounter for fracture with routine healing: Secondary | ICD-10-CM | POA: Diagnosis not present

## 2020-05-31 DIAGNOSIS — Z8673 Personal history of transient ischemic attack (TIA), and cerebral infarction without residual deficits: Secondary | ICD-10-CM | POA: Diagnosis not present

## 2020-05-31 DIAGNOSIS — Z9181 History of falling: Secondary | ICD-10-CM | POA: Diagnosis not present

## 2020-05-31 DIAGNOSIS — Z9012 Acquired absence of left breast and nipple: Secondary | ICD-10-CM | POA: Diagnosis not present

## 2020-05-31 DIAGNOSIS — Z96641 Presence of right artificial hip joint: Secondary | ICD-10-CM | POA: Diagnosis not present

## 2020-05-31 DIAGNOSIS — I10 Essential (primary) hypertension: Secondary | ICD-10-CM | POA: Diagnosis not present

## 2020-06-01 DIAGNOSIS — S42212A Unspecified displaced fracture of surgical neck of left humerus, initial encounter for closed fracture: Secondary | ICD-10-CM | POA: Diagnosis not present

## 2020-06-02 DIAGNOSIS — I10 Essential (primary) hypertension: Secondary | ICD-10-CM | POA: Diagnosis not present

## 2020-06-02 DIAGNOSIS — S42212D Unspecified displaced fracture of surgical neck of left humerus, subsequent encounter for fracture with routine healing: Secondary | ICD-10-CM | POA: Diagnosis not present

## 2020-06-02 DIAGNOSIS — Z9012 Acquired absence of left breast and nipple: Secondary | ICD-10-CM | POA: Diagnosis not present

## 2020-06-02 DIAGNOSIS — Z96641 Presence of right artificial hip joint: Secondary | ICD-10-CM | POA: Diagnosis not present

## 2020-06-02 DIAGNOSIS — Z8673 Personal history of transient ischemic attack (TIA), and cerebral infarction without residual deficits: Secondary | ICD-10-CM | POA: Diagnosis not present

## 2020-06-02 DIAGNOSIS — Z9181 History of falling: Secondary | ICD-10-CM | POA: Diagnosis not present

## 2020-06-09 DIAGNOSIS — S42212D Unspecified displaced fracture of surgical neck of left humerus, subsequent encounter for fracture with routine healing: Secondary | ICD-10-CM | POA: Diagnosis not present

## 2020-06-09 DIAGNOSIS — Z9012 Acquired absence of left breast and nipple: Secondary | ICD-10-CM | POA: Diagnosis not present

## 2020-06-09 DIAGNOSIS — I10 Essential (primary) hypertension: Secondary | ICD-10-CM | POA: Diagnosis not present

## 2020-06-09 DIAGNOSIS — Z96641 Presence of right artificial hip joint: Secondary | ICD-10-CM | POA: Diagnosis not present

## 2020-06-09 DIAGNOSIS — Z9181 History of falling: Secondary | ICD-10-CM | POA: Diagnosis not present

## 2020-06-09 DIAGNOSIS — Z8673 Personal history of transient ischemic attack (TIA), and cerebral infarction without residual deficits: Secondary | ICD-10-CM | POA: Diagnosis not present

## 2020-06-14 DIAGNOSIS — S42212D Unspecified displaced fracture of surgical neck of left humerus, subsequent encounter for fracture with routine healing: Secondary | ICD-10-CM | POA: Diagnosis not present

## 2020-06-14 DIAGNOSIS — M1990 Unspecified osteoarthritis, unspecified site: Secondary | ICD-10-CM | POA: Diagnosis not present

## 2020-06-14 DIAGNOSIS — I10 Essential (primary) hypertension: Secondary | ICD-10-CM | POA: Diagnosis not present

## 2020-06-14 DIAGNOSIS — I69354 Hemiplegia and hemiparesis following cerebral infarction affecting left non-dominant side: Secondary | ICD-10-CM | POA: Diagnosis not present

## 2020-06-14 DIAGNOSIS — Z9012 Acquired absence of left breast and nipple: Secondary | ICD-10-CM | POA: Diagnosis not present

## 2020-06-14 DIAGNOSIS — Z7901 Long term (current) use of anticoagulants: Secondary | ICD-10-CM | POA: Diagnosis not present

## 2020-06-14 DIAGNOSIS — R1310 Dysphagia, unspecified: Secondary | ICD-10-CM | POA: Diagnosis not present

## 2020-06-14 DIAGNOSIS — I4891 Unspecified atrial fibrillation: Secondary | ICD-10-CM | POA: Diagnosis not present

## 2020-06-14 DIAGNOSIS — E119 Type 2 diabetes mellitus without complications: Secondary | ICD-10-CM | POA: Diagnosis not present

## 2020-06-23 DIAGNOSIS — Z7901 Long term (current) use of anticoagulants: Secondary | ICD-10-CM | POA: Diagnosis not present

## 2020-06-23 DIAGNOSIS — E119 Type 2 diabetes mellitus without complications: Secondary | ICD-10-CM | POA: Diagnosis not present

## 2020-06-23 DIAGNOSIS — R1310 Dysphagia, unspecified: Secondary | ICD-10-CM | POA: Diagnosis not present

## 2020-06-23 DIAGNOSIS — I4891 Unspecified atrial fibrillation: Secondary | ICD-10-CM | POA: Diagnosis not present

## 2020-06-23 DIAGNOSIS — S42212D Unspecified displaced fracture of surgical neck of left humerus, subsequent encounter for fracture with routine healing: Secondary | ICD-10-CM | POA: Diagnosis not present

## 2020-06-23 DIAGNOSIS — Z9012 Acquired absence of left breast and nipple: Secondary | ICD-10-CM | POA: Diagnosis not present

## 2020-06-23 DIAGNOSIS — M1990 Unspecified osteoarthritis, unspecified site: Secondary | ICD-10-CM | POA: Diagnosis not present

## 2020-06-23 DIAGNOSIS — I10 Essential (primary) hypertension: Secondary | ICD-10-CM | POA: Diagnosis not present

## 2020-06-23 DIAGNOSIS — I69354 Hemiplegia and hemiparesis following cerebral infarction affecting left non-dominant side: Secondary | ICD-10-CM | POA: Diagnosis not present

## 2020-06-28 DIAGNOSIS — M1990 Unspecified osteoarthritis, unspecified site: Secondary | ICD-10-CM | POA: Diagnosis not present

## 2020-06-28 DIAGNOSIS — I69354 Hemiplegia and hemiparesis following cerebral infarction affecting left non-dominant side: Secondary | ICD-10-CM | POA: Diagnosis not present

## 2020-06-28 DIAGNOSIS — Z9012 Acquired absence of left breast and nipple: Secondary | ICD-10-CM | POA: Diagnosis not present

## 2020-06-28 DIAGNOSIS — I4891 Unspecified atrial fibrillation: Secondary | ICD-10-CM | POA: Diagnosis not present

## 2020-06-28 DIAGNOSIS — Z7901 Long term (current) use of anticoagulants: Secondary | ICD-10-CM | POA: Diagnosis not present

## 2020-06-28 DIAGNOSIS — S42212D Unspecified displaced fracture of surgical neck of left humerus, subsequent encounter for fracture with routine healing: Secondary | ICD-10-CM | POA: Diagnosis not present

## 2020-06-28 DIAGNOSIS — E119 Type 2 diabetes mellitus without complications: Secondary | ICD-10-CM | POA: Diagnosis not present

## 2020-06-28 DIAGNOSIS — R1310 Dysphagia, unspecified: Secondary | ICD-10-CM | POA: Diagnosis not present

## 2020-06-28 DIAGNOSIS — I10 Essential (primary) hypertension: Secondary | ICD-10-CM | POA: Diagnosis not present

## 2020-06-30 DIAGNOSIS — Z7901 Long term (current) use of anticoagulants: Secondary | ICD-10-CM | POA: Diagnosis not present

## 2020-06-30 DIAGNOSIS — I69354 Hemiplegia and hemiparesis following cerebral infarction affecting left non-dominant side: Secondary | ICD-10-CM | POA: Diagnosis not present

## 2020-06-30 DIAGNOSIS — E119 Type 2 diabetes mellitus without complications: Secondary | ICD-10-CM | POA: Diagnosis not present

## 2020-06-30 DIAGNOSIS — M1990 Unspecified osteoarthritis, unspecified site: Secondary | ICD-10-CM | POA: Diagnosis not present

## 2020-06-30 DIAGNOSIS — I4891 Unspecified atrial fibrillation: Secondary | ICD-10-CM | POA: Diagnosis not present

## 2020-06-30 DIAGNOSIS — Z9012 Acquired absence of left breast and nipple: Secondary | ICD-10-CM | POA: Diagnosis not present

## 2020-06-30 DIAGNOSIS — S42212D Unspecified displaced fracture of surgical neck of left humerus, subsequent encounter for fracture with routine healing: Secondary | ICD-10-CM | POA: Diagnosis not present

## 2020-06-30 DIAGNOSIS — I10 Essential (primary) hypertension: Secondary | ICD-10-CM | POA: Diagnosis not present

## 2020-06-30 DIAGNOSIS — R1310 Dysphagia, unspecified: Secondary | ICD-10-CM | POA: Diagnosis not present

## 2020-07-04 DIAGNOSIS — I10 Essential (primary) hypertension: Secondary | ICD-10-CM | POA: Diagnosis not present

## 2020-07-04 DIAGNOSIS — M1990 Unspecified osteoarthritis, unspecified site: Secondary | ICD-10-CM | POA: Diagnosis not present

## 2020-07-04 DIAGNOSIS — I69354 Hemiplegia and hemiparesis following cerebral infarction affecting left non-dominant side: Secondary | ICD-10-CM | POA: Diagnosis not present

## 2020-07-04 DIAGNOSIS — E119 Type 2 diabetes mellitus without complications: Secondary | ICD-10-CM | POA: Diagnosis not present

## 2020-07-04 DIAGNOSIS — Z9012 Acquired absence of left breast and nipple: Secondary | ICD-10-CM | POA: Diagnosis not present

## 2020-07-04 DIAGNOSIS — Z7901 Long term (current) use of anticoagulants: Secondary | ICD-10-CM | POA: Diagnosis not present

## 2020-07-04 DIAGNOSIS — I4891 Unspecified atrial fibrillation: Secondary | ICD-10-CM | POA: Diagnosis not present

## 2020-07-04 DIAGNOSIS — S42212D Unspecified displaced fracture of surgical neck of left humerus, subsequent encounter for fracture with routine healing: Secondary | ICD-10-CM | POA: Diagnosis not present

## 2020-07-04 DIAGNOSIS — R1310 Dysphagia, unspecified: Secondary | ICD-10-CM | POA: Diagnosis not present

## 2020-07-05 DIAGNOSIS — R1310 Dysphagia, unspecified: Secondary | ICD-10-CM | POA: Diagnosis not present

## 2020-07-05 DIAGNOSIS — E119 Type 2 diabetes mellitus without complications: Secondary | ICD-10-CM | POA: Diagnosis not present

## 2020-07-05 DIAGNOSIS — Z7901 Long term (current) use of anticoagulants: Secondary | ICD-10-CM | POA: Diagnosis not present

## 2020-07-05 DIAGNOSIS — Z9012 Acquired absence of left breast and nipple: Secondary | ICD-10-CM | POA: Diagnosis not present

## 2020-07-05 DIAGNOSIS — I10 Essential (primary) hypertension: Secondary | ICD-10-CM | POA: Diagnosis not present

## 2020-07-05 DIAGNOSIS — S42212D Unspecified displaced fracture of surgical neck of left humerus, subsequent encounter for fracture with routine healing: Secondary | ICD-10-CM | POA: Diagnosis not present

## 2020-07-05 DIAGNOSIS — M1990 Unspecified osteoarthritis, unspecified site: Secondary | ICD-10-CM | POA: Diagnosis not present

## 2020-07-05 DIAGNOSIS — I4891 Unspecified atrial fibrillation: Secondary | ICD-10-CM | POA: Diagnosis not present

## 2020-07-05 DIAGNOSIS — I69354 Hemiplegia and hemiparesis following cerebral infarction affecting left non-dominant side: Secondary | ICD-10-CM | POA: Diagnosis not present

## 2020-07-06 DIAGNOSIS — S42212D Unspecified displaced fracture of surgical neck of left humerus, subsequent encounter for fracture with routine healing: Secondary | ICD-10-CM | POA: Diagnosis not present

## 2020-07-06 DIAGNOSIS — R1310 Dysphagia, unspecified: Secondary | ICD-10-CM | POA: Diagnosis not present

## 2020-07-06 DIAGNOSIS — Z7901 Long term (current) use of anticoagulants: Secondary | ICD-10-CM | POA: Diagnosis not present

## 2020-07-06 DIAGNOSIS — I4891 Unspecified atrial fibrillation: Secondary | ICD-10-CM | POA: Diagnosis not present

## 2020-07-06 DIAGNOSIS — I69354 Hemiplegia and hemiparesis following cerebral infarction affecting left non-dominant side: Secondary | ICD-10-CM | POA: Diagnosis not present

## 2020-07-06 DIAGNOSIS — E119 Type 2 diabetes mellitus without complications: Secondary | ICD-10-CM | POA: Diagnosis not present

## 2020-07-06 DIAGNOSIS — M1990 Unspecified osteoarthritis, unspecified site: Secondary | ICD-10-CM | POA: Diagnosis not present

## 2020-07-06 DIAGNOSIS — Z9012 Acquired absence of left breast and nipple: Secondary | ICD-10-CM | POA: Diagnosis not present

## 2020-07-06 DIAGNOSIS — I10 Essential (primary) hypertension: Secondary | ICD-10-CM | POA: Diagnosis not present

## 2020-07-09 DIAGNOSIS — R1310 Dysphagia, unspecified: Secondary | ICD-10-CM | POA: Diagnosis not present

## 2020-07-09 DIAGNOSIS — Z9012 Acquired absence of left breast and nipple: Secondary | ICD-10-CM | POA: Diagnosis not present

## 2020-07-09 DIAGNOSIS — S42212D Unspecified displaced fracture of surgical neck of left humerus, subsequent encounter for fracture with routine healing: Secondary | ICD-10-CM | POA: Diagnosis not present

## 2020-07-09 DIAGNOSIS — E119 Type 2 diabetes mellitus without complications: Secondary | ICD-10-CM | POA: Diagnosis not present

## 2020-07-09 DIAGNOSIS — M1990 Unspecified osteoarthritis, unspecified site: Secondary | ICD-10-CM | POA: Diagnosis not present

## 2020-07-09 DIAGNOSIS — Z7901 Long term (current) use of anticoagulants: Secondary | ICD-10-CM | POA: Diagnosis not present

## 2020-07-09 DIAGNOSIS — I69354 Hemiplegia and hemiparesis following cerebral infarction affecting left non-dominant side: Secondary | ICD-10-CM | POA: Diagnosis not present

## 2020-07-09 DIAGNOSIS — I10 Essential (primary) hypertension: Secondary | ICD-10-CM | POA: Diagnosis not present

## 2020-07-09 DIAGNOSIS — I4891 Unspecified atrial fibrillation: Secondary | ICD-10-CM | POA: Diagnosis not present

## 2020-07-12 DIAGNOSIS — I69354 Hemiplegia and hemiparesis following cerebral infarction affecting left non-dominant side: Secondary | ICD-10-CM | POA: Diagnosis not present

## 2020-07-12 DIAGNOSIS — Z9012 Acquired absence of left breast and nipple: Secondary | ICD-10-CM | POA: Diagnosis not present

## 2020-07-12 DIAGNOSIS — I4891 Unspecified atrial fibrillation: Secondary | ICD-10-CM | POA: Diagnosis not present

## 2020-07-12 DIAGNOSIS — R1310 Dysphagia, unspecified: Secondary | ICD-10-CM | POA: Diagnosis not present

## 2020-07-12 DIAGNOSIS — S42212D Unspecified displaced fracture of surgical neck of left humerus, subsequent encounter for fracture with routine healing: Secondary | ICD-10-CM | POA: Diagnosis not present

## 2020-07-12 DIAGNOSIS — Z7901 Long term (current) use of anticoagulants: Secondary | ICD-10-CM | POA: Diagnosis not present

## 2020-07-12 DIAGNOSIS — M1990 Unspecified osteoarthritis, unspecified site: Secondary | ICD-10-CM | POA: Diagnosis not present

## 2020-07-12 DIAGNOSIS — E119 Type 2 diabetes mellitus without complications: Secondary | ICD-10-CM | POA: Diagnosis not present

## 2020-07-12 DIAGNOSIS — I10 Essential (primary) hypertension: Secondary | ICD-10-CM | POA: Diagnosis not present

## 2020-07-13 DIAGNOSIS — I4891 Unspecified atrial fibrillation: Secondary | ICD-10-CM | POA: Diagnosis not present

## 2020-07-13 DIAGNOSIS — I69354 Hemiplegia and hemiparesis following cerebral infarction affecting left non-dominant side: Secondary | ICD-10-CM | POA: Diagnosis not present

## 2020-07-13 DIAGNOSIS — Z9012 Acquired absence of left breast and nipple: Secondary | ICD-10-CM | POA: Diagnosis not present

## 2020-07-13 DIAGNOSIS — E119 Type 2 diabetes mellitus without complications: Secondary | ICD-10-CM | POA: Diagnosis not present

## 2020-07-13 DIAGNOSIS — S42212D Unspecified displaced fracture of surgical neck of left humerus, subsequent encounter for fracture with routine healing: Secondary | ICD-10-CM | POA: Diagnosis not present

## 2020-07-13 DIAGNOSIS — Z7901 Long term (current) use of anticoagulants: Secondary | ICD-10-CM | POA: Diagnosis not present

## 2020-07-13 DIAGNOSIS — I10 Essential (primary) hypertension: Secondary | ICD-10-CM | POA: Diagnosis not present

## 2020-07-13 DIAGNOSIS — R1310 Dysphagia, unspecified: Secondary | ICD-10-CM | POA: Diagnosis not present

## 2020-07-13 DIAGNOSIS — M1990 Unspecified osteoarthritis, unspecified site: Secondary | ICD-10-CM | POA: Diagnosis not present

## 2020-07-15 DIAGNOSIS — E119 Type 2 diabetes mellitus without complications: Secondary | ICD-10-CM | POA: Diagnosis not present

## 2020-07-15 DIAGNOSIS — Z9012 Acquired absence of left breast and nipple: Secondary | ICD-10-CM | POA: Diagnosis not present

## 2020-07-15 DIAGNOSIS — Z7901 Long term (current) use of anticoagulants: Secondary | ICD-10-CM | POA: Diagnosis not present

## 2020-07-15 DIAGNOSIS — I69354 Hemiplegia and hemiparesis following cerebral infarction affecting left non-dominant side: Secondary | ICD-10-CM | POA: Diagnosis not present

## 2020-07-15 DIAGNOSIS — M1990 Unspecified osteoarthritis, unspecified site: Secondary | ICD-10-CM | POA: Diagnosis not present

## 2020-07-15 DIAGNOSIS — S42212D Unspecified displaced fracture of surgical neck of left humerus, subsequent encounter for fracture with routine healing: Secondary | ICD-10-CM | POA: Diagnosis not present

## 2020-07-15 DIAGNOSIS — I4891 Unspecified atrial fibrillation: Secondary | ICD-10-CM | POA: Diagnosis not present

## 2020-07-15 DIAGNOSIS — I10 Essential (primary) hypertension: Secondary | ICD-10-CM | POA: Diagnosis not present

## 2020-07-15 DIAGNOSIS — R1310 Dysphagia, unspecified: Secondary | ICD-10-CM | POA: Diagnosis not present

## 2020-07-18 DIAGNOSIS — R600 Localized edema: Secondary | ICD-10-CM | POA: Diagnosis not present

## 2020-07-18 DIAGNOSIS — E7211 Homocystinuria: Secondary | ICD-10-CM | POA: Diagnosis not present

## 2020-07-19 DIAGNOSIS — I4891 Unspecified atrial fibrillation: Secondary | ICD-10-CM | POA: Diagnosis not present

## 2020-07-19 DIAGNOSIS — M1990 Unspecified osteoarthritis, unspecified site: Secondary | ICD-10-CM | POA: Diagnosis not present

## 2020-07-19 DIAGNOSIS — I10 Essential (primary) hypertension: Secondary | ICD-10-CM | POA: Diagnosis not present

## 2020-07-19 DIAGNOSIS — Z9012 Acquired absence of left breast and nipple: Secondary | ICD-10-CM | POA: Diagnosis not present

## 2020-07-19 DIAGNOSIS — Z7901 Long term (current) use of anticoagulants: Secondary | ICD-10-CM | POA: Diagnosis not present

## 2020-07-19 DIAGNOSIS — E119 Type 2 diabetes mellitus without complications: Secondary | ICD-10-CM | POA: Diagnosis not present

## 2020-07-19 DIAGNOSIS — I69354 Hemiplegia and hemiparesis following cerebral infarction affecting left non-dominant side: Secondary | ICD-10-CM | POA: Diagnosis not present

## 2020-07-19 DIAGNOSIS — S42212D Unspecified displaced fracture of surgical neck of left humerus, subsequent encounter for fracture with routine healing: Secondary | ICD-10-CM | POA: Diagnosis not present

## 2020-07-19 DIAGNOSIS — R1310 Dysphagia, unspecified: Secondary | ICD-10-CM | POA: Diagnosis not present

## 2020-07-20 DIAGNOSIS — Z9012 Acquired absence of left breast and nipple: Secondary | ICD-10-CM | POA: Diagnosis not present

## 2020-07-20 DIAGNOSIS — R1310 Dysphagia, unspecified: Secondary | ICD-10-CM | POA: Diagnosis not present

## 2020-07-20 DIAGNOSIS — I10 Essential (primary) hypertension: Secondary | ICD-10-CM | POA: Diagnosis not present

## 2020-07-20 DIAGNOSIS — I69354 Hemiplegia and hemiparesis following cerebral infarction affecting left non-dominant side: Secondary | ICD-10-CM | POA: Diagnosis not present

## 2020-07-20 DIAGNOSIS — E119 Type 2 diabetes mellitus without complications: Secondary | ICD-10-CM | POA: Diagnosis not present

## 2020-07-20 DIAGNOSIS — M1990 Unspecified osteoarthritis, unspecified site: Secondary | ICD-10-CM | POA: Diagnosis not present

## 2020-07-20 DIAGNOSIS — I4891 Unspecified atrial fibrillation: Secondary | ICD-10-CM | POA: Diagnosis not present

## 2020-07-20 DIAGNOSIS — Z7901 Long term (current) use of anticoagulants: Secondary | ICD-10-CM | POA: Diagnosis not present

## 2020-07-20 DIAGNOSIS — S42212D Unspecified displaced fracture of surgical neck of left humerus, subsequent encounter for fracture with routine healing: Secondary | ICD-10-CM | POA: Diagnosis not present

## 2020-07-28 DIAGNOSIS — S42212D Unspecified displaced fracture of surgical neck of left humerus, subsequent encounter for fracture with routine healing: Secondary | ICD-10-CM | POA: Diagnosis not present

## 2020-07-28 DIAGNOSIS — I69354 Hemiplegia and hemiparesis following cerebral infarction affecting left non-dominant side: Secondary | ICD-10-CM | POA: Diagnosis not present

## 2020-07-28 DIAGNOSIS — Z7901 Long term (current) use of anticoagulants: Secondary | ICD-10-CM | POA: Diagnosis not present

## 2020-07-28 DIAGNOSIS — M1990 Unspecified osteoarthritis, unspecified site: Secondary | ICD-10-CM | POA: Diagnosis not present

## 2020-07-28 DIAGNOSIS — I10 Essential (primary) hypertension: Secondary | ICD-10-CM | POA: Diagnosis not present

## 2020-07-28 DIAGNOSIS — I4891 Unspecified atrial fibrillation: Secondary | ICD-10-CM | POA: Diagnosis not present

## 2020-07-28 DIAGNOSIS — Z9012 Acquired absence of left breast and nipple: Secondary | ICD-10-CM | POA: Diagnosis not present

## 2020-07-28 DIAGNOSIS — E119 Type 2 diabetes mellitus without complications: Secondary | ICD-10-CM | POA: Diagnosis not present

## 2020-07-28 DIAGNOSIS — R1310 Dysphagia, unspecified: Secondary | ICD-10-CM | POA: Diagnosis not present

## 2020-08-01 DIAGNOSIS — R1310 Dysphagia, unspecified: Secondary | ICD-10-CM | POA: Diagnosis not present

## 2020-08-01 DIAGNOSIS — I4891 Unspecified atrial fibrillation: Secondary | ICD-10-CM | POA: Diagnosis not present

## 2020-08-01 DIAGNOSIS — E119 Type 2 diabetes mellitus without complications: Secondary | ICD-10-CM | POA: Diagnosis not present

## 2020-08-01 DIAGNOSIS — S42212D Unspecified displaced fracture of surgical neck of left humerus, subsequent encounter for fracture with routine healing: Secondary | ICD-10-CM | POA: Diagnosis not present

## 2020-08-01 DIAGNOSIS — Z7901 Long term (current) use of anticoagulants: Secondary | ICD-10-CM | POA: Diagnosis not present

## 2020-08-01 DIAGNOSIS — Z9012 Acquired absence of left breast and nipple: Secondary | ICD-10-CM | POA: Diagnosis not present

## 2020-08-01 DIAGNOSIS — I10 Essential (primary) hypertension: Secondary | ICD-10-CM | POA: Diagnosis not present

## 2020-08-01 DIAGNOSIS — I69354 Hemiplegia and hemiparesis following cerebral infarction affecting left non-dominant side: Secondary | ICD-10-CM | POA: Diagnosis not present

## 2020-08-01 DIAGNOSIS — M1990 Unspecified osteoarthritis, unspecified site: Secondary | ICD-10-CM | POA: Diagnosis not present

## 2020-08-03 DIAGNOSIS — R531 Weakness: Secondary | ICD-10-CM | POA: Diagnosis not present

## 2020-08-03 DIAGNOSIS — Z03818 Encounter for observation for suspected exposure to other biological agents ruled out: Secondary | ICD-10-CM | POA: Diagnosis not present

## 2020-08-03 DIAGNOSIS — J42 Unspecified chronic bronchitis: Secondary | ICD-10-CM | POA: Diagnosis not present

## 2020-08-03 DIAGNOSIS — Z8673 Personal history of transient ischemic attack (TIA), and cerebral infarction without residual deficits: Secondary | ICD-10-CM | POA: Diagnosis not present

## 2020-08-03 DIAGNOSIS — F325 Major depressive disorder, single episode, in full remission: Secondary | ICD-10-CM | POA: Diagnosis not present

## 2020-08-03 DIAGNOSIS — E7211 Homocystinuria: Secondary | ICD-10-CM | POA: Diagnosis not present

## 2020-08-03 DIAGNOSIS — E7800 Pure hypercholesterolemia, unspecified: Secondary | ICD-10-CM | POA: Diagnosis not present

## 2020-08-04 DIAGNOSIS — R1310 Dysphagia, unspecified: Secondary | ICD-10-CM | POA: Diagnosis not present

## 2020-08-04 DIAGNOSIS — Z7901 Long term (current) use of anticoagulants: Secondary | ICD-10-CM | POA: Diagnosis not present

## 2020-08-04 DIAGNOSIS — E119 Type 2 diabetes mellitus without complications: Secondary | ICD-10-CM | POA: Diagnosis not present

## 2020-08-04 DIAGNOSIS — I69354 Hemiplegia and hemiparesis following cerebral infarction affecting left non-dominant side: Secondary | ICD-10-CM | POA: Diagnosis not present

## 2020-08-04 DIAGNOSIS — I4891 Unspecified atrial fibrillation: Secondary | ICD-10-CM | POA: Diagnosis not present

## 2020-08-04 DIAGNOSIS — I10 Essential (primary) hypertension: Secondary | ICD-10-CM | POA: Diagnosis not present

## 2020-08-04 DIAGNOSIS — S42212D Unspecified displaced fracture of surgical neck of left humerus, subsequent encounter for fracture with routine healing: Secondary | ICD-10-CM | POA: Diagnosis not present

## 2020-08-04 DIAGNOSIS — Z9012 Acquired absence of left breast and nipple: Secondary | ICD-10-CM | POA: Diagnosis not present

## 2020-08-04 DIAGNOSIS — M1990 Unspecified osteoarthritis, unspecified site: Secondary | ICD-10-CM | POA: Diagnosis not present

## 2020-08-08 DIAGNOSIS — I4891 Unspecified atrial fibrillation: Secondary | ICD-10-CM | POA: Diagnosis not present

## 2020-08-08 DIAGNOSIS — S42212D Unspecified displaced fracture of surgical neck of left humerus, subsequent encounter for fracture with routine healing: Secondary | ICD-10-CM | POA: Diagnosis not present

## 2020-08-08 DIAGNOSIS — M1990 Unspecified osteoarthritis, unspecified site: Secondary | ICD-10-CM | POA: Diagnosis not present

## 2020-08-08 DIAGNOSIS — I10 Essential (primary) hypertension: Secondary | ICD-10-CM | POA: Diagnosis not present

## 2020-08-08 DIAGNOSIS — Z7901 Long term (current) use of anticoagulants: Secondary | ICD-10-CM | POA: Diagnosis not present

## 2020-08-08 DIAGNOSIS — E119 Type 2 diabetes mellitus without complications: Secondary | ICD-10-CM | POA: Diagnosis not present

## 2020-08-08 DIAGNOSIS — Z9012 Acquired absence of left breast and nipple: Secondary | ICD-10-CM | POA: Diagnosis not present

## 2020-08-08 DIAGNOSIS — R1310 Dysphagia, unspecified: Secondary | ICD-10-CM | POA: Diagnosis not present

## 2020-08-08 DIAGNOSIS — I69354 Hemiplegia and hemiparesis following cerebral infarction affecting left non-dominant side: Secondary | ICD-10-CM | POA: Diagnosis not present

## 2020-08-16 DIAGNOSIS — E785 Hyperlipidemia, unspecified: Secondary | ICD-10-CM | POA: Diagnosis not present

## 2020-08-16 DIAGNOSIS — J42 Unspecified chronic bronchitis: Secondary | ICD-10-CM | POA: Diagnosis not present

## 2020-08-16 DIAGNOSIS — F325 Major depressive disorder, single episode, in full remission: Secondary | ICD-10-CM | POA: Diagnosis not present

## 2020-08-16 DIAGNOSIS — C50919 Malignant neoplasm of unspecified site of unspecified female breast: Secondary | ICD-10-CM | POA: Diagnosis not present

## 2020-08-16 DIAGNOSIS — E7211 Homocystinuria: Secondary | ICD-10-CM | POA: Diagnosis not present

## 2020-08-16 DIAGNOSIS — I5032 Chronic diastolic (congestive) heart failure: Secondary | ICD-10-CM | POA: Diagnosis not present

## 2020-09-06 DIAGNOSIS — I69354 Hemiplegia and hemiparesis following cerebral infarction affecting left non-dominant side: Secondary | ICD-10-CM | POA: Diagnosis not present

## 2020-09-06 DIAGNOSIS — E1151 Type 2 diabetes mellitus with diabetic peripheral angiopathy without gangrene: Secondary | ICD-10-CM | POA: Diagnosis not present

## 2020-09-06 DIAGNOSIS — E1142 Type 2 diabetes mellitus with diabetic polyneuropathy: Secondary | ICD-10-CM | POA: Diagnosis not present

## 2020-09-06 DIAGNOSIS — I10 Essential (primary) hypertension: Secondary | ICD-10-CM | POA: Diagnosis not present

## 2020-09-06 DIAGNOSIS — L97822 Non-pressure chronic ulcer of other part of left lower leg with fat layer exposed: Secondary | ICD-10-CM | POA: Diagnosis not present

## 2020-09-06 DIAGNOSIS — I872 Venous insufficiency (chronic) (peripheral): Secondary | ICD-10-CM | POA: Diagnosis not present

## 2020-09-06 DIAGNOSIS — I4891 Unspecified atrial fibrillation: Secondary | ICD-10-CM | POA: Diagnosis not present

## 2020-09-06 DIAGNOSIS — L97812 Non-pressure chronic ulcer of other part of right lower leg with fat layer exposed: Secondary | ICD-10-CM | POA: Diagnosis not present

## 2020-09-06 DIAGNOSIS — E7211 Homocystinuria: Secondary | ICD-10-CM | POA: Diagnosis not present

## 2020-09-07 DIAGNOSIS — I5032 Chronic diastolic (congestive) heart failure: Secondary | ICD-10-CM | POA: Diagnosis not present

## 2020-09-07 DIAGNOSIS — E7211 Homocystinuria: Secondary | ICD-10-CM | POA: Diagnosis not present

## 2020-09-07 DIAGNOSIS — Z0189 Encounter for other specified special examinations: Secondary | ICD-10-CM | POA: Diagnosis not present

## 2020-09-09 DIAGNOSIS — I872 Venous insufficiency (chronic) (peripheral): Secondary | ICD-10-CM | POA: Diagnosis not present

## 2020-09-09 DIAGNOSIS — E1142 Type 2 diabetes mellitus with diabetic polyneuropathy: Secondary | ICD-10-CM | POA: Diagnosis not present

## 2020-09-09 DIAGNOSIS — E1151 Type 2 diabetes mellitus with diabetic peripheral angiopathy without gangrene: Secondary | ICD-10-CM | POA: Diagnosis not present

## 2020-09-09 DIAGNOSIS — L97822 Non-pressure chronic ulcer of other part of left lower leg with fat layer exposed: Secondary | ICD-10-CM | POA: Diagnosis not present

## 2020-09-09 DIAGNOSIS — I10 Essential (primary) hypertension: Secondary | ICD-10-CM | POA: Diagnosis not present

## 2020-09-09 DIAGNOSIS — E7211 Homocystinuria: Secondary | ICD-10-CM | POA: Diagnosis not present

## 2020-09-09 DIAGNOSIS — I4891 Unspecified atrial fibrillation: Secondary | ICD-10-CM | POA: Diagnosis not present

## 2020-09-09 DIAGNOSIS — I69354 Hemiplegia and hemiparesis following cerebral infarction affecting left non-dominant side: Secondary | ICD-10-CM | POA: Diagnosis not present

## 2020-09-09 DIAGNOSIS — L97812 Non-pressure chronic ulcer of other part of right lower leg with fat layer exposed: Secondary | ICD-10-CM | POA: Diagnosis not present

## 2020-09-12 DIAGNOSIS — E1151 Type 2 diabetes mellitus with diabetic peripheral angiopathy without gangrene: Secondary | ICD-10-CM | POA: Diagnosis not present

## 2020-09-12 DIAGNOSIS — E7211 Homocystinuria: Secondary | ICD-10-CM | POA: Diagnosis not present

## 2020-09-12 DIAGNOSIS — I872 Venous insufficiency (chronic) (peripheral): Secondary | ICD-10-CM | POA: Diagnosis not present

## 2020-09-12 DIAGNOSIS — I10 Essential (primary) hypertension: Secondary | ICD-10-CM | POA: Diagnosis not present

## 2020-09-12 DIAGNOSIS — L97812 Non-pressure chronic ulcer of other part of right lower leg with fat layer exposed: Secondary | ICD-10-CM | POA: Diagnosis not present

## 2020-09-12 DIAGNOSIS — L97822 Non-pressure chronic ulcer of other part of left lower leg with fat layer exposed: Secondary | ICD-10-CM | POA: Diagnosis not present

## 2020-09-12 DIAGNOSIS — I4891 Unspecified atrial fibrillation: Secondary | ICD-10-CM | POA: Diagnosis not present

## 2020-09-12 DIAGNOSIS — E1142 Type 2 diabetes mellitus with diabetic polyneuropathy: Secondary | ICD-10-CM | POA: Diagnosis not present

## 2020-09-12 DIAGNOSIS — I69354 Hemiplegia and hemiparesis following cerebral infarction affecting left non-dominant side: Secondary | ICD-10-CM | POA: Diagnosis not present

## 2020-09-13 DIAGNOSIS — L97822 Non-pressure chronic ulcer of other part of left lower leg with fat layer exposed: Secondary | ICD-10-CM | POA: Diagnosis not present

## 2020-09-13 DIAGNOSIS — E1142 Type 2 diabetes mellitus with diabetic polyneuropathy: Secondary | ICD-10-CM | POA: Diagnosis not present

## 2020-09-13 DIAGNOSIS — E7211 Homocystinuria: Secondary | ICD-10-CM | POA: Diagnosis not present

## 2020-09-13 DIAGNOSIS — E1151 Type 2 diabetes mellitus with diabetic peripheral angiopathy without gangrene: Secondary | ICD-10-CM | POA: Diagnosis not present

## 2020-09-13 DIAGNOSIS — I4891 Unspecified atrial fibrillation: Secondary | ICD-10-CM | POA: Diagnosis not present

## 2020-09-13 DIAGNOSIS — L97812 Non-pressure chronic ulcer of other part of right lower leg with fat layer exposed: Secondary | ICD-10-CM | POA: Diagnosis not present

## 2020-09-13 DIAGNOSIS — I872 Venous insufficiency (chronic) (peripheral): Secondary | ICD-10-CM | POA: Diagnosis not present

## 2020-09-13 DIAGNOSIS — I69354 Hemiplegia and hemiparesis following cerebral infarction affecting left non-dominant side: Secondary | ICD-10-CM | POA: Diagnosis not present

## 2020-09-13 DIAGNOSIS — I10 Essential (primary) hypertension: Secondary | ICD-10-CM | POA: Diagnosis not present

## 2020-09-19 DIAGNOSIS — E1151 Type 2 diabetes mellitus with diabetic peripheral angiopathy without gangrene: Secondary | ICD-10-CM | POA: Diagnosis not present

## 2020-09-19 DIAGNOSIS — E7211 Homocystinuria: Secondary | ICD-10-CM | POA: Diagnosis not present

## 2020-09-19 DIAGNOSIS — I4891 Unspecified atrial fibrillation: Secondary | ICD-10-CM | POA: Diagnosis not present

## 2020-09-19 DIAGNOSIS — I10 Essential (primary) hypertension: Secondary | ICD-10-CM | POA: Diagnosis not present

## 2020-09-19 DIAGNOSIS — I872 Venous insufficiency (chronic) (peripheral): Secondary | ICD-10-CM | POA: Diagnosis not present

## 2020-09-19 DIAGNOSIS — I69354 Hemiplegia and hemiparesis following cerebral infarction affecting left non-dominant side: Secondary | ICD-10-CM | POA: Diagnosis not present

## 2020-09-19 DIAGNOSIS — L97812 Non-pressure chronic ulcer of other part of right lower leg with fat layer exposed: Secondary | ICD-10-CM | POA: Diagnosis not present

## 2020-09-19 DIAGNOSIS — E1142 Type 2 diabetes mellitus with diabetic polyneuropathy: Secondary | ICD-10-CM | POA: Diagnosis not present

## 2020-09-19 DIAGNOSIS — L97822 Non-pressure chronic ulcer of other part of left lower leg with fat layer exposed: Secondary | ICD-10-CM | POA: Diagnosis not present

## 2020-09-20 DIAGNOSIS — I69354 Hemiplegia and hemiparesis following cerebral infarction affecting left non-dominant side: Secondary | ICD-10-CM | POA: Diagnosis not present

## 2020-09-20 DIAGNOSIS — L97812 Non-pressure chronic ulcer of other part of right lower leg with fat layer exposed: Secondary | ICD-10-CM | POA: Diagnosis not present

## 2020-09-20 DIAGNOSIS — I10 Essential (primary) hypertension: Secondary | ICD-10-CM | POA: Diagnosis not present

## 2020-09-20 DIAGNOSIS — E1142 Type 2 diabetes mellitus with diabetic polyneuropathy: Secondary | ICD-10-CM | POA: Diagnosis not present

## 2020-09-20 DIAGNOSIS — I4891 Unspecified atrial fibrillation: Secondary | ICD-10-CM | POA: Diagnosis not present

## 2020-09-20 DIAGNOSIS — L97822 Non-pressure chronic ulcer of other part of left lower leg with fat layer exposed: Secondary | ICD-10-CM | POA: Diagnosis not present

## 2020-09-20 DIAGNOSIS — I872 Venous insufficiency (chronic) (peripheral): Secondary | ICD-10-CM | POA: Diagnosis not present

## 2020-09-20 DIAGNOSIS — E7211 Homocystinuria: Secondary | ICD-10-CM | POA: Diagnosis not present

## 2020-09-20 DIAGNOSIS — E1151 Type 2 diabetes mellitus with diabetic peripheral angiopathy without gangrene: Secondary | ICD-10-CM | POA: Diagnosis not present

## 2020-09-23 DIAGNOSIS — I872 Venous insufficiency (chronic) (peripheral): Secondary | ICD-10-CM | POA: Diagnosis not present

## 2020-09-23 DIAGNOSIS — E1151 Type 2 diabetes mellitus with diabetic peripheral angiopathy without gangrene: Secondary | ICD-10-CM | POA: Diagnosis not present

## 2020-09-23 DIAGNOSIS — I4891 Unspecified atrial fibrillation: Secondary | ICD-10-CM | POA: Diagnosis not present

## 2020-09-23 DIAGNOSIS — I10 Essential (primary) hypertension: Secondary | ICD-10-CM | POA: Diagnosis not present

## 2020-09-23 DIAGNOSIS — E7211 Homocystinuria: Secondary | ICD-10-CM | POA: Diagnosis not present

## 2020-09-23 DIAGNOSIS — L97812 Non-pressure chronic ulcer of other part of right lower leg with fat layer exposed: Secondary | ICD-10-CM | POA: Diagnosis not present

## 2020-09-23 DIAGNOSIS — L97822 Non-pressure chronic ulcer of other part of left lower leg with fat layer exposed: Secondary | ICD-10-CM | POA: Diagnosis not present

## 2020-09-23 DIAGNOSIS — E1142 Type 2 diabetes mellitus with diabetic polyneuropathy: Secondary | ICD-10-CM | POA: Diagnosis not present

## 2020-09-23 DIAGNOSIS — I69354 Hemiplegia and hemiparesis following cerebral infarction affecting left non-dominant side: Secondary | ICD-10-CM | POA: Diagnosis not present

## 2020-09-27 DIAGNOSIS — E7211 Homocystinuria: Secondary | ICD-10-CM | POA: Diagnosis not present

## 2020-09-27 DIAGNOSIS — E1142 Type 2 diabetes mellitus with diabetic polyneuropathy: Secondary | ICD-10-CM | POA: Diagnosis not present

## 2020-09-27 DIAGNOSIS — I872 Venous insufficiency (chronic) (peripheral): Secondary | ICD-10-CM | POA: Diagnosis not present

## 2020-09-27 DIAGNOSIS — L97822 Non-pressure chronic ulcer of other part of left lower leg with fat layer exposed: Secondary | ICD-10-CM | POA: Diagnosis not present

## 2020-09-27 DIAGNOSIS — L97812 Non-pressure chronic ulcer of other part of right lower leg with fat layer exposed: Secondary | ICD-10-CM | POA: Diagnosis not present

## 2020-09-27 DIAGNOSIS — I4891 Unspecified atrial fibrillation: Secondary | ICD-10-CM | POA: Diagnosis not present

## 2020-09-27 DIAGNOSIS — I69354 Hemiplegia and hemiparesis following cerebral infarction affecting left non-dominant side: Secondary | ICD-10-CM | POA: Diagnosis not present

## 2020-09-27 DIAGNOSIS — E1151 Type 2 diabetes mellitus with diabetic peripheral angiopathy without gangrene: Secondary | ICD-10-CM | POA: Diagnosis not present

## 2020-09-27 DIAGNOSIS — I10 Essential (primary) hypertension: Secondary | ICD-10-CM | POA: Diagnosis not present

## 2020-09-28 DIAGNOSIS — E7211 Homocystinuria: Secondary | ICD-10-CM | POA: Diagnosis not present

## 2020-09-28 DIAGNOSIS — I5032 Chronic diastolic (congestive) heart failure: Secondary | ICD-10-CM | POA: Diagnosis not present

## 2020-09-30 DIAGNOSIS — E7211 Homocystinuria: Secondary | ICD-10-CM | POA: Diagnosis not present

## 2020-09-30 DIAGNOSIS — E1142 Type 2 diabetes mellitus with diabetic polyneuropathy: Secondary | ICD-10-CM | POA: Diagnosis not present

## 2020-09-30 DIAGNOSIS — I69354 Hemiplegia and hemiparesis following cerebral infarction affecting left non-dominant side: Secondary | ICD-10-CM | POA: Diagnosis not present

## 2020-09-30 DIAGNOSIS — E1151 Type 2 diabetes mellitus with diabetic peripheral angiopathy without gangrene: Secondary | ICD-10-CM | POA: Diagnosis not present

## 2020-09-30 DIAGNOSIS — L97822 Non-pressure chronic ulcer of other part of left lower leg with fat layer exposed: Secondary | ICD-10-CM | POA: Diagnosis not present

## 2020-09-30 DIAGNOSIS — I4891 Unspecified atrial fibrillation: Secondary | ICD-10-CM | POA: Diagnosis not present

## 2020-09-30 DIAGNOSIS — L97812 Non-pressure chronic ulcer of other part of right lower leg with fat layer exposed: Secondary | ICD-10-CM | POA: Diagnosis not present

## 2020-09-30 DIAGNOSIS — I10 Essential (primary) hypertension: Secondary | ICD-10-CM | POA: Diagnosis not present

## 2020-09-30 DIAGNOSIS — I872 Venous insufficiency (chronic) (peripheral): Secondary | ICD-10-CM | POA: Diagnosis not present

## 2020-10-03 DIAGNOSIS — S42212A Unspecified displaced fracture of surgical neck of left humerus, initial encounter for closed fracture: Secondary | ICD-10-CM | POA: Diagnosis not present

## 2020-10-04 DIAGNOSIS — I4891 Unspecified atrial fibrillation: Secondary | ICD-10-CM | POA: Diagnosis not present

## 2020-10-04 DIAGNOSIS — L97812 Non-pressure chronic ulcer of other part of right lower leg with fat layer exposed: Secondary | ICD-10-CM | POA: Diagnosis not present

## 2020-10-04 DIAGNOSIS — I10 Essential (primary) hypertension: Secondary | ICD-10-CM | POA: Diagnosis not present

## 2020-10-04 DIAGNOSIS — L97822 Non-pressure chronic ulcer of other part of left lower leg with fat layer exposed: Secondary | ICD-10-CM | POA: Diagnosis not present

## 2020-10-04 DIAGNOSIS — E1151 Type 2 diabetes mellitus with diabetic peripheral angiopathy without gangrene: Secondary | ICD-10-CM | POA: Diagnosis not present

## 2020-10-04 DIAGNOSIS — E1142 Type 2 diabetes mellitus with diabetic polyneuropathy: Secondary | ICD-10-CM | POA: Diagnosis not present

## 2020-10-04 DIAGNOSIS — I69354 Hemiplegia and hemiparesis following cerebral infarction affecting left non-dominant side: Secondary | ICD-10-CM | POA: Diagnosis not present

## 2020-10-04 DIAGNOSIS — I872 Venous insufficiency (chronic) (peripheral): Secondary | ICD-10-CM | POA: Diagnosis not present

## 2020-10-04 DIAGNOSIS — E7211 Homocystinuria: Secondary | ICD-10-CM | POA: Diagnosis not present

## 2020-10-07 DIAGNOSIS — E1142 Type 2 diabetes mellitus with diabetic polyneuropathy: Secondary | ICD-10-CM | POA: Diagnosis not present

## 2020-10-07 DIAGNOSIS — L97822 Non-pressure chronic ulcer of other part of left lower leg with fat layer exposed: Secondary | ICD-10-CM | POA: Diagnosis not present

## 2020-10-07 DIAGNOSIS — L97812 Non-pressure chronic ulcer of other part of right lower leg with fat layer exposed: Secondary | ICD-10-CM | POA: Diagnosis not present

## 2020-10-07 DIAGNOSIS — I872 Venous insufficiency (chronic) (peripheral): Secondary | ICD-10-CM | POA: Diagnosis not present

## 2020-10-07 DIAGNOSIS — I10 Essential (primary) hypertension: Secondary | ICD-10-CM | POA: Diagnosis not present

## 2020-10-07 DIAGNOSIS — E7211 Homocystinuria: Secondary | ICD-10-CM | POA: Diagnosis not present

## 2020-10-07 DIAGNOSIS — I69354 Hemiplegia and hemiparesis following cerebral infarction affecting left non-dominant side: Secondary | ICD-10-CM | POA: Diagnosis not present

## 2020-10-07 DIAGNOSIS — E1151 Type 2 diabetes mellitus with diabetic peripheral angiopathy without gangrene: Secondary | ICD-10-CM | POA: Diagnosis not present

## 2020-10-07 DIAGNOSIS — I4891 Unspecified atrial fibrillation: Secondary | ICD-10-CM | POA: Diagnosis not present

## 2020-10-10 ENCOUNTER — Encounter (INDEPENDENT_AMBULATORY_CARE_PROVIDER_SITE_OTHER): Payer: Self-pay | Admitting: Nurse Practitioner

## 2020-10-10 ENCOUNTER — Ambulatory Visit (INDEPENDENT_AMBULATORY_CARE_PROVIDER_SITE_OTHER): Payer: Medicare HMO | Admitting: Nurse Practitioner

## 2020-10-10 ENCOUNTER — Other Ambulatory Visit: Payer: Self-pay

## 2020-10-10 VITALS — BP 120/75 | HR 105 | Resp 16 | Ht 62.0 in | Wt 163.0 lb

## 2020-10-10 DIAGNOSIS — I890 Lymphedema, not elsewhere classified: Secondary | ICD-10-CM | POA: Diagnosis not present

## 2020-10-10 DIAGNOSIS — L03119 Cellulitis of unspecified part of limb: Secondary | ICD-10-CM

## 2020-10-10 DIAGNOSIS — E785 Hyperlipidemia, unspecified: Secondary | ICD-10-CM

## 2020-10-10 NOTE — Progress Notes (Signed)
Subjective:    Patient ID: Tina Sharp, female    DOB: 1966/03/18, 55 y.o.   MRN: [id number] Chief Complaint  Patient presents with  . Follow-up    Bil le edema    Tina Sharp is a 55 year old female that presents today for evaluation of lower extremity swelling and cellulitis.  The patient was last seen in 2019.  The patient has a history of alcoholic liver disease with recent CVA.  She has had cellulitis before in her bilateral lower extremities however in this instance the patient has lower extremity wounds with some drainage.  She has been treated and is currently being treated with an antibiotic by her primary care provider.  Legs today are red and swollen with some shallow ulcerations with evidence of drainage.  She denies any fever or chills.  [facility]   Review of Systems  Cardiovascular: Positive for leg swelling.  Skin: Positive for wound.  All other systems reviewed and are negative.      Objective:   Physical Exam Vitals reviewed.  HENT:     Head: Normocephalic.  Cardiovascular:     Rate and Rhythm: Normal rate.     Pulses: Normal pulses.  Pulmonary:     Effort: Pulmonary effort is normal.  Musculoskeletal:     Right lower leg: Edema present.     Left lower leg: Edema present.  Skin:    Findings: Erythema present.  Neurological:     Mental Status: She is alert and oriented to person, place, and time.     Motor: Weakness present.     Gait: Gait abnormal.  Psychiatric:        Mood and Affect: Mood normal.        Behavior: Behavior normal.        Thought Content: Thought content normal.        Judgment: Judgment normal.     BP 120/75 (BP Location: Right Arm)   Pulse (!) 105   Resp 16   Ht 5\' 2"  (1.575 m)   Wt 163 lb (73.9 kg)   BMI 29.81 kg/m   Past Medical History:  Diagnosis Date  . Anxiety   . Breast cancer ([place]) 01/2014   Invasive lobular carcinoma, 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 ER/ PR+; Her 2 neu not  overexpressed, microscopic positive margin (skin). Oncotype DX, low risk for recurrence.  . Depression   . Genetic screening 05/05/2014   negative /[facility]  . GERD (gastroesophageal reflux disease)   . Hypertension   . 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 Marital status: Divorced    Spouse name: Not on file  . Number of children: Not on file  . Years of education: Not on file  . Highest education level: Not on file  Occupational History  . Not on file  Tobacco Use  . Smoking status: Former Smoker    Packs/day: 0.25    Types: Cigarettes    Quit date: 10/12/2015    Years since quitting: 5.0  . Smokeless tobacco: Never Used  . Tobacco comment: E cig  Vaping Use  . Vaping Use: Never used  Substance and Sexual Activity  . Alcohol use: Not Currently    Alcohol/week: 3.0 standard drinks    Types: 1 Glasses of wine, 1 Cans of beer, 1 Shots of liquor per week    Comment:  social  . Drug use: No  . Sexual activity: Yes  Other Topics Concern  . Not on file  Social History Narrative  . Not on file   Social Determinants of Health   Financial Resource Strain: Not on file  Food Insecurity: Not on file  Transportation Needs: Not on file  Physical Activity: Not on file  Stress: Not on file  Social Connections: Not on file  Intimate Partner Violence: Not on file    Past Surgical History:  Procedure Laterality Date  . ABDOMINAL HYSTERECTOMY  2000   left both ovaries in  . BREAST SURGERY Left 04/08/14   mastectomy  . COLONOSCOPY  06/30/2008   Lucilla Lame, MD; chronic diarrhea. Negative ileal and colon biopsies.   . EP IMPLANTABLE DEVICE N/A 06/14/2015   Procedure: Loop Recorder Insertion;  Surgeon: Evans Lance, MD;  Location: [facility];  Service: Cardiovascular;  Laterality: N/A;  . HIP ARTHROPLASTY Right 08/01/2016   Procedure: ARTHROPLASTY BIPOLAR HIP (HEMIARTHROPLASTY);   Surgeon: Dereck Leep, MD;  Location: [facility];  Service: Orthopedics;  Laterality: Right;  . KYPHOPLASTY N/A 09/16/2018   Procedure: KYPHOPLASTY T12;  Surgeon: Hessie Knows, MD;  Location: [facility];  Service: Orthopedics;  Laterality: N/A;  . LAPAROSCOPIC HYSTERECTOMY  2010   [facility]  . MASTECTOMY Left 2016  . REDUCTION MAMMAPLASTY Right 2016   Lift  . reduction mammoplasty Right 04/08/14   Dr Tula Nakayama  . TEE WITHOUT CARDIOVERSION N/A 06/14/2015   Procedure: TRANSESOPHAGEAL ECHOCARDIOGRAM (TEE);  Surgeon: Josue Hector, MD;  Location: [facility] ENDOSCOPY;  Service: Cardiovascular;  Laterality: N/A;  . TONSILLECTOMY AND ADENOIDECTOMY     pt was 55 years old    Family History  Problem Relation Age of Onset  . Breast cancer Mother 72  . Ovarian cancer Other   . Stroke Paternal Grandmother   . Breast cancer Maternal Grandmother 41  . Cancer Maternal Grandfather 33       prostate  . Prostate cancer Maternal Grandfather     No Known Allergies  CBC Latest Ref Rng & Units 10/09/2019 10/08/2019 10/05/2019  WBC 4.0 - 10.5 K/uL 6.0 4.8 5.8  Hemoglobin 12.0 - 15.0 g/dL 13.0 12.4 13.1  Hematocrit 36.0 - 46.0 % 38.9 37.8 37.6  Platelets 150 - 400 K/uL 180 157 204      CMP     Component Value Date/Time   NA 137 10/09/2019 0546   NA 143 09/08/2015 1521   K 4.4 10/09/2019 0546   CL 101 10/09/2019 0546   CO2 25 10/09/2019 0546   GLUCOSE 99 10/09/2019 0546   BUN 18 10/09/2019 0546   BUN 4 (L) 09/08/2015 1521   CREATININE 0.69 10/09/2019 0546   CREATININE 0.86 05/21/2014 1546   CALCIUM 9.8 10/09/2019 0546   PROT 7.0 10/09/2019 0546   PROT 6.2 09/08/2015 1521   PROT 6.6 05/21/2014 1546   ALBUMIN 3.7 10/09/2019 0546   ALBUMIN 3.4 (L) 09/08/2015 1521   ALBUMIN 3.8 05/21/2014 1546   AST 44 (H) 10/09/2019 0546   AST 125 (H) 05/21/2014 1546   ALT 61 (H) 10/09/2019 0546   ALT 76 (H) 05/21/2014 1546   ALKPHOS 74 10/09/2019 0546   ALKPHOS 109 05/21/2014 1546   BILITOT 0.6 10/09/2019 0546    BILITOT 0.3 09/08/2015 1521   BILITOT 0.3 05/21/2014 1546   GFRNONAA >60 10/09/2019 0546   GFRNONAA >60 05/21/2014 1546   GFRAA >60 10/09/2019 0546   GFRAA >60 05/21/2014 1546  No results found.     Assessment & Plan:   1. Lymphedema No surgery or intervention at this point in time.    I have had a long discussion with the patient regarding venous insufficiency and why it  causes symptoms, specifically venous ulceration . I have discussed with the patient the chronic skin changes that accompany venous insufficiency and the long term sequela such as infection and recurring  ulceration.  Patient will be placed in [employer] which will be changed biweekly  drainage permitting.  In addition, behavioral modification including several periods of elevation of the lower extremities during the day will be continued. Achieving a position with the ankles at heart level was stressed to the patient  The patient is instructed to begin routine exercise, especially walking on a daily basis     2. Cellulitis of lower extremity, unspecified laterality The patient is being treated with antibiotics by her primary care provider.  I discussed causes of cellulitis of the patient and how control of lower extremity edema is essential.  The wraps will certainly help with the control of the patient's lower extremity edema.  Patient is also advised to elevate her lower extremities as much as possible.  3. Hyperlipidemia, unspecified hyperlipidemia type Continue statin as ordered and reviewed, no changes at this time    Current Outpatient Medications on File Prior to Visit  Medication Sig Dispense Refill  . atorvastatin (LIPITOR) 40 MG tablet Take 1 tablet (40 mg total) by mouth daily at 6 PM. 30 tablet 2  . baclofen (LIORESAL) 10 MG tablet Take 10 mg by mouth 3 (three) times daily as needed for muscle spasms.     Marland Kitchen buPROPion (WELLBUTRIN XL) 150 MG 24 hr tablet Take 150 mg by mouth daily.    .  clonazePAM (KLONOPIN) 0.5 MG tablet Take 1 tablet (0.5 mg total) by mouth 2 (two) times daily as needed for anxiety. 4 tablet 0  . cyanocobalamin 1000 MCG tablet Take 1 tablet (1,000 mcg total) by mouth daily. 30 tablet 2  . DULoxetine (CYMBALTA) 60 MG capsule Take 60 mg by mouth daily.    Marland Kitchen gabapentin (NEURONTIN) 600 MG tablet Take 600 mg by mouth 4 (four) times daily.    Marland Kitchen levofloxacin (LEVAQUIN) 250 MG tablet Take by mouth.    . metoprolol succinate (TOPROL-XL) 25 MG 24 hr tablet Take 25 mg by mouth daily.    Marland Kitchen omeprazole (PRILOSEC) 20 MG capsule Take 20 mg by mouth as needed (heart burn).     . potassium chloride (K-DUR) 10 MEQ tablet Take 10 mEq by mouth daily.    Marland Kitchen warfarin (COUMADIN) 1 MG tablet Take 2 mg by mouth every evening.     Marland Kitchen anastrozole (ARIMIDEX) 1 MG tablet Take 1 tablet (1 mg total) by mouth every evening. (Patient not taking: No sig reported) 7 tablet 0   No current facility-administered medications on file prior to visit.    There are no Patient Instructions on file for this visit. No follow-ups on file.   Kris Hartmann, NP

## 2020-10-11 DIAGNOSIS — E1142 Type 2 diabetes mellitus with diabetic polyneuropathy: Secondary | ICD-10-CM | POA: Diagnosis not present

## 2020-10-11 DIAGNOSIS — E7211 Homocystinuria: Secondary | ICD-10-CM | POA: Diagnosis not present

## 2020-10-11 DIAGNOSIS — I4891 Unspecified atrial fibrillation: Secondary | ICD-10-CM | POA: Diagnosis not present

## 2020-10-11 DIAGNOSIS — E1151 Type 2 diabetes mellitus with diabetic peripheral angiopathy without gangrene: Secondary | ICD-10-CM | POA: Diagnosis not present

## 2020-10-11 DIAGNOSIS — L97812 Non-pressure chronic ulcer of other part of right lower leg with fat layer exposed: Secondary | ICD-10-CM | POA: Diagnosis not present

## 2020-10-11 DIAGNOSIS — I872 Venous insufficiency (chronic) (peripheral): Secondary | ICD-10-CM | POA: Diagnosis not present

## 2020-10-11 DIAGNOSIS — I69354 Hemiplegia and hemiparesis following cerebral infarction affecting left non-dominant side: Secondary | ICD-10-CM | POA: Diagnosis not present

## 2020-10-11 DIAGNOSIS — L97822 Non-pressure chronic ulcer of other part of left lower leg with fat layer exposed: Secondary | ICD-10-CM | POA: Diagnosis not present

## 2020-10-11 DIAGNOSIS — I10 Essential (primary) hypertension: Secondary | ICD-10-CM | POA: Diagnosis not present

## 2020-10-14 DIAGNOSIS — I10 Essential (primary) hypertension: Secondary | ICD-10-CM | POA: Diagnosis not present

## 2020-10-14 DIAGNOSIS — I872 Venous insufficiency (chronic) (peripheral): Secondary | ICD-10-CM | POA: Diagnosis not present

## 2020-10-14 DIAGNOSIS — E1142 Type 2 diabetes mellitus with diabetic polyneuropathy: Secondary | ICD-10-CM | POA: Diagnosis not present

## 2020-10-14 DIAGNOSIS — E1151 Type 2 diabetes mellitus with diabetic peripheral angiopathy without gangrene: Secondary | ICD-10-CM | POA: Diagnosis not present

## 2020-10-14 DIAGNOSIS — I4891 Unspecified atrial fibrillation: Secondary | ICD-10-CM | POA: Diagnosis not present

## 2020-10-14 DIAGNOSIS — L97812 Non-pressure chronic ulcer of other part of right lower leg with fat layer exposed: Secondary | ICD-10-CM | POA: Diagnosis not present

## 2020-10-14 DIAGNOSIS — E7211 Homocystinuria: Secondary | ICD-10-CM | POA: Diagnosis not present

## 2020-10-14 DIAGNOSIS — I69354 Hemiplegia and hemiparesis following cerebral infarction affecting left non-dominant side: Secondary | ICD-10-CM | POA: Diagnosis not present

## 2020-10-14 DIAGNOSIS — L97822 Non-pressure chronic ulcer of other part of left lower leg with fat layer exposed: Secondary | ICD-10-CM | POA: Diagnosis not present

## 2020-10-17 DIAGNOSIS — E7211 Homocystinuria: Secondary | ICD-10-CM | POA: Diagnosis not present

## 2020-10-17 DIAGNOSIS — I10 Essential (primary) hypertension: Secondary | ICD-10-CM | POA: Diagnosis not present

## 2020-10-17 DIAGNOSIS — E1142 Type 2 diabetes mellitus with diabetic polyneuropathy: Secondary | ICD-10-CM | POA: Diagnosis not present

## 2020-10-17 DIAGNOSIS — L97812 Non-pressure chronic ulcer of other part of right lower leg with fat layer exposed: Secondary | ICD-10-CM | POA: Diagnosis not present

## 2020-10-17 DIAGNOSIS — E1151 Type 2 diabetes mellitus with diabetic peripheral angiopathy without gangrene: Secondary | ICD-10-CM | POA: Diagnosis not present

## 2020-10-17 DIAGNOSIS — I69354 Hemiplegia and hemiparesis following cerebral infarction affecting left non-dominant side: Secondary | ICD-10-CM | POA: Diagnosis not present

## 2020-10-17 DIAGNOSIS — I872 Venous insufficiency (chronic) (peripheral): Secondary | ICD-10-CM | POA: Diagnosis not present

## 2020-10-17 DIAGNOSIS — L97822 Non-pressure chronic ulcer of other part of left lower leg with fat layer exposed: Secondary | ICD-10-CM | POA: Diagnosis not present

## 2020-10-17 DIAGNOSIS — I4891 Unspecified atrial fibrillation: Secondary | ICD-10-CM | POA: Diagnosis not present

## 2020-10-19 DIAGNOSIS — I872 Venous insufficiency (chronic) (peripheral): Secondary | ICD-10-CM | POA: Diagnosis not present

## 2020-10-19 DIAGNOSIS — E1142 Type 2 diabetes mellitus with diabetic polyneuropathy: Secondary | ICD-10-CM | POA: Diagnosis not present

## 2020-10-19 DIAGNOSIS — E7211 Homocystinuria: Secondary | ICD-10-CM | POA: Diagnosis not present

## 2020-10-19 DIAGNOSIS — I10 Essential (primary) hypertension: Secondary | ICD-10-CM | POA: Diagnosis not present

## 2020-10-19 DIAGNOSIS — L97822 Non-pressure chronic ulcer of other part of left lower leg with fat layer exposed: Secondary | ICD-10-CM | POA: Diagnosis not present

## 2020-10-19 DIAGNOSIS — E1151 Type 2 diabetes mellitus with diabetic peripheral angiopathy without gangrene: Secondary | ICD-10-CM | POA: Diagnosis not present

## 2020-10-19 DIAGNOSIS — I4891 Unspecified atrial fibrillation: Secondary | ICD-10-CM | POA: Diagnosis not present

## 2020-10-19 DIAGNOSIS — L97812 Non-pressure chronic ulcer of other part of right lower leg with fat layer exposed: Secondary | ICD-10-CM | POA: Diagnosis not present

## 2020-10-19 DIAGNOSIS — I69354 Hemiplegia and hemiparesis following cerebral infarction affecting left non-dominant side: Secondary | ICD-10-CM | POA: Diagnosis not present

## 2020-10-20 DIAGNOSIS — L97812 Non-pressure chronic ulcer of other part of right lower leg with fat layer exposed: Secondary | ICD-10-CM | POA: Diagnosis not present

## 2020-10-20 DIAGNOSIS — E1142 Type 2 diabetes mellitus with diabetic polyneuropathy: Secondary | ICD-10-CM | POA: Diagnosis not present

## 2020-10-20 DIAGNOSIS — I872 Venous insufficiency (chronic) (peripheral): Secondary | ICD-10-CM | POA: Diagnosis not present

## 2020-10-20 DIAGNOSIS — E7211 Homocystinuria: Secondary | ICD-10-CM | POA: Diagnosis not present

## 2020-10-20 DIAGNOSIS — E1151 Type 2 diabetes mellitus with diabetic peripheral angiopathy without gangrene: Secondary | ICD-10-CM | POA: Diagnosis not present

## 2020-10-20 DIAGNOSIS — I4891 Unspecified atrial fibrillation: Secondary | ICD-10-CM | POA: Diagnosis not present

## 2020-10-20 DIAGNOSIS — I69354 Hemiplegia and hemiparesis following cerebral infarction affecting left non-dominant side: Secondary | ICD-10-CM | POA: Diagnosis not present

## 2020-10-20 DIAGNOSIS — I10 Essential (primary) hypertension: Secondary | ICD-10-CM | POA: Diagnosis not present

## 2020-10-20 DIAGNOSIS — L97822 Non-pressure chronic ulcer of other part of left lower leg with fat layer exposed: Secondary | ICD-10-CM | POA: Diagnosis not present

## 2020-10-25 DIAGNOSIS — E1151 Type 2 diabetes mellitus with diabetic peripheral angiopathy without gangrene: Secondary | ICD-10-CM | POA: Diagnosis not present

## 2020-10-25 DIAGNOSIS — E7211 Homocystinuria: Secondary | ICD-10-CM | POA: Diagnosis not present

## 2020-10-25 DIAGNOSIS — L97812 Non-pressure chronic ulcer of other part of right lower leg with fat layer exposed: Secondary | ICD-10-CM | POA: Diagnosis not present

## 2020-10-25 DIAGNOSIS — L97822 Non-pressure chronic ulcer of other part of left lower leg with fat layer exposed: Secondary | ICD-10-CM | POA: Diagnosis not present

## 2020-10-25 DIAGNOSIS — I4891 Unspecified atrial fibrillation: Secondary | ICD-10-CM | POA: Diagnosis not present

## 2020-10-25 DIAGNOSIS — I69354 Hemiplegia and hemiparesis following cerebral infarction affecting left non-dominant side: Secondary | ICD-10-CM | POA: Diagnosis not present

## 2020-10-25 DIAGNOSIS — E1142 Type 2 diabetes mellitus with diabetic polyneuropathy: Secondary | ICD-10-CM | POA: Diagnosis not present

## 2020-10-25 DIAGNOSIS — I872 Venous insufficiency (chronic) (peripheral): Secondary | ICD-10-CM | POA: Diagnosis not present

## 2020-10-25 DIAGNOSIS — I10 Essential (primary) hypertension: Secondary | ICD-10-CM | POA: Diagnosis not present

## 2020-10-28 DIAGNOSIS — I5032 Chronic diastolic (congestive) heart failure: Secondary | ICD-10-CM | POA: Diagnosis not present

## 2020-10-28 DIAGNOSIS — E7211 Homocystinuria: Secondary | ICD-10-CM | POA: Diagnosis not present

## 2020-10-28 DIAGNOSIS — M545 Low back pain, unspecified: Secondary | ICD-10-CM | POA: Diagnosis not present

## 2020-10-28 DIAGNOSIS — G8929 Other chronic pain: Secondary | ICD-10-CM | POA: Diagnosis not present

## 2020-10-28 DIAGNOSIS — M40205 Unspecified kyphosis, thoracolumbar region: Secondary | ICD-10-CM | POA: Diagnosis not present

## 2020-11-01 DIAGNOSIS — E7211 Homocystinuria: Secondary | ICD-10-CM | POA: Diagnosis not present

## 2020-11-01 DIAGNOSIS — L97822 Non-pressure chronic ulcer of other part of left lower leg with fat layer exposed: Secondary | ICD-10-CM | POA: Diagnosis not present

## 2020-11-01 DIAGNOSIS — E1151 Type 2 diabetes mellitus with diabetic peripheral angiopathy without gangrene: Secondary | ICD-10-CM | POA: Diagnosis not present

## 2020-11-01 DIAGNOSIS — I4891 Unspecified atrial fibrillation: Secondary | ICD-10-CM | POA: Diagnosis not present

## 2020-11-01 DIAGNOSIS — I872 Venous insufficiency (chronic) (peripheral): Secondary | ICD-10-CM | POA: Diagnosis not present

## 2020-11-01 DIAGNOSIS — I10 Essential (primary) hypertension: Secondary | ICD-10-CM | POA: Diagnosis not present

## 2020-11-01 DIAGNOSIS — E1142 Type 2 diabetes mellitus with diabetic polyneuropathy: Secondary | ICD-10-CM | POA: Diagnosis not present

## 2020-11-01 DIAGNOSIS — I69354 Hemiplegia and hemiparesis following cerebral infarction affecting left non-dominant side: Secondary | ICD-10-CM | POA: Diagnosis not present

## 2020-11-01 DIAGNOSIS — L97812 Non-pressure chronic ulcer of other part of right lower leg with fat layer exposed: Secondary | ICD-10-CM | POA: Diagnosis not present

## 2020-11-04 DIAGNOSIS — I872 Venous insufficiency (chronic) (peripheral): Secondary | ICD-10-CM | POA: Diagnosis not present

## 2020-11-04 DIAGNOSIS — I10 Essential (primary) hypertension: Secondary | ICD-10-CM | POA: Diagnosis not present

## 2020-11-04 DIAGNOSIS — I69354 Hemiplegia and hemiparesis following cerebral infarction affecting left non-dominant side: Secondary | ICD-10-CM | POA: Diagnosis not present

## 2020-11-04 DIAGNOSIS — L97812 Non-pressure chronic ulcer of other part of right lower leg with fat layer exposed: Secondary | ICD-10-CM | POA: Diagnosis not present

## 2020-11-04 DIAGNOSIS — E1151 Type 2 diabetes mellitus with diabetic peripheral angiopathy without gangrene: Secondary | ICD-10-CM | POA: Diagnosis not present

## 2020-11-04 DIAGNOSIS — E7211 Homocystinuria: Secondary | ICD-10-CM | POA: Diagnosis not present

## 2020-11-04 DIAGNOSIS — I4891 Unspecified atrial fibrillation: Secondary | ICD-10-CM | POA: Diagnosis not present

## 2020-11-04 DIAGNOSIS — L97822 Non-pressure chronic ulcer of other part of left lower leg with fat layer exposed: Secondary | ICD-10-CM | POA: Diagnosis not present

## 2020-11-04 DIAGNOSIS — E1142 Type 2 diabetes mellitus with diabetic polyneuropathy: Secondary | ICD-10-CM | POA: Diagnosis not present

## 2020-12-27 DIAGNOSIS — E7211 Homocystinuria: Secondary | ICD-10-CM | POA: Diagnosis not present

## 2020-12-27 DIAGNOSIS — F325 Major depressive disorder, single episode, in full remission: Secondary | ICD-10-CM | POA: Diagnosis not present

## 2020-12-27 DIAGNOSIS — J42 Unspecified chronic bronchitis: Secondary | ICD-10-CM | POA: Diagnosis not present

## 2020-12-27 DIAGNOSIS — I5032 Chronic diastolic (congestive) heart failure: Secondary | ICD-10-CM | POA: Diagnosis not present

## 2021-01-03 DIAGNOSIS — I872 Venous insufficiency (chronic) (peripheral): Secondary | ICD-10-CM | POA: Diagnosis not present

## 2021-01-03 DIAGNOSIS — E1151 Type 2 diabetes mellitus with diabetic peripheral angiopathy without gangrene: Secondary | ICD-10-CM | POA: Diagnosis not present

## 2021-01-03 DIAGNOSIS — I110 Hypertensive heart disease with heart failure: Secondary | ICD-10-CM | POA: Diagnosis not present

## 2021-01-03 DIAGNOSIS — I4891 Unspecified atrial fibrillation: Secondary | ICD-10-CM | POA: Diagnosis not present

## 2021-01-03 DIAGNOSIS — I5032 Chronic diastolic (congestive) heart failure: Secondary | ICD-10-CM | POA: Diagnosis not present

## 2021-01-03 DIAGNOSIS — L97821 Non-pressure chronic ulcer of other part of left lower leg limited to breakdown of skin: Secondary | ICD-10-CM | POA: Diagnosis not present

## 2021-01-03 DIAGNOSIS — L97811 Non-pressure chronic ulcer of other part of right lower leg limited to breakdown of skin: Secondary | ICD-10-CM | POA: Diagnosis not present

## 2021-01-03 DIAGNOSIS — I69354 Hemiplegia and hemiparesis following cerebral infarction affecting left non-dominant side: Secondary | ICD-10-CM | POA: Diagnosis not present

## 2021-01-03 DIAGNOSIS — E1142 Type 2 diabetes mellitus with diabetic polyneuropathy: Secondary | ICD-10-CM | POA: Diagnosis not present

## 2021-01-07 DIAGNOSIS — L97821 Non-pressure chronic ulcer of other part of left lower leg limited to breakdown of skin: Secondary | ICD-10-CM | POA: Diagnosis not present

## 2021-01-07 DIAGNOSIS — I4891 Unspecified atrial fibrillation: Secondary | ICD-10-CM | POA: Diagnosis not present

## 2021-01-07 DIAGNOSIS — E1151 Type 2 diabetes mellitus with diabetic peripheral angiopathy without gangrene: Secondary | ICD-10-CM | POA: Diagnosis not present

## 2021-01-07 DIAGNOSIS — E1142 Type 2 diabetes mellitus with diabetic polyneuropathy: Secondary | ICD-10-CM | POA: Diagnosis not present

## 2021-01-07 DIAGNOSIS — I69354 Hemiplegia and hemiparesis following cerebral infarction affecting left non-dominant side: Secondary | ICD-10-CM | POA: Diagnosis not present

## 2021-01-07 DIAGNOSIS — I872 Venous insufficiency (chronic) (peripheral): Secondary | ICD-10-CM | POA: Diagnosis not present

## 2021-01-07 DIAGNOSIS — I5032 Chronic diastolic (congestive) heart failure: Secondary | ICD-10-CM | POA: Diagnosis not present

## 2021-01-07 DIAGNOSIS — L97811 Non-pressure chronic ulcer of other part of right lower leg limited to breakdown of skin: Secondary | ICD-10-CM | POA: Diagnosis not present

## 2021-01-07 DIAGNOSIS — I110 Hypertensive heart disease with heart failure: Secondary | ICD-10-CM | POA: Diagnosis not present

## 2021-01-10 DIAGNOSIS — I4891 Unspecified atrial fibrillation: Secondary | ICD-10-CM | POA: Diagnosis not present

## 2021-01-10 DIAGNOSIS — I69354 Hemiplegia and hemiparesis following cerebral infarction affecting left non-dominant side: Secondary | ICD-10-CM | POA: Diagnosis not present

## 2021-01-10 DIAGNOSIS — I110 Hypertensive heart disease with heart failure: Secondary | ICD-10-CM | POA: Diagnosis not present

## 2021-01-10 DIAGNOSIS — I5032 Chronic diastolic (congestive) heart failure: Secondary | ICD-10-CM | POA: Diagnosis not present

## 2021-01-10 DIAGNOSIS — E1142 Type 2 diabetes mellitus with diabetic polyneuropathy: Secondary | ICD-10-CM | POA: Diagnosis not present

## 2021-01-10 DIAGNOSIS — E1151 Type 2 diabetes mellitus with diabetic peripheral angiopathy without gangrene: Secondary | ICD-10-CM | POA: Diagnosis not present

## 2021-01-10 DIAGNOSIS — L97811 Non-pressure chronic ulcer of other part of right lower leg limited to breakdown of skin: Secondary | ICD-10-CM | POA: Diagnosis not present

## 2021-01-10 DIAGNOSIS — L97821 Non-pressure chronic ulcer of other part of left lower leg limited to breakdown of skin: Secondary | ICD-10-CM | POA: Diagnosis not present

## 2021-01-10 DIAGNOSIS — I872 Venous insufficiency (chronic) (peripheral): Secondary | ICD-10-CM | POA: Diagnosis not present

## 2021-01-12 DIAGNOSIS — L97821 Non-pressure chronic ulcer of other part of left lower leg limited to breakdown of skin: Secondary | ICD-10-CM | POA: Diagnosis not present

## 2021-01-12 DIAGNOSIS — I872 Venous insufficiency (chronic) (peripheral): Secondary | ICD-10-CM | POA: Diagnosis not present

## 2021-01-12 DIAGNOSIS — L97811 Non-pressure chronic ulcer of other part of right lower leg limited to breakdown of skin: Secondary | ICD-10-CM | POA: Diagnosis not present

## 2021-01-12 DIAGNOSIS — E1142 Type 2 diabetes mellitus with diabetic polyneuropathy: Secondary | ICD-10-CM | POA: Diagnosis not present

## 2021-01-12 DIAGNOSIS — I5032 Chronic diastolic (congestive) heart failure: Secondary | ICD-10-CM | POA: Diagnosis not present

## 2021-01-12 DIAGNOSIS — E1151 Type 2 diabetes mellitus with diabetic peripheral angiopathy without gangrene: Secondary | ICD-10-CM | POA: Diagnosis not present

## 2021-01-12 DIAGNOSIS — I110 Hypertensive heart disease with heart failure: Secondary | ICD-10-CM | POA: Diagnosis not present

## 2021-01-13 DIAGNOSIS — I110 Hypertensive heart disease with heart failure: Secondary | ICD-10-CM | POA: Diagnosis not present

## 2021-01-13 DIAGNOSIS — I872 Venous insufficiency (chronic) (peripheral): Secondary | ICD-10-CM | POA: Diagnosis not present

## 2021-01-13 DIAGNOSIS — L97811 Non-pressure chronic ulcer of other part of right lower leg limited to breakdown of skin: Secondary | ICD-10-CM | POA: Diagnosis not present

## 2021-01-13 DIAGNOSIS — I5032 Chronic diastolic (congestive) heart failure: Secondary | ICD-10-CM | POA: Diagnosis not present

## 2021-01-13 DIAGNOSIS — L97821 Non-pressure chronic ulcer of other part of left lower leg limited to breakdown of skin: Secondary | ICD-10-CM | POA: Diagnosis not present

## 2021-01-13 DIAGNOSIS — E1151 Type 2 diabetes mellitus with diabetic peripheral angiopathy without gangrene: Secondary | ICD-10-CM | POA: Diagnosis not present

## 2021-01-13 DIAGNOSIS — I69354 Hemiplegia and hemiparesis following cerebral infarction affecting left non-dominant side: Secondary | ICD-10-CM | POA: Diagnosis not present

## 2021-01-13 DIAGNOSIS — I4891 Unspecified atrial fibrillation: Secondary | ICD-10-CM | POA: Diagnosis not present

## 2021-01-13 DIAGNOSIS — E1142 Type 2 diabetes mellitus with diabetic polyneuropathy: Secondary | ICD-10-CM | POA: Diagnosis not present

## 2021-01-16 DIAGNOSIS — L97811 Non-pressure chronic ulcer of other part of right lower leg limited to breakdown of skin: Secondary | ICD-10-CM | POA: Diagnosis not present

## 2021-01-16 DIAGNOSIS — L97821 Non-pressure chronic ulcer of other part of left lower leg limited to breakdown of skin: Secondary | ICD-10-CM | POA: Diagnosis not present

## 2021-01-16 DIAGNOSIS — I872 Venous insufficiency (chronic) (peripheral): Secondary | ICD-10-CM | POA: Diagnosis not present

## 2021-01-16 DIAGNOSIS — I4891 Unspecified atrial fibrillation: Secondary | ICD-10-CM | POA: Diagnosis not present

## 2021-01-16 DIAGNOSIS — E1142 Type 2 diabetes mellitus with diabetic polyneuropathy: Secondary | ICD-10-CM | POA: Diagnosis not present

## 2021-01-16 DIAGNOSIS — E1151 Type 2 diabetes mellitus with diabetic peripheral angiopathy without gangrene: Secondary | ICD-10-CM | POA: Diagnosis not present

## 2021-01-16 DIAGNOSIS — I5032 Chronic diastolic (congestive) heart failure: Secondary | ICD-10-CM | POA: Diagnosis not present

## 2021-01-16 DIAGNOSIS — I110 Hypertensive heart disease with heart failure: Secondary | ICD-10-CM | POA: Diagnosis not present

## 2021-01-16 DIAGNOSIS — I69354 Hemiplegia and hemiparesis following cerebral infarction affecting left non-dominant side: Secondary | ICD-10-CM | POA: Diagnosis not present

## 2021-01-20 DIAGNOSIS — E1142 Type 2 diabetes mellitus with diabetic polyneuropathy: Secondary | ICD-10-CM | POA: Diagnosis not present

## 2021-01-20 DIAGNOSIS — E1151 Type 2 diabetes mellitus with diabetic peripheral angiopathy without gangrene: Secondary | ICD-10-CM | POA: Diagnosis not present

## 2021-01-20 DIAGNOSIS — I5032 Chronic diastolic (congestive) heart failure: Secondary | ICD-10-CM | POA: Diagnosis not present

## 2021-01-20 DIAGNOSIS — I872 Venous insufficiency (chronic) (peripheral): Secondary | ICD-10-CM | POA: Diagnosis not present

## 2021-01-20 DIAGNOSIS — I110 Hypertensive heart disease with heart failure: Secondary | ICD-10-CM | POA: Diagnosis not present

## 2021-01-20 DIAGNOSIS — I69354 Hemiplegia and hemiparesis following cerebral infarction affecting left non-dominant side: Secondary | ICD-10-CM | POA: Diagnosis not present

## 2021-01-20 DIAGNOSIS — I4891 Unspecified atrial fibrillation: Secondary | ICD-10-CM | POA: Diagnosis not present

## 2021-01-20 DIAGNOSIS — L97821 Non-pressure chronic ulcer of other part of left lower leg limited to breakdown of skin: Secondary | ICD-10-CM | POA: Diagnosis not present

## 2021-01-20 DIAGNOSIS — L97811 Non-pressure chronic ulcer of other part of right lower leg limited to breakdown of skin: Secondary | ICD-10-CM | POA: Diagnosis not present

## 2021-01-27 DIAGNOSIS — E1151 Type 2 diabetes mellitus with diabetic peripheral angiopathy without gangrene: Secondary | ICD-10-CM | POA: Diagnosis not present

## 2021-01-27 DIAGNOSIS — L97821 Non-pressure chronic ulcer of other part of left lower leg limited to breakdown of skin: Secondary | ICD-10-CM | POA: Diagnosis not present

## 2021-01-27 DIAGNOSIS — I110 Hypertensive heart disease with heart failure: Secondary | ICD-10-CM | POA: Diagnosis not present

## 2021-01-27 DIAGNOSIS — I5032 Chronic diastolic (congestive) heart failure: Secondary | ICD-10-CM | POA: Diagnosis not present

## 2021-01-27 DIAGNOSIS — E1142 Type 2 diabetes mellitus with diabetic polyneuropathy: Secondary | ICD-10-CM | POA: Diagnosis not present

## 2021-01-27 DIAGNOSIS — I872 Venous insufficiency (chronic) (peripheral): Secondary | ICD-10-CM | POA: Diagnosis not present

## 2021-01-27 DIAGNOSIS — I4891 Unspecified atrial fibrillation: Secondary | ICD-10-CM | POA: Diagnosis not present

## 2021-01-27 DIAGNOSIS — I69354 Hemiplegia and hemiparesis following cerebral infarction affecting left non-dominant side: Secondary | ICD-10-CM | POA: Diagnosis not present

## 2021-01-27 DIAGNOSIS — L97811 Non-pressure chronic ulcer of other part of right lower leg limited to breakdown of skin: Secondary | ICD-10-CM | POA: Diagnosis not present

## 2021-02-03 DIAGNOSIS — L97811 Non-pressure chronic ulcer of other part of right lower leg limited to breakdown of skin: Secondary | ICD-10-CM | POA: Diagnosis not present

## 2021-02-03 DIAGNOSIS — E1151 Type 2 diabetes mellitus with diabetic peripheral angiopathy without gangrene: Secondary | ICD-10-CM | POA: Diagnosis not present

## 2021-02-03 DIAGNOSIS — I4891 Unspecified atrial fibrillation: Secondary | ICD-10-CM | POA: Diagnosis not present

## 2021-02-03 DIAGNOSIS — I5032 Chronic diastolic (congestive) heart failure: Secondary | ICD-10-CM | POA: Diagnosis not present

## 2021-02-03 DIAGNOSIS — I69354 Hemiplegia and hemiparesis following cerebral infarction affecting left non-dominant side: Secondary | ICD-10-CM | POA: Diagnosis not present

## 2021-02-03 DIAGNOSIS — L97821 Non-pressure chronic ulcer of other part of left lower leg limited to breakdown of skin: Secondary | ICD-10-CM | POA: Diagnosis not present

## 2021-02-03 DIAGNOSIS — E1142 Type 2 diabetes mellitus with diabetic polyneuropathy: Secondary | ICD-10-CM | POA: Diagnosis not present

## 2021-02-03 DIAGNOSIS — I110 Hypertensive heart disease with heart failure: Secondary | ICD-10-CM | POA: Diagnosis not present

## 2021-02-03 DIAGNOSIS — I872 Venous insufficiency (chronic) (peripheral): Secondary | ICD-10-CM | POA: Diagnosis not present

## 2021-02-10 DIAGNOSIS — E1151 Type 2 diabetes mellitus with diabetic peripheral angiopathy without gangrene: Secondary | ICD-10-CM | POA: Diagnosis not present

## 2021-02-10 DIAGNOSIS — E1142 Type 2 diabetes mellitus with diabetic polyneuropathy: Secondary | ICD-10-CM | POA: Diagnosis not present

## 2021-02-10 DIAGNOSIS — I69354 Hemiplegia and hemiparesis following cerebral infarction affecting left non-dominant side: Secondary | ICD-10-CM | POA: Diagnosis not present

## 2021-02-10 DIAGNOSIS — L97811 Non-pressure chronic ulcer of other part of right lower leg limited to breakdown of skin: Secondary | ICD-10-CM | POA: Diagnosis not present

## 2021-02-10 DIAGNOSIS — L97821 Non-pressure chronic ulcer of other part of left lower leg limited to breakdown of skin: Secondary | ICD-10-CM | POA: Diagnosis not present

## 2021-02-10 DIAGNOSIS — I872 Venous insufficiency (chronic) (peripheral): Secondary | ICD-10-CM | POA: Diagnosis not present

## 2021-02-10 DIAGNOSIS — I4891 Unspecified atrial fibrillation: Secondary | ICD-10-CM | POA: Diagnosis not present

## 2021-02-10 DIAGNOSIS — I5032 Chronic diastolic (congestive) heart failure: Secondary | ICD-10-CM | POA: Diagnosis not present

## 2021-02-10 DIAGNOSIS — I110 Hypertensive heart disease with heart failure: Secondary | ICD-10-CM | POA: Diagnosis not present

## 2021-02-23 DIAGNOSIS — I5032 Chronic diastolic (congestive) heart failure: Secondary | ICD-10-CM | POA: Diagnosis not present

## 2021-02-23 DIAGNOSIS — E1142 Type 2 diabetes mellitus with diabetic polyneuropathy: Secondary | ICD-10-CM | POA: Diagnosis not present

## 2021-02-23 DIAGNOSIS — I4891 Unspecified atrial fibrillation: Secondary | ICD-10-CM | POA: Diagnosis not present

## 2021-02-23 DIAGNOSIS — E1151 Type 2 diabetes mellitus with diabetic peripheral angiopathy without gangrene: Secondary | ICD-10-CM | POA: Diagnosis not present

## 2021-02-23 DIAGNOSIS — I110 Hypertensive heart disease with heart failure: Secondary | ICD-10-CM | POA: Diagnosis not present

## 2021-02-23 DIAGNOSIS — L97811 Non-pressure chronic ulcer of other part of right lower leg limited to breakdown of skin: Secondary | ICD-10-CM | POA: Diagnosis not present

## 2021-02-23 DIAGNOSIS — I69354 Hemiplegia and hemiparesis following cerebral infarction affecting left non-dominant side: Secondary | ICD-10-CM | POA: Diagnosis not present

## 2021-02-23 DIAGNOSIS — L97821 Non-pressure chronic ulcer of other part of left lower leg limited to breakdown of skin: Secondary | ICD-10-CM | POA: Diagnosis not present

## 2021-02-23 DIAGNOSIS — I872 Venous insufficiency (chronic) (peripheral): Secondary | ICD-10-CM | POA: Diagnosis not present

## 2021-02-28 DIAGNOSIS — E1151 Type 2 diabetes mellitus with diabetic peripheral angiopathy without gangrene: Secondary | ICD-10-CM | POA: Diagnosis not present

## 2021-02-28 DIAGNOSIS — L97811 Non-pressure chronic ulcer of other part of right lower leg limited to breakdown of skin: Secondary | ICD-10-CM | POA: Diagnosis not present

## 2021-02-28 DIAGNOSIS — L97821 Non-pressure chronic ulcer of other part of left lower leg limited to breakdown of skin: Secondary | ICD-10-CM | POA: Diagnosis not present

## 2021-02-28 DIAGNOSIS — I5032 Chronic diastolic (congestive) heart failure: Secondary | ICD-10-CM | POA: Diagnosis not present

## 2021-02-28 DIAGNOSIS — I69354 Hemiplegia and hemiparesis following cerebral infarction affecting left non-dominant side: Secondary | ICD-10-CM | POA: Diagnosis not present

## 2021-02-28 DIAGNOSIS — I110 Hypertensive heart disease with heart failure: Secondary | ICD-10-CM | POA: Diagnosis not present

## 2021-02-28 DIAGNOSIS — I872 Venous insufficiency (chronic) (peripheral): Secondary | ICD-10-CM | POA: Diagnosis not present

## 2021-02-28 DIAGNOSIS — E1142 Type 2 diabetes mellitus with diabetic polyneuropathy: Secondary | ICD-10-CM | POA: Diagnosis not present

## 2021-02-28 DIAGNOSIS — I4891 Unspecified atrial fibrillation: Secondary | ICD-10-CM | POA: Diagnosis not present

## 2021-03-02 DIAGNOSIS — I110 Hypertensive heart disease with heart failure: Secondary | ICD-10-CM | POA: Diagnosis not present

## 2021-03-02 DIAGNOSIS — L97821 Non-pressure chronic ulcer of other part of left lower leg limited to breakdown of skin: Secondary | ICD-10-CM | POA: Diagnosis not present

## 2021-03-02 DIAGNOSIS — I872 Venous insufficiency (chronic) (peripheral): Secondary | ICD-10-CM | POA: Diagnosis not present

## 2021-03-02 DIAGNOSIS — L97811 Non-pressure chronic ulcer of other part of right lower leg limited to breakdown of skin: Secondary | ICD-10-CM | POA: Diagnosis not present

## 2021-03-02 DIAGNOSIS — I5032 Chronic diastolic (congestive) heart failure: Secondary | ICD-10-CM | POA: Diagnosis not present

## 2021-03-02 DIAGNOSIS — I69354 Hemiplegia and hemiparesis following cerebral infarction affecting left non-dominant side: Secondary | ICD-10-CM | POA: Diagnosis not present

## 2021-03-02 DIAGNOSIS — I4891 Unspecified atrial fibrillation: Secondary | ICD-10-CM | POA: Diagnosis not present

## 2021-03-02 DIAGNOSIS — E1142 Type 2 diabetes mellitus with diabetic polyneuropathy: Secondary | ICD-10-CM | POA: Diagnosis not present

## 2021-03-02 DIAGNOSIS — E1151 Type 2 diabetes mellitus with diabetic peripheral angiopathy without gangrene: Secondary | ICD-10-CM | POA: Diagnosis not present

## 2021-03-08 DIAGNOSIS — I110 Hypertensive heart disease with heart failure: Secondary | ICD-10-CM | POA: Diagnosis not present

## 2021-03-08 DIAGNOSIS — E1151 Type 2 diabetes mellitus with diabetic peripheral angiopathy without gangrene: Secondary | ICD-10-CM | POA: Diagnosis not present

## 2021-03-08 DIAGNOSIS — E1142 Type 2 diabetes mellitus with diabetic polyneuropathy: Secondary | ICD-10-CM | POA: Diagnosis not present

## 2021-03-08 DIAGNOSIS — I872 Venous insufficiency (chronic) (peripheral): Secondary | ICD-10-CM | POA: Diagnosis not present

## 2021-03-08 DIAGNOSIS — I5032 Chronic diastolic (congestive) heart failure: Secondary | ICD-10-CM | POA: Diagnosis not present

## 2021-03-08 DIAGNOSIS — L97812 Non-pressure chronic ulcer of other part of right lower leg with fat layer exposed: Secondary | ICD-10-CM | POA: Diagnosis not present

## 2021-03-08 DIAGNOSIS — I4891 Unspecified atrial fibrillation: Secondary | ICD-10-CM | POA: Diagnosis not present

## 2021-03-09 DIAGNOSIS — L97812 Non-pressure chronic ulcer of other part of right lower leg with fat layer exposed: Secondary | ICD-10-CM | POA: Diagnosis not present

## 2021-03-09 DIAGNOSIS — E1151 Type 2 diabetes mellitus with diabetic peripheral angiopathy without gangrene: Secondary | ICD-10-CM | POA: Diagnosis not present

## 2021-03-09 DIAGNOSIS — I872 Venous insufficiency (chronic) (peripheral): Secondary | ICD-10-CM | POA: Diagnosis not present

## 2021-03-09 DIAGNOSIS — I4891 Unspecified atrial fibrillation: Secondary | ICD-10-CM | POA: Diagnosis not present

## 2021-03-09 DIAGNOSIS — I2510 Atherosclerotic heart disease of native coronary artery without angina pectoris: Secondary | ICD-10-CM | POA: Diagnosis not present

## 2021-03-09 DIAGNOSIS — E1142 Type 2 diabetes mellitus with diabetic polyneuropathy: Secondary | ICD-10-CM | POA: Diagnosis not present

## 2021-03-09 DIAGNOSIS — I110 Hypertensive heart disease with heart failure: Secondary | ICD-10-CM | POA: Diagnosis not present

## 2021-03-09 DIAGNOSIS — I69354 Hemiplegia and hemiparesis following cerebral infarction affecting left non-dominant side: Secondary | ICD-10-CM | POA: Diagnosis not present

## 2021-03-09 DIAGNOSIS — I5032 Chronic diastolic (congestive) heart failure: Secondary | ICD-10-CM | POA: Diagnosis not present

## 2021-03-11 DIAGNOSIS — L97812 Non-pressure chronic ulcer of other part of right lower leg with fat layer exposed: Secondary | ICD-10-CM | POA: Diagnosis not present

## 2021-03-11 DIAGNOSIS — E1151 Type 2 diabetes mellitus with diabetic peripheral angiopathy without gangrene: Secondary | ICD-10-CM | POA: Diagnosis not present

## 2021-03-11 DIAGNOSIS — I872 Venous insufficiency (chronic) (peripheral): Secondary | ICD-10-CM | POA: Diagnosis not present

## 2021-03-11 DIAGNOSIS — I5032 Chronic diastolic (congestive) heart failure: Secondary | ICD-10-CM | POA: Diagnosis not present

## 2021-03-11 DIAGNOSIS — I2510 Atherosclerotic heart disease of native coronary artery without angina pectoris: Secondary | ICD-10-CM | POA: Diagnosis not present

## 2021-03-11 DIAGNOSIS — E1142 Type 2 diabetes mellitus with diabetic polyneuropathy: Secondary | ICD-10-CM | POA: Diagnosis not present

## 2021-03-11 DIAGNOSIS — I4891 Unspecified atrial fibrillation: Secondary | ICD-10-CM | POA: Diagnosis not present

## 2021-03-11 DIAGNOSIS — I110 Hypertensive heart disease with heart failure: Secondary | ICD-10-CM | POA: Diagnosis not present

## 2021-03-11 DIAGNOSIS — I69354 Hemiplegia and hemiparesis following cerebral infarction affecting left non-dominant side: Secondary | ICD-10-CM | POA: Diagnosis not present

## 2021-03-16 DIAGNOSIS — I110 Hypertensive heart disease with heart failure: Secondary | ICD-10-CM | POA: Diagnosis not present

## 2021-03-16 DIAGNOSIS — E1142 Type 2 diabetes mellitus with diabetic polyneuropathy: Secondary | ICD-10-CM | POA: Diagnosis not present

## 2021-03-16 DIAGNOSIS — I4891 Unspecified atrial fibrillation: Secondary | ICD-10-CM | POA: Diagnosis not present

## 2021-03-16 DIAGNOSIS — I5032 Chronic diastolic (congestive) heart failure: Secondary | ICD-10-CM | POA: Diagnosis not present

## 2021-03-16 DIAGNOSIS — I872 Venous insufficiency (chronic) (peripheral): Secondary | ICD-10-CM | POA: Diagnosis not present

## 2021-03-16 DIAGNOSIS — I69354 Hemiplegia and hemiparesis following cerebral infarction affecting left non-dominant side: Secondary | ICD-10-CM | POA: Diagnosis not present

## 2021-03-16 DIAGNOSIS — I2510 Atherosclerotic heart disease of native coronary artery without angina pectoris: Secondary | ICD-10-CM | POA: Diagnosis not present

## 2021-03-16 DIAGNOSIS — E1151 Type 2 diabetes mellitus with diabetic peripheral angiopathy without gangrene: Secondary | ICD-10-CM | POA: Diagnosis not present

## 2021-03-16 DIAGNOSIS — L97812 Non-pressure chronic ulcer of other part of right lower leg with fat layer exposed: Secondary | ICD-10-CM | POA: Diagnosis not present

## 2021-03-23 DIAGNOSIS — I4891 Unspecified atrial fibrillation: Secondary | ICD-10-CM | POA: Diagnosis not present

## 2021-03-23 DIAGNOSIS — I69354 Hemiplegia and hemiparesis following cerebral infarction affecting left non-dominant side: Secondary | ICD-10-CM | POA: Diagnosis not present

## 2021-03-23 DIAGNOSIS — E1142 Type 2 diabetes mellitus with diabetic polyneuropathy: Secondary | ICD-10-CM | POA: Diagnosis not present

## 2021-03-23 DIAGNOSIS — I110 Hypertensive heart disease with heart failure: Secondary | ICD-10-CM | POA: Diagnosis not present

## 2021-03-23 DIAGNOSIS — I872 Venous insufficiency (chronic) (peripheral): Secondary | ICD-10-CM | POA: Diagnosis not present

## 2021-03-23 DIAGNOSIS — I5032 Chronic diastolic (congestive) heart failure: Secondary | ICD-10-CM | POA: Diagnosis not present

## 2021-03-23 DIAGNOSIS — I2510 Atherosclerotic heart disease of native coronary artery without angina pectoris: Secondary | ICD-10-CM | POA: Diagnosis not present

## 2021-03-23 DIAGNOSIS — E1151 Type 2 diabetes mellitus with diabetic peripheral angiopathy without gangrene: Secondary | ICD-10-CM | POA: Diagnosis not present

## 2021-03-23 DIAGNOSIS — L97812 Non-pressure chronic ulcer of other part of right lower leg with fat layer exposed: Secondary | ICD-10-CM | POA: Diagnosis not present

## 2021-03-24 DIAGNOSIS — I4891 Unspecified atrial fibrillation: Secondary | ICD-10-CM | POA: Diagnosis not present

## 2021-03-24 DIAGNOSIS — I5032 Chronic diastolic (congestive) heart failure: Secondary | ICD-10-CM | POA: Diagnosis not present

## 2021-03-24 DIAGNOSIS — E1151 Type 2 diabetes mellitus with diabetic peripheral angiopathy without gangrene: Secondary | ICD-10-CM | POA: Diagnosis not present

## 2021-03-24 DIAGNOSIS — E1142 Type 2 diabetes mellitus with diabetic polyneuropathy: Secondary | ICD-10-CM | POA: Diagnosis not present

## 2021-03-24 DIAGNOSIS — I2510 Atherosclerotic heart disease of native coronary artery without angina pectoris: Secondary | ICD-10-CM | POA: Diagnosis not present

## 2021-03-24 DIAGNOSIS — I110 Hypertensive heart disease with heart failure: Secondary | ICD-10-CM | POA: Diagnosis not present

## 2021-03-24 DIAGNOSIS — I69354 Hemiplegia and hemiparesis following cerebral infarction affecting left non-dominant side: Secondary | ICD-10-CM | POA: Diagnosis not present

## 2021-03-24 DIAGNOSIS — L97812 Non-pressure chronic ulcer of other part of right lower leg with fat layer exposed: Secondary | ICD-10-CM | POA: Diagnosis not present

## 2021-03-24 DIAGNOSIS — I872 Venous insufficiency (chronic) (peripheral): Secondary | ICD-10-CM | POA: Diagnosis not present

## 2021-03-28 DIAGNOSIS — E1151 Type 2 diabetes mellitus with diabetic peripheral angiopathy without gangrene: Secondary | ICD-10-CM | POA: Diagnosis not present

## 2021-03-28 DIAGNOSIS — I110 Hypertensive heart disease with heart failure: Secondary | ICD-10-CM | POA: Diagnosis not present

## 2021-03-28 DIAGNOSIS — I2510 Atherosclerotic heart disease of native coronary artery without angina pectoris: Secondary | ICD-10-CM | POA: Diagnosis not present

## 2021-03-28 DIAGNOSIS — L97812 Non-pressure chronic ulcer of other part of right lower leg with fat layer exposed: Secondary | ICD-10-CM | POA: Diagnosis not present

## 2021-03-28 DIAGNOSIS — E1142 Type 2 diabetes mellitus with diabetic polyneuropathy: Secondary | ICD-10-CM | POA: Diagnosis not present

## 2021-03-28 DIAGNOSIS — I5032 Chronic diastolic (congestive) heart failure: Secondary | ICD-10-CM | POA: Diagnosis not present

## 2021-03-28 DIAGNOSIS — I4891 Unspecified atrial fibrillation: Secondary | ICD-10-CM | POA: Diagnosis not present

## 2021-03-28 DIAGNOSIS — I872 Venous insufficiency (chronic) (peripheral): Secondary | ICD-10-CM | POA: Diagnosis not present

## 2021-03-28 DIAGNOSIS — I69354 Hemiplegia and hemiparesis following cerebral infarction affecting left non-dominant side: Secondary | ICD-10-CM | POA: Diagnosis not present

## 2021-04-05 DIAGNOSIS — L97812 Non-pressure chronic ulcer of other part of right lower leg with fat layer exposed: Secondary | ICD-10-CM | POA: Diagnosis not present

## 2021-04-05 DIAGNOSIS — I69354 Hemiplegia and hemiparesis following cerebral infarction affecting left non-dominant side: Secondary | ICD-10-CM | POA: Diagnosis not present

## 2021-04-05 DIAGNOSIS — I2510 Atherosclerotic heart disease of native coronary artery without angina pectoris: Secondary | ICD-10-CM | POA: Diagnosis not present

## 2021-04-05 DIAGNOSIS — I110 Hypertensive heart disease with heart failure: Secondary | ICD-10-CM | POA: Diagnosis not present

## 2021-04-05 DIAGNOSIS — I5032 Chronic diastolic (congestive) heart failure: Secondary | ICD-10-CM | POA: Diagnosis not present

## 2021-04-05 DIAGNOSIS — I4891 Unspecified atrial fibrillation: Secondary | ICD-10-CM | POA: Diagnosis not present

## 2021-04-05 DIAGNOSIS — I872 Venous insufficiency (chronic) (peripheral): Secondary | ICD-10-CM | POA: Diagnosis not present

## 2021-04-05 DIAGNOSIS — E1151 Type 2 diabetes mellitus with diabetic peripheral angiopathy without gangrene: Secondary | ICD-10-CM | POA: Diagnosis not present

## 2021-04-05 DIAGNOSIS — E1142 Type 2 diabetes mellitus with diabetic polyneuropathy: Secondary | ICD-10-CM | POA: Diagnosis not present

## 2021-04-11 DIAGNOSIS — I5032 Chronic diastolic (congestive) heart failure: Secondary | ICD-10-CM | POA: Diagnosis not present

## 2021-04-11 DIAGNOSIS — I2510 Atherosclerotic heart disease of native coronary artery without angina pectoris: Secondary | ICD-10-CM | POA: Diagnosis not present

## 2021-04-11 DIAGNOSIS — E1151 Type 2 diabetes mellitus with diabetic peripheral angiopathy without gangrene: Secondary | ICD-10-CM | POA: Diagnosis not present

## 2021-04-11 DIAGNOSIS — E1142 Type 2 diabetes mellitus with diabetic polyneuropathy: Secondary | ICD-10-CM | POA: Diagnosis not present

## 2021-04-11 DIAGNOSIS — I69354 Hemiplegia and hemiparesis following cerebral infarction affecting left non-dominant side: Secondary | ICD-10-CM | POA: Diagnosis not present

## 2021-04-11 DIAGNOSIS — I4891 Unspecified atrial fibrillation: Secondary | ICD-10-CM | POA: Diagnosis not present

## 2021-04-11 DIAGNOSIS — I872 Venous insufficiency (chronic) (peripheral): Secondary | ICD-10-CM | POA: Diagnosis not present

## 2021-04-11 DIAGNOSIS — L97812 Non-pressure chronic ulcer of other part of right lower leg with fat layer exposed: Secondary | ICD-10-CM | POA: Diagnosis not present

## 2021-04-11 DIAGNOSIS — I110 Hypertensive heart disease with heart failure: Secondary | ICD-10-CM | POA: Diagnosis not present

## 2021-04-12 DIAGNOSIS — E1142 Type 2 diabetes mellitus with diabetic polyneuropathy: Secondary | ICD-10-CM | POA: Diagnosis not present

## 2021-04-12 DIAGNOSIS — L97812 Non-pressure chronic ulcer of other part of right lower leg with fat layer exposed: Secondary | ICD-10-CM | POA: Diagnosis not present

## 2021-04-12 DIAGNOSIS — I110 Hypertensive heart disease with heart failure: Secondary | ICD-10-CM | POA: Diagnosis not present

## 2021-04-12 DIAGNOSIS — I69354 Hemiplegia and hemiparesis following cerebral infarction affecting left non-dominant side: Secondary | ICD-10-CM | POA: Diagnosis not present

## 2021-04-12 DIAGNOSIS — I2510 Atherosclerotic heart disease of native coronary artery without angina pectoris: Secondary | ICD-10-CM | POA: Diagnosis not present

## 2021-04-12 DIAGNOSIS — E1151 Type 2 diabetes mellitus with diabetic peripheral angiopathy without gangrene: Secondary | ICD-10-CM | POA: Diagnosis not present

## 2021-04-12 DIAGNOSIS — I872 Venous insufficiency (chronic) (peripheral): Secondary | ICD-10-CM | POA: Diagnosis not present

## 2021-04-12 DIAGNOSIS — I4891 Unspecified atrial fibrillation: Secondary | ICD-10-CM | POA: Diagnosis not present

## 2021-04-12 DIAGNOSIS — I5032 Chronic diastolic (congestive) heart failure: Secondary | ICD-10-CM | POA: Diagnosis not present

## 2021-04-16 ENCOUNTER — Inpatient Hospital Stay
Admission: EM | Admit: 2021-04-16 | Discharge: 2021-05-13 | DRG: 813 | Disposition: E | Payer: Medicare HMO | Attending: Pulmonary Disease | Admitting: Pulmonary Disease

## 2021-04-16 ENCOUNTER — Other Ambulatory Visit: Payer: Self-pay

## 2021-04-16 DIAGNOSIS — E876 Hypokalemia: Secondary | ICD-10-CM | POA: Diagnosis present

## 2021-04-16 DIAGNOSIS — R4189 Other symptoms and signs involving cognitive functions and awareness: Secondary | ICD-10-CM | POA: Diagnosis not present

## 2021-04-16 DIAGNOSIS — K625 Hemorrhage of anus and rectum: Secondary | ICD-10-CM | POA: Diagnosis present

## 2021-04-16 DIAGNOSIS — I5033 Acute on chronic diastolic (congestive) heart failure: Secondary | ICD-10-CM | POA: Diagnosis not present

## 2021-04-16 DIAGNOSIS — F10239 Alcohol dependence with withdrawal, unspecified: Secondary | ICD-10-CM | POA: Diagnosis not present

## 2021-04-16 DIAGNOSIS — I6930 Unspecified sequelae of cerebral infarction: Secondary | ICD-10-CM

## 2021-04-16 DIAGNOSIS — R9431 Abnormal electrocardiogram [ECG] [EKG]: Secondary | ICD-10-CM | POA: Diagnosis not present

## 2021-04-16 DIAGNOSIS — Z9012 Acquired absence of left breast and nipple: Secondary | ICD-10-CM

## 2021-04-16 DIAGNOSIS — Z853 Personal history of malignant neoplasm of breast: Secondary | ICD-10-CM

## 2021-04-16 DIAGNOSIS — F17210 Nicotine dependence, cigarettes, uncomplicated: Secondary | ICD-10-CM | POA: Diagnosis present

## 2021-04-16 DIAGNOSIS — R6521 Severe sepsis with septic shock: Secondary | ICD-10-CM | POA: Diagnosis not present

## 2021-04-16 DIAGNOSIS — F1020 Alcohol dependence, uncomplicated: Secondary | ICD-10-CM | POA: Diagnosis present

## 2021-04-16 DIAGNOSIS — R918 Other nonspecific abnormal finding of lung field: Secondary | ICD-10-CM | POA: Diagnosis not present

## 2021-04-16 DIAGNOSIS — I509 Heart failure, unspecified: Secondary | ICD-10-CM | POA: Diagnosis not present

## 2021-04-16 DIAGNOSIS — L8990 Pressure ulcer of unspecified site, unspecified stage: Secondary | ICD-10-CM | POA: Insufficient documentation

## 2021-04-16 DIAGNOSIS — Z66 Do not resuscitate: Secondary | ICD-10-CM | POA: Diagnosis not present

## 2021-04-16 DIAGNOSIS — Z515 Encounter for palliative care: Secondary | ICD-10-CM | POA: Diagnosis not present

## 2021-04-16 DIAGNOSIS — I110 Hypertensive heart disease with heart failure: Secondary | ICD-10-CM | POA: Diagnosis present

## 2021-04-16 DIAGNOSIS — A419 Sepsis, unspecified organism: Secondary | ICD-10-CM | POA: Diagnosis not present

## 2021-04-16 DIAGNOSIS — G929 Unspecified toxic encephalopathy: Secondary | ICD-10-CM | POA: Diagnosis not present

## 2021-04-16 DIAGNOSIS — E7211 Homocystinuria: Secondary | ICD-10-CM | POA: Diagnosis present

## 2021-04-16 DIAGNOSIS — Z7901 Long term (current) use of anticoagulants: Secondary | ICD-10-CM | POA: Diagnosis not present

## 2021-04-16 DIAGNOSIS — Z978 Presence of other specified devices: Secondary | ICD-10-CM

## 2021-04-16 DIAGNOSIS — J80 Acute respiratory distress syndrome: Secondary | ICD-10-CM | POA: Diagnosis not present

## 2021-04-16 DIAGNOSIS — K219 Gastro-esophageal reflux disease without esophagitis: Secondary | ICD-10-CM | POA: Diagnosis present

## 2021-04-16 DIAGNOSIS — F32A Depression, unspecified: Secondary | ICD-10-CM | POA: Diagnosis present

## 2021-04-16 DIAGNOSIS — R4182 Altered mental status, unspecified: Secondary | ICD-10-CM | POA: Diagnosis not present

## 2021-04-16 DIAGNOSIS — F10231 Alcohol dependence with withdrawal delirium: Secondary | ICD-10-CM | POA: Diagnosis not present

## 2021-04-16 DIAGNOSIS — Z90710 Acquired absence of both cervix and uterus: Secondary | ICD-10-CM

## 2021-04-16 DIAGNOSIS — D689 Coagulation defect, unspecified: Secondary | ICD-10-CM | POA: Diagnosis not present

## 2021-04-16 DIAGNOSIS — F419 Anxiety disorder, unspecified: Secondary | ICD-10-CM | POA: Diagnosis present

## 2021-04-16 DIAGNOSIS — Z452 Encounter for adjustment and management of vascular access device: Secondary | ICD-10-CM | POA: Diagnosis not present

## 2021-04-16 DIAGNOSIS — E871 Hypo-osmolality and hyponatremia: Secondary | ICD-10-CM | POA: Diagnosis not present

## 2021-04-16 DIAGNOSIS — M545 Low back pain, unspecified: Secondary | ICD-10-CM | POA: Diagnosis present

## 2021-04-16 DIAGNOSIS — Z803 Family history of malignant neoplasm of breast: Secondary | ICD-10-CM

## 2021-04-16 DIAGNOSIS — R791 Abnormal coagulation profile: Secondary | ICD-10-CM | POA: Diagnosis not present

## 2021-04-16 DIAGNOSIS — F1010 Alcohol abuse, uncomplicated: Secondary | ICD-10-CM

## 2021-04-16 DIAGNOSIS — J9601 Acute respiratory failure with hypoxia: Secondary | ICD-10-CM | POA: Diagnosis not present

## 2021-04-16 DIAGNOSIS — Z4659 Encounter for fitting and adjustment of other gastrointestinal appliance and device: Secondary | ICD-10-CM

## 2021-04-16 DIAGNOSIS — R609 Edema, unspecified: Secondary | ICD-10-CM | POA: Diagnosis not present

## 2021-04-16 DIAGNOSIS — I10 Essential (primary) hypertension: Secondary | ICD-10-CM | POA: Diagnosis present

## 2021-04-16 DIAGNOSIS — F39 Unspecified mood [affective] disorder: Secondary | ICD-10-CM | POA: Diagnosis present

## 2021-04-16 DIAGNOSIS — R0902 Hypoxemia: Secondary | ICD-10-CM

## 2021-04-16 DIAGNOSIS — J189 Pneumonia, unspecified organism: Secondary | ICD-10-CM | POA: Diagnosis not present

## 2021-04-16 DIAGNOSIS — Z20822 Contact with and (suspected) exposure to covid-19: Secondary | ICD-10-CM | POA: Diagnosis present

## 2021-04-16 DIAGNOSIS — F1910 Other psychoactive substance abuse, uncomplicated: Secondary | ICD-10-CM | POA: Diagnosis present

## 2021-04-16 DIAGNOSIS — R58 Hemorrhage, not elsewhere classified: Secondary | ICD-10-CM | POA: Diagnosis present

## 2021-04-16 DIAGNOSIS — R7989 Other specified abnormal findings of blood chemistry: Secondary | ICD-10-CM | POA: Diagnosis not present

## 2021-04-16 DIAGNOSIS — Z8041 Family history of malignant neoplasm of ovary: Secondary | ICD-10-CM

## 2021-04-16 DIAGNOSIS — Z96641 Presence of right artificial hip joint: Secondary | ICD-10-CM | POA: Diagnosis present

## 2021-04-16 DIAGNOSIS — I959 Hypotension, unspecified: Secondary | ICD-10-CM | POA: Diagnosis not present

## 2021-04-16 DIAGNOSIS — J90 Pleural effusion, not elsewhere classified: Secondary | ICD-10-CM | POA: Diagnosis not present

## 2021-04-16 DIAGNOSIS — E785 Hyperlipidemia, unspecified: Secondary | ICD-10-CM | POA: Diagnosis present

## 2021-04-16 DIAGNOSIS — I517 Cardiomegaly: Secondary | ICD-10-CM | POA: Diagnosis not present

## 2021-04-16 DIAGNOSIS — J9690 Respiratory failure, unspecified, unspecified whether with hypoxia or hypercapnia: Secondary | ICD-10-CM | POA: Diagnosis not present

## 2021-04-16 DIAGNOSIS — Z4682 Encounter for fitting and adjustment of non-vascular catheter: Secondary | ICD-10-CM | POA: Diagnosis not present

## 2021-04-16 DIAGNOSIS — I4891 Unspecified atrial fibrillation: Secondary | ICD-10-CM | POA: Diagnosis not present

## 2021-04-16 DIAGNOSIS — Z823 Family history of stroke: Secondary | ICD-10-CM

## 2021-04-16 DIAGNOSIS — T45511A Poisoning by anticoagulants, accidental (unintentional), initial encounter: Secondary | ICD-10-CM

## 2021-04-16 DIAGNOSIS — R000 Tachycardia, unspecified: Secondary | ICD-10-CM | POA: Diagnosis not present

## 2021-04-16 DIAGNOSIS — E861 Hypovolemia: Secondary | ICD-10-CM | POA: Diagnosis present

## 2021-04-16 LAB — COMPREHENSIVE METABOLIC PANEL
ALT: 20 U/L (ref 0–44)
AST: 33 U/L (ref 15–41)
Albumin: 2 g/dL — ABNORMAL LOW (ref 3.5–5.0)
Alkaline Phosphatase: 139 U/L — ABNORMAL HIGH (ref 38–126)
Anion gap: 13 (ref 5–15)
BUN: <5 mg/dL — ABNORMAL LOW (ref 6–20)
CO2: 34 mmol/L — ABNORMAL HIGH (ref 22–32)
Calcium: 7.7 mg/dL — ABNORMAL LOW (ref 8.9–10.3)
Chloride: 81 mmol/L — ABNORMAL LOW (ref 98–111)
Creatinine, Ser: 0.63 mg/dL (ref 0.44–1.00)
GFR, Estimated: >60 mL/min (ref >60)
Glucose, Bld: 100 mg/dL — ABNORMAL HIGH (ref 70–99)
Potassium: 2.4 mmol/L — CL (ref 3.5–5.1)
Sodium: 128 mmol/L — ABNORMAL LOW (ref 135–145)
Total Bilirubin: 0.8 mg/dL (ref 0.3–1.2)
Total Protein: 6.3 g/dL — ABNORMAL LOW (ref 6.5–8.1)

## 2021-04-16 LAB — TYPE AND SCREEN
ABO/RH(D): AB POS
Antibody Screen: NEGATIVE

## 2021-04-16 LAB — CBC
HCT: 32.2 % — ABNORMAL LOW (ref 36.0–46.0)
Hemoglobin: 11.4 g/dL — ABNORMAL LOW (ref 12.0–15.0)
MCH: 32.9 pg (ref 26.0–34.0)
MCHC: 35.4 g/dL (ref 30.0–36.0)
MCV: 92.8 fL (ref 80.0–100.0)
Platelets: 296 10*3/uL (ref 150–400)
RBC: 3.47 MIL/uL — ABNORMAL LOW (ref 3.87–5.11)
RDW: 14.6 % (ref 11.5–15.5)
WBC: 9.2 10*3/uL (ref 4.0–10.5)
nRBC: 0 % (ref 0.0–0.2)

## 2021-04-16 LAB — MAGNESIUM: Magnesium: 1.8 mg/dL (ref 1.7–2.4)

## 2021-04-16 LAB — RESP PANEL BY RT-PCR (FLU A&B, COVID) ARPGX2
Influenza A by PCR: NEGATIVE
Influenza B by PCR: NEGATIVE
SARS Coronavirus 2 by RT PCR: NEGATIVE

## 2021-04-16 LAB — PROTIME-INR
INR: 10.4 (ref 0.8–1.2)
Prothrombin Time: 81.9 seconds — ABNORMAL HIGH (ref 11.4–15.2)

## 2021-04-16 MED ORDER — HYDRALAZINE HCL 20 MG/ML IJ SOLN: 5.0000 mg | INTRAMUSCULAR | Status: DC | PRN

## 2021-04-16 MED ORDER — NICOTINE 7 MG/24HR TD PT24
7.0000 mg | MEDICATED_PATCH | Freq: Every day | TRANSDERMAL | Status: DC | PRN
  Filled 2021-04-16: qty 1

## 2021-04-16 MED ORDER — BACLOFEN 10 MG PO TABS
10.0000 mg | ORAL_TABLET | Freq: Three times a day (TID) | ORAL | Status: DC | PRN
  Filled 2021-04-16: qty 1

## 2021-04-16 MED ORDER — SODIUM CHLORIDE 0.9% FLUSH
3.0000 mL | Freq: Two times a day (BID) | INTRAVENOUS | Status: DC
  Administered 2021-04-16 – 2021-04-17 (×4): 3 mL via INTRAVENOUS
  Administered 2021-04-18: 10 mL via INTRAVENOUS
  Administered 2021-04-18 – 2021-04-22 (×8): 3 mL via INTRAVENOUS

## 2021-04-16 MED ORDER — MAGNESIUM SULFATE 2 GM/50ML IV SOLN
2.0000 g | Freq: Once | INTRAVENOUS | Status: AC
  Administered 2021-04-16: 2 g via INTRAVENOUS
  Filled 2021-04-16: qty 50

## 2021-04-16 MED ORDER — ONDANSETRON HCL 4 MG PO TABS: 4.0000 mg | ORAL_TABLET | Freq: Four times a day (QID) | ORAL | Status: DC | PRN

## 2021-04-16 MED ORDER — LACTATED RINGERS IV BOLUS
1000.0000 mL | Freq: Once | INTRAVENOUS | Status: AC
  Administered 2021-04-16: 1000 mL via INTRAVENOUS

## 2021-04-16 MED ORDER — METOPROLOL SUCCINATE ER 25 MG PO TB24
25.0000 mg | ORAL_TABLET | Freq: Every day | ORAL | Status: DC
  Administered 2021-04-16: 25 mg via ORAL
  Filled 2021-04-16: qty 1

## 2021-04-16 MED ORDER — PHYTONADIONE 5 MG PO TABS
2.5000 mg | ORAL_TABLET | Freq: Once | ORAL | Status: AC
  Administered 2021-04-16: 2.5 mg via ORAL
  Filled 2021-04-16: qty 1

## 2021-04-16 MED ORDER — ACETAMINOPHEN 325 MG PO TABS: 650.0000 mg | ORAL_TABLET | Freq: Four times a day (QID) | ORAL | Status: DC | PRN

## 2021-04-16 MED ORDER — ATORVASTATIN CALCIUM 20 MG PO TABS
40.0000 mg | ORAL_TABLET | Freq: Every day | ORAL | Status: DC
  Administered 2021-04-16 – 2021-04-18 (×3): 40 mg via ORAL
  Filled 2021-04-16 (×3): qty 2

## 2021-04-16 MED ORDER — FOLIC ACID 1 MG PO TABS
1.0000 mg | ORAL_TABLET | Freq: Every day | ORAL | Status: DC
  Administered 2021-04-16 – 2021-04-18 (×3): 1 mg via ORAL
  Filled 2021-04-16 (×4): qty 1

## 2021-04-16 MED ORDER — GABAPENTIN 600 MG PO TABS
600.0000 mg | ORAL_TABLET | Freq: Four times a day (QID) | ORAL | Status: DC
  Administered 2021-04-16 – 2021-04-19 (×11): 600 mg via ORAL
  Filled 2021-04-16 (×14): qty 1

## 2021-04-16 MED ORDER — LORAZEPAM 2 MG/ML IJ SOLN
0.0000 mg | Freq: Two times a day (BID) | INTRAMUSCULAR | Status: AC
  Filled 2021-04-16: qty 1

## 2021-04-16 MED ORDER — ONDANSETRON HCL 4 MG/2ML IJ SOLN: 4.0000 mg | Freq: Four times a day (QID) | INTRAMUSCULAR | Status: DC | PRN

## 2021-04-16 MED ORDER — BISACODYL 5 MG PO TBEC: 5.0000 mg | DELAYED_RELEASE_TABLET | Freq: Every day | ORAL | Status: DC | PRN

## 2021-04-16 MED ORDER — HYDROCODONE-ACETAMINOPHEN 5-325 MG PO TABS
1.0000 | ORAL_TABLET | ORAL | Status: DC | PRN
  Administered 2021-04-16 – 2021-04-17 (×3): 2 via ORAL
  Filled 2021-04-16 (×3): qty 2

## 2021-04-16 MED ORDER — THIAMINE HCL 100 MG PO TABS
100.0000 mg | ORAL_TABLET | Freq: Every day | ORAL | Status: DC
  Administered 2021-04-17 – 2021-04-18 (×2): 100 mg via ORAL
  Filled 2021-04-16 (×3): qty 1

## 2021-04-16 MED ORDER — POTASSIUM CHLORIDE 10 MEQ/100ML IV SOLN
10.0000 meq | INTRAVENOUS | Status: AC
  Administered 2021-04-16 (×2): 10 meq via INTRAVENOUS
  Filled 2021-04-16 (×2): qty 100

## 2021-04-16 MED ORDER — ACETAMINOPHEN 650 MG RE SUPP: 650.0000 mg | Freq: Four times a day (QID) | RECTAL | Status: DC | PRN

## 2021-04-16 MED ORDER — MORPHINE SULFATE (PF) 2 MG/ML IV SOLN
2.0000 mg | INTRAVENOUS | Status: DC | PRN
  Administered 2021-04-16: 2 mg via INTRAVENOUS
  Filled 2021-04-16: qty 1

## 2021-04-16 MED ORDER — DULOXETINE HCL 30 MG PO CPEP
60.0000 mg | ORAL_CAPSULE | Freq: Every day | ORAL | Status: DC
  Administered 2021-04-17 – 2021-04-18 (×2): 60 mg via ORAL
  Filled 2021-04-16 (×6): qty 2

## 2021-04-16 MED ORDER — POLYETHYLENE GLYCOL 3350 17 G PO PACK: 17.0000 g | PACK | Freq: Every day | ORAL | Status: DC | PRN

## 2021-04-16 MED ORDER — LORAZEPAM 1 MG PO TABS: 1.0000 mg | ORAL_TABLET | ORAL | Status: AC | PRN

## 2021-04-16 MED ORDER — DOCUSATE SODIUM 100 MG PO CAPS
100.0000 mg | ORAL_CAPSULE | Freq: Two times a day (BID) | ORAL | Status: DC
  Administered 2021-04-16 – 2021-04-18 (×6): 100 mg via ORAL
  Filled 2021-04-16 (×6): qty 1

## 2021-04-16 MED ORDER — BUPROPION HCL ER (XL) 150 MG PO TB24
150.0000 mg | ORAL_TABLET | Freq: Every day | ORAL | Status: DC
  Administered 2021-04-17 – 2021-04-18 (×2): 150 mg via ORAL
  Filled 2021-04-16 (×6): qty 1

## 2021-04-16 MED ORDER — LACTATED RINGERS IV SOLN: INTRAVENOUS | Status: DC

## 2021-04-16 MED ORDER — THIAMINE HCL 100 MG/ML IJ SOLN
100.0000 mg | Freq: Every day | INTRAMUSCULAR | Status: DC
  Administered 2021-04-16: 100 mg via INTRAVENOUS
  Filled 2021-04-16: qty 2

## 2021-04-16 MED ORDER — ADULT MULTIVITAMIN W/MINERALS CH
1.0000 | ORAL_TABLET | Freq: Every day | ORAL | Status: DC
  Administered 2021-04-16 – 2021-04-18 (×3): 1 via ORAL
  Filled 2021-04-16 (×4): qty 1

## 2021-04-16 MED ORDER — LORAZEPAM 2 MG/ML IJ SOLN
0.0000 mg | Freq: Four times a day (QID) | INTRAMUSCULAR | Status: AC
  Administered 2021-04-18: 2 mg via INTRAVENOUS
  Filled 2021-04-16: qty 1

## 2021-04-16 MED ORDER — LORAZEPAM 2 MG/ML IJ SOLN
1.0000 mg | INTRAMUSCULAR | Status: AC | PRN
  Administered 2021-04-18 – 2021-04-19 (×4): 1 mg via INTRAVENOUS
  Filled 2021-04-16 (×3): qty 1

## 2021-04-16 MED ORDER — POTASSIUM CHLORIDE CRYS ER 20 MEQ PO TBCR
40.0000 meq | EXTENDED_RELEASE_TABLET | Freq: Once | ORAL | Status: AC
  Administered 2021-04-16: 40 meq via ORAL
  Filled 2021-04-16: qty 2

## 2021-04-16 NOTE — ED Provider Notes (Signed)
[facility] Emergency Department Provider Note   ____________________________________________   Event Date/Time   First MD Initiated Contact with Patient 04/27/2021 1237     (approximate)  I have reviewed the triage vital signs and the nursing notes.   HISTORY  Chief Complaint Rectal Bleeding and Back Pain    HPI Tina Sharp is a 55 y.o. 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  Patient reports that she is typically able to ambulate with the assistance of a walker, but has been feeling weaker than usual ever since waking up this morning.  She feels weak in both legs and has been unable to walk, denies any weakness in her arms.  She has also noticed some bleeding from her backside, denies vaginal bleeding and feels this is coming from the area of her bottom.  She is not sure if she has had any bloody stools or dark tarry stools.  She denies any abdominal pain, nausea, vomiting.  She does admit to regular alcohol consumption, at least 1 bottle of wine per day.  She states her last drink was earlier today.        Past Medical History:  Diagnosis Date   Anxiety    Breast cancer ([place]) 01/2014   Invasive lobular carcinoma, 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 ER/ PR+; Her 2 neu not overexpressed, microscopic positive margin (skin). Oncotype DX, low risk for recurrence.   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    Patient Active Problem List   Diagnosis Date Noted   Supratherapeutic INR 04/15/2021   Alcohol dependence syndrome ([place]) 04/15/2021   History of cryptogenic cerebrovascular accident (CVA) with residual deficit 10/05/2019    Hyponatremia [id number]   Alcoholic liver disease ([place]) 10/05/2019   Chronic anticoagulation 10/05/2019   History of breast cancer 10/05/2019   Alcohol withdrawal delirium, acute, mixed level of activity ([place]) 10/05/2019   Alcohol withdrawal syndrome, uncomplicated ([place]) [id number]   Fall 09/12/2018   Lymphedema 04/30/2018   Hypertension 01/14/2018   Leg swelling 01/14/2018   Breast calcifications on mammogram 06/24/2017   Hip fracture ([place]) 07/30/2016   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/15/2016   Rotator cuff tendinitis, left 06/15/2016   Neuropathy 01/03/2016   Ataxia 01/03/2016   Myalgia 09/08/2015   Depression 09/06/2015   Cerebrovascular accident (CVA) due to thrombosis of posterior cerebral artery ([place]) 08/18/2015   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   Stroke with cerebral ischemia ([place]) 07/12/2015   HLD (hyperlipidemia) 07/12/2015   CVA (cerebral infarction) 06/21/2015   TIA (transient ischemic attack) 06/21/2015   Macrocytic anemia 06/20/2015   Headache 06/14/2015   Cigarette smoker 06/14/2015   Hypokalemia 06/12/2015   Stroke (cerebrum) ([place]) cryptogenic R brain infarcts s/p IV tPA  06/11/2015   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, estrogen receptor positive ([place]) 04/23/2014    Past Surgical History:  Procedure Laterality Date   ABDOMINAL HYSTERECTOMY  2000   left both ovaries in   BREAST SURGERY Left 04/08/14   mastectomy   COLONOSCOPY  06/30/2008   Lucilla Lame, MD; chronic diarrhea. Negative ileal and colon biopsies.    EP IMPLANTABLE DEVICE N/A 06/14/2015   Procedure: Loop Recorder Insertion;  Surgeon:  Evans Lance, MD;  Location: [facility];  Service: Cardiovascular;  Laterality: N/A;   HIP ARTHROPLASTY Right 08/01/2016   Procedure: ARTHROPLASTY BIPOLAR HIP (HEMIARTHROPLASTY);  Surgeon: Dereck Leep, MD;  Location: [facility];  Service: Orthopedics;  Laterality: Right;   KYPHOPLASTY N/A 09/16/2018   Procedure: KYPHOPLASTY T12;   Surgeon: Hessie Knows, MD;  Location: [facility];  Service: Orthopedics;  Laterality: N/A;   LAPAROSCOPIC HYSTERECTOMY  2010   [facility]   MASTECTOMY Left 2016   REDUCTION MAMMAPLASTY Right 2016   Lift   reduction mammoplasty Right 04/08/14   Dr Tula Nakayama   TEE WITHOUT CARDIOVERSION N/A 06/14/2015   Procedure: TRANSESOPHAGEAL ECHOCARDIOGRAM (TEE);  Surgeon: Josue Hector, MD;  Location: [facility] ENDOSCOPY;  Service: Cardiovascular;  Laterality: N/A;   TONSILLECTOMY AND ADENOIDECTOMY     pt was 55 years old    Prior to Admission medications   Medication Sig Start Date End Date Taking? Authorizing Provider  atorvastatin (LIPITOR) 40 MG tablet Take 1 tablet (40 mg total) by mouth daily at 6 PM. 11/09/15   Gladstone Lighter, MD  baclofen (LIORESAL) 10 MG tablet Take 10 mg by mouth 3 (three) times daily as needed for muscle spasms.     [provider]  buPROPion (WELLBUTRIN XL) 150 MG 24 hr tablet Take 150 mg by mouth daily. 09/03/19   [provider]  clonazePAM (KLONOPIN) 0.5 MG tablet Take 1 tablet (0.5 mg total) by mouth 2 (two) times daily as needed for anxiety. 09/17/18   Ripley Fraise, PA  cyanocobalamin 1000 MCG tablet Take 1 tablet (1,000 mcg total) by mouth daily. 11/09/15   Gladstone Lighter, MD  DULoxetine (CYMBALTA) 60 MG capsule Take 60 mg by mouth daily. 07/21/16   [provider]  gabapentin (NEURONTIN) 600 MG tablet Take 600 mg by mouth 4 (four) times daily.    [provider]  metoprolol succinate (TOPROL-XL) 25 MG 24 hr tablet Take 25 mg by mouth daily. 08/31/19   [provider]  omeprazole (PRILOSEC) 20 MG capsule Take 20 mg by mouth as needed (heart burn).     [provider]  potassium chloride (K-DUR) 10 MEQ tablet Take 10 mEq by mouth daily.    [provider]  warfarin (COUMADIN) 1 MG tablet Take 2 mg by mouth every evening.     [provider]    Allergies Patient has no known allergies.  Family History   Problem Relation Age of Onset   Breast cancer Mother 56   Ovarian cancer Other    Stroke Paternal Grandmother    Breast cancer Maternal Grandmother 80   Cancer Maternal Grandfather 70       prostate   Prostate cancer Maternal Grandfather     Social History Social History   Tobacco Use   Smoking status: Every Day    Packs/day: 0.25    Types: Cigarettes   Smokeless tobacco: Never   Tobacco comments:    E cig  Vaping Use   Vaping Use: Never used  Substance Use Topics   Alcohol use: Not Currently    Comment: 1-2 bottles per day   Drug use: No    Review of Systems  Constitutional: No fever/chills.  Positive for generalized weakness. Eyes: No visual changes. ENT: No sore throat. Cardiovascular: Denies chest pain. Respiratory: Denies shortness of breath. Gastrointestinal: No abdominal pain.  No nausea, no vomiting.  No diarrhea.  No constipation.  Positive for rectal bleeding. Genitourinary: Negative for dysuria.  Musculoskeletal: Negative for back pain. Skin: Negative for rash. Neurological: Negative for headaches, focal weakness or numbness.  ____________________________________________   PHYSICAL EXAM:  VITAL SIGNS: ED Triage Vitals [05/09/2021 1205]  Enc Vitals Group     BP (!) 98/44     Pulse Rate (!) 114     Resp 18     Temp 98.9 F (37.2 C)     Temp Source Oral     SpO2 97 %     Weight 163 lb (73.9 kg)     Height '5\' 2"'$  (1.575 m)     Head Circumference      Peak Flow      Pain Score 9     Pain Loc      Pain Edu?      Excl. in [place]?     Constitutional: Alert and oriented. Eyes: Conjunctivae are normal. Head: Atraumatic. Nose: No congestion/rhinnorhea. Mouth/Throat: Mucous membranes are moist. Neck: Normal ROM Cardiovascular: Normal rate, regular rhythm. Grossly normal heart sounds.  2+ DP pulses bilaterally. Respiratory: Normal respiratory effort.  No retractions. Lungs CTAB. Gastrointestinal: Soft and nontender. No distention.  Small area of skin  tear versus pressure injury near sacrum with small amount of associated bleeding. Genitourinary: deferred Musculoskeletal: 1+ pitting edema and erythema noted to bilateral lower extremities with no warmth, tenderness, or fluctuance. Neurologic:  Normal speech and language. No gross focal neurologic deficits are appreciated. Skin:  Skin is warm, dry and intact. No rash noted. Psychiatric: Mood and affect are normal. Speech and behavior are normal.  ____________________________________________   LABS (all labs ordered are listed, but only abnormal results are displayed)  Labs Reviewed  COMPREHENSIVE METABOLIC PANEL - Abnormal; Notable for the following components:      Result Value   Sodium 128 (*)    Potassium 2.4 (*)    Chloride 81 (*)    CO2 34 (*)    Glucose, Bld 100 (*)    BUN <5 (*)    Calcium 7.7 (*)    Total Protein 6.3 (*)    Albumin 2.0 (*)    Alkaline Phosphatase 139 (*)    All other components within normal limits  CBC - Abnormal; Notable for the following components:   RBC 3.47 (*)    Hemoglobin 11.4 (*)    HCT 32.2 (*)    All other components within normal limits  PROTIME-INR - Abnormal; Notable for the following components:   Prothrombin Time 81.9 (*)    INR 10.4 (*)    All other components within normal limits  RESP PANEL BY RT-PCR (FLU A&B, COVID) ARPGX2  MAGNESIUM  HIV ANTIBODY (ROUTINE TESTING W REFLEX)  POC OCCULT BLOOD, ED  POC URINE PREG, ED  TYPE AND SCREEN   ____________________________________________  EKG  ED ECG REPORT I, Blake Divine, the attending physician, personally viewed and interpreted this ECG.   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  There is no evidence of acute vaginal bleeding or GI bleeding, patient had a  bowel movement that was light brown with no evidence of blood.  Source of bleeding appears to be from a small skin injury near her sacrum, hemoglobin is relatively stable compared to previous.  She does have an INR of greater than 10, which we will reverse with oral vitamin K given minimal bleeding at this time.  Patient's generalized weakness appears due to significant electrolyte abnormalities with hyponatremia and hypokalemia.  Electrolyte abnormalities likely due to her chronic alcohol abuse, we will hydrate with IV fluids and retied potassium and magnesium supplementation.  No signs of alcohol withdrawal at this time.  Case discussed with hospitalist for admission.      ____________________________________________   FINAL CLINICAL IMPRESSION(S) / ED DIAGNOSES  Final diagnoses:  Hypokalemia  Hyponatremia  Alcohol abuse  Warfarin overdosage, accidental or unintentional, initial encounter     ED Discharge Orders     None        Note:  This document was prepared using Dragon voice recognition software and may include unintentional dictation errors.    Blake Divine, MD 05/09/2021 1537

## 2021-04-16 NOTE — H&P (Signed)
History and Physical    Tina Sharp [id number] DOB: September 04, 1965 DOA: 04/25/2021  PCP: Tina Ruths, MD Consultants:  None Patient coming from:  Home - lives alone, has caregivers on TTSunday; NOK: Daughters, Tina Sharp, [phone] and Tina Sharp  Chief Complaint: Rectal bleeding  HPI: Tina Sharp is a 55 y.o. 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 "God, my feet hurt.  You got to help me.  I don't know how much longer I can be good."  She tried to get up to the bathroom today but couldn't walk with her walker and almost collapsed.  She has not had this issue before.  She was able to walk with her walker yesterday without issues.  She also noticed some bleeding in her vaginal area.  She is having LE pain and this is unusual for her.  It feels "long and hard and then they'll jump and it makes me jump".  Also with lower back pain.  She has had a remote surgery but "it's killing me and I need something for pain."  She has been eating and drinking normally.  She has been drinking wine - she sips about a bottle or two a day every day.  I spoke with her daughter.  "I know that she sometimes eats, she Door Dashes."  She has periods of weakness, falls and needs hospitalization "and it's all because of alcohol."  The last time was there, she was drinking 2-4 bottles of wine a day.  Now she gets delivered to her house via [place].    ED Course: Drinks a bottle of wine daily and takes Coumadin.  Has small skin tear in sacral region.  No apparent GI or GU bleeding.  INR 10.4 - giving oral K+.  Also with electrolyte abnormalities.  Too weak to walk.  Review of Systems: As per HPI; otherwise review of systems reviewed and negative.   Ambulatory Status:  Ambulates with a walker  COVID  Vaccine Status:   Complete  Past Medical History:  Diagnosis Date   Anxiety    Breast cancer ([place]) 01/2014   Invasive lobular carcinoma, 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 ER/ PR+; Her 2 neu not overexpressed, microscopic positive margin (skin). Oncotype DX, low risk for recurrence.   Depression    Genetic screening 05/05/2014   negative /[facility]   GERD (gastroesophageal reflux disease)    Hypertension    Stroke ([place]) 06/11/2015   cerebrum, cryptogenic right brain infarcts s/p IV TPA    Past Surgical History:  Procedure Laterality Date   ABDOMINAL HYSTERECTOMY  2000   left both ovaries in   BREAST SURGERY Left 04/08/14   mastectomy   COLONOSCOPY  06/30/2008   Lucilla Lame, MD; chronic diarrhea. Negative ileal and colon biopsies.    EP IMPLANTABLE DEVICE N/A 06/14/2015   Procedure: Loop Recorder Insertion;  Surgeon: Evans Lance, MD;  Location: [facility];  Service: Cardiovascular;  Laterality: N/A;   HIP ARTHROPLASTY Right 08/01/2016   Procedure: ARTHROPLASTY BIPOLAR HIP (HEMIARTHROPLASTY);  Surgeon: Dereck Leep, MD;  Location: [facility];  Service: Orthopedics;  Laterality: Right;   KYPHOPLASTY N/A 09/16/2018   Procedure: KYPHOPLASTY T12;  Surgeon: Hessie Knows, MD;  Location: [facility];  Service: Orthopedics;  Laterality: N/A;   LAPAROSCOPIC HYSTERECTOMY  2010   [facility]   MASTECTOMY Left 2016  REDUCTION MAMMAPLASTY Right 2016   Lift   reduction mammoplasty Right 04/08/14   Dr Tula Nakayama   TEE WITHOUT CARDIOVERSION N/A 06/14/2015   Procedure: TRANSESOPHAGEAL ECHOCARDIOGRAM (TEE);  Surgeon: Josue Hector, MD;  Location: [facility];  Service: Cardiovascular;  Laterality: N/A;   TONSILLECTOMY AND ADENOIDECTOMY     pt was 55 years old    Social History   Socioeconomic History   Marital status: Divorced    Spouse name: Not on file   Number of children: Not on file   Years of education: Not on file   Highest education level: Not on file  Occupational History    Occupation: disabled  Tobacco Use   Smoking status: Every Day    Packs/day: 0.25    Types: Cigarettes   Smokeless tobacco: Never   Tobacco comments:    E cig  Vaping Use   Vaping Use: Never used  Substance and Sexual Activity   Alcohol use: Not Currently    Comment: 1-2 bottles per day   Drug use: No   Sexual activity: Yes  Other Topics Concern   Not on file  Social History Narrative   Not on file   Social Determinants of Health   Financial Resource Strain: Not on file  Food Insecurity: Not on file  Transportation Needs: Not on file  Physical Activity: Not on file  Stress: Not on file  Social Connections: Not on file  Intimate Partner Violence: Not on file    No Known Allergies  Family History  Problem Relation Age of Onset   Breast cancer Mother 27   Ovarian cancer Other    Stroke Paternal Grandmother    Breast cancer Maternal Grandmother 34   Cancer Maternal Grandfather 9       prostate   Prostate cancer Maternal Grandfather     Prior to Admission medications   Medication Sig Start Date End Date Taking? Authorizing Provider  anastrozole (ARIMIDEX) 1 MG tablet Take 1 tablet (1 mg total) by mouth every evening. Patient not taking: No sig reported 06/15/15   Cammie Sickle, MD  atorvastatin (LIPITOR) 40 MG tablet Take 1 tablet (40 mg total) by mouth daily at 6 PM. 11/09/15   Gladstone Lighter, MD  baclofen (LIORESAL) 10 MG tablet Take 10 mg by mouth 3 (three) times daily as needed for muscle spasms.     [provider]  buPROPion (WELLBUTRIN XL) 150 MG 24 hr tablet Take 150 mg by mouth daily. 09/03/19   [provider]  clonazePAM (KLONOPIN) 0.5 MG tablet Take 1 tablet (0.5 mg total) by mouth 2 (two) times daily as needed for anxiety. 09/17/18   Ripley Fraise, PA  cyanocobalamin 1000 MCG tablet Take 1 tablet (1,000 mcg total) by mouth daily. 11/09/15   Gladstone Lighter, MD  DULoxetine (CYMBALTA) 60 MG capsule Take 60 mg by mouth daily. 07/21/16    [provider]  gabapentin (NEURONTIN) 600 MG tablet Take 600 mg by mouth 4 (four) times daily.    [provider]  metoprolol succinate (TOPROL-XL) 25 MG 24 hr tablet Take 25 mg by mouth daily. 08/31/19   [provider]  omeprazole (PRILOSEC) 20 MG capsule Take 20 mg by mouth as needed (heart burn).     [provider]  potassium chloride (K-DUR) 10 MEQ tablet Take 10 mEq by mouth daily.    [provider]  warfarin (COUMADIN) 1 MG tablet Take 2 mg by mouth every evening.     [provider]    Physical Exam: Vitals:   04/26/2021 1205  BP: (!) 98/44  Pulse: (!) 114  Resp: 18  Temp: 98.9 F (37.2 C)  TempSrc: Oral  SpO2: 97%  Weight: 73.9 kg  Height: '5\' 2"'$  (1.575 m)     General:  Appears calm and comfortable and is in NAD; appears much older than stated age, asked for pain medications at least 4 times during encounter Eyes:  PERRL, EOMI, normal lids, iris ENT:  grossly normal hearing, lips & tongue, mmm Neck:  no LAD, masses or thyromegaly Cardiovascular:  RRR, no m/r/g.  Respiratory:   CTA bilaterally with no wheezes/rales/rhonchi.  Normal respiratory effort. Abdomen:  soft, NT, ND Skin:  severe stasis dermatitis of BLE with diffuse sloughing and weeping along the R lower posterior leg Musculoskeletal:   no bony abnormality Psychiatric:  blunted mood and affect, speech fluent and appropriate, AOx3 Neurologic:  CN 2-12 grossly intact, moves all extremities in coordinated fashion    Radiological Exams on Admission: Independently reviewed - see discussion in A/P where applicable  No results found.  EKG: Independently reviewed.  Sinus tachcyardia with rate 103; prolonged QTc 540; artifact with no obvious evidence of acute ischemia   Labs on Admission: I have personally reviewed the available labs and imaging studies at the time of the admission.  Pertinent labs:   Na++ 128 K+ 2.4 CO2 34 Calcium 7.7 Albumin  2.0 WBC 9.2 Hgb 11.4; 12-13 in 09/2019 INR 10.4   Assessment/Plan Principal Problem:   Supratherapeutic INR Active Problems:   Cigarette smoker   Hypokalemia   Hyperhomocysteinemia (HCC)   HLD (hyperlipidemia)   Hypertension   Hyponatremia   Alcohol dependence syndrome (HCC)   Supratherapeutic INR -Patient with genetic coagulopathy which caused prior CVA -She is on Coumadin but also drinks ETOH heavily -Now here with bleeding from sacral skin tag (per EDP) but otherwise no gross bleeding despite INR 10.4 -Will admit and monitor for bleeding in progressive care unit -Will give vitamin K 2.5 mg PO x 1 -Recheck INR in AM  Hypokalemia -Patient was also found to have severe hypokalemia -This is likely the source of her LE pain and weakness -EDP ordered 80 mEq IV/PO KCl, which would be anticipated to increase K+ to 3.2 -Will monitor in ICU -Recheck BMP in AM -PT/OT consults -Nutrition consult  Hyponatremia -Patient with hyponatremia, as well; she has a h/o this in the past -She appears euvolemic at this time but with her excessive ETOH intake there may be a hypovolemic component -Will give IVF infusion with LR at 44 cc/hr -Recheck in AM  ETOH dependence -Patient with chronic ETOH dependence, drinking 1+ bottles of wine daily -This is quite problematic with her coagulopathy -CIWA protocol -Folate, thiamine, and MVI ordered -Will provide symptom-triggered BZD (ativan per CIWA protocol) only since the patient is able to communicate; is not showing current signs of delirium; and has no history of severe withdrawal. -Consider offering a medication for Alcohol Use Disorder at the time of d/c, to include Disulfuram; Naltrexone; or Acamprosate.  Hyperhomocystinemia -Patient with h/o CVA associated with this condition -On Coumadin to prevent hypercoagulable state -Continue Baclofen  Mood d/o -May be associated with ETOH use -Continue Wellbutrin, Cymbalta, Neurontin -Hold  Klonopin in the setting of CIWA protocol  HTN -Continue Toprol XL  HLD -Continue Lipitor  Tobacco dependence -Encourage cessation.   -This was discussed with the patient and should be reviewed on an ongoing basis.   -Patch ordered  Note: This patient has been tested and is pending for the novel coronavirus COVID-19. The patient has been fully vaccinated against COVID-19.   Level of care: Progressive Cardiac DVT prophylaxis:  SCDs Code Status:  Full - confirmed with patient Family Communication: None present; I spoke with the patient's daughter by telephone at the time of admission. Disposition Plan:  The patient is from: home  Anticipated d/c is to: home, possibly with [facility] services once her cardiology issues have been resolved.  Anticipated d/c date will depend on clinical response to treatment, likely 2-3 days  Patient is currently: acutely ill Consults called: Nutrition, PT/OT, TOC team  Admission status:  Admit - It is my clinical opinion that admission to INPATIENT is reasonable and necessary because of the expectation that this patient will require hospital care that crosses at least 2 midnights to treat this condition based on the medical complexity of the problems presented.  Given the aforementioned information, the predictability of an adverse outcome is felt to be significant.    Karmen Bongo MD [facility]   How to contact the [facility] Attending or Consulting provider [place] or covering provider during after hours [place], for this patient?  Check the care team in [facility] and look for a) attending/consulting TR[facility] provider listed and b) the [facility] team listed Log into [email or web address] and use [place]'s universal password to access. If you do not have the password, please contact the hospital operator. Locate the [facility] provider you are looking for under [facility] and page to a number that you can be directly reached. If you still have difficulty reaching the  provider, please page the [facility] (Director on Call) for the Hospitalists listed on amion for assistance.   04/21/2021, 2:38 PM

## 2021-04-16 NOTE — Plan of Care (Signed)

## 2021-04-16 NOTE — ED Notes (Signed)
Pt presents to ED with bilateral lower extremities dressed, states she has cellulitis and a nurse comes to her home once a week to dress her legs.   Undressed and assessed by Dr Charna Archer.

## 2021-04-16 NOTE — ED Triage Notes (Signed)
Pt arrives to ER c/o of bleeding "not coming out of my vagina, it's coming out of another hole." Pt states bleeding x 2 weeks. Pain c/o of pain in back. Pt states warfarin. Warm blanket placed behind pt back d/t pt c/o of it hurting and being unable to move.

## 2021-04-16 NOTE — ED Triage Notes (Signed)
Pt comes ems from home with vaginal bleeding and leg pain. Pt has known cellulitis in both legs being followed by home health nurse. CBG 128. Pt was hot to touch but temp was 98.6 with EMS. Hx of cysts. Had hysterectomy. No sticks/BP on left side. Aox4. ETOH on board.

## 2021-04-16 NOTE — ED Notes (Signed)
Pt lying in bed with daughter at bedside. Pt calm and cooperative at this time. Pt vitals stable. Pt CIWA score currently at a 1.

## 2021-04-16 NOTE — ED Notes (Signed)
ED provider at bedside, notified of critical lab values including potassium of 2.4 and troponin that were called to me while in pt's room.

## 2021-04-17 DIAGNOSIS — R791 Abnormal coagulation profile: Secondary | ICD-10-CM | POA: Diagnosis not present

## 2021-04-17 LAB — CBC
HCT: 28.7 % — ABNORMAL LOW (ref 36.0–46.0)
Hemoglobin: 10.2 g/dL — ABNORMAL LOW (ref 12.0–15.0)
MCH: 33 pg (ref 26.0–34.0)
MCHC: 35.5 g/dL (ref 30.0–36.0)
MCV: 92.9 fL (ref 80.0–100.0)
Platelets: 248 10*3/uL (ref 150–400)
RBC: 3.09 MIL/uL — ABNORMAL LOW (ref 3.87–5.11)
RDW: 14.7 % (ref 11.5–15.5)
WBC: 7.3 10*3/uL (ref 4.0–10.5)
nRBC: 0 % (ref 0.0–0.2)

## 2021-04-17 LAB — COMPREHENSIVE METABOLIC PANEL
ALT: 15 U/L (ref 0–44)
AST: 28 U/L (ref 15–41)
Albumin: 1.7 g/dL — ABNORMAL LOW (ref 3.5–5.0)
Alkaline Phosphatase: 118 U/L (ref 38–126)
Anion gap: 6 (ref 5–15)
BUN: 5 mg/dL — ABNORMAL LOW (ref 6–20)
CO2: 37 mmol/L — ABNORMAL HIGH (ref 22–32)
Calcium: 7.4 mg/dL — ABNORMAL LOW (ref 8.9–10.3)
Chloride: 88 mmol/L — ABNORMAL LOW (ref 98–111)
Creatinine, Ser: 0.65 mg/dL (ref 0.44–1.00)
GFR, Estimated: >60 mL/min (ref >60)
Glucose, Bld: 92 mg/dL (ref 70–99)
Potassium: 2.9 mmol/L — ABNORMAL LOW (ref 3.5–5.1)
Sodium: 131 mmol/L — ABNORMAL LOW (ref 135–145)
Total Bilirubin: 0.9 mg/dL (ref 0.3–1.2)
Total Protein: 5.4 g/dL — ABNORMAL LOW (ref 6.5–8.1)

## 2021-04-17 LAB — PROTIME-INR
INR: 5.6 (ref 0.8–1.2)
Prothrombin Time: 50.6 seconds — ABNORMAL HIGH (ref 11.4–15.2)

## 2021-04-17 LAB — MAGNESIUM: Magnesium: 2.2 mg/dL (ref 1.7–2.4)

## 2021-04-17 LAB — PHOSPHORUS: Phosphorus: 3.5 mg/dL (ref 2.5–4.6)

## 2021-04-17 MED ORDER — POTASSIUM CHLORIDE CRYS ER 20 MEQ PO TBCR
40.0000 meq | EXTENDED_RELEASE_TABLET | Freq: Once | ORAL | Status: AC
  Administered 2021-04-17: 40 meq via ORAL
  Filled 2021-04-17: qty 4

## 2021-04-17 MED ORDER — PANTOPRAZOLE SODIUM 40 MG PO TBEC
40.0000 mg | DELAYED_RELEASE_TABLET | Freq: Every day | ORAL | Status: DC
  Administered 2021-04-17 – 2021-04-18 (×2): 40 mg via ORAL
  Filled 2021-04-17 (×3): qty 1

## 2021-04-17 MED ORDER — TRAMADOL HCL 50 MG PO TABS
50.0000 mg | ORAL_TABLET | Freq: Four times a day (QID) | ORAL | Status: DC | PRN
  Administered 2021-04-17 (×2): 50 mg via ORAL
  Filled 2021-04-17 (×2): qty 1

## 2021-04-17 MED ORDER — WARFARIN - PHARMACIST DOSING INPATIENT: Freq: Every day | Status: DC

## 2021-04-17 MED ORDER — POTASSIUM CHLORIDE IN NACL 20-0.9 MEQ/L-% IV SOLN
INTRAVENOUS | Status: DC
  Filled 2021-04-17 (×6): qty 1000

## 2021-04-17 MED ORDER — ACETAMINOPHEN 500 MG PO TABS
500.0000 mg | ORAL_TABLET | Freq: Four times a day (QID) | ORAL | Status: DC | PRN
  Administered 2021-04-19: 500 mg via ORAL
  Filled 2021-04-17: qty 1

## 2021-04-17 NOTE — Progress Notes (Signed)
PT Cancellation Note  Patient Details Name: Tina Sharp MRN: [id number] DOB: February 25, 1966   Cancelled Treatment:    Reason Eval/Treat Not Completed: Medical issues which prohibited therapy (Chart reviewed, therapy evaluation contraindicated at this time. INR: 5.6, K+ 2.9. Will defer evlauation to later date/time untl medically appropriate.)  11:43 AM, 04/17/21 Etta Grandchild, PT, DPT Physical Therapist - [facility]  [phone] ([place])    India Hook C 04/17/2021, 11:43 AM

## 2021-04-17 NOTE — Progress Notes (Signed)
Per dr. Thereasa Solo hold scheduled metoprolol dose this am. Will continue t monitor

## 2021-04-17 NOTE — Progress Notes (Addendum)
OT Cancellation Note  Patient Details Name: Tina Sharp MRN: [id number] DOB: 31-Aug-1965   Cancelled Treatment:    Reason Eval/Treat Not Completed: Medical issues which prohibited therapy. Order received, chart reviewed. Rehab services contraindicated at this time (Pt's INR: 5.6, K+ 2.9, BP 84/54). Will complete OT evaluation at a later date, as pt is medically appropriate.   Josiah Lobo, PhD, MS, OTR/L 04/17/21, 11:47 AM

## 2021-04-17 NOTE — Progress Notes (Signed)
Made. Dr. Thereasa Solo aware bp 84/84 map 65 heart rate 87. Has scheduled metoprolol '25mg'$ . Waiting for orders. Will continue to monitor

## 2021-04-17 NOTE — Progress Notes (Signed)
Initial Nutrition Assessment  DOCUMENTATION CODES:  Not applicable  INTERVENTION:  Continue current diet as ordered, encourage PO intake Continue MVI, thiamine, and folic acid for hx of EtOH abuse  NUTRITION DIAGNOSIS:  Altered nutrition lab value (Na, Cl, K) related to  (EtOH abuse) as evidenced by  (low electrolyte levels on morning labs).  GOAL:  Patient will meet greater than or equal to 90% of their needs  MONITOR:  PO intake  REASON FOR ASSESSMENT:  Consult Assessment of nutrition requirement/status  ASSESSMENT:  55 y.o. 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  Electrolytes found to be quite abnormal and INR supratherapeutic. Small bleeding sacral tear noted on admission.   Pt resting in bed at the time of visit, eating lunch. Pt reports that at home she normally has a good appetite and that she is eating at baseline now. Denies new GI distress or trouble swallowing. Inquired about any recent weight changes, pt reports she has edema and weight fluctuates with her fluid status. Labs improved from admission.    No intake recorded for this admission and no weight loss seen in hx. Will monitor pt's intake and add nutrition supplements if intake seems inadequate to support estimated needs.   Nutritionally Relevant Medications: Scheduled Meds:  atorvastatin  40 mg Oral q1800   docusate sodium  100 mg Oral BID   DULoxetine  60 mg Oral Daily   folic acid  1 mg Oral Daily   multivitamin with minerals  1 tablet Oral Daily   thiamine  100 mg Intravenous Daily   Continuous Infusions:  lactated ringers 75 mL/hr at 04/17/21 0804   PRN Meds: bisacodyl, ondansetron, polyethylene glycol  Labs Reviewed: Na 131, chloride 88 K 2.9  NUTRITION - FOCUSED PHYSICAL EXAM: Flowsheet Row Most Recent  Value  Orbital Region No depletion  Upper Arm Region No depletion  Thoracic and Lumbar Region No depletion  Buccal Region No depletion  Temple Region No depletion  Clavicle Bone Region No depletion  Clavicle and Acromion Bone Region No depletion  Scapular Bone Region No depletion  Dorsal Hand No depletion  Patellar Region No depletion  Anterior Thigh Region No depletion  Posterior Calf Region No depletion  Edema (RD Assessment) Mild  Hair Reviewed  Eyes Reviewed  Mouth Reviewed  Skin Reviewed  Nails Reviewed       Diet Order:   Diet Order             Diet regular Room service appropriate? Yes; Fluid consistency: Thin  Diet effective now                   EDUCATION NEEDS:  No education needs have been identified at this time  Skin:  Skin Assessment: Reviewed RN Assessment (cellulitis to the bilateral legs)  Last BM:  unknown  Height:  Ht Readings from Last 1 Encounters:  04/21/2021 '5\' 2"'$  (1.575 m)   Weight:  Wt Readings from Last 1 Encounters:  04/17/2021 73.9 kg   Ideal Body Weight:  50 kg  BMI:  Body mass index is 29.81 kg/m.  Estimated Nutritional Needs:  Kcal:  1400-1600 kcal/d Protein:  70-80g/d Fluid:  1.5-1.8L/d   Ranell Patrick, RD, LDN Clinical Dietitian Pager on [place]

## 2021-04-17 NOTE — Progress Notes (Signed)
ANTICOAGULATION CONSULT NOTE - Initial Consult  Pharmacy Consult for warfarin Indication:  hyperhomocystinemia   No Known Allergies  Patient Measurements: Height: '5\' 2"'$  (157.5 cm) Weight: 73.9 kg (163 lb) IBW/kg (Calculated) : 50.1   Vital Signs: Temp: 97.8 F (36.6 C) (09/05 1115) Temp Source: Oral (09/05 1115) BP: 92/55 (09/05 1115) Pulse Rate: 84 (09/05 1115)  Labs: Recent Labs    04/23/2021 1208 04/17/21 0429  HGB 11.4* 10.2*  HCT 32.2* 28.7*  PLT 296 248  LABPROT 81.9* 50.6*  INR 10.4* 5.6*  CREATININE 0.63 0.65    Estimated Creatinine Clearance: 73 mL/min (by C-G formula based on SCr of 0.65 mg/dL).   Medical History: Past Medical History:  Diagnosis Date   Anxiety    Breast cancer ([place]) 01/2014   Invasive lobular carcinoma, 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 ER/ PR+; Her 2 neu not overexpressed, microscopic positive margin (skin). Oncotype DX, low risk for recurrence.   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 30 tablet 2 unk at unk   B-Complex TABS Take 1 tablet by mouth daily.   unk at unk   baclofen (LIORESAL) 10 MG tablet Take 10 mg by mouth 3 (three) times daily as needed for muscle spasms.    unk at unk   buPROPion (WELLBUTRIN XL) 150 MG 24 hr tablet Take 150 mg by mouth at bedtime.   unk at unk   clonazePAM (KLONOPIN) 0.5 MG tablet Take 1 tablet (0.5 mg total) by mouth 2 (two) times daily as needed for anxiety. 4 tablet 0 unk at unk   DULoxetine (CYMBALTA) 60 MG capsule Take 60 mg by mouth at bedtime.   unk at unk   gabapentin (NEURONTIN) 600 MG tablet Take 600 mg by mouth 4 (four) times daily.   unk at unk    levocetirizine (XYZAL) 5 MG tablet Take 5 mg by mouth daily. In the afternoon   unk at unk   metolazone (ZAROXOLYN) 5 MG tablet Take 5 mg by mouth daily as needed.   unk at unk   metoprolol succinate (TOPROL-XL) 25 MG 24 hr tablet Take 25 mg by mouth daily.   unk at unk   omeprazole (PRILOSEC) 20 MG capsule Take 20 mg by mouth daily.   unk at unk   potassium chloride (K-DUR) 10 MEQ tablet Take 10 mEq by mouth 2 (two) times daily.   unk at unk   torsemide (DEMADEX) 20 MG tablet Take 60 mg by mouth daily.   unk at unk   warfarin (COUMADIN) 5 MG tablet Take 5 mg by mouth at bedtime.   unk at unk   cyanocobalamin 1000 MCG tablet Take 1 tablet (1,000 mcg total) by mouth daily. (Patient not taking: Reported on 04/18/2021) 30 tablet 2 Not Taking   Scheduled:   atorvastatin  40 mg Oral q1800   buPROPion  150 mg Oral Daily   docusate sodium  100 mg Oral BID   DULoxetine  60 mg Oral Daily   folic acid  1 mg Oral Daily   gabapentin  600 mg Oral QID   LORazepam  0-4 mg Intravenous Q6H  Followed by   Derrill Memo ON 04/18/2021] LORazepam  0-4 mg Intravenous Q12H   multivitamin with minerals  1 tablet Oral Daily   pantoprazole  40 mg Oral Daily   sodium chloride flush  3 mL Intravenous Q12H   thiamine  100 mg Oral Daily   Infusions:   0.9 % NaCl with KCl 20 mEq / L 100 mL/hr at 04/17/21 1433   PRN: acetaminophen **OR** [DISCONTINUED] acetaminophen, baclofen, bisacodyl, hydrALAZINE, HYDROcodone-acetaminophen, LORazepam **OR** LORazepam, morphine injection, nicotine, ondansetron **OR** ondansetron (ZOFRAN) IV, polyethylene glycol Anti-infectives (From admission, onward)    None       Assessment: 55YOF admitted with supratherapeutic INR on warfarin PTA for hyperhomocystenemia, with unknown last PTA dose. Patient has PMH of alcohol dependence.   INR 5.6, though patient is without gross bleeding other than bleeding from sacral skin tag. Patient given Vitamin K 2.5 mg PO x 1.    Goal of Therapy:  INR  2-3 Monitor platelets by anticoagulation protocol: Yes   Plan:  Hold warfarin until INR <3. Daily INR, CBC.   Wynelle Cleveland, PharmD Pharmacy Resident  04/17/2021 3:22 PM

## 2021-04-17 NOTE — Progress Notes (Signed)
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  In the ER she was found to have an INR of 10.4 and was noted to be bleeding from a sacral skin tag.  No other gross bleeding was appreciated.  Consultants:  None  Code Status: FULL CODE  Antimicrobials:  None  DVT prophylaxis: Warfarin  Subjective: Afebrile.  Blood pressure low in the mid [id number] systolic today.  No tachycardia.  Saturations stable on room air.  Hypokalemia and hyponatremia persist.  Hemoglobin is stable accounting for dilution from volume expansion.  INR is improving.  Alert and conversant though mildly confused at the time of my exam.  Denies chest pain nausea vomiting or abdominal pain.  Assessment & Plan:  Supratherapeutic INR Due to combination of Coumadin plus heavy alcohol abuse -INR 10.4 at presentation with moderate active bleeding -dosed with vitamin K 2.5 mg x 1 -INR trending down appropriately - no indication for further vitamin K dosing at present - Pharmacy to resume warfarin when appropriate   Recent Labs  Lab 04/15/2021 1208 04/17/21 0429  INR 10.4* 5.6*     Alcoholism Cont CIWA protocol - mild withdrawal sx today (tremor, minimal confusion)  Hypokalemia Due to alcoholism and poor nutrition -continue to supplement and follow trend  Hyponatremia Due to alcoholism as well as hypovolemia -continue volume resuscitation - monitor  Hyperhomocystinemia -prior CVA Continue Coumadin when INR falls to appropriate range   Mood disorder Continue Wellbutrin, Cymbalta, and Neurontin -holding Klonopin with use of CIWA protocol  HTN Blood pressure  low today therefore holding BB  HLD Continue Lipitor  Tobacco abuse Has been counseled on need to discontinue smoking   Family Communication: No family present at time of exam today Status is: Inpatient  Remains inpatient appropriate because:Inpatient level of care appropriate due to severity of illness  Dispo: The patient is from: Home              Anticipated d/c is to: Home              Patient currently is not medically stable to d/c.   Difficult to place patient No   Objective: Blood pressure (!) 92/55, pulse 84, temperature 97.8 F (36.6 C), temperature source Oral, resp. rate 18, height '5\' 2"'$  (1.575 m), weight 73.9 kg, SpO2 90 %.  Intake/Output Summary (Last 24 hours) at 04/17/2021 1457 Last data filed at 04/17/2021 1433 Gross per 24 hour  Intake 1855.95 ml  Output 400 ml  Net 1455.95 ml    Filed Weights   05/08/2021 1205  Weight: 73.9 kg    Examination: General: No acute respiratory distress Lungs: Clear to auscultation bilaterally without wheezes or crackles Cardiovascular: Regular rate and rhythm without murmur gallop or rub normal S1 and S2 Abdomen: Nontender, nondistended, soft, bowel sounds positive, no rebound, no ascites, no appreciable mass Extremities: No significant cyanosis, clubbing, or edema bilateral lower extremities  CBC: Recent Labs  Lab 05/07/2021 1208 04/17/21 0429  WBC 9.2 7.3  HGB 11.4* 10.2*  HCT 32.2* 28.7*  MCV 92.8 92.9  PLT 296 [id number]    Basic Metabolic Panel: Recent Labs  Lab 05/07/2021 1208 04/17/21 0429  NA 128* 131*  K 2.4* 2.9*  CL 81* 88*  CO2 34* 37*  GLUCOSE 100* 92  BUN <5* 5*  CREATININE 0.63 0.65  CALCIUM 7.7* 7.4*  MG 1.8 2.2  PHOS  --  3.5    GFR: Estimated Creatinine Clearance: 73 mL/min (by C-G formula based on SCr of 0.65 mg/dL).  Liver Function Tests: Recent Labs  Lab 04/15/2021 1208 04/17/21 0429  AST 33 28  ALT 20 15  ALKPHOS 139* 118  BILITOT 0.8 0.9  PROT 6.3* 5.4*  ALBUMIN 2.0* 1.7*      Coagulation Profile: Recent Labs  Lab 05/07/2021 1208 04/17/21 0429  INR 10.4* 5.6*      HbA1C: Hgb A1c MFr Bld  Date/Time Value Ref Range Status  07/30/2016 01:22 AM 5.6 4.8 - 5.6 % Final    Comment:    (NOTE)         Pre-diabetes: 5.7 - 6.4         Diabetes: >6.4         Glycemic control for adults with diabetes: <7.0   11/08/2015 04:39 AM 5.2 4.0 - 6.0 % Final    Recent Results (from the past 240 hour(s))  Resp Panel by RT-PCR (Flu A&B, Covid) Nasopharyngeal Swab     Status: None   Collection Time: 05/04/2021  2:49 PM   Specimen: Nasopharyngeal Swab; Nasopharyngeal(NP) swabs in vial transport medium  Result Value Ref Range Status   SARS Coronavirus 2 by RT PCR NEGATIVE NEGATIVE Final    Comment: (NOTE) SARS-CoV-2 target nucleic acids are NOT DETECTED.  The SARS-CoV-2 RNA is generally detectable in upper respiratory specimens during the acute phase of infection. The lowest concentration of SARS-CoV-2 viral copies this assay can detect is 138 copies/mL. A negative result does not preclude SARS-Cov-2 infection and should not be used as the sole basis for treatment or other patient management decisions. A negative result may occur with  improper specimen collection/handling, submission of specimen other than nasopharyngeal swab, presence of viral mutation(s) within the areas targeted by this assay, and inadequate number of viral copies(<138 copies/mL). A negative result must be combined with clinical observations, patient history, and epidemiological information. The expected result is Negative.  Fact Sheet for Patients:  EntrepreneurPulse.com.au  Fact Sheet for Healthcare Providers:  IncredibleEmployment.be  This test is no t yet approved or cleared by the [place] FDA and  has been authorized for detection and/or diagnosis of SARS-CoV-2 by FDA under an Emergency Use Authorization (EUA). This EUA will remain  in effect  (meaning this test can be used) for the duration of the COVID-19 declaration under Section 564(b)(1) of the Act, 21 U.S.C.section 360bbb-3(b)(1), unless the authorization is terminated  or revoked sooner.       Influenza A by PCR NEGATIVE NEGATIVE Final   Influenza B by PCR NEGATIVE NEGATIVE Final    Comment: (NOTE) The Xpert Xpress SARS-CoV-2/FLU/RSV plus assay is intended as an aid in the diagnosis of influenza from Nasopharyngeal swab specimens and should not be used as a sole basis for treatment. Nasal washings and aspirates are unacceptable for Xpert Xpress SARS-CoV-2/FLU/RSV testing.  Fact Sheet for Patients: EntrepreneurPulse.com.au  Fact Sheet for Healthcare Providers: IncredibleEmployment.be  This test is not yet approved or cleared by the [place] FDA and has been authorized for detection and/or diagnosis of SARS-CoV-2 by FDA under an Emergency Use Authorization (EUA). This EUA will remain in effect (meaning this test can be used) for the duration of the COVID-19 declaration  under Section 564(b)(1) of the Act, 21 U.S.C. section 360bbb-3(b)(1), unless the authorization is terminated or revoked.  Performed at [facility], [address] [place], [place] [address]      Scheduled Meds:  atorvastatin  40 mg Oral q1800   buPROPion  150 mg Oral Daily   docusate sodium  100 mg Oral BID   DULoxetine  60 mg Oral Daily   folic acid  1 mg Oral Daily   gabapentin  600 mg Oral QID   LORazepam  0-4 mg Intravenous Q6H   Followed by   Derrill Memo ON 04/18/2021] LORazepam  0-4 mg Intravenous Q12H   multivitamin with minerals  1 tablet Oral Daily   pantoprazole  40 mg Oral Daily   sodium chloride flush  3 mL Intravenous Q12H   thiamine  100 mg Oral Daily   Continuous Infusions:  0.9 % NaCl with KCl 20 mEq / L 100 mL/hr at 04/17/21 1433     LOS: 1 day   Cherene Altes, MD [facility] Office  [phone] Pager -  Text Page per Shea Evans  If 7PM-7AM, please contact night-coverage per Amion 04/17/2021, 2:57 PM

## 2021-04-17 NOTE — TOC Initial Note (Signed)
Transition of Care [facility]) - Initial/Assessment Note    Patient Details  Name: Tina Sharp MRN: [id number] Date of Birth: [birth year]  [facility]) CM/SW Contact:    Alberteen Sam, LCSW Phone Number: 04/17/2021, 11:59 AM  Clinical Narrative:                   TOC consulted for substance abuse resources, CSW brought substance abuse resources to patient at bedside however she declined at this time.   CSW completed readmission risk assessment. Patient reports she continue to see Dr. Frazier Richards as her PCP, and uses [facility] in [place]. Reports her neighbor helps her out occasionally, as she lives home alone. Neighbor helps with transportation to appointments at times, however does identify inconsistent transportation to and from appointments. CSW informed patient to call [facility] for [facility] if she needs transport. CSW has put [facility] phone number in follow up providers for it to be on her discharge paperwork.   Patient identifies no discharge needs at this time.     Expected Discharge Plan: Home/Self Care Barriers to Discharge: Continued Medical Work up   Patient Goals and CMS Choice Patient states their goals for this hospitalization and ongoing recovery are:: to go home CMS Medicare.gov Compare Post Acute Care list provided to:: Patient Choice offered to / list presented to : Patient  Expected Discharge Plan and Services Expected Discharge Plan: Home/Self Care       Living arrangements for the past 2 months: Single Family Home                                      Prior Living Arrangements/Services Living arrangements for the past 2 months: Single Family Home Lives with:: Self Patient language and need for interpreter reviewed:: Yes Do you feel safe going back to the place where you live?: Yes               Activities of Daily Living Home Assistive Devices/Equipment: Environmental consultant (specify type), Shower chair  with back, Raised toilet seat with rails ADL Screening (condition at time of admission) Patient's cognitive ability adequate to safely complete daily activities?: Yes Is the patient deaf or have difficulty hearing?: No Does the patient have difficulty seeing, even when wearing glasses/contacts?: No Does the patient have difficulty concentrating, remembering, or making decisions?: Yes Patient able to express need for assistance with ADLs?: Yes Does the patient have difficulty dressing or bathing?: Yes Independently performs ADLs?: No Communication: Independent Dressing (OT): Independent Grooming: Independent Feeding: Independent Bathing: Needs assistance Is this a change from baseline?: Pre-admission baseline Toileting: Independent In/Out Bed: Independent Walks in Home: Independent Does the patient have difficulty walking or climbing stairs?: Yes Weakness of Legs: Both Weakness of Arms/Hands: None  Permission Sought/Granted                  Emotional Assessment Appearance:: Appears stated age Attitude/Demeanor/Rapport: Gracious Affect (typically observed): Calm Orientation: : Oriented to Self, Oriented to Place, Oriented to  Time, Oriented to Situation Alcohol / Substance Use: Not Applicable Psych Involvement: No (comment)  Admission diagnosis:  Hypokalemia [E87.6] Alcohol abuse [F10.10] Hyponatremia [E87.1] Supratherapeutic INR [R79.1] Warfarin overdosage, accidental or unintentional, initial encounter [T45.511A] Patient Active Problem List   Diagnosis Date Noted   Supratherapeutic INR 05/10/2021   Alcohol dependence syndrome ([place]) 04/21/2021   History of cryptogenic cerebrovascular accident (CVA) with  residual deficit 10/05/2019   Hyponatremia [id number]   Alcoholic liver disease ([place]) 10/05/2019   Chronic anticoagulation 10/05/2019   History of breast cancer 10/05/2019   Alcohol withdrawal delirium, acute, mixed level of activity ([place]) 10/05/2019   Alcohol  withdrawal syndrome, uncomplicated ([place]) [id number]   Fall 09/12/2018   Lymphedema 04/30/2018   Hypertension 01/14/2018   Leg swelling 01/14/2018   Breast calcifications on mammogram 06/24/2017   Hip fracture ([place]) 07/30/2016   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/15/2016   Rotator cuff tendinitis, left 06/15/2016   Neuropathy 01/03/2016   Ataxia 01/03/2016   Myalgia 09/08/2015   Depression 09/06/2015   Cerebrovascular accident (CVA) due to thrombosis of posterior cerebral artery ([place]) 08/18/2015   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   Stroke with cerebral ischemia ([place]) 07/12/2015   HLD (hyperlipidemia) 07/12/2015   CVA (cerebral infarction) 06/21/2015   TIA (transient ischemic attack) 06/21/2015   Macrocytic anemia 06/20/2015   Headache 06/14/2015   Cigarette smoker 06/14/2015   Hypokalemia 06/12/2015   Stroke (cerebrum) ([place]) cryptogenic R brain infarcts s/p IV tPA  06/11/2015   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

## 2021-04-18 DIAGNOSIS — R791 Abnormal coagulation profile: Secondary | ICD-10-CM | POA: Diagnosis not present

## 2021-04-18 DIAGNOSIS — F10231 Alcohol dependence with withdrawal delirium: Secondary | ICD-10-CM

## 2021-04-18 LAB — COMPREHENSIVE METABOLIC PANEL
ALT: 17 U/L (ref 0–44)
AST: 28 U/L (ref 15–41)
Albumin: 1.9 g/dL — ABNORMAL LOW (ref 3.5–5.0)
Alkaline Phosphatase: 126 U/L (ref 38–126)
Anion gap: 6 (ref 5–15)
BUN: 6 mg/dL (ref 6–20)
CO2: 31 mmol/L (ref 22–32)
Calcium: 7.4 mg/dL — ABNORMAL LOW (ref 8.9–10.3)
Chloride: 95 mmol/L — ABNORMAL LOW (ref 98–111)
Creatinine, Ser: 0.63 mg/dL (ref 0.44–1.00)
GFR, Estimated: >60 mL/min (ref >60)
Glucose, Bld: 81 mg/dL (ref 70–99)
Potassium: 4.2 mmol/L (ref 3.5–5.1)
Sodium: 132 mmol/L — ABNORMAL LOW (ref 135–145)
Total Bilirubin: 0.8 mg/dL (ref 0.3–1.2)
Total Protein: 5.6 g/dL — ABNORMAL LOW (ref 6.5–8.1)

## 2021-04-18 LAB — HIV ANTIBODY (ROUTINE TESTING W REFLEX): HIV Screen 4th Generation wRfx: NONREACTIVE

## 2021-04-18 LAB — CBC
HCT: 31.9 % — ABNORMAL LOW (ref 36.0–46.0)
Hemoglobin: 10.7 g/dL — ABNORMAL LOW (ref 12.0–15.0)
MCH: 32 pg (ref 26.0–34.0)
MCHC: 33.5 g/dL (ref 30.0–36.0)
MCV: 95.5 fL (ref 80.0–100.0)
Platelets: 286 10*3/uL (ref 150–400)
RBC: 3.34 MIL/uL — ABNORMAL LOW (ref 3.87–5.11)
RDW: 14.7 % (ref 11.5–15.5)
WBC: 8.6 10*3/uL (ref 4.0–10.5)
nRBC: 0 % (ref 0.0–0.2)

## 2021-04-18 LAB — PHOSPHORUS: Phosphorus: 2.3 mg/dL — ABNORMAL LOW (ref 2.5–4.6)

## 2021-04-18 LAB — PROTIME-INR
INR: 5.1 (ref 0.8–1.2)
Prothrombin Time: 47.2 seconds — ABNORMAL HIGH (ref 11.4–15.2)

## 2021-04-18 LAB — MAGNESIUM: Magnesium: 1.9 mg/dL (ref 1.7–2.4)

## 2021-04-18 MED ORDER — DIAZEPAM 5 MG PO TABS
10.0000 mg | ORAL_TABLET | Freq: Three times a day (TID) | ORAL | Status: AC
  Administered 2021-04-18 – 2021-04-19 (×3): 10 mg via ORAL
  Filled 2021-04-18 (×3): qty 2

## 2021-04-18 NOTE — Progress Notes (Signed)
PT Cancellation Note  Patient Details Name: Tina Sharp MRN: [id number] DOB: [birth year]   Cancelled Treatment:    Reason Eval/Treat Not Completed: Patient not medically ready. Elevated INR 5.1, contraindicated per PT guidelines. PT to reassess as able.   [employer], SPT

## 2021-04-18 NOTE — Evaluation (Signed)
Occupational Therapy Evaluation Patient Details Name: Tina Sharp MRN: [id number] DOB: 1966/06/03 Today's Date: 04/18/2021    History of Present Illness Tina Sharp is a 55 y.o. 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   Clinical Impression   Tina Sharp presents today with generalized weakness, limited endurance, UE tremors, confusion, impaired balance, and pain. RN provided clearance for pt participation in OT evaluation. Prior to admission, pt reports living alone in a 1-story, level-entry house. She states that she has no difficulty with bathing or toileting, but that she is unable to put on socks or shoes (other than slip-ons). She does not drive and seldom leaves her home. A neighbor drives her to medical appts, and daughters or neighbors provide grocery shopping. Tina Sharp states she is able to prepare simple foods such as sandwiches. She has home help 3 days/week, a few hours each visit, to help primarily with household tasks such as cleaning, laundry, and cooking. Tina Sharp uses a RW for ambulation w/in the house and reports having "several" falls (she is unable to give a specific number), including one fall this year that resulted in a broken arm. She states that she smokes and drinks alcohol (chart review lists 2-4 bottles of wine per day); she reports that she would like to quit smoking but does not have any desire to cut down on alcohol use. During today's evaluation, she complained of back and shoulder pain, was unable to come into upright sitting on EOB with Max A, required Max A for LB dressing, declined eating any of her breakfast, stating she had no appetite. She was lucid at moments but demonstrated confusion and high levels of agitation at other times. She screamed out sentences  such as "I want to get off this conveyer belt immediately!" and "Help me -- I am sinking into a hole in the floor!" Recommend ongoing OT while pt is hospitalized. Based on her presentation today, would have to recommend SNF post DC. However, as she moves beyond the acute stage of alcohol withdrawal, her cognition and fxl mobility may well improve considerably, and another DC plan may be possible.     Follow Up Recommendations  SNF    Equipment Recommendations  None recommended by OT    Recommendations for Other Services       Precautions / Restrictions Precautions Precautions: Fall Restrictions Weight Bearing Restrictions: No      Mobility Bed Mobility Overal bed mobility: Needs Assistance Bed Mobility: Supine to Sit;Sit to Supine     Supine to sit: Max assist Sit to supine: Max assist        Transfers Overall transfer level: Needs assistance               General transfer comment: unable    Balance Overall balance assessment: Needs assistance Sitting-balance support: Bilateral upper extremity supported Sitting balance-Leahy Scale: Poor Sitting balance - Comments: unable to maintain sitting balance w/o Max A     Standing balance-Leahy Scale: Zero                             ADL either performed or assessed with clinical judgement   ADL Overall ADL's : Needs assistance/impaired  Lower Body Dressing: Maximal assistance   Toilet Transfer: Maximal assistance           Functional mobility during ADLs: Maximal assistance       Vision         Perception     Praxis      Pertinent Vitals/Pain Pain Assessment: 0-10 Pain Score: 10-Worst pain ever Pain Location: back, shoulders Pain Descriptors / Indicators: Grimacing;Crying Pain Intervention(s): Limited activity within patient's tolerance;Repositioned;Monitored during session     Hand Dominance Right   Extremity/Trunk Assessment Upper Extremity  Assessment Upper Extremity Assessment: Generalized weakness   Lower Extremity Assessment Lower Extremity Assessment: Generalized weakness       Communication Communication Communication: No difficulties   Cognition Arousal/Alertness: Lethargic Behavior During Therapy: Agitated;Anxious Overall Cognitive Status: Impaired/Different from baseline                                 General Comments: some moments of clarity interspersed with confusion, agitation   General Comments  b/l LE dry, red, with multiple patches of broken skin    Exercises Other Exercises Other Exercises: bed mobility, dressing, self-feeding. Educ re: POC, DC recs, harm reduction, substance misuse   Shoulder Instructions      Home Living Family/patient expects to be discharged to:: Private residence Living Arrangements: Alone Available Help at Discharge: Family;Neighbor;Available PRN/intermittently Type of Home: House Home Access: Ramped entrance     Home Layout: One level     Bathroom Shower/Tub: Walk-in shower (walk-in bathtub)   Bathroom Toilet: Handicapped height     Home Equipment: Environmental consultant - 2 wheels;Wheelchair - manual;Grab bars - tub/shower;Grab bars - toilet;Shower seat;Shower seat - built in          Prior Functioning/Environment Level of Independence: Needs assistance  Gait / Transfers Assistance Needed: uses RW for ambulation, reports multiple falls ADL's / Homemaking Assistance Needed: Reports no difficulties with bed mobility, bathing, or toileting. Requires asst for LB dressing. Daughters and neighbors shop for groceries, pt prepares sandwiches and other no-cook meals. Does not drive.            OT Problem List: Decreased strength;Impaired balance (sitting and/or standing);Decreased cognition;Pain;Decreased activity tolerance;Decreased coordination;Decreased range of motion;Decreased knowledge of use of DME or AE      OT Treatment/Interventions: Self-care/ADL  training;DME and/or AE instruction;Therapeutic activities;Balance training;Therapeutic exercise;Patient/family education    OT Goals(Current goals can be found in the care plan section) Acute Rehab OT Goals Patient Stated Goal: to feel better OT Goal Formulation: With patient Time For Goal Achievement: 05/02/21 Potential to Achieve Goals: Good ADL Goals Pt Will Perform Grooming: with modified independence;standing Pt Will Perform Lower Body Dressing: with modified independence;sit to/from stand (using AE as needed) Pt/caregiver will Perform Home Exercise Program: Increased strength;Independently;Increased ROM Additional ADL Goal #1: Pt will be able to identify/demonstrate 2+ falls prevention strategies  OT Frequency: Min 1X/week   Barriers to D/C: Decreased caregiver support          Co-evaluation              AM-PAC OT "6 Clicks" Daily Activity     Outcome Measure Help from another person eating meals?: A Little Help from another person taking care of personal grooming?: A Little Help from another person toileting, which includes using toliet, bedpan, or urinal?: A Lot Help from another person bathing (including washing, rinsing, drying)?: A Lot Help from another person to put on and taking  off regular upper body clothing?: A Lot Help from another person to put on and taking off regular lower body clothing?: A Lot 6 Click Score: 14   End of Session Equipment Utilized During Treatment: Surveyor, mining Communication: Other (comment) (clearance to evaluation pt, giving high INR)  Activity Tolerance: Treatment limited secondary to agitation Patient left: in bed;with call bell/phone within reach;with bed alarm set  OT Visit Diagnosis: Unsteadiness on feet (R26.81);Repeated falls (R29.6);Muscle weakness (generalized) (M62.81);History of falling (Z91.81);Pain                Time: [id number] OT Time Calculation (min): 36 min Charges:  OT General Charges $OT Visit: 1  Visit OT Evaluation $OT Eval Moderate Complexity: 1 Mod OT Treatments $Self Care/Home Management : 23-37 mins Josiah Lobo, PhD, MS, OTR/L 04/18/21, 1:35 PM

## 2021-04-18 NOTE — Progress Notes (Signed)
ANTICOAGULATION CONSULT NOTE - Initial Consult  Pharmacy Consult for warfarin Indication:  hyperhomocystinemia  w/ h/o CVA  No Known Allergies  Patient Measurements: Height: '5\' 2"'$  (157.5 cm) Weight: 73.9 kg (163 lb) IBW/kg (Calculated) : 50.1   Vital Signs: Temp: 98 F (36.7 C) (09/06 0751) Temp Source: Oral (09/06 0354) BP: 132/89 (09/06 0751) Pulse Rate: 97 (09/06 0751)  Labs: Recent Labs    04/20/2021 1208 04/17/21 0429 04/18/21 0404  HGB 11.4* 10.2* 10.7*  HCT 32.2* 28.7* 31.9*  PLT 296 248 286  LABPROT 81.9* 50.6* 47.2*  INR 10.4* 5.6* 5.1*  CREATININE 0.63 0.65 0.63     Estimated Creatinine Clearance: 73 mL/min (by C-G formula based on SCr of 0.63 mg/dL).   Medical History: Past Medical History:  Diagnosis Date   Anxiety    Breast cancer ([place]) 01/2014   Invasive lobular carcinoma, 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 ER/ PR+; Her 2 neu not overexpressed, microscopic positive margin (skin). Oncotype DX, low risk for recurrence.   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 30 tablet 2 unk at unk   B-Complex TABS Take 1 tablet by mouth daily.   unk at unk   baclofen (LIORESAL) 10 MG tablet Take 10 mg by mouth 3 (three) times daily as needed for muscle spasms.    unk at unk   buPROPion (WELLBUTRIN XL) 150 MG 24 hr tablet Take 150 mg by mouth at bedtime.   unk at unk   clonazePAM (KLONOPIN) 0.5 MG tablet Take 1 tablet (0.5 mg total) by mouth 2 (two) times daily as needed for anxiety. 4 tablet 0 unk at unk   DULoxetine (CYMBALTA) 60 MG capsule Take 60 mg by mouth at bedtime.   unk at unk   gabapentin (NEURONTIN) 600 MG tablet Take  600 mg by mouth 4 (four) times daily.   unk at unk   levocetirizine (XYZAL) 5 MG tablet Take 5 mg by mouth daily. In the afternoon   unk at unk   metolazone (ZAROXOLYN) 5 MG tablet Take 5 mg by mouth daily as needed.   unk at unk   metoprolol succinate (TOPROL-XL) 25 MG 24 hr tablet Take 25 mg by mouth daily.   unk at unk   omeprazole (PRILOSEC) 20 MG capsule Take 20 mg by mouth daily.   unk at unk   potassium chloride (K-DUR) 10 MEQ tablet Take 10 mEq by mouth 2 (two) times daily.   unk at unk   torsemide (DEMADEX) 20 MG tablet Take 60 mg by mouth daily.   unk at unk   warfarin (COUMADIN) 5 MG tablet Take 5 mg by mouth at bedtime.   unk at unk   cyanocobalamin 1000 MCG tablet Take 1 tablet (1,000 mcg total) by mouth daily. (Patient not taking: Reported on 04/17/2021) 30 tablet 2 Not Taking   Scheduled:   atorvastatin  40 mg Oral q1800   buPROPion  150 mg Oral Daily   docusate sodium  100 mg Oral BID   DULoxetine  60 mg Oral Daily   folic acid  1 mg Oral Daily   gabapentin  600 mg Oral QID   LORazepam  0-4 mg Intravenous Q6H   Followed by   LORazepam  0-4 mg Intravenous Q12H   multivitamin with minerals  1 tablet Oral Daily   pantoprazole  40 mg Oral Daily   sodium chloride flush  3 mL Intravenous Q12H   thiamine  100 mg Oral Daily   Warfarin - Pharmacist Dosing Inpatient   Does not apply q1600   Infusions:   0.9 % NaCl with KCl 20 mEq / L 100 mL/hr at 04/18/21 0827   PRN: acetaminophen **OR** [DISCONTINUED] acetaminophen, baclofen, bisacodyl, hydrALAZINE, LORazepam **OR** LORazepam, nicotine, ondansetron **OR** ondansetron (ZOFRAN) IV, polyethylene glycol, traMADol Anti-infectives (From admission, onward)    None       Assessment: 55 YO Female admitted with supratherapeutic INR on 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 Patient has PMH of alcohol dependence & elevation of INR suspected from excessive intake.   9/4-5: INR 10.4>5.6 after  Vitamin K 2.5 mg PO x 1, though patient is without gross bleeding other than bleeding from sacral skin tag.  9/6: INR 5.1; continue to hold.  Goal of Therapy:  INR 2-3 Monitor platelets by anticoagulation protocol: Yes   Plan:  Hold warfarin until INR <3. Daily INR, CBC. 9/6: INR 5.1; continue to hold.  Lorna Dibble, PharmD, [facility] Clinical Pharmacist 04/18/2021 8:30 AM

## 2021-04-18 NOTE — Progress Notes (Addendum)
Progress Note    Tina Sharp  [id number] DOB: 10/17/1965  DOA: 05/09/2021 PCP: Kirk Ruths, MD      Brief Narrative:    Medical records reviewed and are as summarized below:  Tina Sharp is a 55 y.o. 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  His INR was elevated at 10.4 and he was noted to be bleeding from his sacral skin tag.       Assessment/Plan:   Principal Problem:   Supratherapeutic INR Active Problems:   Cigarette smoker   Hypokalemia   Hyperhomocysteinemia (HCC)   HLD (hyperlipidemia)   Hypertension   Hyponatremia   Alcohol dependence syndrome (HCC)   Nutrition Problem: Altered nutrition lab value (Na, Cl, K) Etiology:  (EtOH abuse)  Signs/Symptoms:  (low electrolyte levels on morning labs)   Body mass index is 29.81 kg/m.   Supratherapeutic INR Due to combination of Coumadin plus heavy alcohol abuse -INR 10.4 at presentation with moderate active bleeding -dosed with vitamin K 2.5 mg x 1.  INR is trending down (INR is 5.1).  Continue to monitor.  Pharmacist to assist with Coumadin management and INR monitoring.   Alcohol use disorder with alcohol withdrawal syndrome, confusion/acute toxic encephalopathy: Continue Ativan as needed per CIWA protocol.  Already scheduled diazepam.  Hypokalemia: Improved.  Continue to monitor.   Hyponatremia: Improving.  Discontinue IV fluids and monitor sodium level.   Hyperhomocystinemia -prior CVA Resume warfarin when INR falls into appropriate range.    Other comorbidities include mood disorder, hypertension, hyperlipidemia, tobacco use disorderMood disorder           Diet Order             Diet regular Room service appropriate? Yes; Fluid  consistency: Thin  Diet effective now                      Consultants: None  Procedures: None    Medications:    atorvastatin  40 mg Oral q1800   buPROPion  150 mg Oral Daily   docusate sodium  100 mg Oral BID   DULoxetine  60 mg Oral Daily   folic acid  1 mg Oral Daily   gabapentin  600 mg Oral QID   LORazepam  0-4 mg Intravenous Q6H   Followed by   LORazepam  0-4 mg Intravenous Q12H   multivitamin with minerals  1 tablet Oral Daily   pantoprazole  40 mg Oral Daily   sodium chloride flush  3 mL Intravenous Q12H   thiamine  100 mg Oral Daily   Warfarin - Pharmacist Dosing Inpatient   Does not apply q1600   Continuous Infusions:  0.9 % NaCl with KCl 20 mEq / L 100 mL/hr at 04/18/21 0827     Anti-infectives (From admission, onward)    None              Family Communication/Anticipated D/C date and plan/Code Status   DVT prophylaxis: SCDs Start: 04/19/2021 1433     Code Status: Full Code  Family Communication: None Disposition Plan:    Status is: Inpatient  Remains inpatient appropriate because:IV treatments appropriate due to intensity of illness or inability to take PO and Inpatient level of care appropriate due to severity of  illness  Dispo: The patient is from: Home              Anticipated d/c is to: Home              Patient currently is not medically stable to d/c.   Difficult to place patient No           Subjective:   Interval events noted.  She's confused and unable to provide adequate history.  Objective:    Vitals:   04/17/21 2006 04/18/21 0354 04/18/21 0751 04/18/21 1209  BP: 90/60 127/83 132/89 (!) 141/96  Pulse: 87 90 97 (!) 104  Resp: '18 17 18 18  '$ Temp: 98.3 F (36.8 C) 98.4 F (36.9 C) 98 F (36.7 C) 98.3 F (36.8 C)  TempSrc:  Oral    SpO2: 95% 96% 90% (!) 86%  Weight:      Height:       No data found.   Intake/Output Summary (Last 24 hours) at 04/18/2021 1221 Last data filed at 04/18/2021  [id number] Gross per 24 hour  Intake 508.13 ml  Output --  Net 508.13 ml   Filed Weights   04/14/2021 1205  Weight: 73.9 kg    Exam:   GEN: NAD SKIN: No rash EYES: EOMI ENT: MMM CV: RRR PULM: CTA B ABD: soft, ND, NT, +BS CNS: AAO x 2 (person and place).  She moves all extremities spontaneously.  Tremors of bilateral hands EXT: No edema or tenderness PSYCH: Calm and cooperative.  She has already auditory hallucinations      Data Reviewed:   I have personally reviewed following labs and imaging studies:  Labs: Labs show the following:   Basic Metabolic Panel: Recent Labs  Lab 05/09/2021 1208 04/17/21 0429 04/18/21 0404  NA 128* 131* 132*  K 2.4* 2.9* 4.2  CL 81* 88* 95*  CO2 34* 37* 31  GLUCOSE 100* 92 81  BUN <5* 5* 6  CREATININE 0.63 0.65 0.63  CALCIUM 7.7* 7.4* 7.4*  MG 1.8 2.2 1.9  PHOS  --  3.5 2.3*   GFR Estimated Creatinine Clearance: 73 mL/min (by C-G formula based on SCr of 0.63 mg/dL). Liver Function Tests: Recent Labs  Lab 05/01/2021 1208 04/17/21 0429 04/18/21 0404  AST 33 28 28  ALT '20 15 17  '$ ALKPHOS 139* 118 126  BILITOT 0.8 0.9 0.8  PROT 6.3* 5.4* 5.6*  ALBUMIN 2.0* 1.7* 1.9*   No results for input(s): LIPASE, AMYLASE in the last 168 hours. No results for input(s): AMMONIA in the last 168 hours. Coagulation profile Recent Labs  Lab 04/15/2021 1208 04/17/21 0429 04/18/21 0404  INR 10.4* 5.6* 5.1*    CBC: Recent Labs  Lab 05/11/2021 1208 04/17/21 0429 04/18/21 0404  WBC 9.2 7.3 8.6  HGB 11.4* 10.2* 10.7*  HCT 32.2* 28.7* 31.9*  MCV 92.8 92.9 95.5  PLT 296 248 286   Cardiac Enzymes: No results for input(s): CKTOTAL, CKMB, CKMBINDEX, TROPONINI in the last 168 hours. BNP (last 3 results) No results for input(s): PROBNP in the last 8760 hours. CBG: No results for input(s): GLUCAP in the last 168 hours. D-Dimer: No results for input(s): DDIMER in the last 72 hours. Hgb A1c: No results for input(s): HGBA1C in the last 72  hours. Lipid Profile: No results for input(s): CHOL, HDL, LDLCALC, TRIG, CHOLHDL, LDLDIRECT in the last 72 hours. Thyroid function studies: No results for input(s): TSH, T4TOTAL, T3FREE, THYROIDAB in the last 72 hours.  Invalid input(s): FREET3 Anemia work  up: No results for input(s): VITAMINB12, FOLATE, FERRITIN, TIBC, IRON, RETICCTPCT in the last 72 hours. Sepsis Labs: Recent Labs  Lab 05/07/2021 1208 04/17/21 0429 04/18/21 0404  WBC 9.2 7.3 8.6    Microbiology Recent Results (from the past 240 hour(s))  Resp Panel by RT-PCR (Flu A&B, Covid) Nasopharyngeal Swab     Status: None   Collection Time: 05/06/2021  2:49 PM   Specimen: Nasopharyngeal Swab; Nasopharyngeal(NP) swabs in vial transport medium  Result Value Ref Range Status   SARS Coronavirus 2 by RT PCR NEGATIVE NEGATIVE Final    Comment: (NOTE) SARS-CoV-2 target nucleic acids are NOT DETECTED.  The SARS-CoV-2 RNA is generally detectable in upper respiratory specimens during the acute phase of infection. The lowest concentration of SARS-CoV-2 viral copies this assay can detect is 138 copies/mL. A negative result does not preclude SARS-Cov-2 infection and should not be used as the sole basis for treatment or other patient management decisions. A negative result may occur with  improper specimen collection/handling, submission of specimen other than nasopharyngeal swab, presence of viral mutation(s) within the areas targeted by this assay, and inadequate number of viral copies(<138 copies/mL). A negative result must be combined with clinical observations, patient history, and epidemiological information. The expected result is Negative.  Fact Sheet for Patients:  EntrepreneurPulse.com.au  Fact Sheet for Healthcare Providers:  IncredibleEmployment.be  This test is no t yet approved or cleared by the [place] FDA and  has been authorized for detection and/or diagnosis of  SARS-CoV-2 by FDA under an Emergency Use Authorization (EUA). This EUA will remain  in effect (meaning this test can be used) for the duration of the COVID-19 declaration under Section 564(b)(1) of the Act, 21 U.S.C.section 360bbb-3(b)(1), unless the authorization is terminated  or revoked sooner.       Influenza A by PCR NEGATIVE NEGATIVE Final   Influenza B by PCR NEGATIVE NEGATIVE Final    Comment: (NOTE) The Xpert Xpress SARS-CoV-2/FLU/RSV plus assay is intended as an aid in the diagnosis of influenza from Nasopharyngeal swab specimens and should not be used as a sole basis for treatment. Nasal washings and aspirates are unacceptable for Xpert Xpress SARS-CoV-2/FLU/RSV testing.  Fact Sheet for Patients: EntrepreneurPulse.com.au  Fact Sheet for Healthcare Providers: IncredibleEmployment.be  This test is not yet approved or cleared by the [place] FDA and has been authorized for detection and/or diagnosis of SARS-CoV-2 by FDA under an Emergency Use Authorization (EUA). This EUA will remain in effect (meaning this test can be used) for the duration of the COVID-19 declaration under Section 564(b)(1) of the Act, 21 U.S.C. section 360bbb-3(b)(1), unless the authorization is terminated or revoked.  Performed at [facility], [address] [place], [place] [address]     Procedures and diagnostic studies:  No results found.             LOS: 2 days   Lezli Danek  [facility]   Pager on [email or web address]. If 7PM-7AM, please contact night-coverage at [email or web address]     04/18/2021, 12:21 PM

## 2021-04-19 ENCOUNTER — Inpatient Hospital Stay: Payer: Medicare HMO

## 2021-04-19 ENCOUNTER — Inpatient Hospital Stay
Admit: 2021-04-19 | Discharge: 2021-04-19 | Disposition: A | Discharge status: Home / Self Care | Payer: Medicare HMO | Attending: Pulmonary Disease | Admitting: Pulmonary Disease

## 2021-04-19 DIAGNOSIS — J90 Pleural effusion, not elsewhere classified: Secondary | ICD-10-CM | POA: Diagnosis not present

## 2021-04-19 DIAGNOSIS — I517 Cardiomegaly: Secondary | ICD-10-CM | POA: Diagnosis not present

## 2021-04-19 DIAGNOSIS — R918 Other nonspecific abnormal finding of lung field: Secondary | ICD-10-CM | POA: Diagnosis not present

## 2021-04-19 DIAGNOSIS — Z4682 Encounter for fitting and adjustment of non-vascular catheter: Secondary | ICD-10-CM | POA: Diagnosis not present

## 2021-04-19 DIAGNOSIS — R791 Abnormal coagulation profile: Secondary | ICD-10-CM | POA: Diagnosis not present

## 2021-04-19 LAB — GLUCOSE, CAPILLARY
Glucose-Capillary: 83 mg/dL (ref 70–99)
Glucose-Capillary: 135 mg/dL — ABNORMAL HIGH (ref 70–99)
Glucose-Capillary: 144 mg/dL — ABNORMAL HIGH (ref 70–99)

## 2021-04-19 LAB — BASIC METABOLIC PANEL
Anion gap: 7 (ref 5–15)
BUN: 8 mg/dL (ref 6–20)
CO2: 27 mmol/L (ref 22–32)
Calcium: 7.4 mg/dL — ABNORMAL LOW (ref 8.9–10.3)
Chloride: 98 mmol/L (ref 98–111)
Creatinine, Ser: 0.6 mg/dL (ref 0.44–1.00)
GFR, Estimated: >60 mL/min (ref >60)
Glucose, Bld: 84 mg/dL (ref 70–99)
Potassium: 4.1 mmol/L (ref 3.5–5.1)
Sodium: 132 mmol/L — ABNORMAL LOW (ref 135–145)

## 2021-04-19 LAB — URINALYSIS, COMPLETE (UACMP) WITH MICROSCOPIC
Bacteria, UA: NONE SEEN
Glucose, UA: 100 mg/dL — AB
Hgb urine dipstick: NEGATIVE
Ketones, ur: 40 mg/dL — AB
Leukocytes,Ua: NEGATIVE
Nitrite: NEGATIVE
Specific Gravity, Urine: 1.015 (ref 1.005–1.030)
pH: 7 (ref 5.0–8.0)

## 2021-04-19 LAB — CBC
HCT: 29 % — ABNORMAL LOW (ref 36.0–46.0)
Hemoglobin: 10.1 g/dL — ABNORMAL LOW (ref 12.0–15.0)
MCH: 32.9 pg (ref 26.0–34.0)
MCHC: 34.8 g/dL (ref 30.0–36.0)
MCV: 94.5 fL (ref 80.0–100.0)
Platelets: 330 10*3/uL (ref 150–400)
RBC: 3.07 MIL/uL — ABNORMAL LOW (ref 3.87–5.11)
RDW: 15.4 % (ref 11.5–15.5)
WBC: 12.9 10*3/uL — ABNORMAL HIGH (ref 4.0–10.5)
nRBC: 0 % (ref 0.0–0.2)

## 2021-04-19 LAB — PROTIME-INR
INR: 3.2 — ABNORMAL HIGH (ref 0.8–1.2)
INR: 2.7 — ABNORMAL HIGH (ref 0.8–1.2)
Prothrombin Time: 33 seconds — ABNORMAL HIGH (ref 11.4–15.2)
Prothrombin Time: 28.9 seconds — ABNORMAL HIGH (ref 11.4–15.2)

## 2021-04-19 LAB — BLOOD GAS, ARTERIAL
Acid-Base Excess: 5.9 mmol/L — ABNORMAL HIGH (ref 0.0–2.0)
Bicarbonate: 29.7 mmol/L — ABNORMAL HIGH (ref 20.0–28.0)
FIO2: 0.36
O2 Saturation: 90.3 %
Patient temperature: 37
pCO2 arterial: 39 mmHg (ref 32.0–48.0)
pH, Arterial: 7.49 — ABNORMAL HIGH (ref 7.350–7.450)
pO2, Arterial: 54 mmHg — ABNORMAL LOW (ref 83.0–108.0)

## 2021-04-19 LAB — PROCALCITONIN: Procalcitonin: 3.38 ng/mL

## 2021-04-19 LAB — BRAIN NATRIURETIC PEPTIDE: B Natriuretic Peptide: 1432.6 pg/mL — ABNORMAL HIGH (ref 0.0–100.0)

## 2021-04-19 LAB — MAGNESIUM: Magnesium: 1.9 mg/dL (ref 1.7–2.4)

## 2021-04-19 LAB — MRSA NEXT GEN BY PCR, NASAL: MRSA by PCR Next Gen: NOT DETECTED

## 2021-04-19 LAB — STREP PNEUMONIAE URINARY ANTIGEN: Strep Pneumo Urinary Antigen: NEGATIVE

## 2021-04-19 LAB — PHOSPHORUS: Phosphorus: 2.8 mg/dL (ref 2.5–4.6)

## 2021-04-19 MED ORDER — NOREPINEPHRINE 4 MG/250ML-% IV SOLN: 0.0000 ug/min | INTRAVENOUS | Status: DC

## 2021-04-19 MED ORDER — IPRATROPIUM-ALBUTEROL 0.5-2.5 (3) MG/3ML IN SOLN
RESPIRATORY_TRACT | Status: AC
  Filled 2021-04-19: qty 3

## 2021-04-19 MED ORDER — VANCOMYCIN HCL 1750 MG/350ML IV SOLN
1750.0000 mg | Freq: Once | INTRAVENOUS | Status: AC
  Administered 2021-04-19: 1750 mg via INTRAVENOUS
  Filled 2021-04-19: qty 350

## 2021-04-19 MED ORDER — GABAPENTIN 600 MG PO TABS
600.0000 mg | ORAL_TABLET | Freq: Four times a day (QID) | ORAL | Status: DC
  Administered 2021-04-19 – 2021-04-22 (×12): 600 mg
  Filled 2021-04-19 (×15): qty 1

## 2021-04-19 MED ORDER — CHLORHEXIDINE GLUCONATE 0.12% ORAL RINSE (MEDLINE KIT)
15.0000 mL | Freq: Two times a day (BID) | OROMUCOSAL | Status: DC
  Administered 2021-04-19 – 2021-04-22 (×6): 15 mL via OROMUCOSAL

## 2021-04-19 MED ORDER — FUROSEMIDE 10 MG/ML IJ SOLN
20.0000 mg | Freq: Two times a day (BID) | INTRAMUSCULAR | Status: DC
  Administered 2021-04-19 – 2021-04-22 (×6): 20 mg via INTRAVENOUS
  Filled 2021-04-19 (×6): qty 2

## 2021-04-19 MED ORDER — PANTOPRAZOLE SODIUM 40 MG PO PACK
40.0000 mg | PACK | Freq: Every day | ORAL | Status: DC
  Administered 2021-04-19 – 2021-04-22 (×4): 40 mg
  Filled 2021-04-19 (×4): qty 20

## 2021-04-19 MED ORDER — THIAMINE HCL 100 MG PO TABS
100.0000 mg | ORAL_TABLET | Freq: Every day | ORAL | Status: DC
  Administered 2021-04-20: 100 mg
  Filled 2021-04-19: qty 1

## 2021-04-19 MED ORDER — ADULT MULTIVITAMIN W/MINERALS CH
1.0000 | ORAL_TABLET | Freq: Every day | ORAL | Status: DC
  Administered 2021-04-20 – 2021-04-22 (×3): 1
  Filled 2021-04-19 (×3): qty 1

## 2021-04-19 MED ORDER — BUDESONIDE 0.5 MG/2ML IN SUSP
0.5000 mg | Freq: Two times a day (BID) | RESPIRATORY_TRACT | Status: DC
  Administered 2021-04-19 – 2021-04-22 (×6): 0.5 mg via RESPIRATORY_TRACT
  Filled 2021-04-19 (×6): qty 2

## 2021-04-19 MED ORDER — IPRATROPIUM-ALBUTEROL 0.5-2.5 (3) MG/3ML IN SOLN
3.0000 mL | RESPIRATORY_TRACT | Status: DC
  Administered 2021-04-19 – 2021-04-22 (×18): 3 mL via RESPIRATORY_TRACT
  Filled 2021-04-19 (×18): qty 3

## 2021-04-19 MED ORDER — FENTANYL CITRATE PF 50 MCG/ML IJ SOSY
100.0000 ug | PREFILLED_SYRINGE | Freq: Once | INTRAMUSCULAR | Status: DC
Start: 2021-04-19 — End: 2021-04-19

## 2021-04-19 MED ORDER — ATORVASTATIN CALCIUM 20 MG PO TABS
40.0000 mg | ORAL_TABLET | Freq: Every day | ORAL | Status: DC
  Administered 2021-04-19 – 2021-04-21 (×3): 40 mg
  Filled 2021-04-19 (×3): qty 2

## 2021-04-19 MED ORDER — POLYETHYLENE GLYCOL 3350 17 G PO PACK
17.0000 g | PACK | Freq: Every day | ORAL | Status: DC
  Administered 2021-04-19 – 2021-04-22 (×4): 17 g
  Filled 2021-04-19 (×4): qty 1

## 2021-04-19 MED ORDER — MIDODRINE HCL 5 MG PO TABS
10.0000 mg | ORAL_TABLET | Freq: Three times a day (TID) | ORAL | Status: DC
  Administered 2021-04-19: 10 mg via ORAL
  Filled 2021-04-19: qty 2

## 2021-04-19 MED ORDER — FENTANYL BOLUS VIA INFUSION
50.0000 ug | INTRAVENOUS | Status: DC | PRN
  Administered 2021-04-20 (×2): 100 ug via INTRAVENOUS
  Filled 2021-04-19: qty 100

## 2021-04-19 MED ORDER — LEVALBUTEROL HCL 0.63 MG/3ML IN NEBU
INHALATION_SOLUTION | RESPIRATORY_TRACT | Status: AC
  Filled 2021-04-19: qty 3

## 2021-04-19 MED ORDER — MIDODRINE HCL 5 MG PO TABS
10.0000 mg | ORAL_TABLET | Freq: Three times a day (TID) | ORAL | Status: DC
  Administered 2021-04-19 – 2021-04-22 (×9): 10 mg
  Filled 2021-04-19 (×9): qty 2

## 2021-04-19 MED ORDER — DOCUSATE SODIUM 50 MG/5ML PO LIQD
100.0000 mg | Freq: Two times a day (BID) | ORAL | Status: DC
  Administered 2021-04-19 – 2021-04-22 (×7): 100 mg
  Filled 2021-04-19 (×7): qty 10

## 2021-04-19 MED ORDER — SODIUM CHLORIDE 0.9 % IV SOLN
2.0000 g | Freq: Three times a day (TID) | INTRAVENOUS | Status: DC
  Administered 2021-04-19 – 2021-04-21 (×6): 2 g via INTRAVENOUS
  Filled 2021-04-19 (×8): qty 2

## 2021-04-19 MED ORDER — ETOMIDATE 2 MG/ML IV SOLN
20.0000 mg | Freq: Once | INTRAVENOUS | Status: AC
  Administered 2021-04-19: 20 mg via INTRAVENOUS

## 2021-04-19 MED ORDER — METHYLPREDNISOLONE SODIUM SUCC 125 MG IJ SOLR
125.0000 mg | Freq: Once | INTRAMUSCULAR | Status: AC
  Administered 2021-04-19: 125 mg via INTRAVENOUS
  Filled 2021-04-19: qty 2

## 2021-04-19 MED ORDER — FENTANYL 2500MCG IN NS 250ML (10MCG/ML) PREMIX INFUSION
50.0000 ug/h | INTRAVENOUS | Status: DC
  Administered 2021-04-19 – 2021-04-20 (×2): 100 ug/h via INTRAVENOUS
  Administered 2021-04-21: 150 ug/h via INTRAVENOUS
  Administered 2021-04-21: 175 ug/h via INTRAVENOUS
  Administered 2021-04-22: 150 ug/h via INTRAVENOUS
  Filled 2021-04-19 (×5): qty 250

## 2021-04-19 MED ORDER — ORAL CARE MOUTH RINSE
15.0000 mL | OROMUCOSAL | Status: DC
  Administered 2021-04-19 – 2021-04-22 (×29): 15 mL via OROMUCOSAL

## 2021-04-19 MED ORDER — SODIUM CHLORIDE 0.9 % IV SOLN
250.0000 mL | INTRAVENOUS | Status: DC
  Administered 2021-04-19: 250 mL via INTRAVENOUS

## 2021-04-19 MED ORDER — FOLIC ACID 1 MG PO TABS
1.0000 mg | ORAL_TABLET | Freq: Every day | ORAL | Status: DC
  Administered 2021-04-20 – 2021-04-22 (×3): 1 mg
  Filled 2021-04-19 (×3): qty 1

## 2021-04-19 MED ORDER — NOREPINEPHRINE 4 MG/250ML-% IV SOLN
2.0000 ug/min | INTRAVENOUS | Status: DC
  Administered 2021-04-20: 2 ug/min via INTRAVENOUS
  Administered 2021-04-21: 8 ug/min via INTRAVENOUS
  Administered 2021-04-21: 4 ug/min via INTRAVENOUS
  Administered 2021-04-22: 9 ug/min via INTRAVENOUS
  Filled 2021-04-19 (×4): qty 250

## 2021-04-19 MED ORDER — ROCURONIUM BROMIDE 10 MG/ML (PF) SYRINGE
50.0000 mg | PREFILLED_SYRINGE | Freq: Once | INTRAVENOUS | Status: AC
  Administered 2021-04-19: 50 mg via INTRAVENOUS
  Filled 2021-04-19: qty 5

## 2021-04-19 MED ORDER — ALBUMIN HUMAN 25 % IV SOLN
12.5000 g | Freq: Every day | INTRAVENOUS | Status: DC
  Administered 2021-04-19 – 2021-04-20 (×2): 12.5 g via INTRAVENOUS
  Filled 2021-04-19 (×3): qty 50

## 2021-04-19 MED ORDER — FENTANYL CITRATE (PF) 100 MCG/2ML IJ SOLN
100.0000 ug | Freq: Once | INTRAMUSCULAR | Status: AC
Start: 2021-04-19 — End: 2021-04-19
  Administered 2021-04-19: 100 ug via INTRAVENOUS

## 2021-04-19 MED ORDER — CHLORHEXIDINE GLUCONATE CLOTH 2 % EX PADS
6.0000 | MEDICATED_PAD | Freq: Every day | CUTANEOUS | Status: DC
  Administered 2021-04-19 – 2021-04-21 (×3): 6 via TOPICAL

## 2021-04-19 MED ORDER — PROPOFOL 1000 MG/100ML IV EMUL
0.0000 ug/kg/min | INTRAVENOUS | Status: DC
  Administered 2021-04-19 (×2): 20 ug/kg/min via INTRAVENOUS
  Administered 2021-04-20 (×2): 15 ug/kg/min via INTRAVENOUS
  Administered 2021-04-21 (×2): 25 ug/kg/min via INTRAVENOUS
  Administered 2021-04-21: 40 ug/kg/min via INTRAVENOUS
  Filled 2021-04-19 (×7): qty 100

## 2021-04-19 MED ORDER — LEVALBUTEROL HCL 1.25 MG/0.5ML IN NEBU
1.2500 mg | INHALATION_SOLUTION | Freq: Four times a day (QID) | RESPIRATORY_TRACT | Status: DC | PRN
  Administered 2021-04-19 – 2021-04-20 (×2): 1.25 mg via RESPIRATORY_TRACT
  Filled 2021-04-19 (×4): qty 0.5

## 2021-04-19 MED ORDER — VANCOMYCIN HCL 1500 MG/300ML IV SOLN
1500.0000 mg | INTRAVENOUS | Status: DC
  Administered 2021-04-20: 1500 mg via INTRAVENOUS
  Filled 2021-04-19 (×2): qty 300

## 2021-04-19 NOTE — Progress Notes (Addendum)
OT Cancellation Note  Patient Details Name: Tina Sharp MRN: [id number] DOB: [birth year]   Cancelled Treatment:    Reason Eval/Treat Not Completed: Medical issues which prohibited therapy;Other (comment). Pt noted with recent t/f to CCU 2/2 respiratory distress. No continue upon transfer in place. Per protocols, will complete current OT order 2/2 transition to higher level of care. Please re-consult when pt medically stabilized & appropriate for OT services.   Shara Blazing, M.S., OTR/L Ascom: [phone] 04/19/21, 11:30 AM

## 2021-04-19 NOTE — Progress Notes (Addendum)
Pt RR admission to ICU near 1100. Pt emergently intubated @ 1117 w/ fent, etomidate, and roc.   Prop and fent gtt's started.   Pt RAAS -3 to -4.   Chest and ABD x-ray completed. See results.   Lasix given this evening. 550 out for shift.   Foley inserted. Putting out clear amber urine. Urine sample sent to lab.   PCR Resp Cx sent to lab.   Family @ bedside and updated via phone.

## 2021-04-19 NOTE — Progress Notes (Signed)
Pharmacy Antibiotic Note  Tina Sharp is a 55 y.o. female admitted on 04/25/2021. Patient presented with rectal bleeding, INR elevated at 10.4. She was transferred to the ICU 9/7 with respiratory distress and hypoxia requiring intubation. Pharmacy has been consulted for vancomycin and cefepime dosing for sepsis.  Plan: Vancomycin 1750 mg IV x 1 followed by vancomycin 1500 mg IV q24h  Goal AUC 400-550 Expected AUC: 509 SCr used: 0.8  Cefepime 2 g IV q8h   Height: '5\' 2"'$  (157.5 cm) Weight: 73.9 kg (163 lb) IBW/kg (Calculated) : 50.1  Temp (24hrs), Avg:98.9 F (37.2 C), Min:97.6 F (36.4 C), Max:101.2 F (38.4 C)  Recent Labs  Lab 04/28/2021 1208 04/17/21 0429 04/18/21 0404 04/19/21 0234 04/19/21 1023  WBC 9.2 7.3 8.6  --  12.9*  CREATININE 0.63 0.65 0.63 0.60  --     Estimated Creatinine Clearance: 73 mL/min (by C-G formula based on SCr of 0.6 mg/dL).    No Known Allergies  Antimicrobials this admission: Vancomycin 9/7 >> Cefepime 9/7 >>   Microbiology results: 9/7 BCx: pending 9/7 UCx: pending  9/7 Sputum: ordered  9/7 MRSA PCR: negative  Thank you for allowing pharmacy to be a part of this patient's care.  Tawnya Crook, PharmD, BCPS Clinical Pharmacist 04/19/2021 1:23 PM

## 2021-04-19 NOTE — Progress Notes (Signed)
PROGRESS NOTE    Tina Sharp  FSF:[id number] DOB: 1965-11-07 DOA: 05/10/2021 PCP: Kirk Ruths, MD   Brief Narrative: Taken from prior notes. Tina Sharp is a 55 y.o. 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  His INR was elevated at 10.4 and he was noted to be bleeding from her sacral skin tag.  Thought to be due to combination of Coumadin use and heavy alcohol use.  Received vitamin K 2.5 mg x 1.  INR now trending down.  9/7: Patient developed respiratory distress earlier in the day.  Respiratory status continued to get worse despite getting DuoNeb.  When I saw her she was very tachycardic, tachypneic and appears confused.  At baseline patient is alert and oriented.  ABG with normal CO2 but hypoxic on 4 L of oxygen.  Chest x-ray with bilateral infiltrates which can represent pulmonary edema versus atypical pneumonia. Patient was transferred to stepdown due to worsening respiratory status and later she was intubated due to worsening confusion and markedly increased work of breathing. She also developed fever at 102-initiated sepsis work-up.  Subjective: Patient appears to be confused and in respiratory distress.  Unable to answer most of the questions. Apparently she was alert and oriented before.  Assessment & Plan:   Principal Problem:   Supratherapeutic INR Active Problems:   Cigarette smoker   Hypokalemia   Hyperhomocysteinemia (HCC)   HLD (hyperlipidemia)   Hypertension   Hyponatremia   Alcohol dependence syndrome (HCC)  Acute hypoxic respiratory failure.  Due to worsening respiratory status chest x-ray was obtained which shows bilateral infiltrate/pulmonary vascular congestion/atypical pneumonia.  She also became febrile.   Worsening mental status most likely secondary to hypoxia.  No hemoptysis. Respiratory status continued to get worse despite giving breathing treatments with DuoNeb and Xopenex.  Also received a dose of Lasix and Solu-Medrol. -Transferred patient to [place] she was intubated.  Sepsis/SIRS response.  Most likely secondary to respiratory infection.  Patient became febrile with leukocytosis, tachycardic and tachypneic, meeting sepsis criteria.  Sepsis cannot be completely ruled in at this time as no confirmed source of infection, most likely respiratory cause. -Blood cultures and urine cultures -Respiratory viral panel -Check procalcitonin -Strep pneumo and Legionella  Elevated BNP.  With concern of pulmonary edema she met CHF criteria.  Prior echocardiogram with normal EF.  BNP elevated at 1432. -Repeat echocardiogram -Diuresed as tolerated  Supratherapeutic INR.  No current obvious bleeding.  Hemoglobin at 10.1 from 11.4 on admission. INR improving, this morning it was 3.2 -Continue to monitor -Coumadin dosing per pharmacy.  Hyponatremia.  Improving. -Continue to monitor  Alcohol abuse.  CIWA of 8 this morning. -Continue with CIWA protocol  Homocystinemia.  History of prior CVA. -Continue with Coumadin once INR therapeutic-appreciate pharmacy help  Hypertension.  Blood pressure currently within goal. -Continue to monitor.  Objective: Vitals:   04/19/21 1230 04/19/21 1240 04/19/21 1250 04/19/21 1300  BP: '97/67 97/71 92/62 ' 92/66  Pulse: (!) 108 (!) 107 (!) 106 (!) 102  Resp: '15 15 15 15  ' Temp:      TempSrc:      SpO2: 94% 93% 93% 92%  Weight:      Height:        Intake/Output Summary (Last 24 hours) at 04/19/2021 1311 Last data filed at  04/18/2021 2126 Gross per 24 hour  Intake 2355.57 ml  Output 150 ml  Net 2205.57 ml   Filed Weights   04/29/2021 1205  Weight: 73.9 kg    Examination:  General exam: Lethargic lady, appears in distress.   Respiratory system:  Bilateral scattered wheeze and rhonchi with increased work of breathing, tachypneic Cardiovascular system: Tachycardia,  Gastrointestinal system: Soft, nontender, nondistended, bowel sounds positive. Central nervous system: Lethargic and appears confused.  Unable to follow most of the commands. Extremities: No edema, no cyanosis, pulses intact and symmetrical. Psychiatry: Judgement and insight appear impaired.   DVT prophylaxis: Coumadin Code Status: Full Family Communication: Updated multiple family members at bedside. Disposition Plan:  Status is: Inpatient  Remains inpatient appropriate because:Hemodynamically unstable  Dispo: The patient is from: Home              Anticipated d/c is to: Home              Patient currently is not medically stable to d/c.   Difficult to place patient No               Level of care: ICU  All the records are reviewed and case discussed with Care Management/Social Worker. Management plans discussed with the patient, nursing and they are in agreement.  Consultants:  PCCM  Procedures:  Antimicrobials:   Data Reviewed: I have personally reviewed following labs and imaging studies  CBC: Recent Labs  Lab 04/15/2021 1208 04/17/21 0429 04/18/21 0404 04/19/21 1023  WBC 9.2 7.3 8.6 12.9*  HGB 11.4* 10.2* 10.7* 10.1*  HCT 32.2* 28.7* 31.9* 29.0*  MCV 92.8 92.9 95.5 94.5  PLT 296 248 286 371   Basic Metabolic Panel: Recent Labs  Lab 05/08/2021 1208 04/17/21 0429 04/18/21 0404 04/19/21 0234  NA 128* 131* 132* 132*  K 2.4* 2.9* 4.2 4.1  CL 81* 88* 95* 98  CO2 34* 37* 31 27  GLUCOSE 100* 92 81 84  BUN <5* 5* 6 8  CREATININE 0.63 0.65 0.63 0.60  CALCIUM 7.7* 7.4* 7.4* 7.4*  MG 1.8 2.2 1.9 1.9  PHOS  --  3.5 2.3* 2.8   GFR: Estimated Creatinine Clearance: 73 mL/min (by C-G formula based on SCr of 0.6 mg/dL). Liver Function Tests: Recent Labs  Lab 05/12/2021 1208 04/17/21 0429 04/18/21 0404  AST 33 28 28  ALT '20 15 17  ' ALKPHOS 139*  118 126  BILITOT 0.8 0.9 0.8  PROT 6.3* 5.4* 5.6*  ALBUMIN 2.0* 1.7* 1.9*   No results for input(s): LIPASE, AMYLASE in the last 168 hours. No results for input(s): AMMONIA in the last 168 hours. Coagulation Profile: Recent Labs  Lab 04/29/2021 1208 04/17/21 0429 04/18/21 0404 04/19/21 0234  INR 10.4* 5.6* 5.1* 3.2*   Cardiac Enzymes: No results for input(s): CKTOTAL, CKMB, CKMBINDEX, TROPONINI in the last 168 hours. BNP (last 3 results) No results for input(s): PROBNP in the last 8760 hours. HbA1C: No results for input(s): HGBA1C in the last 72 hours. CBG: Recent Labs  Lab 04/19/21 1057  GLUCAP 83   Lipid Profile: No results for input(s): CHOL, HDL, LDLCALC, TRIG, CHOLHDL, LDLDIRECT in the last 72 hours. Thyroid Function Tests: No results for input(s): TSH, T4TOTAL, FREET4, T3FREE, THYROIDAB in the last 72 hours. Anemia Panel: No results for input(s): VITAMINB12, FOLATE, FERRITIN, TIBC, IRON, RETICCTPCT in the last 72 hours. Sepsis Labs: Recent Labs  Lab 04/19/21 1023  PROCALCITON 3.38    Recent Results (from the past 240 hour(s))  Resp  Panel by RT-PCR (Flu A&B, Covid) Nasopharyngeal Swab     Status: None   Collection Time: 04/30/2021  2:49 PM   Specimen: Nasopharyngeal Swab; Nasopharyngeal(NP) swabs in vial transport medium  Result Value Ref Range Status   SARS Coronavirus 2 by RT PCR NEGATIVE NEGATIVE Final    Comment: (NOTE) SARS-CoV-2 target nucleic acids are NOT DETECTED.  The SARS-CoV-2 RNA is generally detectable in upper respiratory specimens during the acute phase of infection. The lowest concentration of SARS-CoV-2 viral copies this assay can detect is 138 copies/mL. A negative result does not preclude SARS-Cov-2 infection and should not be used as the sole basis for treatment or other patient management decisions. A negative result may occur with  improper specimen collection/handling, submission of specimen other than nasopharyngeal swab, presence of  viral mutation(s) within the areas targeted by this assay, and inadequate number of viral copies(<138 copies/mL). A negative result must be combined with clinical observations, patient history, and epidemiological information. The expected result is Negative.  Fact Sheet for Patients:  EntrepreneurPulse.com.au  Fact Sheet for Healthcare Providers:  IncredibleEmployment.be  This test is no t yet approved or cleared by the [place] FDA and  has been authorized for detection and/or diagnosis of SARS-CoV-2 by FDA under an Emergency Use Authorization (EUA). This EUA will remain  in effect (meaning this test can be used) for the duration of the COVID-19 declaration under Section 564(b)(1) of the Act, 21 U.S.C.section 360bbb-3(b)(1), unless the authorization is terminated  or revoked sooner.       Influenza A by PCR NEGATIVE NEGATIVE Final   Influenza B by PCR NEGATIVE NEGATIVE Final    Comment: (NOTE) The Xpert Xpress SARS-CoV-2/FLU/RSV plus assay is intended as an aid in the diagnosis of influenza from Nasopharyngeal swab specimens and should not be used as a sole basis for treatment. Nasal washings and aspirates are unacceptable for Xpert Xpress SARS-CoV-2/FLU/RSV testing.  Fact Sheet for Patients: EntrepreneurPulse.com.au  Fact Sheet for Healthcare Providers: IncredibleEmployment.be  This test is not yet approved or cleared by the [place] FDA and has been authorized for detection and/or diagnosis of SARS-CoV-2 by FDA under an Emergency Use Authorization (EUA). This EUA will remain in effect (meaning this test can be used) for the duration of the COVID-19 declaration under Section 564(b)(1) of the Act, 21 U.S.C. section 360bbb-3(b)(1), unless the authorization is terminated or revoked.  Performed at [facility], [address] [place], [place] [address]      Radiology  Studies: DG Chest 2 View  Result Date: 04/19/2021 CLINICAL DATA:  Respiratory distress EXAM: CHEST - 2 VIEW COMPARISON:  07/30/2016 FINDINGS: Cardiomegaly with implantable loop recorder. Moderate to large right pleural effusion, small, layering left pleural effusion and associated atelectasis or consolidation. Diffuse bilateral heterogeneous and interstitial airspace opacity. The osseous structures are unremarkable. IMPRESSION: 1. Moderate to large right pleural effusion, small, layering left pleural effusion and associated atelectasis or consolidation. 2. Diffuse bilateral heterogeneous and interstitial airspace opacity, consistent with edema and/or infection. 3. Cardiomegaly. Electronically Signed   By: Eddie Candle M.D.   On: 04/19/2021 11:54   DG Abd 1 View  Result Date: 04/19/2021 CLINICAL DATA:  Encounter for intubation. EXAM: ABDOMEN - 1 VIEW COMPARISON:  Radiographs earlier the same date and 07/30/2016. FINDINGS: 1150 hours. Interval intubation. Tip of the endotracheal tube is 1.9 cm above the carina. Enteric tube projects below the diaphragm, overlapping the distal stomach or proximal duodenum. Stable cardiomegaly. Loop recorder overlies the chest. The lung  apices are partially excluded. There are bibasilar airspace opacities with interval slight worsening on the left. Probable bilateral pleural effusions superimposed on chronic right hemidiaphragm elevation. No evidence of pneumothorax. IMPRESSION: 1. Satisfactory position of the endotracheal and nasogastric tubes. 2. Worsening asymmetric left lung airspace opacities suspicious for pneumonia/aspiration. Electronically Signed   By: Richardean Sale M.D.   On: 04/19/2021 12:30   Portable Chest x-ray  Result Date: 04/19/2021 CLINICAL DATA:  Intubation EXAM: PORTABLE CHEST 1 VIEW COMPARISON:  Chest x-ray dated April 19, 2021 FINDINGS: Interval intubation with ET tube approximately 1.5 cm from the carina. Enteric tube partially visualized coursing  below the diaphragm. Similar left greater than right heterogeneous opacities and small bilateral pleural effusions. No evidence of pneumothorax. Visualized cardiac and mediastinal contours are unchanged. IMPRESSION: Interval intubation with ET tube approximately 1.5 cm from the carina. Electronically Signed   By: Yetta Glassman M.D.   On: 04/19/2021 12:27    Scheduled Meds:  atorvastatin  40 mg Oral q1800   buPROPion  150 mg Oral Daily   Chlorhexidine Gluconate Cloth  6 each Topical Daily   docusate  100 mg Per Tube BID   DULoxetine  60 mg Oral Daily   folic acid  1 mg Oral Daily   furosemide  20 mg Intravenous BID   gabapentin  600 mg Oral QID   ipratropium-albuterol  3 mL Nebulization Q4H   levalbuterol       LORazepam  0-4 mg Intravenous Q12H   midodrine  10 mg Oral TID WC   multivitamin with minerals  1 tablet Oral Daily   pantoprazole  40 mg Oral Daily   polyethylene glycol  17 g Per Tube Daily   sodium chloride flush  3 mL Intravenous Q12H   thiamine  100 mg Oral Daily   Warfarin - Pharmacist Dosing Inpatient   Does not apply q1600   Continuous Infusions:  sodium chloride     albumin human     fentaNYL infusion INTRAVENOUS 100 mcg/hr (04/19/21 1138)   norepinephrine (LEVOPHED) Adult infusion     propofol (DIPRIVAN) infusion 20 mcg/kg/min (04/19/21 1139)     LOS: 3 days   Time spent: 50 minutes. More than 50% of the time was spent in counseling/coordination of care  Lorella Nimrod, MD [facility]  If 7PM-7AM, please contact night-coverage [email or web address]  04/19/2021, 1:11 PM   This record has been created using Systems analyst. Errors have been sought and corrected,but may not always be located. Such creation errors do not reflect on the standard of care.

## 2021-04-19 NOTE — Progress Notes (Signed)
PT Cancellation Note  Patient Details Name: Tina Sharp MRN: [id number] DOB: [birth year]   Cancelled Treatment:    Reason Eval/Treat Not Completed: Medical issues which prohibited therapy Per RN, pt not medically appropriate for PT at this time.   [employer], SPT

## 2021-04-19 NOTE — Progress Notes (Addendum)
ANTICOAGULATION CONSULT NOTE - Initial Consult  Pharmacy Consult for warfarin Indication:  hyperhomocystinemia  w/ h/o CVA  No Known Allergies  Patient Measurements: Height: '5\' 2"'$  (157.5 cm) Weight: 73.9 kg (163 lb) IBW/kg (Calculated) : 50.1   Vital Signs: Temp: 98.3 F (36.8 C) (09/07 0741) BP: 138/88 (09/07 0741) Pulse Rate: 118 (09/07 0741)  Labs: Recent Labs    05/06/2021 1208 04/17/21 0429 04/18/21 0404 04/19/21 0234  HGB 11.4* 10.2* 10.7*  --   HCT 32.2* 28.7* 31.9*  --   PLT 296 248 286  --   LABPROT 81.9* 50.6* 47.2* 33.0*  INR 10.4* 5.6* 5.1* 3.2*  CREATININE 0.63 0.65 0.63 0.60     Estimated Creatinine Clearance: 73 mL/min (by C-G formula based on SCr of 0.6 mg/dL).   Medical History: Past Medical History:  Diagnosis Date   Anxiety    Breast cancer ([place]) 01/2014   Invasive lobular carcinoma, 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 ER/ PR+; Her 2 neu not overexpressed, microscopic positive margin (skin). Oncotype DX, low risk for recurrence.   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 30 tablet 2 unk at unk   B-Complex TABS Take 1 tablet by mouth daily.   unk at unk   baclofen (LIORESAL) 10 MG tablet Take 10 mg by mouth 3 (three) times daily as needed for muscle spasms.    unk at unk   buPROPion (WELLBUTRIN XL) 150 MG 24 hr tablet Take 150 mg by mouth at bedtime.   unk at unk   clonazePAM (KLONOPIN) 0.5 MG tablet Take 1 tablet (0.5 mg total) by mouth 2 (two) times daily as needed for anxiety. 4 tablet 0 unk at unk   DULoxetine (CYMBALTA) 60 MG capsule Take 60 mg by mouth at bedtime.   unk at unk   gabapentin (NEURONTIN) 600  MG tablet Take 600 mg by mouth 4 (four) times daily.   unk at unk   levocetirizine (XYZAL) 5 MG tablet Take 5 mg by mouth daily. In the afternoon   unk at unk   metolazone (ZAROXOLYN) 5 MG tablet Take 5 mg by mouth daily as needed.   unk at unk   metoprolol succinate (TOPROL-XL) 25 MG 24 hr tablet Take 25 mg by mouth daily.   unk at unk   omeprazole (PRILOSEC) 20 MG capsule Take 20 mg by mouth daily.   unk at unk   potassium chloride (K-DUR) 10 MEQ tablet Take 10 mEq by mouth 2 (two) times daily.   unk at unk   torsemide (DEMADEX) 20 MG tablet Take 60 mg by mouth daily.   unk at unk   warfarin (COUMADIN) 5 MG tablet Take 5 mg by mouth at bedtime.   unk at unk   cyanocobalamin 1000 MCG tablet Take 1 tablet (1,000 mcg total) by mouth daily. (Patient not taking: Reported on 05/09/2021) 30 tablet 2 Not Taking   Scheduled:   ipratropium-albuterol       atorvastatin  40 mg Oral q1800   buPROPion  150 mg Oral Daily   docusate sodium  100 mg Oral BID   DULoxetine  60  mg Oral Daily   folic acid  1 mg Oral Daily   gabapentin  600 mg Oral QID   ipratropium-albuterol  3 mL Nebulization Q4H   LORazepam  0-4 mg Intravenous Q12H   multivitamin with minerals  1 tablet Oral Daily   pantoprazole  40 mg Oral Daily   sodium chloride flush  3 mL Intravenous Q12H   thiamine  100 mg Oral Daily   Warfarin - Pharmacist Dosing Inpatient   Does not apply q1600   Infusions:    PRN: acetaminophen **OR** [DISCONTINUED] acetaminophen, baclofen, bisacodyl, hydrALAZINE, LORazepam **OR** LORazepam, nicotine, ondansetron **OR** ondansetron (ZOFRAN) IV, polyethylene glycol, traMADol Anti-infectives (From admission, onward)    None       Assessment: 55 YO Female admitted with supratherapeutic INR on 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 Patient has PMH of alcohol dependence & elevation of INR suspected from excessive intake.   9/4-5: INR 10.4>5.6 after Vitamin K 2.5 mg PO x 1,  though patient is without gross bleeding other than bleeding from sacral skin tag.  9/6: INR 5.1; continue to hold. 9/7: INR 3.2; Delta of 1.9 after 3d of holding doses. Will check afternoon INR.   Goal of Therapy:  INR 2-3 Monitor platelets by anticoagulation protocol: Yes   Plan:  Hold warfarin until INR <3. Daily INR, CBC. AM INR 3.2; Afternoon INR 2.7. Original plan was for VKA restart, but changed d/t pt respiratory decompensation req'ing CCU transfer. Hold VKA; until heparin plan determined. (See other consult note)  Lorna Dibble, PharmD, [facility] Clinical Pharmacist 04/19/2021 8:12 AM

## 2021-04-19 NOTE — Consult Note (Signed)
CRITICAL CARE PROGRESS NOTE    Name: Tina Sharp MRN: [id number] DOB: 17-Aug-1965     LOS: 3   SUBJECTIVE FINDINGS & SIGNIFICANT EVENTS    Patient description:  55 yo F arrived to MICU from medical floor with acute respiratory distress and hypoxemia in unresponsive state. PMH includes multiple strokes and CHF with progression per family at bedside.  I was able to speak with 3 family members who share that patient last week had letter from her doctor to skip jury duty due to her worsening heart failure.  She had CXR done with bilateral pleural effusions and pulmonary edema on arrival.   Lines/tubes : Airway 7.5 mm (Active)  Secured at (cm) 25 cm 04/19/21 1127  Measured From Lips 04/19/21 [place] 04/19/21 1127  Secured By [employer] 04/19/21 1127  Cuff Pressure (cm H2O) Green OR 18-26 CmH2O 04/19/21 1127     Closed System Drain 1 Right Hip Accordion (Hemovac) (Active)     Urethral Catheter GINA RN (Active)    Microbiology/Sepsis markers: Results for orders placed or performed during the hospital encounter of 04/21/2021  Resp Panel by RT-PCR (Flu A&B, Covid) Nasopharyngeal Swab     Status: None   Collection Time: 04/13/2021  2:49 PM   Specimen: Nasopharyngeal Swab; Nasopharyngeal(NP) swabs in vial transport medium  Result Value Ref Range Status   SARS Coronavirus 2 by RT PCR NEGATIVE NEGATIVE Final    Comment: (NOTE) SARS-CoV-2 target nucleic acids are NOT DETECTED.  The SARS-CoV-2 RNA is generally detectable in upper respiratory specimens during the acute phase of infection. The lowest concentration of SARS-CoV-2 viral copies this assay can detect is 138 copies/mL. A negative result does not preclude SARS-Cov-2 infection and should not be used as the sole basis for  treatment or other patient management decisions. A negative result may occur with  improper specimen collection/handling, submission of specimen other than nasopharyngeal swab, presence of viral mutation(s) within the areas targeted by this assay, and inadequate number of viral copies(<138 copies/mL). A negative result must be combined with clinical observations, patient history, and epidemiological information. The expected result is Negative.  Fact Sheet for Patients:  EntrepreneurPulse.com.au  Fact Sheet for Healthcare Providers:  IncredibleEmployment.be  This test is no t yet approved or cleared by the [place] FDA and  has been authorized for detection and/or diagnosis of SARS-CoV-2 by FDA under an Emergency Use Authorization (EUA). This EUA will remain  in effect (meaning this test can be used) for the duration of the COVID-19 declaration under Section 564(b)(1) of the Act, 21 U.S.C.section 360bbb-3(b)(1), unless the authorization is terminated  or revoked sooner.       Influenza A by PCR NEGATIVE NEGATIVE Final   Influenza B by PCR NEGATIVE NEGATIVE Final    Comment: (NOTE) The Xpert Xpress SARS-CoV-2/FLU/RSV plus assay is intended as an aid in the diagnosis of influenza from Nasopharyngeal swab specimens and should not be used as a sole basis for treatment. Nasal washings and aspirates are unacceptable for Xpert Xpress SARS-CoV-2/FLU/RSV testing.  Fact Sheet for Patients: EntrepreneurPulse.com.au  Fact Sheet for Healthcare Providers: IncredibleEmployment.be  This test is not yet approved or cleared by the [place] FDA and has been authorized for detection and/or diagnosis of SARS-CoV-2 by FDA under an Emergency Use Authorization (EUA). This EUA will remain in effect (meaning this test can be used) for the duration of the COVID-19 declaration under Section 564(b)(1) of the Act, 21  U.S.C. section  360bbb-3(b)(1), unless the authorization is terminated or revoked.  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 ER/ PR+; Her 2 neu not overexpressed, microscopic positive margin (skin). Oncotype DX, low risk for recurrence.   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 Negative ileal and colon biopsies.    EP IMPLANTABLE DEVICE N/A 06/14/2015   Procedure: Loop Recorder Insertion;  Surgeon: Evans Lance, MD;  Location: [place] [facility];  Service: Cardiovascular;  Laterality: N/A;   HIP ARTHROPLASTY Right 08/01/2016   Procedure: ARTHROPLASTY BIPOLAR HIP (HEMIARTHROPLASTY);  Surgeon: Dereck Leep, MD;  Location: [facility] ORS;  Service: Orthopedics;  Laterality: Right;   KYPHOPLASTY N/A 09/16/2018   Procedure: KYPHOPLASTY T12;  Surgeon: Hessie Knows, MD;  Location: [facility] ORS;  Service: Orthopedics;  Laterality: N/A;   LAPAROSCOPIC HYSTERECTOMY  2010   [facility]   MASTECTOMY Left 2016   REDUCTION MAMMAPLASTY Right 2016   Lift   reduction mammoplasty Right 04/08/14   Dr Tula Nakayama   TEE WITHOUT CARDIOVERSION N/A 06/14/2015   Procedure: TRANSESOPHAGEAL ECHOCARDIOGRAM (TEE);  Surgeon: Josue Hector, MD;  Location: [facility];  Service: Cardiovascular;  Laterality: N/A;   TONSILLECTOMY AND  ADENOIDECTOMY     pt was 55 years old     FAMILY HISTORY   Family History  Problem Relation Age of Onset   Breast cancer Mother 54   Ovarian cancer Other    Stroke Paternal Grandmother    Breast cancer Maternal Grandmother 38   Cancer Maternal Grandfather 50       prostate   Prostate cancer Maternal Grandfather      SOCIAL HISTORY   Social History   Tobacco Use   Smoking status: Every Day    Packs/day: 0.25    Types: Cigarettes   Smokeless tobacco: Never   Tobacco comments:    E cig  Vaping Use   Vaping Use: Never used  Substance Use Topics   Alcohol use: Not Currently    Comment: 1-2 bottles per day   Drug use: No     MEDICATIONS   Current Medication:  Current Facility-Administered Medications:    acetaminophen (TYLENOL) tablet 500 mg, 500 mg, Oral, Q6H PRN, 500 mg at 04/19/21 1017 **OR** [DISCONTINUED] acetaminophen (TYLENOL) suppository 650 mg, 650 mg, Rectal, Q6H PRN, Karmen Bongo, MD   atorvastatin (LIPITOR) tablet 40 mg, 40 mg, Oral, q1800, Karmen Bongo, MD, 40 mg at 04/18/21 1700   baclofen (LIORESAL) tablet 10 mg, 10 mg, Oral, TID PRN, Karmen Bongo, MD   bisacodyl (DULCOLAX) EC tablet 5 mg, 5 mg, Oral, Daily PRN, Karmen Bongo, MD   buPROPion (WELLBUTRIN XL) 24 hr tablet 150 mg, 150 mg, Oral, Daily, Karmen Bongo, MD, 150 mg at  04/18/21 0842   Chlorhexidine Gluconate Cloth 2 % PADS 6 each, 6 each, Topical, Daily, Darel Hong D, NP, 6 each at 04/19/21 1139   docusate (COLACE) 50 MG/5ML liquid 100 mg, 100 mg, Per Tube, BID, Bradly Bienenstock, NP   DULoxetine (CYMBALTA) DR capsule 60 mg, 60 mg, Oral, Daily, Karmen Bongo, MD, 60 mg at 04/18/21 0842   fentaNYL (SUBLIMAZE) bolus via infusion 50-100 mcg, 50-100 mcg, Intravenous, Q15 min PRN, Darel Hong D, NP   fentaNYL 2581mg in NS 2566m(1042mml) infusion-PREMIX, 50-200 mcg/hr, Intravenous, Continuous, KeeDarel Hong NP, Last Rate: 10 mL/hr at 04/19/21 1138, 100 mcg/hr at 04/20/64/78134696folic acid (FOLVITE) tablet 1 mg, 1 mg, Oral, Daily, YatKarmen BongoD, 1 mg at 04/18/21 0843   gabapentin (NEURONTIN) tablet 600 mg, 600 mg, Oral, QID, YatKarmen BongoD, 600 mg at 04/18/21 2123   hydrALAZINE (APRESOLINE) injection 5 mg, 5 mg, Intravenous, Q4H PRN, YatKarmen BongoD   ipratropium-albuterol (DUONEB) 0.5-2.5 (3) MG/3ML nebulizer solution 3 mL, 3 mL, Nebulization, Q4H, AmiLorella NimrodD   levalbuterol (XOPENEX) 0.63 MG/3ML nebulizer solution, , , ,    levalbuterol (XOPENEX) nebulizer solution 1.25 mg, 1.25 mg, Nebulization, Q6H PRN, AmiLorella NimrodD, 1.25 mg at 04/19/21 1014   [EXPIRED] LORazepam (ATIVAN) injection 0-4 mg, 0-4 mg, Intravenous, Q6H, 2 mg at 04/18/21 0840 **FOLLOWED BY** LORazepam (ATIVAN) injection 0-4 mg, 0-4 mg, Intravenous, Q12H, YatKarmen BongoD   LORazepam (ATIVAN) tablet 1-4 mg, 1-4 mg, Oral, Q1H PRN **OR** LORazepam (ATIVAN) injection 1-4 mg, 1-4 mg, Intravenous, Q1H PRN, YatKarmen BongoD, 1 mg at 04/19/21 [id number] with minerals tablet 1 tablet, 1 tablet, Oral, Daily, YatKarmen BongoD, 1 tablet at 04/18/21 [id number] (NICODERM CQ - dosed in mg/24 hr) patch 7 mg, 7 mg, Transdermal, Daily PRN, YatKarmen BongoD   ondansetron (ZONorth[facility] Surgery Centerablet 4 mg, 4 mg, Oral, Q6H PRN **OR** ondansetron (ZOFRAN) injection 4 mg, 4 mg, Intravenous, Q6H PRN, YatKarmen BongoD   pantoprazole (PROTONIX) EC tablet 40 mg, 40 mg, Oral, Daily, McCJoette Catching MD, 40 mg at 04/18/21 [id number] glycol (MIRALAX / GLYCOLAX) packet 17 g, 17 g, Oral, Daily PRN, YatKarmen BongoD   polyethylene glycol (MIRALAX / GLYCOLAX) packet 17 g, 17 g, Per Tube, Daily, KeeDarel Hong NP   propofol (DIPRIVAN) 1000 MG/100ML infusion, 0-50 mcg/kg/min, Intravenous, Continuous, KeeDarel Hong NP, Last Rate: 8.87 mL/hr at 04/19/21 1139, 20 mcg/kg/min at 04/19/21 1139   sodium chloride flush (NS) 0.9 % injection 3 mL, 3 mL, Intravenous, Q12H, YatKarmen BongoD, 3 mL at 04/19/21 1039   thiamine tablet 100 mg, 100 mg, Oral, Daily, 100 mg at 04/18/21 0842 **OR** [DISCONTINUED] thiamine (B-1) injection 100 mg, 100 mg, Intravenous, Daily, YatKarmen BongoD, 100 mg at 04/30/2021 1621   traMADol (ULTRAM) tablet 50 mg, 50 mg, Oral, Q6H PRN, McCCherene AltesD, 50 mg at 04/17/21 2353   Warfarin - Pharmacist Dosing Inpatient, , Does not apply, q1600, [facility] ALLERGIES   Patient has no known allergies.    REVIEW OF SYSTEMS     Patient is unresponsive unable to give ROS  PHYSICAL EXAMINATION   Vital Signs: Temp:  [97.6 F (36.4 C)-101.2 F (38.4 C)] 101.2 F (38.4 C) (09/07 1000) Pulse Rate:  [92-118] 112 (09/07 1200) Resp:  [15-20] 15 (09/07 1200) BP: (105-153)/(68-111) 110/78 (09/07 1200) SpO2:  [90 %-95 %] 94 % (  09/07 1200) FiO2 (%):  [50 %] 50 % (09/07 1127)  GENERAL:Age appropriate on MV HEAD: Normocephalic, atraumatic.  EYES: Pupils equal, round, reactive to light.  No scleral icterus.  MOUTH: Moist mucosal membrane. NECK: Supple. No thyromegaly. No nodules. No JVD.  PULMONARY: rhonchi and crackles bilaterallhy  CARDIOVASCULAR: S1 and S2. Regular rate and rhythm. No murmurs, rubs, or gallops.  GASTROINTESTINAL: Soft, nontender, non-distended. No masses. Positive bowel sounds. No hepatosplenomegaly.  MUSCULOSKELETAL: No swelling, clubbing, or edema.  NEUROLOGIC: GCS4t SKIN:intact,warm,dry   PERTINENT DATA     Infusions:  fentaNYL infusion INTRAVENOUS 100 mcg/hr (04/19/21 1138)   propofol (DIPRIVAN) infusion 20 mcg/kg/min (04/19/21 1139)   Scheduled Medications:  atorvastatin  40 mg Oral q1800   buPROPion  150 mg Oral Daily   Chlorhexidine Gluconate Cloth  6 each Topical Daily   docusate  100 mg Per Tube BID   DULoxetine  60 mg Oral Daily   folic acid  1 mg Oral Daily   gabapentin  600 mg Oral QID   ipratropium-albuterol  3 mL Nebulization Q4H   levalbuterol       LORazepam  0-4 mg  Intravenous Q12H   multivitamin with minerals  1 tablet Oral Daily   pantoprazole  40 mg Oral Daily   polyethylene glycol  17 g Per Tube Daily   sodium chloride flush  3 mL Intravenous Q12H   thiamine  100 mg Oral Daily   Warfarin - Pharmacist Dosing Inpatient   Does not apply q1600   PRN Medications: acetaminophen **OR** [DISCONTINUED] acetaminophen, baclofen, bisacodyl, fentaNYL, hydrALAZINE, levalbuterol, LORazepam **OR** LORazepam, nicotine, ondansetron **OR** ondansetron (ZOFRAN) IV, polyethylene glycol, traMADol Hemodynamic parameters:   Intake/Output: 09/06 0701 - 09/07 0700 In: 2358.6 [I.V.:2358.6] Out: 450 [Urine:450]  Ventilator  Settings: Vent Mode: PRVC FiO2 (%):  [50 %] 50 % Set Rate:  [15 bmp] 15 bmp Vt Set:  [450 mL] 450 mL PEEP:  [5 cmH20] 5 cmH20 Plateau Pressure:  [24 cmH20] 24 cmH20    LAB RESULTS:  Basic Metabolic Panel: Recent Labs  Lab 04/28/2021 1208 04/17/21 0429 04/18/21 0404 04/19/21 0234  NA 128* 131* 132* 132*  K 2.4* 2.9* 4.2 4.1  CL 81* 88* 95* 98  CO2 34* 37* 31 27  GLUCOSE 100* 92 81 84  BUN <5* 5* 6 8  CREATININE 0.63 0.65 0.63 0.60  CALCIUM 7.7* 7.4* 7.4* 7.4*  MG 1.8 2.2 1.9 1.9  PHOS  --  3.5 2.3* 2.8   Liver Function Tests: Recent Labs  Lab 05/04/2021 1208 04/17/21 0429 04/18/21 0404  AST 33 28 28  ALT '20 15 17  ' ALKPHOS 139* 118 126  BILITOT 0.8 0.9 0.8  PROT 6.3* 5.4* 5.6*  ALBUMIN 2.0* 1.7* 1.9*   No results for input(s): LIPASE, AMYLASE in the last 168 hours. No results for input(s): AMMONIA in the last 168 hours. CBC: Recent Labs  Lab 04/14/2021 1208 04/17/21 0429 04/18/21 0404 04/19/21 1023  WBC 9.2 7.3 8.6 12.9*  HGB 11.4* 10.2* 10.7* 10.1*  HCT 32.2* 28.7* 31.9* 29.0*  MCV 92.8 92.9 95.5 94.5  PLT 296 248 286 330   Cardiac Enzymes: No results for input(s): CKTOTAL, CKMB, CKMBINDEX, TROPONINI in the last 168 hours. BNP: Invalid input(s): POCBNP CBG: Recent Labs  Lab 04/19/21 1057  GLUCAP 83        IMAGING RESULTS:  Imaging: DG Chest 2 View  Result Date: 04/19/2021 CLINICAL DATA:  Respiratory distress EXAM: CHEST - 2 VIEW COMPARISON:  07/30/2016 FINDINGS: Cardiomegaly  with implantable loop recorder. Moderate to large right pleural effusion, small, layering left pleural effusion and associated atelectasis or consolidation. Diffuse bilateral heterogeneous and interstitial airspace opacity. The osseous structures are unremarkable. IMPRESSION: 1. Moderate to large right pleural effusion, small, layering left pleural effusion and associated atelectasis or consolidation. 2. Diffuse bilateral heterogeneous and interstitial airspace opacity, consistent with edema and/or infection. 3. Cardiomegaly. Electronically Signed   By: Eddie Candle M.D.   On: 04/19/2021 11:54   '@PROBHOSP' @ DG Chest 2 View  Result Date: 04/19/2021 CLINICAL DATA:  Respiratory distress EXAM: CHEST - 2 VIEW COMPARISON:  07/30/2016 FINDINGS: Cardiomegaly with implantable loop recorder. Moderate to large right pleural effusion, small, layering left pleural effusion and associated atelectasis or consolidation. Diffuse bilateral heterogeneous and interstitial airspace opacity. The osseous structures are unremarkable. IMPRESSION: 1. Moderate to large right pleural effusion, small, layering left pleural effusion and associated atelectasis or consolidation. 2. Diffuse bilateral heterogeneous and interstitial airspace opacity, consistent with edema and/or infection. 3. Cardiomegaly. Electronically Signed   By: Eddie Candle M.D.   On: 04/19/2021 11:54     ASSESSMENT AND PLAN    -Multidisciplinary rounds held today  Acute Hypoxic Respiratory Failure -with bilateral pulmonary edema and effusions cannot rule out infectious multifocal pneumonia    - procalcitonin is elevated     - empiric vancomycin and zosyn -continue Bronchodilator Therapy -Wean Fio2 and PEEP as tolerated -will perform SAT/SBT when respiratory parameters are  met   Acute decompensated diastolic CHF EF >41% -oxygen as needed -Lasix as tolerated -TTE -midodrine to increase BP and allow for diuresis ICU monitoring  Hypervolemic hyponatremia -likely due to CHF -follow UO -continue Foley Catheter-assess need daily -pharmacy consultation  NEUROLOGY - intubated and sedated - minimal sedation to achieve a RASS goal: -1 Wake up assessment pending   ID -continue IV abx as prescibed -follow up cultures  GI/Nutrition GI PROPHYLAXIS as indicated DIET-->TF's as tolerated Constipation protocol as indicated  ENDO - ICU hypoglycemic\Hyperglycemia protocol -check FSBS per protocol   ELECTROLYTES -follow labs as needed -replace as needed -pharmacy consultation   DVT/GI PRX ordered -SCDs  TRANSFUSIONS AS NEEDED MONITOR FSBS ASSESS the need for LABS as needed   Critical care provider statement:    Critical care time (minutes):  109   Critical care time was exclusive of:  Separately billable procedures and treating other patients   Critical care was necessary to treat or prevent imminent or life-threatening deterioration of the following conditions:  Acute CHF exacerbation vs pneumonia, acutely comatose, Acute hypoxemic respiratory failure.    Critical care was time spent personally by me on the following activities:  Development of treatment plan with patient or surrogate, discussions with consultants, evaluation of patient's response to treatment, examination of patient, obtaining history from patient or surrogate, ordering and performing treatments and interventions, ordering and review of laboratory studies and re-evaluation of patient's condition.  I assumed direction of critical care for this patient from another provider in my specialty: no    This document was prepared using Dragon voice recognition software and may include unintentional dictation errors.    Ottie Glazier, M.D.  Division of [place]

## 2021-04-19 NOTE — Procedures (Signed)
Endotracheal Intubation: Patient required placement of an artificial airway secondary to Respiratory Failure  Consent: Emergent.   Hand washing performed prior to starting the procedure.   Medications administered for sedation prior to procedure:  Etomidate 20 mg Rocuronium 50 mcg Fentanyl 100 mcg IV.    A time out procedure was called and correct patient, name, & ID confirmed. Needed supplies and equipment were assembled and checked to include ETT, 10 ml syringe, Glidescope, Mac and Miller blades, suction, oxygen and bag mask valve, end tidal CO2 monitor.   Patient was positioned to align the mouth and pharynx to facilitate visualization of the glottis.   Heart rate, SpO2 and blood pressure was continuously monitored during the procedure. Pre-oxygenation was conducted prior to intubation and endotracheal tube was placed through the vocal cords into the trachea.     The artificial airway was placed under direct visualization via glidescope route using a 8.0 ETT on the first attempt.  ETT was secured at 25 cm mark.  Placement was confirmed by auscuitation of lungs with good breath sounds bilaterally and no stomach sounds.  Condensation was noted on endotracheal tube.   Pulse ox 98%.  CO2 detector in place with appropriate color change.   Complications: None .    Chest radiograph ordered and pending.   Comments: OGT placed via glidescope.    Ottie Glazier, M.D.  Pulmonary & [place]

## 2021-04-19 NOTE — Progress Notes (Signed)
[facility] for warfarin Indication:  hyperhomocystinemia  w/ h/o CVA  Patient Measurements: Height: '5\' 2"'$  (157.5 cm) Weight: 73.9 kg (163 lb) IBW/kg (Calculated) : 50.1 Heparin dosing weight: 66 kg   Labs: Recent Labs    04/17/21 0429 04/18/21 0404 04/19/21 0234 04/19/21 1023 04/19/21 1510  HGB 10.2* 10.7*  --  10.1*  --   HCT 28.7* 31.9*  --  29.0*  --   PLT 248 286  --  330  --   LABPROT 50.6* 47.2* 33.0*  --  28.9*  INR 5.6* 5.1* 3.2*  --  2.7*  CREATININE 0.65 0.63 0.60  --   --      Estimated Creatinine Clearance: 73 mL/min (by C-G formula based on SCr of 0.6 mg/dL).   Medical History: Past Medical History:  Diagnosis Date   Anxiety    Breast cancer ([place]) 01/2014   Invasive lobular carcinoma, 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 ER/ PR+; Her 2 neu not overexpressed, microscopic positive margin (skin). Oncotype DX, low risk for recurrence.   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 30 tablet 2 unk at unk   B-Complex TABS Take 1 tablet by mouth daily.   unk at unk   baclofen (LIORESAL) 10 MG tablet Take 10 mg by mouth 3 (three) times daily as needed for muscle spasms.    unk at unk   buPROPion (WELLBUTRIN XL) 150 MG 24 hr tablet Take 150 mg by mouth at bedtime.   unk at unk   clonazePAM (KLONOPIN) 0.5 MG tablet Take 1 tablet (0.5 mg total) by mouth 2 (two) times daily as needed for anxiety. 4 tablet 0 unk at unk   DULoxetine (CYMBALTA) 60 MG capsule Take 60 mg by mouth at bedtime.   unk at unk   gabapentin (NEURONTIN) 600 MG tablet Take 600 mg by mouth 4 (four) times daily.   unk at unk    levocetirizine (XYZAL) 5 MG tablet Take 5 mg by mouth daily. In the afternoon   unk at unk   metolazone (ZAROXOLYN) 5 MG tablet Take 5 mg by mouth daily as needed.   unk at unk   metoprolol succinate (TOPROL-XL) 25 MG 24 hr tablet Take 25 mg by mouth daily.   unk at unk   omeprazole (PRILOSEC) 20 MG capsule Take 20 mg by mouth daily.   unk at unk   potassium chloride (K-DUR) 10 MEQ tablet Take 10 mEq by mouth 2 (two) times daily.   unk at unk   torsemide (DEMADEX) 20 MG tablet Take 60 mg by mouth daily.   unk at unk   warfarin (COUMADIN) 5 MG tablet Take 5 mg by mouth at bedtime.   unk at unk   cyanocobalamin 1000 MCG tablet Take 1 tablet (1,000 mcg total) by mouth daily. (Patient not taking: Reported on 05/08/2021) 30 tablet 2 Not Taking   Scheduled:   atorvastatin  40 mg Per Tube q1800   budesonide (PULMICORT) nebulizer solution  0.5 mg Nebulization BID   buPROPion  150 mg Oral Daily   Chlorhexidine Gluconate Cloth  6  each Topical Daily   docusate  100 mg Per Tube BID   DULoxetine  60 mg Oral Daily   [START ON 99991111 folic acid  1 mg Per Tube Daily   furosemide  20 mg Intravenous BID   gabapentin  600 mg Per Tube QID   ipratropium-albuterol  3 mL Nebulization Q4H   levalbuterol       LORazepam  0-4 mg Intravenous Q12H   midodrine  10 mg Per Tube TID WC   [START ON 04/20/2021] multivitamin with minerals  1 tablet Per Tube Daily   pantoprazole sodium  40 mg Per Tube Daily   polyethylene glycol  17 g Per Tube Daily   sodium chloride flush  3 mL Intravenous Q12H   [START ON 04/20/2021] thiamine  100 mg Per Tube Daily   Warfarin - Pharmacist Dosing Inpatient   Does not apply q1600   Infusions:   sodium chloride 250 mL (04/19/21 1318)   albumin human Stopped (04/19/21 1416)   ceFEPime (MAXIPIME) IV Stopped (04/19/21 1408)   fentaNYL infusion INTRAVENOUS 100 mcg/hr (04/19/21 1511)   norepinephrine (LEVOPHED) Adult infusion Stopped (04/19/21 1322)   propofol (DIPRIVAN) infusion 20  mcg/kg/min (04/19/21 1511)   [START ON 04/20/2021] vancomycin     vancomycin 175 mL/hr at 04/19/21 1511    PRN: acetaminophen **OR** [DISCONTINUED] acetaminophen, baclofen, bisacodyl, fentaNYL, hydrALAZINE, levalbuterol, nicotine, ondansetron **OR** ondansetron (ZOFRAN) IV, polyethylene glycol, traMADol Anti-infectives (From admission, onward)    Start     Dose/Rate Route Frequency Ordered Stop   04/20/21 1500  vancomycin (VANCOREADY) IVPB 1500 mg/300 mL        1,500 mg 150 mL/hr over 120 Minutes Intravenous Every 24 hours 04/19/21 1316     04/19/21 1415  vancomycin (VANCOREADY) IVPB 1750 mg/350 mL        1,750 mg 175 mL/hr over 120 Minutes Intravenous  Once 04/19/21 1316     04/19/21 1415  ceFEPIme (MAXIPIME) 2 g in sodium chloride 0.9 % 100 mL IVPB        2 g 200 mL/hr over 30 Minutes Intravenous Every 8 hours 04/19/21 1316         Assessment: 55 YO Female admitted with supratherapeutic INR on 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 Patient has PMH of alcohol dependence & elevation of INR suspected from excessive intake.   9/4-5: INR 10.4>5.6 after Vitamin K 2.5 mg PO x 1, though patient is without gross bleeding other than bleeding from sacral skin tag.  9/6: INR 5.1; continue to hold. 9/7: INR 3.2; Delta of 1.9 after 3d of holding doses. Will check afternoon INR. 9/7 INR 2.7 (PM)  9/7: Original plan was for VKA restart, but changed d/t pt respiratory decompensation req'ing CCU transfer and ultimately intubation. Discussed with PCCM NP about restarting warfarin per OG tube. Would like to hold off given patient instability and possible procedures. Consult for IV heparin placed.  Goal of Therapy:  INR 2-3 Heparin level 0.3-0.7 units/ml Monitor platelets by anticoagulation protocol: Yes   Plan:  Plan to start IV heparin once INR < 2. Daily CBC, INR. Continue to monitor for ability to restart on warfarin.   Wynelle Cleveland, PharmD Pharmacy  Resident  04/19/2021 4:24 PM

## 2021-04-19 NOTE — Progress Notes (Signed)
PT Cancellation Note  Patient Details Name: Tina Sharp MRN: [id number] DOB: [birth year]   Cancelled Treatment:    Reason Eval/Treat Not Completed: Other (comment) Pt transferred to higher level of care due to change in medical status. PT to sign off. Please reconsult when pt is medically appropriate    [employer], SPT

## 2021-04-20 ENCOUNTER — Inpatient Hospital Stay: Payer: Medicare HMO

## 2021-04-20 DIAGNOSIS — J9690 Respiratory failure, unspecified, unspecified whether with hypoxia or hypercapnia: Secondary | ICD-10-CM | POA: Diagnosis not present

## 2021-04-20 DIAGNOSIS — R0902 Hypoxemia: Secondary | ICD-10-CM | POA: Diagnosis not present

## 2021-04-20 DIAGNOSIS — J90 Pleural effusion, not elsewhere classified: Secondary | ICD-10-CM | POA: Diagnosis not present

## 2021-04-20 DIAGNOSIS — L8990 Pressure ulcer of unspecified site, unspecified stage: Secondary | ICD-10-CM | POA: Insufficient documentation

## 2021-04-20 DIAGNOSIS — I517 Cardiomegaly: Secondary | ICD-10-CM | POA: Diagnosis not present

## 2021-04-20 LAB — CBC
HCT: 30.5 % — ABNORMAL LOW (ref 36.0–46.0)
Hemoglobin: 10 g/dL — ABNORMAL LOW (ref 12.0–15.0)
MCH: 31.5 pg (ref 26.0–34.0)
MCHC: 32.8 g/dL (ref 30.0–36.0)
MCV: 96.2 fL (ref 80.0–100.0)
Platelets: 319 10*3/uL (ref 150–400)
RBC: 3.17 MIL/uL — ABNORMAL LOW (ref 3.87–5.11)
RDW: 15.3 % (ref 11.5–15.5)
WBC: 15.4 10*3/uL — ABNORMAL HIGH (ref 4.0–10.5)
nRBC: 0 % (ref 0.0–0.2)

## 2021-04-20 LAB — GLUCOSE, CAPILLARY
Glucose-Capillary: 148 mg/dL — ABNORMAL HIGH (ref 70–99)
Glucose-Capillary: 148 mg/dL — ABNORMAL HIGH (ref 70–99)
Glucose-Capillary: 100 mg/dL — ABNORMAL HIGH (ref 70–99)
Glucose-Capillary: 90 mg/dL (ref 70–99)
Glucose-Capillary: 100 mg/dL — ABNORMAL HIGH (ref 70–99)
Glucose-Capillary: 95 mg/dL (ref 70–99)
Glucose-Capillary: 95 mg/dL (ref 70–99)

## 2021-04-20 LAB — URINE CULTURE: Culture: NO GROWTH

## 2021-04-20 LAB — PROTIME-INR
INR: 3.3 — ABNORMAL HIGH (ref 0.8–1.2)
Prothrombin Time: 33.3 seconds — ABNORMAL HIGH (ref 11.4–15.2)

## 2021-04-20 LAB — COMPREHENSIVE METABOLIC PANEL
ALT: 14 U/L (ref 0–44)
AST: 26 U/L (ref 15–41)
Albumin: 2 g/dL — ABNORMAL LOW (ref 3.5–5.0)
Alkaline Phosphatase: 89 U/L (ref 38–126)
Anion gap: 9 (ref 5–15)
BUN: 12 mg/dL (ref 6–20)
CO2: 26 mmol/L (ref 22–32)
Calcium: 7.6 mg/dL — ABNORMAL LOW (ref 8.9–10.3)
Chloride: 98 mmol/L (ref 98–111)
Creatinine, Ser: 0.6 mg/dL (ref 0.44–1.00)
GFR, Estimated: >60 mL/min (ref >60)
Glucose, Bld: 134 mg/dL — ABNORMAL HIGH (ref 70–99)
Potassium: 3.7 mmol/L (ref 3.5–5.1)
Sodium: 133 mmol/L — ABNORMAL LOW (ref 135–145)
Total Bilirubin: 0.8 mg/dL (ref 0.3–1.2)
Total Protein: 5.7 g/dL — ABNORMAL LOW (ref 6.5–8.1)

## 2021-04-20 LAB — ECHOCARDIOGRAM COMPLETE
Area-P 1/2: 2.83 cm2
Height: 62 in
S' Lateral: 2.6 cm
Weight: 2608 oz

## 2021-04-20 LAB — RESPIRATORY PANEL BY PCR

## 2021-04-20 LAB — LEGIONELLA PNEUMOPHILA SEROGP 1 UR AG: L. pneumophila Serogp 1 Ur Ag: NEGATIVE

## 2021-04-20 LAB — PROCALCITONIN: Procalcitonin: 1.63 ng/mL

## 2021-04-20 LAB — TRIGLYCERIDES: Triglycerides: 53 mg/dL (ref <150)

## 2021-04-20 MED ORDER — THIAMINE HCL 100 MG/ML IJ SOLN
500.0000 mg | Freq: Three times a day (TID) | INTRAVENOUS | Status: DC
  Administered 2021-04-20 – 2021-04-22 (×7): 500 mg via INTRAVENOUS
  Filled 2021-04-20 (×9): qty 5

## 2021-04-20 MED ORDER — THIAMINE HCL 100 MG PO TABS: 100.0000 mg | ORAL_TABLET | Freq: Every day | ORAL | Status: DC

## 2021-04-20 NOTE — Progress Notes (Signed)
Levo gtt stopped @ start of shift.   In afternoon, pt increasingly awake, following commands, tachypneic, displaying intermittent bouts of generalized shaking. Dr. Loni Muse notified and aware. Prop rate increased and fent boluses infused.  Attempted to wean FIO2 down to 40% w/o success. O2 sat in low 80s.   Fio2 increased to 50%.   Later in evening near 1900, pt's sat's began dropping into upper 80's. Fio2 increased to 70%. Sat's remain in low 90s.   Foley in place. Lasix given x2. Pt put out 820 ml urine for me.   Bath completed @ 1200.   Levo had to be restarted in evening, dose @ 1.   Turn q2.   Daughter updated via phone today and @ bedside.

## 2021-04-20 NOTE — TOC Progression Note (Signed)
Transition of Care [facility]) - Progression Note    Patient Details  Name: Tina Sharp MRN: [id number] Date of Birth: 10/09/1965  Transition of Care [facility]) CM/SW [place], RN Phone Number: 04/20/2021, 12:02 PM  Clinical Narrative:  Patient intubated, spike with daughters at bedside, Jinny Blossom and [place] about discharge recommendation for SNF. They both agree and prefer [facility], says patient was there years ago.    Expected Discharge Plan: Home/Self Care Barriers to Discharge: Continued Medical Work up  Expected Discharge Plan and Services Expected Discharge Plan: Home/Self Care       Living arrangements for the past 2 months: Single Family Home                                       Social Determinants of Health (SDOH) Interventions    Readmission Risk Interventions Readmission Risk Prevention Plan 04/17/2021  Transportation Screening Complete  PCP or Specialist Appt within 3-5 Days Complete  HRI or [place] Complete  Social Work Consult for [place] Planning/Counseling Complete  Palliative Care Screening Not Applicable  Medication Review [employer]) Complete  Some recent data might be hidden

## 2021-04-20 NOTE — Progress Notes (Signed)
CRITICAL CARE PROGRESS NOTE    Name: Tina Sharp MRN: [id number] DOB: [birth year]     LOS: 4   SUBJECTIVE FINDINGS & SIGNIFICANT EVENTS    Patient description:  55 yo F arrived to MICU from medical floor with acute respiratory distress and hypoxemia in unresponsive state. PMH includes multiple strokes and CHF with progression per family at bedside.  I was able to speak with 3 family members who share that patient last week had letter from her doctor to skip jury duty due to her worsening heart failure.  She had CXR done with bilateral pleural effusions and pulmonary edema on arrival.   04/20/21- patient improved with less infiltrates on CXR. Diuresed over 1L in past 24h. Weaning FiO2 on ventinlator in preparation for SBT  Lines/tubes : Airway 7.5 mm (Active)  Secured at (cm) 25 cm 04/19/21 1127  Measured From Lips 04/19/21 [place] 04/19/21 1127  Secured By [employer] 04/19/21 1127  Cuff Pressure (cm H2O) Green OR 18-26 CmH2O 04/19/21 1127     Closed System Drain 1 Right Hip Accordion (Hemovac) (Active)     Urethral Catheter GINA RN (Active)    Microbiology/Sepsis markers: Results for orders placed or performed during the hospital encounter of 05/05/2021  Resp Panel by RT-PCR (Flu A&B, Covid) Nasopharyngeal Swab     Status: None   Collection Time: 04/21/2021  2:49 PM   Specimen: Nasopharyngeal Swab; Nasopharyngeal(NP) swabs in vial transport medium  Result Value Ref Range Status   SARS Coronavirus 2 by RT PCR NEGATIVE NEGATIVE Final    Comment: (NOTE) SARS-CoV-2 target nucleic acids are NOT DETECTED.  The SARS-CoV-2 RNA is generally detectable in upper respiratory specimens during the acute phase of infection. The lowest concentration of SARS-CoV-2 viral copies this assay  can detect is 138 copies/mL. A negative result does not preclude SARS-Cov-2 infection and should not be used as the sole basis for treatment or other patient management decisions. A negative result may occur with  improper specimen collection/handling, submission of specimen other than nasopharyngeal swab, presence of viral mutation(s) within the areas targeted by this assay, and inadequate number of viral copies(<138 copies/mL). A negative result must be combined with clinical observations, patient history, and epidemiological information. The expected result is Negative.  Fact Sheet for Patients:  EntrepreneurPulse.com.au  Fact Sheet for Healthcare Providers:  IncredibleEmployment.be  This test is no t yet approved or cleared by the [place] FDA and  has been authorized for detection and/or diagnosis of SARS-CoV-2 by FDA under an Emergency Use Authorization (EUA). This EUA will remain  in effect (meaning this test can be used) for the duration of the COVID-19 declaration under Section 564(b)(1) of the Act, 21 U.S.C.section 360bbb-3(b)(1), unless the authorization is terminated  or revoked sooner.       Influenza A by PCR NEGATIVE NEGATIVE Final   Influenza B by PCR NEGATIVE NEGATIVE Final    Comment: (NOTE) The Xpert Xpress SARS-CoV-2/FLU/RSV plus assay is intended as an aid in the diagnosis of influenza from Nasopharyngeal swab specimens and should not be used as a sole basis for treatment. Nasal washings and aspirates are unacceptable for Xpert Xpress SARS-CoV-2/FLU/RSV testing.  Fact Sheet for Patients: EntrepreneurPulse.com.au  Fact Sheet for Healthcare Providers: IncredibleEmployment.be  This test is not yet approved or cleared by the [place] FDA and has been authorized for detection and/or diagnosis of SARS-CoV-2 by FDA under an Emergency Use Authorization (EUA). This EUA will  remain in  effect (meaning this test can be used) for the duration of the COVID-19 declaration under Section 564(b)(1) of the Act, 21 U.S.C. section 360bbb-3(b)(1), unless the authorization is terminated or revoked.  Performed at [facility], [place], [place] [address]   Respiratory (~20 pathogens) panel by PCR     Status: None   Collection Time: 04/19/21 10:14 AM   Specimen: Nasopharyngeal Swab; Respiratory  Result Value Ref Range Status   Adenovirus NOT DETECTED NOT DETECTED Final   Coronavirus 229E NOT DETECTED NOT DETECTED Final    Comment: (NOTE) The Coronavirus on the Respiratory Panel, DOES NOT test for the novel  Coronavirus (2019 nCoV)    Coronavirus HKU1 NOT DETECTED NOT DETECTED Final   Coronavirus NL63 NOT DETECTED NOT DETECTED Final   Coronavirus OC43 NOT DETECTED NOT DETECTED Final   Metapneumovirus NOT DETECTED NOT DETECTED Final   Rhinovirus / Enterovirus NOT DETECTED NOT DETECTED Final   Influenza A NOT DETECTED NOT DETECTED Final   Influenza B NOT DETECTED NOT DETECTED Final   Parainfluenza Virus 1 NOT DETECTED NOT DETECTED Final   Parainfluenza Virus 2 NOT DETECTED NOT DETECTED Final   Parainfluenza Virus 3 NOT DETECTED NOT DETECTED Final   Parainfluenza Virus 4 NOT DETECTED NOT DETECTED Final   Respiratory Syncytial Virus NOT DETECTED NOT DETECTED Final   Bordetella pertussis NOT DETECTED NOT DETECTED Final   Bordetella Parapertussis NOT DETECTED NOT DETECTED Final   Chlamydophila pneumoniae NOT DETECTED NOT DETECTED Final   Mycoplasma pneumoniae NOT DETECTED NOT DETECTED Final    Comment: Performed at [facility], [place]. [address] [place], [place], [place] [address]  CULTURE, BLOOD (ROUTINE X 2) w Reflex to ID Panel     Status: None (Preliminary result)   Collection Time: 04/19/21 10:22 AM   Specimen: BLOOD  Result Value Ref Range Status   Specimen Description BLOOD BLOOD RIGHT HAND  Final   Special Requests   Final    BOTTLES  DRAWN AEROBIC AND ANAEROBIC Blood Culture adequate volume   Culture   Final    NO GROWTH < 24 HOURS Performed at [facility], [address] [place], [place] [address]    Report Status PENDING  Incomplete  CULTURE, BLOOD (ROUTINE X 2) w Reflex to ID Panel     Status: None (Preliminary result)   Collection Time: 04/19/21 10:34 AM   Specimen: BLOOD  Result Value Ref Range Status   Specimen Description BLOOD BLOOD RIGHT FOREARM  Final   Special Requests   Final    BOTTLES DRAWN AEROBIC AND ANAEROBIC Blood Culture adequate volume   Culture   Final    NO GROWTH < 24 HOURS Performed at [facility], [address] [place], [place] [address]    Report Status PENDING  Incomplete  MRSA Next Gen by PCR, Nasal     Status: None   Collection Time: 04/19/21 11:44 AM   Specimen: Nasal Mucosa; Nasal Swab  Result Value Ref Range Status   MRSA by PCR Next Gen NOT DETECTED NOT DETECTED Final    Comment: (NOTE) The GeneXpert MRSA Assay (FDA approved for NASAL specimens only), is one component of a comprehensive MRSA colonization surveillance program. It is not intended to diagnose MRSA infection nor to guide or monitor treatment for MRSA infections. Test performance is not FDA approved in patients less than 51 years old. Performed at [facility], [address], [place], [place] [address]   Culture, Respiratory w Gram Stain  Status: None (Preliminary result)   Collection Time: 04/19/21 10:49 PM   Specimen: Tracheal Aspirate  Result Value Ref Range Status   Specimen Description   Final    TRACHEAL ASPIRATE Performed at [facility], [address] [place], [place] [address]    Special Requests   Final    NONE Performed at [facility], [place], [place] [address]    Gram Stain   Final    MODERATE SQUAMOUS EPITHELIAL CELLS PRESENT MODERATE WBC SEEN NO ORGANISMS SEEN Performed at [facility], [place] [address], [place], [place] [address]    Culture PENDING  Incomplete   Report Status PENDING  Incomplete    Anti-infectives:  Anti-infectives (From admission, onward)    Start     Dose/Rate Route Frequency Ordered Stop   04/20/21 1500  vancomycin (VANCOREADY) IVPB 1500 mg/300 mL        1,500 mg 150 mL/hr over 120 Minutes Intravenous Every 24 hours 04/19/21 1316     04/19/21 1415  vancomycin (VANCOREADY) IVPB 1750 mg/350 mL        1,750 mg 175 mL/hr over 120 Minutes Intravenous  Once 04/19/21 1316 04/19/21 1708   04/19/21 1415  ceFEPIme (MAXIPIME) 2 g in sodium chloride 0.9 % 100 mL IVPB        2 g 200 mL/hr over 30 Minutes Intravenous Every 8 hours 04/19/21 1316           PAST MEDICAL HISTORY   Past Medical History:  Diagnosis Date   Anxiety    Breast cancer ([place]) 01/2014   Invasive lobular carcinoma, 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 ER/ PR+; Her 2 neu not overexpressed, microscopic positive margin (skin). Oncotype DX, low risk for recurrence.   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 Negative ileal and colon biopsies.    EP IMPLANTABLE DEVICE N/A 06/14/2015   Procedure: Loop Recorder Insertion;  Surgeon: Evans Lance, MD;  Location: [facility];  Service: Cardiovascular;  Laterality: N/A;   HIP ARTHROPLASTY Right 08/01/2016   Procedure: ARTHROPLASTY BIPOLAR HIP (HEMIARTHROPLASTY);  Surgeon: Dereck Leep, MD;  Location: [facility];  Service: Orthopedics;  Laterality: Right;   KYPHOPLASTY N/A 09/16/2018   Procedure: KYPHOPLASTY T12;  Surgeon: Hessie Knows, MD;  Location: [facility];   Service: Orthopedics;  Laterality: N/A;   LAPAROSCOPIC HYSTERECTOMY  2010   [facility]   MASTECTOMY Left 2016   REDUCTION MAMMAPLASTY Right 2016   Lift   reduction mammoplasty Right 04/08/14   Dr Tula Nakayama   TEE WITHOUT CARDIOVERSION N/A 06/14/2015   Procedure: TRANSESOPHAGEAL ECHOCARDIOGRAM (TEE);  Surgeon: Josue Hector, MD;  Location: [facility];  Service: Cardiovascular;  Laterality: N/A;   TONSILLECTOMY AND ADENOIDECTOMY     pt was 55 years old     FAMILY HISTORY   Family History  Problem Relation Age of Onset   Breast cancer Mother 36   Ovarian cancer Other    Stroke Paternal Grandmother    Breast cancer Maternal Grandmother 5  Cancer Maternal Grandfather 56       prostate   Prostate cancer Maternal Grandfather      SOCIAL HISTORY   Social History   Tobacco Use   Smoking status: Every Day    Packs/day: 0.25    Types: Cigarettes   Smokeless tobacco: Never   Tobacco comments:    E cig  Vaping Use   Vaping Use: Never used  Substance Use Topics   Alcohol use: Not Currently    Comment: 1-2 bottles per day   Drug use: No     MEDICATIONS   Current Medication:  Current Facility-Administered Medications:    0.9 %  sodium chloride infusion, 250 mL, Intravenous, Continuous, Dorothe Pea, RPH, Last Rate: 10 mL/hr at 04/19/21 1318, 250 mL at 04/19/21 1318   acetaminophen (TYLENOL) tablet 500 mg, 500 mg, Oral, Q6H PRN, 500 mg at 04/19/21 1017 **OR** [DISCONTINUED] acetaminophen (TYLENOL) suppository 650 mg, 650 mg, Rectal, Q6H PRN, Karmen Bongo, MD   albumin human 25 % solution 12.5 g, 12.5 g, Intravenous, Daily, Nilan Iddings, MD, Last Rate: 60 mL/hr at 04/20/21 1004, 12.5 g at 04/20/21 1004   atorvastatin (LIPITOR) tablet 40 mg, 40 mg, Per Tube, q1800, Lorella Nimrod, MD, 40 mg at 04/19/21 1730   baclofen (LIORESAL) tablet 10 mg, 10 mg, Oral, TID PRN, Karmen Bongo, MD   bisacodyl (DULCOLAX) EC tablet 5 mg, 5 mg, Oral, Daily PRN, Karmen Bongo, MD    budesonide (PULMICORT) nebulizer solution 0.5 mg, 0.5 mg, Nebulization, BID, Darel Hong D, NP, 0.5 mg at 04/20/21 0740   buPROPion (WELLBUTRIN XL) 24 hr tablet 150 mg, 150 mg, Oral, Daily, Karmen Bongo, MD, 150 mg at 04/18/21 0842   ceFEPIme (MAXIPIME) 2 g in sodium chloride 0.9 % 100 mL IVPB, 2 g, Intravenous, Q8H, Lorella Nimrod, MD, Stopped at 04/20/21 0548   chlorhexidine gluconate (MEDLINE KIT) (PERIDEX) 0.12 % solution 15 mL, 15 mL, Mouth Rinse, BID, [place], Bing Neighbors, NP, 15 mL at 04/20/21 0827   Chlorhexidine Gluconate Cloth 2 % PADS 6 each, 6 each, Topical, Daily, Darel Hong D, NP, 6 each at 04/20/21 0958   docusate (COLACE) 50 MG/5ML liquid 100 mg, 100 mg, Per Tube, BID, Darel Hong D, NP, 100 mg at 04/20/21 1004   DULoxetine (CYMBALTA) DR capsule 60 mg, 60 mg, Oral, Daily, Karmen Bongo, MD, 60 mg at 04/18/21 0842   fentaNYL (SUBLIMAZE) bolus via infusion 50-100 mcg, 50-100 mcg, Intravenous, Q15 min PRN, Darel Hong D, NP   fentaNYL 2574mg in NS 2526m(1010mml) infusion-PREMIX, 50-200 mcg/hr, Intravenous, Continuous, KeeDarel Hong NP, Last Rate: 7.5 mL/hr at 04/20/21 0729, 75 mcg/hr at 05/01/49/9322671folic acid (FOLVITE) tablet 1 mg, 1 mg, Per Tube, Daily, AmiLorella NimrodD, 1 mg at 04/20/21 1002   furosemide (LASIX) injection 20 mg, 20 mg, Intravenous, BID, AleLanney Ginsuad, MD, 20 mg at 04/20/21 0827   gabapentin (NEURONTIN) tablet 600 mg, 600 mg, Per Tube, QID, AmiLorella NimrodD, 600 mg at 04/20/21 1003   hydrALAZINE (APRESOLINE) injection 5 mg, 5 mg, Intravenous, Q4H PRN, YatKarmen BongoD   ipratropium-albuterol (DUONEB) 0.5-2.5 (3) MG/3ML nebulizer solution 3 mL, 3 mL, Nebulization, Q4H, Amin, Sumayya, MD, 3 mL at 04/20/21 0740   levalbuterol (XOPENEX) nebulizer solution 1.25 mg, 1.25 mg, Nebulization, Q6H PRN, AmiLorella NimrodD, 1.25 mg at 04/19/21 1014   [EXPIRED] LORazepam (ATIVAN) injection 0-4 mg, 0-4 mg, Intravenous, Q6H, 2 mg at 04/18/21  0840 **FOLLOWED BY** LORazepam (ATIVAN) injection 0-4 mg,  0-4 mg, Intravenous, Q12H, Karmen Bongo, MD   MEDLINE mouth rinse, 15 mL, Mouth Rinse, 10 times per day, Lang Snow, NP, 15 mL at 04/20/21 0958   midodrine (PROAMATINE) tablet 10 mg, 10 mg, Per Tube, TID WC, Lorella Nimrod, MD, 10 mg at 04/20/21 0827   multivitamin with minerals tablet 1 tablet, 1 tablet, Per Tube, Daily, Lorella Nimrod, MD, 1 tablet at 04/20/21 1002   nicotine (NICODERM CQ - dosed in mg/24 hr) patch 7 mg, 7 mg, Transdermal, Daily PRN, Karmen Bongo, MD   norepinephrine (LEVOPHED) 71m in 2525mpremix infusion, 2-10 mcg/min, Intravenous, Titrated, DoDorothe PeaRPH, Stopped at 04/20/21 0700   ondansetron (ZOFRAN) tablet 4 mg, 4 mg, Oral, Q6H PRN **OR** ondansetron (ZOFRAN) injection 4 mg, 4 mg, Intravenous, Q6H PRN, YaKarmen BongoMD   pantoprazole sodium (PROTONIX) 40 mg oral suspension 40 mg, 40 mg, Per Tube, Daily, Amin, SuSoundra PilonMD, 40 mg at 04/20/21 1003   polyethylene glycol (MIRALAX / GLYCOLAX) packet 17 g, 17 g, Oral, Daily PRN, YaKarmen BongoMD   polyethylene glycol (MIRALAX / GLYCOLAX) packet 17 g, 17 g, Per Tube, Daily, KeDarel Hong, NP, 17 g at 04/20/21 1003   propofol (DIPRIVAN) 1000 MG/100ML infusion, 0-50 mcg/kg/min, Intravenous, Continuous, KeDarel Hong, NP, Last Rate: 6.65 mL/hr at 04/20/21 0831, 15 mcg/kg/min at 04/20/21 0831   sodium chloride flush (NS) 0.9 % injection 3 mL, 3 mL, Intravenous, Q12H, YaKarmen BongoMD, 3 mL at 04/20/21 0957   thiamine 50054mn normal saline (71m80mVPB, 500 mg, Intravenous, Q8H, AlesOttie Glazier   [START ON 04/23/2021] thiamine tablet 100 mg, 100 mg, Per Tube, Daily **OR** [DISCONTINUED] thiamine (B-1) injection 100 mg, 100 mg, Intravenous, Daily, YateKarmen Bongo, 100 mg at 04/29/2021 1621   traMADol (ULTRAM) tablet 50 mg, 50 mg, Oral, Q6H PRN, McClCherene Altes, 50 mg at 04/17/21 2353   vancomycin (VANCOREADY) IVPB 1500 mg/300 mL,  1,500 mg, Intravenous, Q24H, AminLorella Nimrod    ALLERGIES   Patient has no known allergies.    REVIEW OF SYSTEMS     Patient is unresponsive unable to give ROS  PHYSICAL EXAMINATION   Vital Signs: Temp:  [97.5 F (36.4 C)-100.3 F (37.9 C)] 98.4 F (36.9 C) (09/08 0745) Pulse Rate:  [80-112] 96 (09/08 0945) Resp:  [8-23] 15 (09/08 0945) BP: (80-135)/(57-84) 105/72 (09/08 0945) SpO2:  [90 %-96 %] 93 % (09/08 0945) FiO2 (%):  [50 %] 50 % (09/08 0745)  GENERAL:Age appropriate on MV HEAD: Normocephalic, atraumatic.  EYES: Pupils equal, round, reactive to light.  No scleral icterus.  MOUTH: Moist mucosal membrane. NECK: Supple. No thyromegaly. No nodules. No JVD.  PULMONARY: rhonchi and crackles bilaterallhy  CARDIOVASCULAR: S1 and S2. Regular rate and rhythm. No murmurs, rubs, or gallops.  GASTROINTESTINAL: Soft, nontender, non-distended. No masses. Positive bowel sounds. No hepatosplenomegaly.  MUSCULOSKELETAL: No swelling, clubbing, or edema.  NEUROLOGIC: GCS4t SKIN:intact,warm,dry   PERTINENT DATA     Infusions:  sodium chloride 250 mL (04/19/21 1318)   albumin human 12.5 g (04/20/21 1004)   ceFEPime (MAXIPIME) IV Stopped (04/20/21 0548)   fentaNYL infusion INTRAVENOUS 75 mcg/hr (04/20/21 0729)   norepinephrine (LEVOPHED) Adult infusion Stopped (04/20/21 0700)   propofol (DIPRIVAN) infusion 15 mcg/kg/min (04/20/21 0831)   thiamine injection     vancomycin     Scheduled Medications:  atorvastatin  40 mg Per Tube q1800   budesonide (PULMICORT) nebulizer solution  0.5 mg Nebulization BID   buPROPion  150 mg Oral Daily   chlorhexidine gluconate (MEDLINE KIT)  15 mL Mouth Rinse BID   Chlorhexidine Gluconate Cloth  6 each Topical Daily   docusate  100 mg Per Tube BID   DULoxetine  60 mg Oral Daily   folic acid  1 mg Per Tube Daily   furosemide  20 mg Intravenous BID   gabapentin  600 mg Per Tube QID   ipratropium-albuterol  3 mL Nebulization Q4H    LORazepam  0-4 mg Intravenous Q12H   mouth rinse  15 mL Mouth Rinse 10 times per day   midodrine  10 mg Per Tube TID WC   multivitamin with minerals  1 tablet Per Tube Daily   pantoprazole sodium  40 mg Per Tube Daily   polyethylene glycol  17 g Per Tube Daily   sodium chloride flush  3 mL Intravenous Q12H   [START ON 04/23/2021] thiamine  100 mg Per Tube Daily   PRN Medications: acetaminophen **OR** [DISCONTINUED] acetaminophen, baclofen, bisacodyl, fentaNYL, hydrALAZINE, levalbuterol, nicotine, ondansetron **OR** ondansetron (ZOFRAN) IV, polyethylene glycol, traMADol Hemodynamic parameters:   Intake/Output: 09/07 0701 - 09/08 0700 In: 845 [I.V.:438.8; IV Piggyback:332.2] Out: 1575 [XMIWO:0321]  Ventilator  Settings: Vent Mode: PRVC FiO2 (%):  [50 %] 50 % Set Rate:  [15 bmp] 15 bmp Vt Set:  [450 mL] 450 mL PEEP:  [5 cmH20] 5 cmH20 Plateau Pressure:  [24 cmH20] 24 cmH20    LAB RESULTS:  Basic Metabolic Panel: Recent Labs  Lab 05/04/2021 1208 04/17/21 0429 04/18/21 0404 04/19/21 0234 04/20/21 0532  NA 128* 131* 132* 132* 133*  K 2.4* 2.9* 4.2 4.1 3.7  CL 81* 88* 95* 98 98  CO2 34* 37* '31 27 26  ' GLUCOSE 100* 92 81 84 134*  BUN <5* 5* '6 8 12  ' CREATININE 0.63 0.65 0.63 0.60 0.60  CALCIUM 7.7* 7.4* 7.4* 7.4* 7.6*  MG 1.8 2.2 1.9 1.9  --   PHOS  --  3.5 2.3* 2.8  --     Liver Function Tests: Recent Labs  Lab 05/05/2021 1208 04/17/21 0429 04/18/21 0404 04/20/21 0532  AST 33 '28 28 26  ' ALT '20 15 17 14  ' ALKPHOS 139* 118 126 89  BILITOT 0.8 0.9 0.8 0.8  PROT 6.3* 5.4* 5.6* 5.7*  ALBUMIN 2.0* 1.7* 1.9* 2.0*    No results for input(s): LIPASE, AMYLASE in the last 168 hours. No results for input(s): AMMONIA in the last 168 hours. CBC: Recent Labs  Lab 04/21/2021 1208 04/17/21 0429 04/18/21 0404 04/19/21 1023 04/20/21 0532  WBC 9.2 7.3 8.6 12.9* 15.4*  HGB 11.4* 10.2* 10.7* 10.1* 10.0*  HCT 32.2* 28.7* 31.9* 29.0* 30.5*  MCV 92.8 92.9 95.5 94.5 96.2  PLT 296  248 286 330 319    Cardiac Enzymes: No results for input(s): CKTOTAL, CKMB, CKMBINDEX, TROPONINI in the last 168 hours. BNP: Invalid input(s): POCBNP CBG: Recent Labs  Lab 04/19/21 1057 04/19/21 1907 04/19/21 2311 04/20/21 0316 04/20/21 0713  GLUCAP 83 135* 144* 148* 148*        IMAGING RESULTS:  Imaging: DG Chest 2 View  Result Date: 04/19/2021 CLINICAL DATA:  Respiratory distress EXAM: CHEST - 2 VIEW COMPARISON:  07/30/2016 FINDINGS: Cardiomegaly with implantable loop recorder. Moderate to large right pleural effusion, small, layering left pleural effusion and associated atelectasis or consolidation. Diffuse bilateral heterogeneous and interstitial airspace opacity. The osseous structures are unremarkable. IMPRESSION: 1. Moderate to large right pleural effusion, small, layering left pleural effusion and associated atelectasis or consolidation.  2. Diffuse bilateral heterogeneous and interstitial airspace opacity, consistent with edema and/or infection. 3. Cardiomegaly. Electronically Signed   By: Eddie Candle M.D.   On: 04/19/2021 11:54   DG Abd 1 View  Result Date: 04/19/2021 CLINICAL DATA:  Encounter for intubation. EXAM: ABDOMEN - 1 VIEW COMPARISON:  Radiographs earlier the same date and 07/30/2016. FINDINGS: 1150 hours. Interval intubation. Tip of the endotracheal tube is 1.9 cm above the carina. Enteric tube projects below the diaphragm, overlapping the distal stomach or proximal duodenum. Stable cardiomegaly. Loop recorder overlies the chest. The lung apices are partially excluded. There are bibasilar airspace opacities with interval slight worsening on the left. Probable bilateral pleural effusions superimposed on chronic right hemidiaphragm elevation. No evidence of pneumothorax. IMPRESSION: 1. Satisfactory position of the endotracheal and nasogastric tubes. 2. Worsening asymmetric left lung airspace opacities suspicious for pneumonia/aspiration. Electronically Signed   By:  Richardean Sale M.D.   On: 04/19/2021 12:30   DG Chest Port 1 View  Result Date: 04/20/2021 CLINICAL DATA:  Acute respiratory failure with hypoxia EXAM: PORTABLE CHEST - 1 VIEW COMPARISON:  the previous day's study FINDINGS: Endotracheal tube tip remains less than 2 cm above carina. Nasogastric tube extends to the decompressed stomach. Implanted monitoring device overlies the heart. Low lung volumes with diffuse airspace opacities predominantly in the lung bases, slightly improved on the left and increased on the right since previous exam. Probable small right pleural effusion.  No pneumothorax. Posttraumatic change in the proximal left humerus as before. Changes of vertebral cement augmentation near the thoracolumbar junction. IMPRESSION: Low volumes with persistent airspace opacities, worsening right and slightly improved left. Persistent low position of endotracheal tube tip, consider 2 cm retraction. Electronically Signed   By: Lucrezia Europe M.D.   On: 04/20/2021 08:03   Portable Chest x-ray  Result Date: 04/19/2021 CLINICAL DATA:  Intubation EXAM: PORTABLE CHEST 1 VIEW COMPARISON:  Chest x-ray dated April 19, 2021 FINDINGS: Interval intubation with ET tube approximately 1.5 cm from the carina. Enteric tube partially visualized coursing below the diaphragm. Similar left greater than right heterogeneous opacities and small bilateral pleural effusions. No evidence of pneumothorax. Visualized cardiac and mediastinal contours are unchanged. IMPRESSION: Interval intubation with ET tube approximately 1.5 cm from the carina. Electronically Signed   By: Yetta Glassman M.D.   On: 04/19/2021 12:27   '@PROBHOSP' @ DG Abd 1 View  Result Date: 04/19/2021 CLINICAL DATA:  Encounter for intubation. EXAM: ABDOMEN - 1 VIEW COMPARISON:  Radiographs earlier the same date and 07/30/2016. FINDINGS: 1150 hours. Interval intubation. Tip of the endotracheal tube is 1.9 cm above the carina. Enteric tube projects below the  diaphragm, overlapping the distal stomach or proximal duodenum. Stable cardiomegaly. Loop recorder overlies the chest. The lung apices are partially excluded. There are bibasilar airspace opacities with interval slight worsening on the left. Probable bilateral pleural effusions superimposed on chronic right hemidiaphragm elevation. No evidence of pneumothorax. IMPRESSION: 1. Satisfactory position of the endotracheal and nasogastric tubes. 2. Worsening asymmetric left lung airspace opacities suspicious for pneumonia/aspiration. Electronically Signed   By: Richardean Sale M.D.   On: 04/19/2021 12:30   DG Chest Port 1 View  Result Date: 04/20/2021 CLINICAL DATA:  Acute respiratory failure with hypoxia EXAM: PORTABLE CHEST - 1 VIEW COMPARISON:  the previous day's study FINDINGS: Endotracheal tube tip remains less than 2 cm above carina. Nasogastric tube extends to the decompressed stomach. Implanted monitoring device overlies the heart. Low lung volumes with diffuse airspace opacities predominantly in the  lung bases, slightly improved on the left and increased on the right since previous exam. Probable small right pleural effusion.  No pneumothorax. Posttraumatic change in the proximal left humerus as before. Changes of vertebral cement augmentation near the thoracolumbar junction. IMPRESSION: Low volumes with persistent airspace opacities, worsening right and slightly improved left. Persistent low position of endotracheal tube tip, consider 2 cm retraction. Electronically Signed   By: Lucrezia Europe M.D.   On: 04/20/2021 08:03   Portable Chest x-ray  Result Date: 04/19/2021 CLINICAL DATA:  Intubation EXAM: PORTABLE CHEST 1 VIEW COMPARISON:  Chest x-ray dated April 19, 2021 FINDINGS: Interval intubation with ET tube approximately 1.5 cm from the carina. Enteric tube partially visualized coursing below the diaphragm. Similar left greater than right heterogeneous opacities and small bilateral pleural effusions. No  evidence of pneumothorax. Visualized cardiac and mediastinal contours are unchanged. IMPRESSION: Interval intubation with ET tube approximately 1.5 cm from the carina. Electronically Signed   By: Yetta Glassman M.D.   On: 04/19/2021 12:27     ASSESSMENT AND PLAN    -Multidisciplinary rounds held today  Acute Hypoxic Respiratory Failure -with bilateral pulmonary edema and effusions cannot rule out infectious multifocal pneumonia    - procalcitonin is elevated     - empiric vancomycin and zosyn -continue Bronchodilator Therapy -Wean Fio2 and PEEP as tolerated -will perform SAT/SBT when respiratory parameters are met   Acute decompensated diastolic CHF EF >00% -oxygen as needed -Lasix as tolerated -TTE -midodrine to increase BP and allow for diuresis ICU monitoring  Hypervolemic hyponatremia -likely due to CHF -follow UO -continue Foley Catheter-assess need daily -pharmacy consultation  NEUROLOGY - intubated and sedated - minimal sedation to achieve a RASS goal: -1 Wake up assessment pending   ID -continue IV abx as prescibed -follow up cultures  GI/Nutrition GI PROPHYLAXIS as indicated DIET-->TF's as tolerated Constipation protocol as indicated  ENDO - ICU hypoglycemic\Hyperglycemia protocol -check FSBS per protocol   ELECTROLYTES -follow labs as needed -replace as needed -pharmacy consultation   DVT/GI PRX ordered -SCDs  TRANSFUSIONS AS NEEDED MONITOR FSBS ASSESS the need for LABS as needed   Critical care provider statement:    Critical care time (minutes):  33   Critical care time was exclusive of:  Separately billable procedures and treating other patients   Critical care was necessary to treat or prevent imminent or life-threatening deterioration of the following conditions:  Acute CHF exacerbation vs pneumonia, acutely comatose, Acute hypoxemic respiratory failure.    Critical care was time spent personally by me on the following activities:   Development of treatment plan with patient or surrogate, discussions with consultants, evaluation of patient's response to treatment, examination of patient, obtaining history from patient or surrogate, ordering and performing treatments and interventions, ordering and review of laboratory studies and re-evaluation of patient's condition.  I assumed direction of critical care for this patient from another provider in my specialty: no    This document was prepared using Dragon voice recognition software and may include unintentional dictation errors.    Ottie Glazier, M.D.  Division of [place]

## 2021-04-20 NOTE — Progress Notes (Addendum)
Nutrition Follow-up  DOCUMENTATION CODES:   Not applicable  INTERVENTION:   If pt remains intubated, recommend:  Vital 1.2 '@55ml'$ /hr- Initiate at 69m/hr and increase by 156mhr q 8 hours until goal rate is reached.   Pro-Source [id number] via tube, provides 40kcal and 11g of protein per serving   Free water flushes 8m44m hours to maintain tube patency   Regimen provides 1624kcal/day, 110g/day protein and 1250ml56m free water   Pt at high refeed risk; recommend monitor potassium, magnesium and phosphorus labs daily until stable  Vitamin C '500mg'$  BID via tube   NUTRITION DIAGNOSIS:   Inadequate oral intake related to inability to eat (pt sedated and ventilated) as evidenced by NPO status. 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  Pt sedated and ventilated. OGT in place. Plan is for possible SBTs today and tube feeds if no extubation. Pt with poor appetite and oral intake prior to intubation. Pt eating only sips and bites of meals in hospital. Pt is at high risk for refeeding. Pt receiving high dose IV thiamine. No new weight since admit; will request daily weights as pt is diuresing.   Medications reviewed and include: colace, folic acid, lasix, MVI, protonix, miralax, thiamine, albumin, cefepime, propofol, vancomycin   Labs reviewed: Na 133(L), K 3.7 wnl BNP- 1432.6(H)- 9/7 Wbc- 15.4(H), Hgb 10.0(L), Hct 30.5(L) Cbgs- 83, 135, 144, 148 x 24 hrs  Patient is currently intubated on ventilator support MV:  7.4 L/min Temp (24hrs), Avg:99.3 F (37.4 C), Min:98.6 F (37 C), Max:100.1 F (37.8 C)  Propofol: 6.65ml/34mprovides 175kcal/day   MAP- >65mmHg21mOP- 1575ml   44m Order:   Diet Order             Diet NPO time specified  Diet effective now                  EDUCATION NEEDS:   No education needs have been identified at this time  Skin:  Skin Assessment: Reviewed RN Assessment (Stage I buttocks)  Last BM:  9/5- type 7  Height:   Ht Readings from Last 1 Encounters:  04/14/2021 '5\' 2"'$  (1.575 m)    Weight:   Wt Readings from Last 1 Encounters:  04/21/21 79.4 kg    Ideal Body Weight:  50 kg  BMI:  Body mass index is 32.02 kg/m.  Estimated Nutritional Needs:   Kcal:  1576kcal/day  Protein:  95-110g/day  Fluid:  1.5L/day  Tina Sharp Please [facility]/or [facility] on-call/weekend/after hours pager

## 2021-04-21 ENCOUNTER — Inpatient Hospital Stay: Payer: Medicare HMO

## 2021-04-21 DIAGNOSIS — R0902 Hypoxemia: Secondary | ICD-10-CM | POA: Diagnosis not present

## 2021-04-21 DIAGNOSIS — J90 Pleural effusion, not elsewhere classified: Secondary | ICD-10-CM | POA: Diagnosis not present

## 2021-04-21 DIAGNOSIS — Z452 Encounter for adjustment and management of vascular access device: Secondary | ICD-10-CM | POA: Diagnosis not present

## 2021-04-21 DIAGNOSIS — R4182 Altered mental status, unspecified: Secondary | ICD-10-CM | POA: Diagnosis not present

## 2021-04-21 DIAGNOSIS — Z4682 Encounter for fitting and adjustment of non-vascular catheter: Secondary | ICD-10-CM | POA: Diagnosis not present

## 2021-04-21 LAB — BLOOD GAS, ARTERIAL
Acid-Base Excess: 3.5 mmol/L — ABNORMAL HIGH (ref 0.0–2.0)
Bicarbonate: 29.1 mmol/L — ABNORMAL HIGH (ref 20.0–28.0)
FIO2: 0.9
MECHVT: 450 mL
Mechanical Rate: 15
O2 Saturation: 99.7 %
PEEP: 8 cmH2O
Patient temperature: 37
RATE: 15 resp/min
pCO2 arterial: 48 mmHg (ref 32.0–48.0)
pH, Arterial: 7.39 (ref 7.350–7.450)
pO2, Arterial: 207 mmHg — ABNORMAL HIGH (ref 83.0–108.0)

## 2021-04-21 LAB — BASIC METABOLIC PANEL
Anion gap: 9 (ref 5–15)
BUN: 17 mg/dL (ref 6–20)
CO2: 28 mmol/L (ref 22–32)
Calcium: 7.8 mg/dL — ABNORMAL LOW (ref 8.9–10.3)
Chloride: 95 mmol/L — ABNORMAL LOW (ref 98–111)
Creatinine, Ser: 0.76 mg/dL (ref 0.44–1.00)
GFR, Estimated: >60 mL/min (ref >60)
Glucose, Bld: 78 mg/dL (ref 70–99)
Potassium: 3.4 mmol/L — ABNORMAL LOW (ref 3.5–5.1)
Sodium: 132 mmol/L — ABNORMAL LOW (ref 135–145)

## 2021-04-21 LAB — HEPATIC FUNCTION PANEL
ALT: 15 U/L (ref 0–44)
AST: 53 U/L — ABNORMAL HIGH (ref 15–41)
Albumin: 1.9 g/dL — ABNORMAL LOW (ref 3.5–5.0)
Alkaline Phosphatase: 105 U/L (ref 38–126)
Bilirubin, Direct: 0.6 mg/dL — ABNORMAL HIGH (ref 0.0–0.2)
Indirect Bilirubin: 0.9 mg/dL (ref 0.3–0.9)
Total Bilirubin: 1.5 mg/dL — ABNORMAL HIGH (ref 0.3–1.2)
Total Protein: 6 g/dL — ABNORMAL LOW (ref 6.5–8.1)

## 2021-04-21 LAB — PROTIME-INR
INR: 2.1 — ABNORMAL HIGH (ref 0.8–1.2)
Prothrombin Time: 23.3 seconds — ABNORMAL HIGH (ref 11.4–15.2)

## 2021-04-21 LAB — TROPONIN I (HIGH SENSITIVITY): Troponin I (High Sensitivity): 122 ng/L (ref <18)

## 2021-04-21 LAB — GLUCOSE, CAPILLARY
Glucose-Capillary: 88 mg/dL (ref 70–99)
Glucose-Capillary: 80 mg/dL (ref 70–99)
Glucose-Capillary: 85 mg/dL (ref 70–99)
Glucose-Capillary: 94 mg/dL (ref 70–99)
Glucose-Capillary: 128 mg/dL — ABNORMAL HIGH (ref 70–99)
Glucose-Capillary: 129 mg/dL — ABNORMAL HIGH (ref 70–99)

## 2021-04-21 LAB — PHOSPHORUS: Phosphorus: 3.3 mg/dL (ref 2.5–4.6)

## 2021-04-21 LAB — MAGNESIUM: Magnesium: 1.8 mg/dL (ref 1.7–2.4)

## 2021-04-21 LAB — PROCALCITONIN: Procalcitonin: 1.22 ng/mL

## 2021-04-21 LAB — BRAIN NATRIURETIC PEPTIDE: B Natriuretic Peptide: 1031.2 pg/mL — ABNORMAL HIGH (ref 0.0–100.0)

## 2021-04-21 MED ORDER — PROSOURCE TF PO LIQD
45.0000 mL | Freq: Every day | ORAL | Status: DC
  Administered 2021-04-22: 45 mL
  Filled 2021-04-21: qty 45

## 2021-04-21 MED ORDER — HYDROMORPHONE HCL 2 MG PO TABS
2.0000 mg | ORAL_TABLET | Freq: Three times a day (TID) | ORAL | Status: DC
  Administered 2021-04-21 – 2021-04-22 (×3): 2 mg via ORAL
  Filled 2021-04-21 (×3): qty 1

## 2021-04-21 MED ORDER — ASCORBIC ACID 500 MG PO TABS
500.0000 mg | ORAL_TABLET | Freq: Two times a day (BID) | ORAL | Status: DC
  Administered 2021-04-21 – 2021-04-22 (×2): 500 mg
  Filled 2021-04-21 (×2): qty 1

## 2021-04-21 MED ORDER — VITAL AF 1.2 CAL PO LIQD
1000.0000 mL | ORAL | Status: DC
  Administered 2021-04-21 – 2021-04-22 (×2): 1000 mL

## 2021-04-21 MED ORDER — FREE WATER
30.0000 mL | Status: DC
  Administered 2021-04-21 – 2021-04-22 (×8): 30 mL

## 2021-04-21 MED ORDER — CHLORDIAZEPOXIDE HCL 25 MG PO CAPS
25.0000 mg | ORAL_CAPSULE | Freq: Three times a day (TID) | ORAL | Status: DC
  Administered 2021-04-21 – 2021-04-22 (×3): 25 mg via ORAL
  Filled 2021-04-21 (×3): qty 1

## 2021-04-21 MED ORDER — POTASSIUM CHLORIDE 20 MEQ PO PACK
40.0000 meq | PACK | Freq: Once | ORAL | Status: AC
  Administered 2021-04-21: 40 meq
  Filled 2021-04-21: qty 2

## 2021-04-21 MED ORDER — MAGNESIUM SULFATE 2 GM/50ML IV SOLN
2.0000 g | Freq: Once | INTRAVENOUS | Status: AC
  Administered 2021-04-21: 2 g via INTRAVENOUS
  Filled 2021-04-21: qty 50

## 2021-04-21 MED ORDER — MIDAZOLAM-SODIUM CHLORIDE 100-0.9 MG/100ML-% IV SOLN
0.5000 mg/h | INTRAVENOUS | Status: DC
  Administered 2021-04-21: 2 mg/h via INTRAVENOUS
  Filled 2021-04-21: qty 100

## 2021-04-21 MED ORDER — FUROSEMIDE 10 MG/ML IJ SOLN
40.0000 mg | Freq: Once | INTRAMUSCULAR | Status: AC
  Administered 2021-04-21: 40 mg via INTRAVENOUS
  Filled 2021-04-21: qty 4

## 2021-04-21 MED ORDER — SODIUM CHLORIDE 0.9 % IV SOLN
500.0000 mg | INTRAVENOUS | Status: DC
  Administered 2021-04-21 – 2021-04-22 (×2): 500 mg via INTRAVENOUS
  Filled 2021-04-21 (×3): qty 500

## 2021-04-21 MED ORDER — IOHEXOL 350 MG/ML SOLN
75.0000 mL | Freq: Once | INTRAVENOUS | Status: AC | PRN
  Administered 2021-04-21: 75 mL via INTRAVENOUS

## 2021-04-21 MED ORDER — DEXMEDETOMIDINE HCL IN NACL 400 MCG/100ML IV SOLN: 0.4000 ug/kg/h | INTRAVENOUS | Status: DC

## 2021-04-21 MED ORDER — SODIUM CHLORIDE 0.9 % IV SOLN
2.0000 g | INTRAVENOUS | Status: DC
Start: 2021-04-21 — End: 2021-04-22
  Administered 2021-04-21 – 2021-04-22 (×2): 2 g via INTRAVENOUS
  Filled 2021-04-21 (×2): qty 2
  Filled 2021-04-21: qty 20

## 2021-04-21 NOTE — Progress Notes (Signed)
Spoke with Tina Sharp and Dr. Loni Muse. Patients right arm swollen. 3 PIV's to the right arm. Pulses are palpable. Patients heart rate 108-150's. Appears to be in and out of afib, sinus tachy, and PAC. Vent alarming regulation pressures. Per Tina Sharp increased sedation and made some vent changes. Continue to assess.

## 2021-04-21 NOTE — Progress Notes (Signed)
CRITICAL CARE PROGRESS NOTE    Name: Tina Sharp MRN: [id number] DOB: 30-May-1966     LOS: 5   SUBJECTIVE FINDINGS & SIGNIFICANT EVENTS    Patient description:  55 yo F arrived to MICU from medical floor with acute respiratory distress and hypoxemia in unresponsive state. PMH includes multiple strokes and CHF with progression per family at bedside.  I was able to speak with 3 family members who share that patient last week had letter from her doctor to skip jury duty due to her worsening heart failure.  She had CXR done with bilateral pleural effusions and pulmonary edema on arrival.   04/20/21- patient improved with less infiltrates on CXR. Diuresed over 1L in past 24h. Weaning FiO2 on ventinlator in preparation for SBT  04/21/21- patient had declined overnight with increased FiO2 on MV up to 90%FIO2 and AfRVR. Plan for CTPE and CT head. Patient on levophed.   Lines/tubes : Airway 7.5 mm (Active)  Secured at (cm) 25 cm 04/19/21 1127  Measured From Lips 04/19/21 [place] 04/19/21 1127  Secured By [employer] 04/19/21 1127  Cuff Pressure (cm H2O) Green OR 18-26 CmH2O 04/19/21 1127     Closed System Drain 1 Right Hip Accordion (Hemovac) (Active)     Urethral Catheter GINA RN (Active)    Microbiology/Sepsis markers: Results for orders placed or performed during the hospital encounter of 05/12/2021  Resp Panel by RT-PCR (Flu A&B, Covid) Nasopharyngeal Swab     Status: None   Collection Time: 05/11/2021  2:49 PM   Specimen: Nasopharyngeal Swab; Nasopharyngeal(NP) swabs in vial transport medium  Result Value Ref Range Status   SARS Coronavirus 2 by RT PCR NEGATIVE NEGATIVE Final    Comment: (NOTE) SARS-CoV-2 target nucleic acids are NOT DETECTED.  The SARS-CoV-2 RNA is generally  detectable in upper respiratory specimens during the acute phase of infection. The lowest concentration of SARS-CoV-2 viral copies this assay can detect is 138 copies/mL. A negative result does not preclude SARS-Cov-2 infection and should not be used as the sole basis for treatment or other patient management decisions. A negative result may occur with  improper specimen collection/handling, submission of specimen other than nasopharyngeal swab, presence of viral mutation(s) within the areas targeted by this assay, and inadequate number of viral copies(<138 copies/mL). A negative result must be combined with clinical observations, patient history, and epidemiological information. The expected result is Negative.  Fact Sheet for Patients:  EntrepreneurPulse.com.au  Fact Sheet for Healthcare Providers:  IncredibleEmployment.be  This test is no t yet approved or cleared by the [place] FDA and  has been authorized for detection and/or diagnosis of SARS-CoV-2 by FDA under an Emergency Use Authorization (EUA). This EUA will remain  in effect (meaning this test can be used) for the duration of the COVID-19 declaration under Section 564(b)(1) of the Act, 21 U.S.C.section 360bbb-3(b)(1), unless the authorization is terminated  or revoked sooner.       Influenza A by PCR NEGATIVE NEGATIVE Final   Influenza B by PCR NEGATIVE NEGATIVE Final    Comment: (NOTE) The Xpert Xpress SARS-CoV-2/FLU/RSV plus assay is intended as an aid in the diagnosis of influenza from Nasopharyngeal swab specimens and should not be used as a sole basis for treatment. Nasal washings and aspirates are unacceptable for Xpert Xpress SARS-CoV-2/FLU/RSV testing.  Fact Sheet for Patients: EntrepreneurPulse.com.au  Fact Sheet for Healthcare Providers: IncredibleEmployment.be  This test is not yet approved or cleared by the [place]  States FDA  and has been authorized for detection and/or diagnosis of SARS-CoV-2 by FDA under an Emergency Use Authorization (EUA). This EUA will remain in effect (meaning this test can be used) for the duration of the COVID-19 declaration under Section 564(b)(1) of the Act, 21 U.S.C. section 360bbb-3(b)(1), unless the authorization is terminated or revoked.  Performed at [facility], [address] [place], [place] [address]   Urine Culture     Status: None   Collection Time: 04/19/21 10:13 AM   Specimen: Urine, Clean Catch  Result Value Ref Range Status   Specimen Description   Final    URINE, CLEAN CATCH Performed at [facility], [address], [place], [place] [address]    Special Requests   Final    NONE Performed at [facility], [address] [place], [place] [address]    Culture   Final    NO GROWTH Performed at [facility], [place] [address] [place], [place] [address]    Report Status 04/20/2021 FINAL  Final  Respiratory (~20 pathogens) panel by PCR     Status: None   Collection Time: 04/19/21 10:14 AM   Specimen: Nasopharyngeal Swab; Respiratory  Result Value Ref Range Status   Adenovirus NOT DETECTED NOT DETECTED Final   Coronavirus 229E NOT DETECTED NOT DETECTED Final    Comment: (NOTE) The Coronavirus on the Respiratory Panel, DOES NOT test for the novel  Coronavirus (2019 nCoV)    Coronavirus HKU1 NOT DETECTED NOT DETECTED Final   Coronavirus NL63 NOT DETECTED NOT DETECTED Final   Coronavirus OC43 NOT DETECTED NOT DETECTED Final   Metapneumovirus NOT DETECTED NOT DETECTED Final   Rhinovirus / Enterovirus NOT DETECTED NOT DETECTED Final   Influenza A NOT DETECTED NOT DETECTED Final   Influenza B NOT DETECTED NOT DETECTED Final   Parainfluenza Virus 1 NOT DETECTED NOT DETECTED Final   Parainfluenza Virus 2 NOT DETECTED NOT DETECTED Final   Parainfluenza Virus 3 NOT DETECTED NOT DETECTED Final   Parainfluenza Virus 4 NOT DETECTED  NOT DETECTED Final   Respiratory Syncytial Virus NOT DETECTED NOT DETECTED Final   Bordetella pertussis NOT DETECTED NOT DETECTED Final   Bordetella Parapertussis NOT DETECTED NOT DETECTED Final   Chlamydophila pneumoniae NOT DETECTED NOT DETECTED Final   Mycoplasma pneumoniae NOT DETECTED NOT DETECTED Final    Comment: Performed at [facility], [place] [address], [place], [place] [address]  CULTURE, BLOOD (ROUTINE X 2) w Reflex to ID Panel     Status: None (Preliminary result)   Collection Time: 04/19/21 10:22 AM   Specimen: BLOOD  Result Value Ref Range Status   Specimen Description BLOOD BLOOD RIGHT HAND  Final   Special Requests   Final    BOTTLES DRAWN AEROBIC AND ANAEROBIC Blood Culture adequate volume   Culture   Final    NO GROWTH 2 DAYS Performed at [facility], [address] [place],  [address]    Report Status PENDING  Incomplete  CULTURE, BLOOD (ROUTINE X 2) w Reflex to ID Panel     Status: None (Preliminary result)   Collection Time: 04/19/21 10:34 AM   Specimen: BLOOD  Result Value Ref Range Status   Specimen Description BLOOD BLOOD RIGHT FOREARM  Final   Special Requests   Final    BOTTLES DRAWN AEROBIC AND ANAEROBIC Blood Culture adequate volume   Culture   Final    NO GROWTH 2 DAYS Performed at [facility], [address] [place], [place] [address]    Report Status PENDING  Incomplete  MRSA Next Gen by PCR, Nasal     Status: None   Collection Time: 04/19/21 11:44 AM   Specimen: Nasal Mucosa; Nasal Swab  Result Value Ref Range Status   MRSA by PCR Next Gen NOT DETECTED NOT DETECTED Final    Comment: (NOTE) The GeneXpert MRSA Assay (FDA approved for NASAL specimens only), is one component of a comprehensive MRSA colonization surveillance program. It is not intended to diagnose MRSA infection nor to guide or monitor treatment for MRSA infections. Test performance is not FDA approved in patients less than 64  years old. Performed at [facility], [address] [place], [place] [address]   Culture, Respiratory w Gram Stain     Status: None (Preliminary result)   Collection Time: 04/19/21 10:49 PM   Specimen: Tracheal Aspirate  Result Value Ref Range Status   Specimen Description   Final    TRACHEAL ASPIRATE Performed at [facility], [address], [place], [place] [address]    Special Requests   Final    NONE Performed at [facility], [address] [place], [place] [address]    Gram Stain   Final    MODERATE SQUAMOUS EPITHELIAL CELLS PRESENT MODERATE WBC SEEN NO ORGANISMS SEEN    Culture   Final    CULTURE REINCUBATED FOR BETTER GROWTH Performed at [facility], [place] [address], [place], [place] [address]    Report Status PENDING  Incomplete    Anti-infectives:  Anti-infectives (From admission, onward)    Start     Dose/Rate Route Frequency Ordered Stop   04/20/21 1500  vancomycin (VANCOREADY) IVPB 1500 mg/300 mL        1,500 mg 150 mL/hr over 120 Minutes Intravenous Every 24 hours 04/19/21 1316     04/19/21 1415  vancomycin (VANCOREADY) IVPB 1750 mg/350 mL        1,750 mg 175 mL/hr over 120 Minutes Intravenous  Once 04/19/21 1316 04/19/21 1708   04/19/21 1415  ceFEPIme (MAXIPIME) 2 g in sodium chloride 0.9 % 100 mL IVPB        2 g 200 mL/hr over 30 Minutes Intravenous Every 8 hours 04/19/21 1316           PAST MEDICAL HISTORY   Past Medical History:  Diagnosis Date   Anxiety    Breast cancer ([place]) 01/2014   Invasive lobular carcinoma, 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 ER/ PR+; Her 2 neu not overexpressed, microscopic positive margin (skin). Oncotype DX, low risk for recurrence.   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 Negative ileal and colon biopsies.    EP IMPLANTABLE DEVICE N/A 06/14/2015   Procedure: Loop Recorder Insertion;  Surgeon: Evans Lance, MD;  Location: [facility];  Service: Cardiovascular;  Laterality: N/A;   HIP ARTHROPLASTY Right 08/01/2016   Procedure: ARTHROPLASTY BIPOLAR HIP (HEMIARTHROPLASTY);  Surgeon: Dereck Leep, MD;  Location: [facility];  Service:  Orthopedics;  Laterality: Right;   KYPHOPLASTY N/A 09/16/2018   Procedure: KYPHOPLASTY T12;  Surgeon: Hessie Knows, MD;  Location: [facility];  Service: Orthopedics;  Laterality: N/A;   LAPAROSCOPIC HYSTERECTOMY  2010   [facility]   MASTECTOMY Left 2016   REDUCTION MAMMAPLASTY Right 2016   Lift   reduction mammoplasty Right 04/08/14   Dr Tula Nakayama   TEE WITHOUT CARDIOVERSION N/A 06/14/2015   Procedure: TRANSESOPHAGEAL ECHOCARDIOGRAM (TEE);  Surgeon: Josue Hector, MD;  Location: [facility];  Service: Cardiovascular;  Laterality: N/A;   TONSILLECTOMY AND ADENOIDECTOMY     pt was 55 years old     FAMILY HISTORY   Family History  Problem Relation Age of Onset   Breast cancer Mother 25   Ovarian cancer Other    Stroke Paternal Grandmother    Breast cancer Maternal Grandmother 7   Cancer Maternal Grandfather 57       prostate   Prostate cancer Maternal Grandfather      SOCIAL HISTORY   Social History   Tobacco Use   Smoking status: Every Day    Packs/day: 0.25    Types: Cigarettes   Smokeless tobacco: Never   Tobacco comments:    E cig  Vaping Use   Vaping Use: Never used  Substance Use Topics   Alcohol use: Not Currently    Comment: 1-2 bottles per day   Drug use: No     MEDICATIONS   Current Medication:  Current Facility-Administered Medications:    0.9 %  sodium chloride infusion, 250 mL,  Intravenous, Continuous, Dorothe Pea, RPH, Last Rate: 10 mL/hr at 04/19/21 1318, 250 mL at 04/19/21 1318   acetaminophen (TYLENOL) tablet 500 mg, 500 mg, Oral, Q6H PRN, 500 mg at 04/19/21 1017 **OR** [DISCONTINUED] acetaminophen (TYLENOL) suppository 650 mg, 650 mg, Rectal, Q6H PRN, Karmen Bongo, MD   albumin human 25 % solution 12.5 g, 12.5 g, Intravenous, Daily, Lanney Gins, Santiel Topper, MD, Stopped at 04/20/21 1056   atorvastatin (LIPITOR) tablet 40 mg, 40 mg, Per Tube, q1800, Lorella Nimrod, MD, 40 mg at 04/20/21 1716   baclofen (LIORESAL) tablet 10 mg, 10 mg, Oral, TID PRN, Karmen Bongo, MD   bisacodyl (DULCOLAX) EC tablet 5 mg, 5 mg, Oral, Daily PRN, Karmen Bongo, MD   budesonide (PULMICORT) nebulizer solution 0.5 mg, 0.5 mg, Nebulization, BID, Darel Hong D, NP, 0.5 mg at 04/21/21 0751   buPROPion (WELLBUTRIN XL) 24 hr tablet 150 mg, 150 mg, Oral, Daily, Karmen Bongo, MD, 150 mg at 04/18/21 0842   ceFEPIme (MAXIPIME) 2 g in sodium chloride 0.9 % 100 mL IVPB, 2 g, Intravenous, Q8H, Amin, Soundra Pilon, MD, Last Rate: 200 mL/hr at 04/21/21 0550, 2 g at 04/21/21 0550   chlorhexidine gluconate (MEDLINE KIT) (PERIDEX) 0.12 % solution 15 mL, 15 mL, Mouth Rinse, BID, [place], Bing Neighbors, NP, 15 mL at 04/21/21 2297   Chlorhexidine Gluconate Cloth 2 % PADS 6 each, 6 each, Topical, Daily, Darel Hong D, NP, 6 each at 04/20/21 0958   docusate (COLACE) 50 MG/5ML liquid 100 mg, 100 mg, Per Tube, BID, Darel Hong D, NP, 100 mg at 04/21/21 0919   DULoxetine (CYMBALTA) DR capsule 60 mg, 60 mg, Oral, Daily, Karmen Bongo, MD, 60 mg at 04/18/21 0842   fentaNYL (SUBLIMAZE) bolus via infusion 50-100 mcg, 50-100 mcg, Intravenous, Q15 min PRN, Darel Hong D, NP, 100 mcg at 04/20/21 1838   fentaNYL 2573mg in NS 2564m(105mml) infusion-PREMIX, 50-200 mcg/hr, Intravenous, Continuous, KeeBradly BienenstockP, Last Rate:  17.5 mL/hr at 04/21/21 0839, 175 mcg/hr at 68/61/68 3729   folic acid  (FOLVITE) tablet 1 mg, 1 mg, Per Tube, Daily, Lorella Nimrod, MD, 1 mg at 04/21/21 0211   furosemide (LASIX) injection 20 mg, 20 mg, Intravenous, BID, Ottie Glazier, MD, 20 mg at 04/21/21 0727   gabapentin (NEURONTIN) tablet 600 mg, 600 mg, Per Tube, QID, Lorella Nimrod, MD, 600 mg at 04/21/21 1552   hydrALAZINE (APRESOLINE) injection 5 mg, 5 mg, Intravenous, Q4H PRN, Karmen Bongo, MD   ipratropium-albuterol (DUONEB) 0.5-2.5 (3) MG/3ML nebulizer solution 3 mL, 3 mL, Nebulization, Q4H, Amin, Soundra Pilon, MD, 3 mL at 04/21/21 0751   levalbuterol (XOPENEX) nebulizer solution 1.25 mg, 1.25 mg, Nebulization, Q6H PRN, Lorella Nimrod, MD, 1.25 mg at 04/20/21 2034   MEDLINE mouth rinse, 15 mL, Mouth Rinse, 10 times per day, Lang Snow, NP, 15 mL at 04/21/21 0920   midodrine (PROAMATINE) tablet 10 mg, 10 mg, Per Tube, TID WC, Lorella Nimrod, MD, 10 mg at 04/21/21 0802   multivitamin with minerals tablet 1 tablet, 1 tablet, Per Tube, Daily, Lorella Nimrod, MD, 1 tablet at 04/21/21 2336   nicotine (NICODERM CQ - dosed in mg/24 hr) patch 7 mg, 7 mg, Transdermal, Daily PRN, Karmen Bongo, MD   norepinephrine (LEVOPHED) 37m in 251mpremix infusion, 2-10 mcg/min, Intravenous, Titrated, DoDorothe PeaRPH, Last Rate: 3.75 mL/hr at 04/20/21 1547, 1 mcg/min at 04/20/21 1547   ondansetron (ZOFRAN) tablet 4 mg, 4 mg, Oral, Q6H PRN **OR** ondansetron (ZOFRAN) injection 4 mg, 4 mg, Intravenous, Q6H PRN, YaKarmen BongoMD   pantoprazole sodium (PROTONIX) 40 mg oral suspension 40 mg, 40 mg, Per Tube, Daily, AmLorella NimrodMD, 40 mg at 04/21/21 0916   polyethylene glycol (MIRALAX / GLYCOLAX) packet 17 g, 17 g, Oral, Daily PRN, YaKarmen BongoMD   polyethylene glycol (MIRALAX / GLYCOLAX) packet 17 g, 17 g, Per Tube, Daily, KeDarel Hong, NP, 17 g at 04/21/21 0917   propofol (DIPRIVAN) 1000 MG/100ML infusion, 0-50 mcg/kg/min, Intravenous, Continuous, KeDarel Hong, NP, Last Rate: 11.09 mL/hr at  04/21/21 0738, 25 mcg/kg/min at 04/21/21 0738   sodium chloride flush (NS) 0.9 % injection 3 mL, 3 mL, Intravenous, Q12H, YaKarmen BongoMD, 3 mL at 04/21/21 0920   thiamine 50069mn normal saline (82m85mVPB, 500 mg, Intravenous, Q8H, Kaley Jutras, MD, Last Rate: 100 mL/hr at 04/21/21 0508, 500 mg at 04/21/21 0508   [START ON 04/23/2021] thiamine tablet 100 mg, 100 mg, Per Tube, Daily **OR** [DISCONTINUED] thiamine (B-1) injection 100 mg, 100 mg, Intravenous, Daily, YateKarmen Bongo, 100 mg at 05/05/2021 1621   traMADol (ULTRAM) tablet 50 mg, 50 mg, Oral, Q6H PRN, McClCherene Altes, 50 mg at 04/17/21 2353   vancomycin (VANCOREADY) IVPB 1500 mg/300 mL, 1,500 mg, Intravenous, Q24H, AminLorella Nimrod, Stopped at 04/20/21 1747    ALLERGIES   Patient has no known allergies.    REVIEW OF SYSTEMS     Patient is unresponsive unable to give ROS  PHYSICAL EXAMINATION   Vital Signs: Temp:  [98.6 F (37 C)-100.1 F (37.8 C)] 100.1 F (37.8 C) (09/09 0700) Pulse Rate:  [65-112] 94 (09/09 0930) Resp:  [10-23] 13 (09/09 0930) BP: (78-124)/(44-71) 78/44 (09/09 0930) SpO2:  [86 %-97 %] 97 % (09/09 0930) FiO2 (%):  [40 %-100 %] 90 % (09/09 0400) Weight:  [79.4 kg] 79.4 kg (09/09 0321)  GENERAL:Age appropriate on MV HEAD: Normocephalic, atraumatic.  EYES: Pupils equal, round, reactive to light.  No scleral icterus.  MOUTH: Moist mucosal membrane. NECK: Supple. No thyromegaly. No nodules. No JVD.  PULMONARY: rhonchi and crackles bilaterallhy  CARDIOVASCULAR: S1 and S2. Regular rate and rhythm. No murmurs, rubs, or gallops.  GASTROINTESTINAL: Soft, nontender, non-distended. No masses. Positive bowel sounds. No hepatosplenomegaly.  MUSCULOSKELETAL: No swelling, clubbing, or edema.  NEUROLOGIC: GCS4t SKIN:intact,warm,dry   PERTINENT DATA     Infusions:  sodium chloride 250 mL (04/19/21 1318)   albumin human Stopped (04/20/21 1056)   ceFEPime (MAXIPIME) IV 2 g (04/21/21  0550)   fentaNYL infusion INTRAVENOUS 175 mcg/hr (04/21/21 0839)   norepinephrine (LEVOPHED) Adult infusion 1 mcg/min (04/20/21 1547)   propofol (DIPRIVAN) infusion 25 mcg/kg/min (04/21/21 0738)   thiamine injection 500 mg (04/21/21 0508)   vancomycin Stopped (04/20/21 1747)   Scheduled Medications:  atorvastatin  40 mg Per Tube q1800   budesonide (PULMICORT) nebulizer solution  0.5 mg Nebulization BID   buPROPion  150 mg Oral Daily   chlorhexidine gluconate (MEDLINE KIT)  15 mL Mouth Rinse BID   Chlorhexidine Gluconate Cloth  6 each Topical Daily   docusate  100 mg Per Tube BID   DULoxetine  60 mg Oral Daily   folic acid  1 mg Per Tube Daily   furosemide  20 mg Intravenous BID   gabapentin  600 mg Per Tube QID   ipratropium-albuterol  3 mL Nebulization Q4H   mouth rinse  15 mL Mouth Rinse 10 times per day   midodrine  10 mg Per Tube TID WC   multivitamin with minerals  1 tablet Per Tube Daily   pantoprazole sodium  40 mg Per Tube Daily   polyethylene glycol  17 g Per Tube Daily   sodium chloride flush  3 mL Intravenous Q12H   [START ON 04/23/2021] thiamine  100 mg Per Tube Daily   PRN Medications: acetaminophen **OR** [DISCONTINUED] acetaminophen, baclofen, bisacodyl, fentaNYL, hydrALAZINE, levalbuterol, nicotine, ondansetron **OR** ondansetron (ZOFRAN) IV, polyethylene glycol, traMADol Hemodynamic parameters:   Intake/Output: 09/08 0701 - 09/09 0700 In: 1256.7 [I.V.:506.8; IV Piggyback:749.9] Out: 1350 [Urine:1350]  Ventilator  Settings: Vent Mode: PRVC FiO2 (%):  [40 %-100 %] 90 % Set Rate:  [15 bmp] 15 bmp Vt Set:  [450 mL] 450 mL PEEP:  [5 cmH20-8 cmH20] 8 cmH20    LAB RESULTS:  Basic Metabolic Panel: Recent Labs  Lab 04/28/2021 1208 04/17/21 0429 04/18/21 0404 04/19/21 0234 04/20/21 0532 04/21/21 0723  NA 128* 131* 132* 132* 133* 132*  K 2.4* 2.9* 4.2 4.1 3.7 3.4*  CL 81* 88* 95* 98 98 95*  CO2 34* 37* '31 27 26 28  ' GLUCOSE 100* 92 81 84 134* 78  BUN <5*  5* '6 8 12 17  ' CREATININE 0.63 0.65 0.63 0.60 0.60 0.76  CALCIUM 7.7* 7.4* 7.4* 7.4* 7.6* 7.8*  MG 1.8 2.2 1.9 1.9  --  1.8  PHOS  --  3.5 2.3* 2.8  --  3.3    Liver Function Tests: Recent Labs  Lab 05/04/2021 1208 04/17/21 0429 04/18/21 0404 04/20/21 0532  AST 33 '28 28 26  ' ALT '20 15 17 14  ' ALKPHOS 139* 118 126 89  BILITOT 0.8 0.9 0.8 0.8  PROT 6.3* 5.4* 5.6* 5.7*  ALBUMIN 2.0* 1.7* 1.9* 2.0*    No results for input(s): LIPASE, AMYLASE in the last 168 hours. No results for input(s): AMMONIA in the last 168 hours. CBC: Recent Labs  Lab 05/11/2021 1208 04/17/21 0429 04/18/21 0404 04/19/21 1023 04/20/21 0532  WBC 9.2 7.3  8.6 12.9* 15.4*  HGB 11.4* 10.2* 10.7* 10.1* 10.0*  HCT 32.2* 28.7* 31.9* 29.0* 30.5*  MCV 92.8 92.9 95.5 94.5 96.2  PLT 296 248 286 330 319    Cardiac Enzymes: No results for input(s): CKTOTAL, CKMB, CKMBINDEX, TROPONINI in the last 168 hours. BNP: Invalid input(s): POCBNP CBG: Recent Labs  Lab 04/20/21 1905 04/20/21 1945 04/20/21 2313 04/21/21 0313 04/21/21 0715  GLUCAP 100* 95 95 88 80        IMAGING RESULTS:  Imaging: DG Chest 1 View  Result Date: 04/20/2021 CLINICAL DATA:  Intubated EXAM: CHEST  1 VIEW COMPARISON:  04/20/2021, CT 10/31/2012, radiograph 04/19/2021 FINDINGS: Endotracheal tube tip is about 1.3 cm superior to the carina. Esophageal tube tip below the diaphragm but incompletely visualized. Electronic device over the lower chest. Cardiomegaly with vascular congestion. Diffuse bilateral heterogeneous airspace opacities as before with small pleural effusions. No pneumothorax. Small amount of enteral contrast in the upper abdomen. IMPRESSION: 1. Endotracheal tube tip about 13 mm superior to carina 2. Bilateral effusions and diffuse bilateral heterogeneous airspace opacities which may be due to pneumonia or infection, aeration is improved compared to prior radiographs. 3. Mild cardiomegaly with vascular congestion Electronically  Signed   By: Donavan Foil M.D.   On: 04/20/2021 20:51   DG Abd 1 View  Result Date: 04/19/2021 CLINICAL DATA:  Encounter for intubation. EXAM: ABDOMEN - 1 VIEW COMPARISON:  Radiographs earlier the same date and 07/30/2016. FINDINGS: 1150 hours. Interval intubation. Tip of the endotracheal tube is 1.9 cm above the carina. Enteric tube projects below the diaphragm, overlapping the distal stomach or proximal duodenum. Stable cardiomegaly. Loop recorder overlies the chest. The lung apices are partially excluded. There are bibasilar airspace opacities with interval slight worsening on the left. Probable bilateral pleural effusions superimposed on chronic right hemidiaphragm elevation. No evidence of pneumothorax. IMPRESSION: 1. Satisfactory position of the endotracheal and nasogastric tubes. 2. Worsening asymmetric left lung airspace opacities suspicious for pneumonia/aspiration. Electronically Signed   By: Richardean Sale M.D.   On: 04/19/2021 12:30   DG Chest Port 1 View  Result Date: 04/21/2021 CLINICAL DATA:  Central line placement. EXAM: PORTABLE CHEST 1 VIEW COMPARISON:  04/20/2021 FINDINGS: The endotracheal tube and NG tubes are in good position and unchanged. New right IJ central venous catheter tip is in the distal SVC near the cavoatrial junction. Improved lung aeration but some persistent peribronchial thickening and increased interstitial markings suggesting pulmonary edema. No definite pleural effusions. IMPRESSION: 1. New right IJ central venous catheter in good position without complicating features. 2. Improved lung aeration. Electronically Signed   By: Marijo Sanes M.D.   On: 04/21/2021 09:54   DG Chest Port 1 View  Result Date: 04/20/2021 CLINICAL DATA:  Acute respiratory failure with hypoxia EXAM: PORTABLE CHEST - 1 VIEW COMPARISON:  the previous day's study FINDINGS: Endotracheal tube tip remains less than 2 cm above carina. Nasogastric tube extends to the decompressed stomach. Implanted  monitoring device overlies the heart. Low lung volumes with diffuse airspace opacities predominantly in the lung bases, slightly improved on the left and increased on the right since previous exam. Probable small right pleural effusion.  No pneumothorax. Posttraumatic change in the proximal left humerus as before. Changes of vertebral cement augmentation near the thoracolumbar junction. IMPRESSION: Low volumes with persistent airspace opacities, worsening right and slightly improved left. Persistent low position of endotracheal tube tip, consider 2 cm retraction. Electronically Signed   By: Lucrezia Europe M.D.   On: 04/20/2021  08:03   Portable Chest x-ray  Result Date: 04/19/2021 CLINICAL DATA:  Intubation EXAM: PORTABLE CHEST 1 VIEW COMPARISON:  Chest x-ray dated April 19, 2021 FINDINGS: Interval intubation with ET tube approximately 1.5 cm from the carina. Enteric tube partially visualized coursing below the diaphragm. Similar left greater than right heterogeneous opacities and small bilateral pleural effusions. No evidence of pneumothorax. Visualized cardiac and mediastinal contours are unchanged. IMPRESSION: Interval intubation with ET tube approximately 1.5 cm from the carina. Electronically Signed   By: Yetta Glassman M.D.   On: 04/19/2021 12:27   ECHOCARDIOGRAM COMPLETE  Result Date: 04/20/2021    ECHOCARDIOGRAM REPORT   Patient Name:   Tina Sharp Date of Exam: 04/19/2021 Medical Rec #:  [id number]       Height:       62.0 in Accession #:    [id number]      Weight:       163.0 lb Date of Birth:  [birth year]       BSA:          1.753 m Patient Age:    89 years        BP:           97/63 mmHg Patient Gender: F               HR:           83 bpm. Exam Location:  [facility] Procedure: 2D Echo, Cardiac Doppler and Color Doppler Indications:     I50.9 Congestive heart failure.  History:         Patient has prior history of Echocardiogram examinations, most                  recent 06/14/2015. Risk  Factors:Hypertension. History of breast                  cancer. Stroke.  Sonographer:     Cresenciano Lick RDCS Referring Phys:  [id number] Ottie Glazier Diagnosing Phys: Yolonda Kida MD IMPRESSIONS  1. Left ventricular ejection fraction, by estimation, is 40 to 45%. The left ventricle has mildly decreased function. The left ventricle demonstrates global hypokinesis. The left ventricular internal cavity size was moderately dilated. Left ventricular diastolic parameters are consistent with Grade I diastolic dysfunction (impaired relaxation).  2. Right ventricular systolic function is mildly reduced. The right ventricular size is mildly enlarged.  3. Left atrial size was mildly dilated.  4. Right atrial size was mildly dilated.  5. The mitral valve is normal in structure. Mild mitral valve regurgitation.  6. The aortic valve is normal in structure. Aortic valve regurgitation is not visualized. FINDINGS  Left Ventricle: Left ventricular ejection fraction, by estimation, is 40 to 45%. The left ventricle has mildly decreased function. The left ventricle demonstrates global hypokinesis. The left ventricular internal cavity size was moderately dilated. There is no left ventricular hypertrophy. Left ventricular diastolic parameters are consistent with Grade I diastolic dysfunction (impaired relaxation). Right Ventricle: The right ventricular size is mildly enlarged. No increase in right ventricular wall thickness. Right ventricular systolic function is mildly reduced. Left Atrium: Left atrial size was mildly dilated. Right Atrium: Right atrial size was mildly dilated. Pericardium: There is no evidence of pericardial effusion. Mitral Valve: The mitral valve is normal in structure. Mild mitral valve regurgitation. Tricuspid Valve: The tricuspid valve is normal in structure. Tricuspid valve regurgitation is trivial. Aortic Valve: The aortic valve is normal in structure. Aortic valve regurgitation is not visualized.  Pulmonic  Valve: The pulmonic valve was normal in structure. Pulmonic valve regurgitation is not visualized. Aorta: The ascending aorta was not well visualized. IAS/Shunts: No atrial level shunt detected by color flow Doppler.  LEFT VENTRICLE PLAX 2D LVIDd:         4.00 cm  Diastology LVIDs:         2.60 cm  LV e' medial:    7.18 cm/s LV PW:         0.90 cm  LV E/e' medial:  9.1 LV IVS:        0.90 cm  LV e' lateral:   9.90 cm/s LVOT diam:     2.20 cm  LV E/e' lateral: 6.6 LV SV:         56 LV SV Index:   32 LVOT Area:     3.80 cm  RIGHT VENTRICLE RV Basal diam:  4.70 cm RV S prime:     10.40 cm/s TAPSE (M-mode): 1.9 cm LEFT ATRIUM             Index       RIGHT ATRIUM           Index LA diam:        3.50 cm 2.00 cm/m  RA Area:     17.20 cm LA Vol (A2C):   25.1 ml 14.32 ml/m RA Volume:   58.60 ml  33.44 ml/m LA Vol (A4C):   44.9 ml 25.62 ml/m LA Biplane Vol: 33.6 ml 19.17 ml/m  AORTIC VALVE LVOT Vmax:   71.80 cm/s LVOT Vmean:  48.000 cm/s LVOT VTI:    0.147 m  AORTA Ao Root diam: 3.20 cm MITRAL VALVE               TRICUSPID VALVE MV Area (PHT): 2.83 cm    TR Peak grad:   15.7 mmHg MV Decel Time: 268 msec    TR Vmax:        198.00 cm/s MV E velocity: 65.10 cm/s MV A velocity: 84.00 cm/s  SHUNTS MV E/A ratio:  0.77        Systemic VTI:  0.15 m                            Systemic Diam: 2.20 cm Yolonda Kida MD Electronically signed by Yolonda Kida MD Signature Date/Time: 04/20/2021/10:23:08 PM    Final    '@PROBHOSP' @ DG Chest 1 View  Result Date: 04/20/2021 CLINICAL DATA:  Intubated EXAM: CHEST  1 VIEW COMPARISON:  04/20/2021, CT 10/31/2012, radiograph 04/19/2021 FINDINGS: Endotracheal tube tip is about 1.3 cm superior to the carina. Esophageal tube tip below the diaphragm but incompletely visualized. Electronic device over the lower chest. Cardiomegaly with vascular congestion. Diffuse bilateral heterogeneous airspace opacities as before with small pleural effusions. No pneumothorax. Small amount of  enteral contrast in the upper abdomen. IMPRESSION: 1. Endotracheal tube tip about 13 mm superior to carina 2. Bilateral effusions and diffuse bilateral heterogeneous airspace opacities which may be due to pneumonia or infection, aeration is improved compared to prior radiographs. 3. Mild cardiomegaly with vascular congestion Electronically Signed   By: Donavan Foil M.D.   On: 04/20/2021 20:51   DG Chest Port 1 View  Result Date: 04/21/2021 CLINICAL DATA:  Central line placement. EXAM: PORTABLE CHEST 1 VIEW COMPARISON:  04/20/2021 FINDINGS: The endotracheal tube and NG tubes are in good position and unchanged. New right IJ central venous catheter tip is in  the distal SVC near the cavoatrial junction. Improved lung aeration but some persistent peribronchial thickening and increased interstitial markings suggesting pulmonary edema. No definite pleural effusions. IMPRESSION: 1. New right IJ central venous catheter in good position without complicating features. 2. Improved lung aeration. Electronically Signed   By: Marijo Sanes M.D.   On: 04/21/2021 09:54        ASSESSMENT AND PLAN    -Multidisciplinary rounds held today  Acute Hypoxic Respiratory Failure -with bilateral pulmonary edema and effusions cannot rule out infectious multifocal pneumonia    - procalcitonin is elevated     - empiric vancomycin and zosyn -continue Bronchodilator Therapy -Wean Fio2 and PEEP as tolerated -will perform SAT/SBT when respiratory parameters are met    -lasix challenge with levophed   Acute decompensated diastolic CHF EF >46% -oxygen as needed -Lasix as tolerated -TTE -midodrine to increase BP and allow for diuresis ICU monitoring  Hypervolemic hyponatremia -likely due to CHF -follow UO -continue Foley Catheter-assess need daily -pharmacy consultation  NEUROLOGY - intubated and sedated - minimal sedation to achieve a RASS goal: -1 Wake up assessment pending   ID -continue IV abx as  prescibed -follow up cultures  GI/Nutrition GI PROPHYLAXIS as indicated DIET-->TF's as tolerated Constipation protocol as indicated  ENDO - ICU hypoglycemic\Hyperglycemia protocol -check FSBS per protocol   ELECTROLYTES -follow labs as needed -replace as needed -pharmacy consultation   DVT/GI PRX ordered -SCDs  TRANSFUSIONS AS NEEDED MONITOR FSBS ASSESS the need for LABS as needed   Critical care provider statement:    Critical care time (minutes):  33   Critical care time was exclusive of:  Separately billable procedures and treating other patients   Critical care was necessary to treat or prevent imminent or life-threatening deterioration of the following conditions:  Acute CHF exacerbation vs pneumonia, acutely comatose, Acute hypoxemic respiratory failure.    Critical care was time spent personally by me on the following activities:  Development of treatment plan with patient or surrogate, discussions with consultants, evaluation of patient's response to treatment, examination of patient, obtaining history from patient or surrogate, ordering and performing treatments and interventions, ordering and review of laboratory studies and re-evaluation of patient's condition.  I assumed direction of critical care for this patient from another provider in my specialty: no    This document was prepared using Dragon voice recognition software and may include unintentional dictation errors.    Ottie Glazier, M.D.  Division of [place]

## 2021-04-21 NOTE — Progress Notes (Signed)
Updated pts daughter Beola Cord via telephone regarding CT Head and CTA Chest results all questions were answered.  Will continue to monitor and assess pt.  Rosilyn Mings, AGNP  Pulmonary/Critical Care Pager [phone] (please enter 7 digits) PCCM Consult Pager [phone] (please enter 7 digits)

## 2021-04-21 NOTE — Procedures (Signed)
Central Venous Catheter Insertion Procedure Note  Tina Sharp  HY:[id number]  Jun 18, 1966  Date:04/21/21  Time:9:15 AM   Provider Performing:Darryl Willner Jannette Fogo   Procedure: Insertion of Non-tunneled Central Venous [phone]) with US guidance [id number])   Indication(s) Medication administration  Consent Risks of the procedure as well as the alternatives and risks of each were explained to the patient and/or caregiver.  Consent for the procedure was obtained and is signed in the bedside chart  Anesthesia Topical only with 1% lidocaine   Timeout Verified patient identification, verified procedure, site/side was marked, verified correct patient position, special equipment/implants available, medications/allergies/relevant history reviewed, required imaging and test results available.  Sterile Technique Maximal sterile technique including full sterile barrier drape, hand hygiene, sterile gown, sterile gloves, mask, hair covering, sterile ultrasound probe cover (if used).  Procedure Description Area of catheter insertion was cleaned with chlorhexidine and draped in sterile fashion.  With real-time ultrasound guidance a central venous catheter was placed into the right internal jugular vein. Nonpulsatile blood flow and easy flushing noted in all ports.  The catheter was sutured in place and sterile dressing applied.  Complications/Tolerance None; patient tolerated the procedure well. Chest X-ray is ordered to verify placement for internal jugular or subclavian cannulation.   Chest x-ray is not ordered for femoral cannulation.  EBL Minimal  Specimen(s) None  Rosilyn Mings, AGNP  Pulmonary/Critical Care Pager [phone] (please enter 7 digits) PCCM Consult Pager [phone] (please enter 7 digits)

## 2021-04-21 NOTE — Progress Notes (Signed)
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 Presented with GIB with INR supratherapeutic at 10. Patient transferred to the ICU 9/7 for respiratory distress. She is currently intubated and sedated and requiring vasopressor for blood pressure support. Her anticoagulation remains on hold. Concern for pulmonary edema vs pneumonia, she is on antibiotics and diuretics. Plan for imaging 9/9. Pharmacy consult for electrolyte replacement.  Goal of Therapy:  Electrolytes WNL  Plan:  --Potassium replaced this morning --Electrolytes with morning labs  Tawnya Crook, PharmD, BCPS Clinical Pharmacist 04/21/2021 11:16 AM

## 2021-04-21 NOTE — Progress Notes (Signed)
   04/19/21 1000  Assess: MEWS Score  Temp (!) 101.2 F (38.4 C)  BP 106/74  Pulse Rate (!) 114  Resp 20  Level of Consciousness Responds to Pain  SpO2 94 %  O2 Device Nasal Cannula  O2 Flow Rate (L/min) 4 L/min  Assess: MEWS Score  MEWS Temp 1  MEWS Systolic 0  MEWS Pulse 2  MEWS RR 0  MEWS LOC 2  MEWS Score 5  MEWS Score Color Red  Assess: if the MEWS score is Yellow or Red  Were vital signs taken at a resting state? Yes  Focused Assessment No change from prior assessment  Does the patient meet 2 or more of the SIRS criteria? Yes  Does the patient have a confirmed or suspected source of infection? No  MEWS guidelines implemented *See Row Information* Yes  Treat  MEWS Interventions Other (Comment) (MD notified)  Pain Scale 0-10  Pain Score 0  Take Vital Signs  Increase Vital Sign Frequency  Red: Q 1hr X 4 then Q 4hr X 4, if remains red, continue Q 4hrs  Escalate  MEWS: Escalate Red: discuss with charge nurse/RN and provider, consider discussing with RRT  Notify: Charge Nurse/RN  Name of Charge Nurse/RN Notified Caryl Pina  Date Charge Nurse/RN Notified 04/19/21  Time Charge Nurse/RN Notified 1009  Notify: Provider  Provider Name/Title Dr. Reesa Chew  Date Provider Notified 04/19/21  Time Provider Notified [id number]  Notification Type Page  Notification Reason Other (Comment) (notified of red mews. elevated temp.)  Notify: Rapid Response  Name of Rapid Response RN Notified Clarise Cruz, RN  Date Rapid Response Notified 04/19/21  Time Rapid Response Notified [id number]  Document  Patient Outcome Not stable and remains on department  Assess: SIRS CRITERIA  SIRS Temperature  1  SIRS Pulse 1  SIRS Respirations  0  SIRS WBC 0  SIRS Score Sum  2  Inserted for Emmit Pomfret RN

## 2021-04-21 NOTE — Progress Notes (Signed)
RT assisted with patient transport to CT and back to ICU with no complications. Patient was transported on the Trilogy ventilator.

## 2021-04-21 NOTE — Progress Notes (Signed)
Dr A notified of patients troponin level. No additional orders given. Continue to assess.

## 2021-04-21 NOTE — Progress Notes (Signed)
Updated pts daughters Beola Cord and Maci Roulhac at bedside regarding pts condition and current plan of care.  Also, discussed code status, and at this time they would like pt to remain a FULL CODE.  All questions were answered will continue to monitor and assess pt.   Rosilyn Mings, AGNP  Pulmonary/Critical Care Pager [phone] (please enter 7 digits) PCCM Consult Pager [phone] (please enter 7 digits)

## 2021-04-22 LAB — HEPATIC FUNCTION PANEL
ALT: 15 U/L (ref 0–44)
AST: 52 U/L — ABNORMAL HIGH (ref 15–41)
Albumin: 1.7 g/dL — ABNORMAL LOW (ref 3.5–5.0)
Alkaline Phosphatase: 129 U/L — ABNORMAL HIGH (ref 38–126)
Bilirubin, Direct: 0.6 mg/dL — ABNORMAL HIGH (ref 0.0–0.2)
Indirect Bilirubin: 0.6 mg/dL (ref 0.3–0.9)
Total Bilirubin: 1.2 mg/dL (ref 0.3–1.2)
Total Protein: 5.8 g/dL — ABNORMAL LOW (ref 6.5–8.1)

## 2021-04-22 LAB — CBC WITH DIFFERENTIAL/PLATELET
Abs Immature Granulocytes: 0.37 10*3/uL — ABNORMAL HIGH (ref 0.00–0.07)
Basophils Absolute: 0 10*3/uL (ref 0.0–0.1)
Basophils Relative: 0 %
Eosinophils Absolute: 0.2 10*3/uL (ref 0.0–0.5)
Eosinophils Relative: 2 %
HCT: 28.3 % — ABNORMAL LOW (ref 36.0–46.0)
Hemoglobin: 9.4 g/dL — ABNORMAL LOW (ref 12.0–15.0)
Immature Granulocytes: 3 %
Lymphocytes Relative: 7 %
Lymphs Abs: 0.8 10*3/uL (ref 0.7–4.0)
MCH: 33 pg (ref 26.0–34.0)
MCHC: 33.2 g/dL (ref 30.0–36.0)
MCV: 99.3 fL (ref 80.0–100.0)
Monocytes Absolute: 0.8 10*3/uL (ref 0.1–1.0)
Monocytes Relative: 6 %
Neutro Abs: 10.1 10*3/uL — ABNORMAL HIGH (ref 1.7–7.7)
Neutrophils Relative %: 82 %
Platelets: 350 10*3/uL (ref 150–400)
RBC: 2.85 MIL/uL — ABNORMAL LOW (ref 3.87–5.11)
RDW: 15.7 % — ABNORMAL HIGH (ref 11.5–15.5)
WBC: 12.4 10*3/uL — ABNORMAL HIGH (ref 4.0–10.5)
nRBC: 0 % (ref 0.0–0.2)

## 2021-04-22 LAB — CREATININE, SERUM
Creatinine, Ser: 0.7 mg/dL (ref 0.44–1.00)
GFR, Estimated: >60 mL/min (ref >60)

## 2021-04-22 LAB — CULTURE, RESPIRATORY W GRAM STAIN: Culture: NORMAL

## 2021-04-22 LAB — GLUCOSE, CAPILLARY
Glucose-Capillary: 153 mg/dL — ABNORMAL HIGH (ref 70–99)
Glucose-Capillary: 134 mg/dL — ABNORMAL HIGH (ref 70–99)
Glucose-Capillary: 184 mg/dL — ABNORMAL HIGH (ref 70–99)
Glucose-Capillary: 134 mg/dL — ABNORMAL HIGH (ref 70–99)

## 2021-04-22 LAB — MAGNESIUM: Magnesium: 2.2 mg/dL (ref 1.7–2.4)

## 2021-04-22 LAB — PROTIME-INR
INR: 1.7 — ABNORMAL HIGH (ref 0.8–1.2)
Prothrombin Time: 19.8 seconds — ABNORMAL HIGH (ref 11.4–15.2)

## 2021-04-22 LAB — POTASSIUM: Potassium: 3.6 mmol/L (ref 3.5–5.1)

## 2021-04-22 LAB — PHOSPHORUS: Phosphorus: 3.1 mg/dL (ref 2.5–4.6)

## 2021-04-22 MED ORDER — HALOPERIDOL LACTATE 5 MG/ML IJ SOLN: 0.5000 mg | INTRAMUSCULAR | Status: DC | PRN

## 2021-04-22 MED ORDER — ACETAMINOPHEN 325 MG PO TABS: 650.0000 mg | ORAL_TABLET | Freq: Four times a day (QID) | ORAL | Status: DC | PRN

## 2021-04-22 MED ORDER — HEPARIN (PORCINE) 25000 UT/250ML-% IV SOLN
1000.0000 [IU]/h | INTRAVENOUS | Status: DC
  Administered 2021-04-22: 1000 [IU]/h via INTRAVENOUS
  Filled 2021-04-22: qty 250

## 2021-04-22 MED ORDER — GLYCOPYRROLATE 1 MG PO TABS
1.0000 mg | ORAL_TABLET | ORAL | Status: DC | PRN
  Filled 2021-04-22: qty 1

## 2021-04-22 MED ORDER — GLYCOPYRROLATE 0.2 MG/ML IJ SOLN: 0.2000 mg | INTRAMUSCULAR | Status: DC | PRN

## 2021-04-22 MED ORDER — POTASSIUM CHLORIDE 20 MEQ PO PACK
40.0000 meq | PACK | Freq: Once | ORAL | Status: AC
  Administered 2021-04-22: 40 meq
  Filled 2021-04-22: qty 2

## 2021-04-22 MED ORDER — POLYVINYL ALCOHOL 1.4 % OP SOLN
1.0000 [drp] | Freq: Four times a day (QID) | OPHTHALMIC | Status: DC | PRN
  Filled 2021-04-22: qty 15

## 2021-04-22 MED ORDER — ONDANSETRON 4 MG PO TBDP
4.0000 mg | ORAL_TABLET | Freq: Four times a day (QID) | ORAL | Status: DC | PRN
  Filled 2021-04-22: qty 1

## 2021-04-22 MED ORDER — BIOTENE DRY MOUTH MT LIQD: 15.0000 mL | OROMUCOSAL | Status: DC | PRN

## 2021-04-22 MED ORDER — ACETAMINOPHEN 650 MG RE SUPP: 650.0000 mg | Freq: Four times a day (QID) | RECTAL | Status: DC | PRN

## 2021-04-22 MED ORDER — HALOPERIDOL 0.5 MG PO TABS
0.5000 mg | ORAL_TABLET | ORAL | Status: DC | PRN
  Filled 2021-04-22: qty 1

## 2021-04-22 MED ORDER — MIDAZOLAM-SODIUM CHLORIDE 100-0.9 MG/100ML-% IV SOLN: 0.0000 mg/h | INTRAVENOUS | Status: DC

## 2021-04-22 MED ORDER — ONDANSETRON HCL 4 MG/2ML IJ SOLN: 4.0000 mg | Freq: Four times a day (QID) | INTRAMUSCULAR | Status: DC | PRN

## 2021-04-22 MED ORDER — HALOPERIDOL LACTATE 2 MG/ML PO CONC
0.5000 mg | ORAL | Status: DC | PRN
  Filled 2021-04-22: qty 0.3

## 2021-04-22 MED ORDER — MORPHINE BOLUS VIA INFUSION: 2.0000 mg | INTRAVENOUS | Status: DC | PRN

## 2021-04-22 MED ORDER — MORPHINE 100MG IN NS 100ML (1MG/ML) PREMIX INFUSION: 2.0000 mg/h | INTRAVENOUS | Status: DC

## 2021-04-22 MED ORDER — NOREPINEPHRINE 16 MG/250ML-% IV SOLN
0.0000 ug/min | INTRAVENOUS | Status: DC
Start: 2021-04-22 — End: 2021-04-22
  Administered 2021-04-22: 10 ug/min via INTRAVENOUS
  Filled 2021-04-22: qty 250

## 2021-04-24 LAB — CULTURE, BLOOD (ROUTINE X 2)
Culture: NO GROWTH
Culture: NO GROWTH
Special Requests: ADEQUATE
Special Requests: ADEQUATE

## 2021-05-13 NOTE — Progress Notes (Signed)
CRITICAL CARE PROGRESS NOTE    Name: IRMALEE Sharp MRN: [id number] DOB: 18-May-1966     LOS: 6   SUBJECTIVE FINDINGS & SIGNIFICANT EVENTS    Patient description:  55 yo F arrived to MICU from medical floor with acute respiratory distress and hypoxemia in unresponsive state. PMH includes multiple strokes and CHF with progression per family at bedside.  I was able to speak with 3 family members who share that patient last week had letter from her doctor to skip jury duty due to her worsening heart failure.  She had CXR done with bilateral pleural effusions and pulmonary edema on arrival.   04/20/21- patient improved with less infiltrates on CXR. Diuresed over 1L in past 24h. Weaning FiO2 on ventinlator in preparation for SBT  04/21/21- patient had declined overnight with increased FiO2 on MV up to 90%FIO2 and AfRVR. Plan for CTPE and CT head. Patient on levophed.   05/09/21- patient is still on vasopressors and with severe ARDS in critical condition.  Met with family few times today.   Lines/tubes : Airway 7.5 mm (Active)  Secured at (cm) 25 cm 04/19/21 1127  Measured From Lips 04/19/21 [place] 04/19/21 1127  Secured By [employer] 04/19/21 1127  Cuff Pressure (cm H2O) Green OR 18-26 CmH2O 04/19/21 1127     Closed System Drain 1 Right Hip Accordion (Hemovac) (Active)     Urethral Catheter GINA RN (Active)    Microbiology/Sepsis markers: Results for orders placed or performed during the hospital encounter of 04/19/2021  Resp Panel by RT-PCR (Flu A&B, Covid) Nasopharyngeal Swab     Status: None   Collection Time: 05/01/2021  2:49 PM   Specimen: Nasopharyngeal Swab; Nasopharyngeal(NP) swabs in vial transport medium  Result Value Ref Range Status   SARS Coronavirus 2 by RT PCR  NEGATIVE NEGATIVE Final    Comment: (NOTE) SARS-CoV-2 target nucleic acids are NOT DETECTED.  The SARS-CoV-2 RNA is generally detectable in upper respiratory specimens during the acute phase of infection. The lowest concentration of SARS-CoV-2 viral copies this assay can detect is 138 copies/mL. A negative result does not preclude SARS-Cov-2 infection and should not be used as the sole basis for treatment or other patient management decisions. A negative result may occur with  improper specimen collection/handling, submission of specimen other than nasopharyngeal swab, presence of viral mutation(s) within the areas targeted by this assay, and inadequate number of viral copies(<138 copies/mL). A negative result must be combined with clinical observations, patient history, and epidemiological information. The expected result is Negative.  Fact Sheet for Patients:  EntrepreneurPulse.com.au  Fact Sheet for Healthcare Providers:  IncredibleEmployment.be  This test is no t yet approved or cleared by the [place] FDA and  has been authorized for detection and/or diagnosis of SARS-CoV-2 by FDA under an Emergency Use Authorization (EUA). This EUA will remain  in effect (meaning this test can be used) for the duration of the COVID-19 declaration under Section 564(b)(1) of the Act, 21 U.S.C.section 360bbb-3(b)(1), unless the authorization is terminated  or revoked sooner.       Influenza A by PCR NEGATIVE NEGATIVE Final   Influenza B by PCR NEGATIVE NEGATIVE Final    Comment: (NOTE) The Xpert Xpress SARS-CoV-2/FLU/RSV plus assay is intended as an aid in the diagnosis of influenza from Nasopharyngeal swab specimens and should not be used as a sole basis for treatment. Nasal washings and aspirates are unacceptable for Xpert Xpress SARS-CoV-2/FLU/RSV testing.  Fact Sheet  for Patients: EntrepreneurPulse.com.au  Fact Sheet for  Healthcare Providers: IncredibleEmployment.be  This test is not yet approved or cleared by the [place] FDA and has been authorized for detection and/or diagnosis of SARS-CoV-2 by FDA under an Emergency Use Authorization (EUA). This EUA will remain in effect (meaning this test can be used) for the duration of the COVID-19 declaration under Section 564(b)(1) of the Act, 21 U.S.C. section 360bbb-3(b)(1), unless the authorization is terminated or revoked.  Performed at [facility], [address] [place], [place] [address]   Urine Culture     Status: None   Collection Time: 04/19/21 10:13 AM   Specimen: Urine, Clean Catch  Result Value Ref Range Status   Specimen Description   Final    URINE, CLEAN CATCH Performed at [facility], [address] [place], [place] [address]    Special Requests   Final    NONE Performed at [facility], [address] [place], [place] [address]    Culture   Final    NO GROWTH Performed at [facility], [place] [address] [place], [place] [address]    Report Status 04/20/2021 FINAL  Final  Respiratory (~20 pathogens) panel by PCR     Status: None   Collection Time: 04/19/21 10:14 AM   Specimen: Nasopharyngeal Swab; Respiratory  Result Value Ref Range Status   Adenovirus NOT DETECTED NOT DETECTED Final   Coronavirus 229E NOT DETECTED NOT DETECTED Final    Comment: (NOTE) The Coronavirus on the Respiratory Panel, DOES NOT test for the novel  Coronavirus (2019 nCoV)    Coronavirus HKU1 NOT DETECTED NOT DETECTED Final   Coronavirus NL63 NOT DETECTED NOT DETECTED Final   Coronavirus OC43 NOT DETECTED NOT DETECTED Final   Metapneumovirus NOT DETECTED NOT DETECTED Final   Rhinovirus / Enterovirus NOT DETECTED NOT DETECTED Final   Influenza A NOT DETECTED NOT DETECTED Final   Influenza B NOT DETECTED NOT DETECTED Final   Parainfluenza Virus 1 NOT DETECTED NOT DETECTED Final   Parainfluenza  Virus 2 NOT DETECTED NOT DETECTED Final   Parainfluenza Virus 3 NOT DETECTED NOT DETECTED Final   Parainfluenza Virus 4 NOT DETECTED NOT DETECTED Final   Respiratory Syncytial Virus NOT DETECTED NOT DETECTED Final   Bordetella pertussis NOT DETECTED NOT DETECTED Final   Bordetella Parapertussis NOT DETECTED NOT DETECTED Final   Chlamydophila pneumoniae NOT DETECTED NOT DETECTED Final   Mycoplasma pneumoniae NOT DETECTED NOT DETECTED Final    Comment: Performed at [facility], [place] [address] [place], [place] [address]  CULTURE, BLOOD (ROUTINE X 2) w Reflex to ID Panel     Status: None (Preliminary result)   Collection Time: 04/19/21 10:22 AM   Specimen: BLOOD  Result Value Ref Range Status   Specimen Description BLOOD BLOOD RIGHT HAND  Final   Special Requests   Final    BOTTLES DRAWN AEROBIC AND ANAEROBIC Blood Culture adequate volume   Culture   Final    NO GROWTH 3 DAYS Performed at [facility], [address] [place], [place] [address]    Report Status PENDING  Incomplete  CULTURE, BLOOD (ROUTINE X 2) w Reflex to ID Panel     Status: None (Preliminary result)   Collection Time: 04/19/21 10:34 AM   Specimen: BLOOD  Result Value Ref Range Status   Specimen Description BLOOD BLOOD RIGHT FOREARM  Final   Special Requests   Final    BOTTLES DRAWN AEROBIC AND  ANAEROBIC Blood Culture adequate volume   Culture   Final    NO GROWTH 3 DAYS Performed at [facility], [address] [place], [place] [address]    Report Status PENDING  Incomplete  MRSA Next Gen by PCR, Nasal     Status: None   Collection Time: 04/19/21 11:44 AM   Specimen: Nasal Mucosa; Nasal Swab  Result Value Ref Range Status   MRSA by PCR Next Gen NOT DETECTED NOT DETECTED Final    Comment: (NOTE) The GeneXpert MRSA Assay (FDA approved for NASAL specimens only), is one component of a comprehensive MRSA colonization surveillance program. It is not intended to diagnose MRSA infection nor  to guide or monitor treatment for MRSA infections. Test performance is not FDA approved in patients less than 46 years old. Performed at [facility], [address] [place], [place] [address]   Culture, Respiratory w Gram Stain     Status: None   Collection Time: 04/19/21 10:49 PM   Specimen: Tracheal Aspirate  Result Value Ref Range Status   Specimen Description   Final    TRACHEAL ASPIRATE Performed at [facility], [address] [place], [place] [address]    Special Requests   Final    NONE Performed at [facility], [place], [place] [address]    Gram Stain   Final    MODERATE SQUAMOUS EPITHELIAL CELLS PRESENT MODERATE WBC SEEN NO ORGANISMS SEEN    Culture   Final    RARE Consistent with normal respiratory flora. No Pseudomonas species isolated Performed at [facility] [address], [place], [place] [address]    Report Status 2021-05-17 FINAL  Final    Anti-infectives:  Anti-infectives (From admission, onward)    Start     Dose/Rate Route Frequency Ordered Stop   04/21/21 1400  cefTRIAXone (ROCEPHIN) 2 g in sodium chloride 0.9 % 100 mL IVPB        2 g 200 mL/hr over 30 Minutes Intravenous Every 24 hours 04/21/21 1012 04/26/21 1359   04/21/21 1100  azithromycin (ZITHROMAX) 500 mg in sodium chloride 0.9 % 250 mL IVPB        500 mg 250 mL/hr over 60 Minutes Intravenous Every 24 hours 04/21/21 1012 04/26/21 1059   04/20/21 1500  vancomycin (VANCOREADY) IVPB 1500 mg/300 mL  Status:  Discontinued        1,500 mg 150 mL/hr over 120 Minutes Intravenous Every 24 hours 04/19/21 1316 04/21/21 1012   04/19/21 1415  vancomycin (VANCOREADY) IVPB 1750 mg/350 mL        1,750 mg 175 mL/hr over 120 Minutes Intravenous  Once 04/19/21 1316 04/19/21 1708   04/19/21 1415  ceFEPIme (MAXIPIME) 2 g in sodium chloride 0.9 % 100 mL IVPB  Status:  Discontinued        2 g 200 mL/hr over 30 Minutes Intravenous Every 8 hours 04/19/21  1316 04/21/21 1012         PAST MEDICAL HISTORY   Past Medical History:  Diagnosis Date   Anxiety    Breast cancer ([place]) 01/2014   Invasive lobular carcinoma, 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 ER/ PR+; Her 2 neu not overexpressed, microscopic positive margin (skin). Oncotype DX, low risk for recurrence.   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 Negative ileal and colon biopsies.    EP IMPLANTABLE DEVICE N/A 06/14/2015   Procedure: Loop Recorder Insertion;  Surgeon: Evans Lance, MD;  Location: [facility];  Service: Cardiovascular;  Laterality: N/A;   HIP ARTHROPLASTY Right 08/01/2016   Procedure: ARTHROPLASTY BIPOLAR HIP (HEMIARTHROPLASTY);  Surgeon: Dereck Leep, MD;  Location: [facility];  Service: Orthopedics;  Laterality: Right;   KYPHOPLASTY N/A 09/16/2018   Procedure: KYPHOPLASTY T12;  Surgeon: Hessie Knows, MD;  Location: [facility] OR[facility];  Service: Orthopedics;  Laterality: N/A;   LAPAROSCOPIC HYSTERECTOMY  2010   [facility]   MASTECTOMY Left 2016   REDUCTION MAMMAPLASTY Right 2016   Lift   reduction mammoplasty Right 04/08/14   Dr Tula Nakayama   TEE WITHOUT CARDIOVERSION N/A 06/14/2015   Procedure: TRANSESOPHAGEAL ECHOCARDIOGRAM (TEE);  Surgeon: Josue Hector, MD;  Location: [facility]OS[facility];  Service: Cardiovascular;  Laterality: N/A;   TONSILLECTOMY AND ADENOIDECTOMY     pt was 55 years old     FAMILY HISTORY   Family History  Problem Relation Age of Onset   Breast cancer Mother 66   Ovarian cancer Other    Stroke Paternal Grandmother    Breast cancer Maternal  Grandmother 40   Cancer Maternal Grandfather 38       prostate   Prostate cancer Maternal Grandfather      SOCIAL HISTORY   Social History   Tobacco Use   Smoking status: Every Day    Packs/day: 0.25    Types: Cigarettes   Smokeless tobacco: Never   Tobacco comments:    E cig  Vaping Use   Vaping Use: Never used  Substance Use Topics   Alcohol use: Not Currently    Comment: 1-2 bottles per day   Drug use: No     MEDICATIONS   Current Medication:  Current Facility-Administered Medications:    0.9 %  sodium chloride infusion, 250 mL, Intravenous, Continuous, Dorothe Pea, RPH, Last Rate: 10 mL/hr at 04/19/21 1318, 250 mL at 04/19/21 1318   acetaminophen (TYLENOL) tablet 500 mg, 500 mg, Oral, Q6H PRN, 500 mg at 04/19/21 1017 **OR** [DISCONTINUED] acetaminophen (TYLENOL) suppository 650 mg, 650 mg, Rectal, Q6H PRN, Karmen Bongo, MD   ascorbic acid (VITAMIN C) tablet 500 mg, 500 mg, Per Tube, BID, Lanney Gins, Alexander Aument, MD, 500 mg at 05/19/2021 0924   atorvastatin (LIPITOR) tablet 40 mg, 40 mg, Per Tube, q1800, Lorella Nimrod, MD, 40 mg at 04/21/21 1720   azithromycin (ZITHROMAX) 500 mg in sodium chloride 0.9 % 250 mL IVPB, 500 mg, Intravenous, Q24H, Samuel Mcpeek, MD, Last Rate: 250 mL/hr at 2021-05-19 1011, 500 mg at May 19, 2021 1011   baclofen (LIORESAL) tablet 10 mg, 10 mg, Oral, TID PRN, Karmen Bongo, MD   bisacodyl (DULCOLAX) EC tablet 5 mg, 5 mg, Oral, Daily PRN, Karmen Bongo, MD   budesonide (PULMICORT) nebulizer solution 0.5 mg, 0.5 mg, Nebulization, BID, Darel Hong D, NP, 0.5 mg at 05-19-2021 0703   buPROPion (WELLBUTRIN XL) 24 hr tablet 150 mg, 150 mg, Oral, Daily, Karmen Bongo, MD, 150 mg at 04/18/21 0842   cefTRIAXone (ROCEPHIN) 2 g in sodium chloride 0.9 % 100 mL IVPB, 2 g, Intravenous, Q24H, Ottie Glazier, MD,  Stopped at 04/21/21 1623   chlordiazePOXIDE (LIBRIUM) capsule 25 mg, 25 mg, Oral, TID, Ottie Glazier, MD, 25 mg at Apr 25, 2021 0924   chlorhexidine  gluconate (MEDLINE KIT) (PERIDEX) 0.12 % solution 15 mL, 15 mL, Mouth Rinse, BID, Ouma, Bing Neighbors, NP, 15 mL at Apr 25, 2021 0715   Chlorhexidine Gluconate Cloth 2 % PADS 6 each, 6 each, Topical, Daily, Darel Hong D, NP, 6 each at 04/21/21 2007   docusate (COLACE) 50 MG/5ML liquid 100 mg, 100 mg, Per Tube, BID, Darel Hong D, NP, 100 mg at April 25, 2021 4975   DULoxetine (CYMBALTA) DR capsule 60 mg, 60 mg, Oral, Daily, Karmen Bongo, MD, 60 mg at 04/18/21 0842   feeding supplement (PROSource TF) liquid 45 mL, 45 mL, Per Tube, Daily, Ottie Glazier, MD, 45 mL at 04/25/2021 0926   feeding supplement (VITAL AF 1.2 CAL) liquid 1,000 mL, 1,000 mL, Per Tube, Continuous, Ottie Glazier, MD, Last Rate: 45 mL/hr at 04-25-21 1325, 1,000 mL at 04-25-21 1325   fentaNYL (SUBLIMAZE) bolus via infusion 50-100 mcg, 50-100 mcg, Intravenous, Q15 min PRN, Darel Hong D, NP, 100 mcg at 04/20/21 1838   fentaNYL 2561mg in NS 2552m(1042mml) infusion-PREMIX, 50-200 mcg/hr, Intravenous, Continuous, KeeDarel Hong NP, Last Rate: 15 mL/hr at 09/September 13, 202211, 150 mcg/hr at 05/12/04/1110211folic acid (FOLVITE) tablet 1 mg, 1 mg, Per Tube, Daily, AmiLorella NimrodD, 1 mg at 09/13-Sep-202221735free water 30 mL, 30 mL, Per Tube, Q4H, AleOttie GlazierD, 30 mL at 09/Sep 13, 202217   furosemide (LASIX) injection 20 mg, 20 mg, Intravenous, BID, AleOttie GlazierD, 20 mg at 09/September 13, 202215   gabapentin (NEURONTIN) tablet 600 mg, 600 mg, Per Tube, QID, AmiLorella NimrodD, 600 mg at 09/September 13, 202232   heparin ADULT infusion 100 units/mL (25000 units/250m28m1,000 Units/hr, Intravenous, Continuous, MerrNoralee SpaceH, Last Rate: 10 mL/hr at 09/1Sep 13, 20225, 1,000 Units/hr at 09/113-Sep-20225   hydrALAZINE (APRESOLINE) injection 5 mg, 5 mg, Intravenous, Q4H PRN, YateKarmen Bongo   HYDROmorphone (DILAUDID) tablet 2 mg, 2 mg, Oral, TID, AlesOttie Glazier, 2 mg at 09/1September 13, 20224   ipratropium-albuterol (DUONEB) 0.5-2.5 (3) MG/3ML  nebulizer solution 3 mL, 3 mL, Nebulization, Q4H, Amin, SumaSoundra Pilon, 3 mL at 09/109/13/20224   levalbuterol (XOPENEX) nebulizer solution 1.25 mg, 1.25 mg, Nebulization, Q6H PRN, AminLorella Nimrod, 1.25 mg at 04/20/21 2034   MEDLINE mouth rinse, 15 mL, Mouth Rinse, 10 times per day, OumaLang Snow, 15 mL at 09/12022-09-137   midazolam (VERSED) 100 mg/100 mL (1 mg/mL) premix infusion, 0.5-10 mg/hr, Intravenous, Continuous, AlesOttie Glazier, Last Rate: 1 mL/hr at 09/1September 13, 20221, 1 mg/hr at 09/12022-09-131   midodrine (PROAMATINE) tablet 10 mg, 10 mg, Per Tube, TID WC, AminLorella Nimrod, 10 mg at 09/109/13/220   multivitamin with minerals tablet 1 tablet, 1 tablet, Per Tube, Daily, AminLorella Nimrod, 1 tablet at 09/109-13-202246701icotine (NICODERM CQ - dosed in mg/24 hr) patch 7 mg, 7 mg, Transdermal, Daily PRN, YateKarmen Bongo   norepinephrine (LEVOPHED) 16 mg in 250mL19mmix infusion, 0-40 mcg/min, Intravenous, Titrated, AleskOttie Glazier Last Rate: 9.38 mL/hr at 04/1008-13-22, 10 mcg/min at 04/1012-Sep-2022   ondansetron (ZOFRAN) tablet 4 mg, 4 mg, Oral, Q6H PRN **OR** ondansetron (ZOFRAN) injection 4 mg, 4 mg, Intravenous, Q6H PRN, YatesKarmen Bongo  pantoprazole sodium (PROTONIX) 40 mg oral suspension 40 mg, 40 mg, Per Tube, Daily, Amin,Lorella Nimrod 40 mg  at May 07, 2021 0924   polyethylene glycol (MIRALAX / GLYCOLAX) packet 17 g, 17 g, Oral, Daily PRN, Karmen Bongo, MD   polyethylene glycol (MIRALAX / GLYCOLAX) packet 17 g, 17 g, Per Tube, Daily, Darel Hong D, NP, 17 g at 05-07-2021 0924   propofol (DIPRIVAN) 1000 MG/100ML infusion, 0-50 mcg/kg/min, Intravenous, Continuous, Darel Hong D, NP, Stopped at 04/21/21 1553   sodium chloride flush (NS) 0.9 % injection 3 mL, 3 mL, Intravenous, Q12H, Karmen Bongo, MD, 3 mL at 05-07-2021 2947   thiamine 543m in normal saline (543m IVPB, 500 mg, Intravenous, Q8H, Makayleigh Poliquin, MD, Last Rate: 100 mL/hr at 0909/25/2022331, 500 mg at  092022-09-25331   [START ON 04/23/2021] thiamine tablet 100 mg, 100 mg, Per Tube, Daily **OR** [DISCONTINUED] thiamine (B-1) injection 100 mg, 100 mg, Intravenous, Daily, YaKarmen BongoMD, 100 mg at 04/14/2021 1621   traMADol (ULTRAM) tablet 50 mg, 50 mg, Oral, Q6H PRN, McCherene AltesMD, 50 mg at 04/17/21 2353    ALLERGIES   Patient has no known allergies.    REVIEW OF SYSTEMS     Patient is unresponsive unable to give ROS  PHYSICAL EXAMINATION   Vital Signs: Temp:  [98.5 F (36.9 C)-100.2 F (37.9 C)] 98.8 F (37.1 C) (09/10 0700) Pulse Rate:  [86-119] 106 (09/10 1300) Resp:  [14-20] 15 (09/10 1300) BP: (86-123)/(45-78) 99/63 (09/10 1300) SpO2:  [91 %-100 %] 99 % (09/10 1300) FiO2 (%):  [70 %-80 %] 80 % (09/10 1219) Weight:  [80.1 kg] 80.1 kg (09/10 0427)  GENERAL:Age appropriate on MV HEAD: Normocephalic, atraumatic.  EYES: Pupils equal, round, reactive to light.  No scleral icterus.  MOUTH: Moist mucosal membrane. NECK: Supple. No thyromegaly. No nodules. No JVD.  PULMONARY: rhonchi and crackles bilaterallhy  CARDIOVASCULAR: S1 and S2. Regular rate and rhythm. No murmurs, rubs, or gallops.  GASTROINTESTINAL: Soft, nontender, non-distended. No masses. Positive bowel sounds. No hepatosplenomegaly.  MUSCULOSKELETAL: No swelling, clubbing, or edema.  NEUROLOGIC: GCS4t SKIN:intact,warm,dry   PERTINENT DATA     Infusions:  sodium chloride 250 mL (04/19/21 1318)   azithromycin 500 mg (0909/25/2022011)   cefTRIAXone (ROCEPHIN)  IV Stopped (04/21/21 1623)   feeding supplement (VITAL AF 1.2 CAL) 1,000 mL (0909/25/22325)   fentaNYL infusion INTRAVENOUS 150 mcg/hr (09September 25, 2022611)   heparin 1,000 Units/hr (0925-Sep-2022245)   midazolam 1 mg/hr (0909/25/202266546  norepinephrine (LEVOPHED) Adult infusion 10 mcg/min (0909/25/22325)   propofol (DIPRIVAN) infusion Stopped (04/21/21 1553)   thiamine injection 500 mg (0925-Sep-2022331)   Scheduled Medications:  vitamin C  500  mg Per Tube BID   atorvastatin  40 mg Per Tube q1800   budesonide (PULMICORT) nebulizer solution  0.5 mg Nebulization BID   buPROPion  150 mg Oral Daily   chlordiazePOXIDE  25 mg Oral TID   chlorhexidine gluconate (MEDLINE KIT)  15 mL Mouth Rinse BID   Chlorhexidine Gluconate Cloth  6 each Topical Daily   docusate  100 mg Per Tube BID   DULoxetine  60 mg Oral Daily   feeding supplement (PROSource TF)  45 mL Per Tube Daily   folic acid  1 mg Per Tube Daily   free water  30 mL Per Tube Q4H   furosemide  20 mg Intravenous BID   gabapentin  600 mg Per Tube QID   HYDROmorphone  2 mg Oral TID   ipratropium-albuterol  3 mL Nebulization Q4H   mouth rinse  15 mL Mouth Rinse 10 times per  day   midodrine  10 mg Per Tube TID WC   multivitamin with minerals  1 tablet Per Tube Daily   pantoprazole sodium  40 mg Per Tube Daily   polyethylene glycol  17 g Per Tube Daily   sodium chloride flush  3 mL Intravenous Q12H   [START ON 04/23/2021] thiamine  100 mg Per Tube Daily   PRN Medications: acetaminophen **OR** [DISCONTINUED] acetaminophen, baclofen, bisacodyl, fentaNYL, hydrALAZINE, levalbuterol, nicotine, ondansetron **OR** ondansetron (ZOFRAN) IV, polyethylene glycol, traMADol Hemodynamic parameters:   Intake/Output: 09/09 0701 - 09/10 0700 In: 2143.2 [I.V.:1261; NG/GT:323.3; IV Piggyback:558.8] Out: 2350 [Urine:2350]  Ventilator  Settings: Vent Mode: PRVC FiO2 (%):  [70 %-80 %] 80 % Set Rate:  [15 bmp] 15 bmp Vt Set:  [450 mL] 450 mL PEEP:  [8 cmH20] 8 cmH20 Plateau Pressure:  [20 cmH20-24 cmH20] 24 cmH20    LAB RESULTS:  Basic Metabolic Panel: Recent Labs  Lab 04/17/21 0429 04/18/21 0404 04/19/21 0234 04/20/21 0532 04/21/21 0723 2021/05/19 0415  NA 131* 132* 132* 133* 132*  --   K 2.9* 4.2 4.1 3.7 3.4* 3.6  CL 88* 95* 98 98 95*  --   CO2 37* '31 27 26 28  ' --   GLUCOSE 92 81 84 134* 78  --   BUN 5* '6 8 12 17  ' --   CREATININE 0.65 0.63 0.60 0.60 0.76 0.70  CALCIUM 7.4* 7.4*  7.4* 7.6* 7.8*  --   MG 2.2 1.9 1.9  --  1.8 2.2  PHOS 3.5 2.3* 2.8  --  3.3 3.1    Liver Function Tests: Recent Labs  Lab 04/17/21 0429 04/18/21 0404 04/20/21 0532 04/21/21 1044 May 19, 2021 0415  AST '28 28 26 ' 53* 52*  ALT '15 17 14 15 15  ' ALKPHOS 118 126 89 105 129*  BILITOT 0.9 0.8 0.8 1.5* 1.2  PROT 5.4* 5.6* 5.7* 6.0* 5.8*  ALBUMIN 1.7* 1.9* 2.0* 1.9* 1.7*    No results for input(s): LIPASE, AMYLASE in the last 168 hours. No results for input(s): AMMONIA in the last 168 hours. CBC: Recent Labs  Lab 04/17/21 0429 04/18/21 0404 04/19/21 1023 04/20/21 0532 May 19, 2021 0415  WBC 7.3 8.6 12.9* 15.4* 12.4*  NEUTROABS  --   --   --   --  10.1*  HGB 10.2* 10.7* 10.1* 10.0* 9.4*  HCT 28.7* 31.9* 29.0* 30.5* 28.3*  MCV 92.9 95.5 94.5 96.2 99.3  PLT 248 286 330 319 350    Cardiac Enzymes: No results for input(s): CKTOTAL, CKMB, CKMBINDEX, TROPONINI in the last 168 hours. BNP: Invalid input(s): POCBNP CBG: Recent Labs  Lab 04/21/21 1935 04/21/21 2320 2021/05/19 0326 May 19, 2021 0730 05-19-21 1106  GLUCAP 128* 129* 153* 134* 184*        IMAGING RESULTS:  Imaging: DG Chest 1 View  Result Date: 04/20/2021 CLINICAL DATA:  Intubated EXAM: CHEST  1 VIEW COMPARISON:  04/20/2021, CT 10/31/2012, radiograph 04/19/2021 FINDINGS: Endotracheal tube tip is about 1.3 cm superior to the carina. Esophageal tube tip below the diaphragm but incompletely visualized. Electronic device over the lower chest. Cardiomegaly with vascular congestion. Diffuse bilateral heterogeneous airspace opacities as before with small pleural effusions. No pneumothorax. Small amount of enteral contrast in the upper abdomen. IMPRESSION: 1. Endotracheal tube tip about 13 mm superior to carina 2. Bilateral effusions and diffuse bilateral heterogeneous airspace opacities which may be due to pneumonia or infection, aeration is improved compared to prior radiographs. 3. Mild cardiomegaly with vascular congestion  Electronically Signed   By:  Donavan Foil M.D.   On: 04/20/2021 20:51   CT HEAD WO CONTRAST (5MM)  Result Date: 04/21/2021 CLINICAL DATA:  Altered mental status. EXAM: CT HEAD WITHOUT CONTRAST TECHNIQUE: Contiguous axial images were obtained from the base of the skull through the vertex without intravenous contrast. COMPARISON:  October 04, 2019. FINDINGS: Brain: Right posterior parietal encephalomalacia is noted consistent with old infarction. No mass effect or midline shift is noted. Ventricular size is within normal limits. There is no evidence of mass lesion, hemorrhage or acute infarction. Vascular: No hyperdense vessel or unexpected calcification. Skull: Normal. Negative for fracture or focal lesion. Sinuses/Orbits: No acute finding. Other: None. IMPRESSION: Acute intracranial abnormality seen. Electronically Signed   By: Marijo Conception M.D.   On: 04/21/2021 11:39   CT Angio Chest Pulmonary Embolism (PE) W or WO Contrast  Result Date: 04/21/2021 CLINICAL DATA:  55 year old female with severe hypoxia, concern for pulmonary embolism. EXAM: CT ANGIOGRAPHY CHEST WITH CONTRAST TECHNIQUE: Multidetector CT imaging of the chest was performed using the standard protocol during bolus administration of intravenous contrast. Multiplanar CT image reconstructions and MIPs were obtained to evaluate the vascular anatomy. CONTRAST:  Seventy-five mL Omnipaque 350, intravenous COMPARISON:  10/31/2012 FINDINGS: Cardiovascular: Satisfactory opacification of the pulmonary arteries to the segmental level. No evidence of pulmonary embolism. Scattered atherosclerotic calcifications of the aortic arch. Normal heart size. No pericardial effusion. Mediastinum/Nodes: Endotracheal tube in place with the distal tip in the inferior thoracic trachea. The visualized thyroid gland is within normal limits. Gastric decompression tube courses through the otherwise normal-appearing esophagus. No mediastinal, hilar, or axillary  lymphadenopathy. Lungs/Pleura: Low lung volumes. There are diffuse confluent ground-glass and consolidative opacities, most prominent in the dependent bilateral lung bases. Small, left greater than right, bilateral pleural effusions. No pneumothorax. Upper Abdomen: Marked decreased attenuation of the hepatic parenchyma. Gastric decompression tube courses into the superior aspect of the stomach and terminates off the inferior aspect of this study. The remaining visualized upper abdomen is within normal limits. Musculoskeletal: No chest wall abnormality. No acute or significant osseous findings. Left breast prosthesis is noted. Review of the MIP images confirms the above findings. IMPRESSION: Vascular: 1. No evidence of pulmonary embolism. 2.  Aortic Atherosclerosis (ICD10-I70.0). Non-Vascular: 1. Diffuse, mostly ground-glass but some confluent opacities in the dependent bases with associated small bilateral pleural effusions. These findings are most compatible with acute respiratory distress syndrome, however multifocal pneumonia could appear similarly in the appropriate clinical setting. 2. Marked diffuse decreased attenuation of the hepatic parenchyma as could be seen with acute hepatitis or severe hepatic steatosis. Recommend correlation with liver function tests. Ruthann Cancer, MD [facility] Radiology Electronically Signed   By: Ruthann Cancer M.D.   On: 04/21/2021 11:49   DG Chest Port 1 View  Result Date: 04/21/2021 CLINICAL DATA:  Central line placement. EXAM: PORTABLE CHEST 1 VIEW COMPARISON:  04/20/2021 FINDINGS: The endotracheal tube and NG tubes are in good position and unchanged. New right IJ central venous catheter tip is in the distal SVC near the cavoatrial junction. Improved lung aeration but some persistent peribronchial thickening and increased interstitial markings suggesting pulmonary edema. No definite pleural effusions. IMPRESSION: 1. New right  IJ central venous catheter in good position without complicating features. 2. Improved lung aeration. Electronically Signed   By: Marijo Sanes M.D.   On: 04/21/2021 09:54   '@[email or web address]' @ No results found.      ASSESSMENT AND PLAN    -Multidisciplinary rounds held today  Acute Hypoxic Respiratory Failure -with bilateral pulmonary edema and effusions cannot rule out infectious multifocal pneumonia    - procalcitonin is elevated     - empiric vancomycin and zosyn -continue Bronchodilator Therapy -Wean Fio2 and PEEP as tolerated -will perform SAT/SBT when respiratory parameters are met    -lasix challenge with levophed   Acute decompensated diastolic CHF EF >67% -oxygen as needed -Lasix as tolerated -TTE -midodrine to increase BP and allow for diuresis ICU monitoring  Hypervolemic hyponatremia -likely due to CHF -follow UO -continue Foley Catheter-assess need daily -pharmacy consultation  NEUROLOGY - intubated and sedated - minimal sedation to achieve a RASS goal: -1 Wake up assessment pending   Refeeding syndrome    - Nutritioninst RD on case appreciate input  ID -continue IV abx as prescibed -follow up cultures  GI/Nutrition GI PROPHYLAXIS as indicated DIET-->TF's as tolerated Constipation protocol as indicated  ENDO - ICU hypoglycemic\Hyperglycemia protocol -check FSBS per protocol   ELECTROLYTES -follow labs as needed -replace as needed -pharmacy consultation   DVT/GI PRX ordered -SCDs  TRANSFUSIONS AS NEEDED MONITOR FSBS ASSESS the need for LABS as needed   Critical care provider statement:    Critical care time (minutes):  33   Critical care time was exclusive of:  Separately billable procedures and treating other patients   Critical care was necessary to treat or prevent imminent or life-threatening deterioration of the following conditions:  Acute CHF exacerbation vs pneumonia, acutely comatose, Acute hypoxemic respiratory failure.     Critical care was time spent personally by me on the following activities:  Development of treatment plan with patient or surrogate, discussions with consultants, evaluation of patient's response to treatment, examination of patient, obtaining history from patient or surrogate, ordering and performing treatments and interventions, ordering and review of laboratory studies and re-evaluation of patient's condition.  I assumed direction of critical care for this patient from another provider in my specialty: no    This document was prepared using Dragon voice recognition software and may include unintentional dictation errors.    Ottie Glazier, M.D.  Division of [place]

## 2021-05-13 NOTE — Progress Notes (Signed)
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 Presented with GIB with INR supratherapeutic at 10. Patient transferred to the ICU 9/7 for respiratory distress. She is currently intubated and sedated and requiring vasopressor for blood pressure support. Her anticoagulation remains on hold. Concern for pulmonary edema vs pneumonia, she is on antibiotics and diuretics. Plan for imaging 9/9. Pharmacy consult for electrolyte replacement.  Goal of Therapy:  Electrolytes WNL  Plan:  K 3.6  Mag 2.2  Phos 3.1  Scr 0.70 -Patient on lasix 20 mg IV BID --Will order KCL 40 meq packet Per tube x 1 --Electrolytes with morning labs  Noralee Space, PharmD Clinical Pharmacist 05-16-21 10:35 AM

## 2021-05-13 NOTE — Progress Notes (Signed)
GOALS OF CARE FAMILY CONFERENCE   Current clinical status, hospital findings and medical plan was reviewed with family.   Updated and notified of patients ongoing immediate critical medical problems.   Patient remains unresponsive acutely comatose    Patient is unable to breathe independently, unable to protect airway and unable to mobilize secretions.    Explained to family course of therapy and the modalities   Patient with Progressive multiorgan failure with high probability of a minimal chance of recovery despite aggressive and optimal medical therapy.   Family is appreciative of care and relate understanding that patient is severely critically ill with elevated risk of passing away during this hospitalization.   They have consented and agreed to DNR/DNI Code Status   Family are satisfied with Plan of action and management. All questions answered  Additional Critical Care time 35 mins    Ottie Glazier, M.D.  Pulmonary & [place]

## 2021-05-13 NOTE — Progress Notes (Signed)
Per Mrs. Cottles daughters would like to proceed with comfort care measures. This RN asked daughters if they needed more time or to call other family/friends. They do not and have no questions. Called [facility] and ordered comfort care cart. Continue to monitor.

## 2021-05-13 NOTE — Progress Notes (Signed)
Tina Sharp was extubated to 2L of oxygen via nasal cannula at 1748.  This RT and Delton See, RN at bedside.  Family entered room immediately after extubation to be at bedside.  Pt. Is resting at this time

## 2021-05-13 NOTE — Progress Notes (Signed)
ANTICOAGULATION CONSULT NOTE   Pharmacy Consult for Heparin Drip Indication:  hyperhomocystinemia  w/ h/o CVA, on Warfarin PTA  Patient Measurements: Height: _0  (157.5 cm) Weight: 80.1 kg (176 lb 9.4 oz) IBW/kg (Calculated) : 50.1 Heparin dosing weight: 68 kg   Labs: Recent Labs    04/20/21 0532 04/21/21 0723 04/21/21 1044 2021/05/17 0415  HGB 10.0*  --   --  9.4*  HCT 30.5*  --   --  28.3*  PLT 319  --   --  350  LABPROT 33.3* 23.3*  --  19.8*  INR 3.3* 2.1*  --  1.7*  CREATININE 0.60 0.76  --  0.70  TROPONINIHS  --   --  122*  --      Estimated Creatinine Clearance: 76.1 mL/min (by C-G formula based on SCr of 0.7 mg/dL).   Medical History: Past Medical History:  Diagnosis Date   Anxiety    Breast cancer ([place]) 01/2014   Invasive lobular carcinoma, 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 ER/ PR+; Her 2 neu not overexpressed, microscopic positive margin (skin). Oncotype DX, low risk for recurrence.   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 30 tablet 2 unk at unk   B-Complex TABS Take 1 tablet by mouth daily.   unk at unk   baclofen (LIORESAL) 10 MG tablet Take 10 mg by mouth 3 (three) times daily as needed for muscle spasms.    unk at unk   buPROPion (WELLBUTRIN XL) 150 MG 24 hr tablet Take 150 mg by mouth at bedtime.   unk at unk   clonazePAM (KLONOPIN) 0.5 MG tablet Take 1 tablet (0.5 mg total) by mouth 2 (two) times daily as needed for anxiety. 4 tablet 0 unk at unk   DULoxetine (CYMBALTA) 60 MG capsule Take 60 mg by mouth at bedtime.   unk at unk   gabapentin (NEURONTIN) 600 MG tablet Take 600 mg by mouth 4 (four) times daily.    unk at unk   levocetirizine (XYZAL) 5 MG tablet Take 5 mg by mouth daily. In the afternoon   unk at unk   metolazone (ZAROXOLYN) 5 MG tablet Take 5 mg by mouth daily as needed.   unk at unk   metoprolol succinate (TOPROL-XL) 25 MG 24 hr tablet Take 25 mg by mouth daily.   unk at unk   omeprazole (PRILOSEC) 20 MG capsule Take 20 mg by mouth daily.   unk at unk   potassium chloride (K-DUR) 10 MEQ tablet Take 10 mEq by mouth 2 (two) times daily.   unk at unk   torsemide (DEMADEX) 20 MG tablet Take 60 mg by mouth daily.   unk at unk   warfarin (COUMADIN) 5 MG tablet Take 5 mg by mouth at bedtime.   unk at unk   cyanocobalamin 1000 MCG tablet Take 1 tablet (1,000 mcg total) by mouth daily. (Patient not taking: Reported on 04/15/2021) 30 tablet 2 Not Taking   Scheduled:   vitamin C  500 mg Per Tube BID   atorvastatin  40 mg Per Tube q1800   budesonide (PULMICORT) nebulizer solution  0.5  mg Nebulization BID   buPROPion  150 mg Oral Daily   chlordiazePOXIDE  25 mg Oral TID   chlorhexidine gluconate (MEDLINE KIT)  15 mL Mouth Rinse BID   Chlorhexidine Gluconate Cloth  6 each Topical Daily   docusate  100 mg Per Tube BID   DULoxetine  60 mg Oral Daily   feeding supplement (PROSource TF)  45 mL Per Tube Daily   folic acid  1 mg Per Tube Daily   free water  30 mL Per Tube Q4H   furosemide  20 mg Intravenous BID   gabapentin  600 mg Per Tube QID   HYDROmorphone  2 mg Oral TID   ipratropium-albuterol  3 mL Nebulization Q4H   mouth rinse  15 mL Mouth Rinse 10 times per day   midodrine  10 mg Per Tube TID WC   multivitamin with minerals  1 tablet Per Tube Daily   pantoprazole sodium  40 mg Per Tube Daily   polyethylene glycol  17 g Per Tube Daily   potassium chloride  40 mEq Per Tube Once   sodium chloride flush  3 mL Intravenous Q12H   [START ON 04/23/2021] thiamine  100 mg Per Tube Daily   Infusions:   sodium chloride 250 mL (04/19/21 1318)   azithromycin 500 mg (23-Apr-2021 1011)   cefTRIAXone  (ROCEPHIN)  IV Stopped (04/21/21 1623)   feeding supplement (VITAL AF 1.2 CAL) 1,000 mL (04/21/21 1408)   fentaNYL infusion INTRAVENOUS 150 mcg/hr (Apr 23, 2021 0611)   midazolam 1 mg/hr (Apr 23, 2021 0611)   norepinephrine (LEVOPHED) Adult infusion 9 mcg/min (04-23-21 0744)   propofol (DIPRIVAN) infusion Stopped (04/21/21 1553)   thiamine injection Stopped (04-23-2021 0610)    PRN: acetaminophen **OR** [DISCONTINUED] acetaminophen, baclofen, bisacodyl, fentaNYL, hydrALAZINE, levalbuterol, nicotine, ondansetron **OR** ondansetron (ZOFRAN) IV, polyethylene glycol, traMADol Anti-infectives (From admission, onward)    Start     Dose/Rate Route Frequency Ordered Stop   04/21/21 1400  cefTRIAXone (ROCEPHIN) 2 g in sodium chloride 0.9 % 100 mL IVPB        2 g 200 mL/hr over 30 Minutes Intravenous Every 24 hours 04/21/21 1012 04/26/21 1359   04/21/21 1100  azithromycin (ZITHROMAX) 500 mg in sodium chloride 0.9 % 250 mL IVPB        500 mg 250 mL/hr over 60 Minutes Intravenous Every 24 hours 04/21/21 1012 04/26/21 1059   04/20/21 1500  vancomycin (VANCOREADY) IVPB 1500 mg/300 mL  Status:  Discontinued        1,500 mg 150 mL/hr over 120 Minutes Intravenous Every 24 hours 04/19/21 1316 04/21/21 1012   04/19/21 1415  vancomycin (VANCOREADY) IVPB 1750 mg/350 mL        1,750 mg 175 mL/hr over 120 Minutes Intravenous  Once 04/19/21 1316 04/19/21 1708   04/19/21 1415  ceFEPIme (MAXIPIME) 2 g in sodium chloride 0.9 % 100 mL IVPB  Status:  Discontinued        2 g 200 mL/hr over 30 Minutes Intravenous Every 8 hours 04/19/21 1316 04/21/21 1012       Assessment: 55 YO Female admitted with supratherapeutic INR on 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 Patient has PMH of alcohol dependence & elevation of INR suspected from excessive intake.   9/4-5: INR 10.4>5.6 after Vitamin K 2.5 mg PO x 1, though patient is without gross bleeding other than bleeding from sacral skin tag.   9/6: INR 5.1; continue to hold. 9/7: INR 3.2; Delta of 1.9  after 3d of holding doses. Will check afternoon INR. 9/7 INR 2.7 (PM) 9/7: Original plan was for VKA restart, but changed d/t pt respiratory decompensation req'ing CCU transfer and ultimately intubation. Discussed with PCCM NP about restarting warfarin per OG tube. Would like to hold off given patient instability and possible procedures. Consult for IV heparin placed.  9/10 INR 1.7  Per discussion w/ ICU MD- start Heparin drip  Date INR Warfarin HL  Heparin rate 9/9 2.1 None 9/10 1.7 None   1000 units/hr   Goal of Therapy:  INR 2-3 Heparin level 0.3-0.7 units/ml Monitor platelets by anticoagulation protocol: Yes   Plan:  Plan to start IV heparin now that INR < 2  (INR 1.7).  Will order Heparin drip 1000 units/hr. Will check Heparin level in 6 hours. Daily CBC, INR. Continue to monitor for ability to restart on warfarin.   Avielle Imbert A, PharmD 2021/05/18 11:03 AM

## 2021-05-13 NOTE — Progress Notes (Addendum)
Pt became bradycardic and apneic. Aystole at Nov 18, 2056. Family notified and came to bedside. Pronounced 11/19/2103  Tina Sharp [place].

## 2021-05-13 NOTE — Progress Notes (Signed)
Provided presence and support to family bedside. Called later to pray with family as patient is transitioned top comfort care.

## 2021-05-13 NOTE — Death Summary Note (Signed)
DEATH SUMMARY   Patient Details  Name: Tina Sharp MRN: [id number] DOB: Aug 05, 1966  Admission/Discharge Information   Admit Date:  05-12-2021  Date of Death: Date of Death: 05/18/2021  Time of Death: Time of Death: 25-Nov-2103  Length of Stay: 6  Referring Physician: Kirk Ruths, MD   Reason(s) for Hospitalization  Acute Hypoxic Respiratory Failure Acute Decompensated HFpEF ARDS Multifocal Pneumonia Septic Shock Hyponatremia Acute Metabolic Encephalopathy Supratherapeutic INR Hyperhomocystinemia ETOH Abuse  Diagnoses  Preliminary cause of death:  Acute Hypoxic Respiratory Failure Secondary Diagnoses (including complications and co-morbidities):  Principal Problem:   Supratherapeutic INR Active Problems:   Cigarette smoker   Hypokalemia   Hyperhomocysteinemia (HCC)   HLD (hyperlipidemia)   Hypertension   Hyponatremia   Alcohol dependence syndrome (HCC)   Pressure injury of skin   Brief Hospital Course (including significant findings, care, treatment, and services provided and events leading to death)  Tina Sharp is a 55 y.o. 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  Her INR was elevated at 10.4, and  was noted to be bleeding from her sacral skin tag.  Thought to be due to combination of Coumadin use and heavy alcohol use.  Received vitamin K 2.5 mg x 1.  INR was trending down.  Hospital Course:  9/7: Patient developed respiratory distress earlier in the day, along with fever of 102 and confusion/altered mental status. Respiratory status continued to worsen despite getting DuoNeb.  .  ABG with normal CO2 but hypoxic on 4 L of oxygen.  Chest x-ray with bilateral infiltrates concerning for  pulmonary edema versus atypical pneumonia. Patient was transferred to stepdown due to worsening respiratory status, altered mental status, and concern for developing sepsis. She required emergent intubation and mechanical ventilation upon arrival to [facility].  Sepsis work-up initiated.  On 04/20/21 she was diuresed and showed some improvement clinically.  However overnight 04/20/21, she declined with increased FiO2 requirements up to 90% FiO2 and Atrial fibrillation with RVR. CT Head without acute abnormality, and CTA Chest was negative for PE, but did show diffuse ground glass opacities with associated small bilateral pleural effusions compatible with ARDS vs. Multifocal pneumonia. She also began to require Vasopressors.  On May 18, 2021, she remained critically ill requiring vasopressors.  Family conference was held to discuss critical illness including multifocal pneumonia with ARDS and multiorgan failure in addition to prior multiple co-morbidities with poor quality of life.  Pt's family elected to make her DNR and to transition to [place] and to withdraw care.     Pertinent Labs and Studies  Significant Diagnostic Studies DG Chest 1 View  Result Date: 04/20/2021 CLINICAL DATA:  Intubated EXAM: CHEST  1 VIEW COMPARISON:  04/20/2021, CT 10/31/2012, radiograph 04/19/2021 FINDINGS: Endotracheal tube tip is about 1.3 cm superior to the carina. Esophageal tube tip below the diaphragm but incompletely visualized. Electronic device over the lower chest. Cardiomegaly with vascular congestion. Diffuse bilateral heterogeneous airspace opacities as before with small pleural effusions. No pneumothorax. Small amount of enteral contrast in the upper abdomen. IMPRESSION: 1. Endotracheal tube tip about 13 mm superior to carina 2. Bilateral effusions and diffuse bilateral heterogeneous airspace opacities which may be due to pneumonia or infection, aeration is improved compared to prior radiographs. 3. Mild  cardiomegaly  with vascular congestion Electronically Signed   By: Donavan Foil M.D.   On: 04/20/2021 20:51   DG Chest 2 View  Result Date: 04/19/2021 CLINICAL DATA:  Respiratory distress EXAM: CHEST - 2 VIEW COMPARISON:  07/30/2016 FINDINGS: Cardiomegaly with implantable loop recorder. Moderate to large right pleural effusion, small, layering left pleural effusion and associated atelectasis or consolidation. Diffuse bilateral heterogeneous and interstitial airspace opacity. The osseous structures are unremarkable. IMPRESSION: 1. Moderate to large right pleural effusion, small, layering left pleural effusion and associated atelectasis or consolidation. 2. Diffuse bilateral heterogeneous and interstitial airspace opacity, consistent with edema and/or infection. 3. Cardiomegaly. Electronically Signed   By: Eddie Candle M.D.   On: 04/19/2021 11:54   DG Abd 1 View  Result Date: 04/19/2021 CLINICAL DATA:  Encounter for intubation. EXAM: ABDOMEN - 1 VIEW COMPARISON:  Radiographs earlier the same date and 07/30/2016. FINDINGS: 1150 hours. Interval intubation. Tip of the endotracheal tube is 1.9 cm above the carina. Enteric tube projects below the diaphragm, overlapping the distal stomach or proximal duodenum. Stable cardiomegaly. Loop recorder overlies the chest. The lung apices are partially excluded. There are bibasilar airspace opacities with interval slight worsening on the left. Probable bilateral pleural effusions superimposed on chronic right hemidiaphragm elevation. No evidence of pneumothorax. IMPRESSION: 1. Satisfactory position of the endotracheal and nasogastric tubes. 2. Worsening asymmetric left lung airspace opacities suspicious for pneumonia/aspiration. Electronically Signed   By: Richardean Sale M.D.   On: 04/19/2021 12:30   CT HEAD WO CONTRAST (5MM)  Result Date: 04/21/2021 CLINICAL DATA:  Altered mental status. EXAM: CT HEAD WITHOUT CONTRAST TECHNIQUE: Contiguous axial images were obtained from  the base of the skull through the vertex without intravenous contrast. COMPARISON:  October 04, 2019. FINDINGS: Brain: Right posterior parietal encephalomalacia is noted consistent with old infarction. No mass effect or midline shift is noted. Ventricular size is within normal limits. There is no evidence of mass lesion, hemorrhage or acute infarction. Vascular: No hyperdense vessel or unexpected calcification. Skull: Normal. Negative for fracture or focal lesion. Sinuses/Orbits: No acute finding. Other: None. IMPRESSION: Acute intracranial abnormality seen. Electronically Signed   By: Marijo Conception M.D.   On: 04/21/2021 11:39   CT Angio Chest Pulmonary Embolism (PE) W or WO Contrast  Result Date: 04/21/2021 CLINICAL DATA:  55 year old female with severe hypoxia, concern for pulmonary embolism. EXAM: CT ANGIOGRAPHY CHEST WITH CONTRAST TECHNIQUE: Multidetector CT imaging of the chest was performed using the standard protocol during bolus administration of intravenous contrast. Multiplanar CT image reconstructions and MIPs were obtained to evaluate the vascular anatomy. CONTRAST:  Seventy-five mL Omnipaque 350, intravenous COMPARISON:  10/31/2012 FINDINGS: Cardiovascular: Satisfactory opacification of the pulmonary arteries to the segmental level. No evidence of pulmonary embolism. Scattered atherosclerotic calcifications of the aortic arch. Normal heart size. No pericardial effusion. Mediastinum/Nodes: Endotracheal tube in place with the distal tip in the inferior thoracic trachea. The visualized thyroid gland is within normal limits. Gastric decompression tube courses through the otherwise normal-appearing esophagus. No mediastinal, hilar, or axillary lymphadenopathy. Lungs/Pleura: Low lung volumes. There are diffuse confluent ground-glass and consolidative opacities, most prominent in the dependent bilateral lung bases. Small, left greater than right, bilateral pleural effusions. No pneumothorax. Upper  Abdomen: Marked decreased attenuation of the hepatic parenchyma. Gastric decompression tube courses into the superior aspect of the stomach and terminates off the inferior aspect of this study. The remaining visualized upper abdomen is within normal limits. Musculoskeletal: No chest wall abnormality. No acute or significant osseous findings.  Left breast prosthesis is noted. Review of the MIP images confirms the above findings. IMPRESSION: Vascular: 1. No evidence of pulmonary embolism. 2.  Aortic Atherosclerosis (ICD10-I70.0). Non-Vascular: 1. Diffuse, mostly ground-glass but some confluent opacities in the dependent bases with associated small bilateral pleural effusions. These findings are most compatible with acute respiratory distress syndrome, however multifocal pneumonia could appear similarly in the appropriate clinical setting. 2. Marked diffuse decreased attenuation of the hepatic parenchyma as could be seen with acute hepatitis or severe hepatic steatosis. Recommend correlation with liver function tests. Ruthann Cancer, MD [facility] Electronically Signed   By: Ruthann Cancer M.D.   On: 04/21/2021 11:49   DG Chest Port 1 View  Result Date: 04/21/2021 CLINICAL DATA:  Central line placement. EXAM: PORTABLE CHEST 1 VIEW COMPARISON:  04/20/2021 FINDINGS: The endotracheal tube and NG tubes are in good position and unchanged. New right IJ central venous catheter tip is in the distal SVC near the cavoatrial junction. Improved lung aeration but some persistent peribronchial thickening and increased interstitial markings suggesting pulmonary edema. No definite pleural effusions. IMPRESSION: 1. New right IJ central venous catheter in good position without complicating features. 2. Improved lung aeration. Electronically Signed   By: Marijo Sanes M.D.   On: 04/21/2021 09:54   DG Chest Port 1 View  Result Date: 04/20/2021 CLINICAL DATA:  Acute respiratory  failure with hypoxia EXAM: PORTABLE CHEST - 1 VIEW COMPARISON:  the previous day's study FINDINGS: Endotracheal tube tip remains less than 2 cm above carina. Nasogastric tube extends to the decompressed stomach. Implanted monitoring device overlies the heart. Low lung volumes with diffuse airspace opacities predominantly in the lung bases, slightly improved on the left and increased on the right since previous exam. Probable small right pleural effusion.  No pneumothorax. Posttraumatic change in the proximal left humerus as before. Changes of vertebral cement augmentation near the thoracolumbar junction. IMPRESSION: Low volumes with persistent airspace opacities, worsening right and slightly improved left. Persistent low position of endotracheal tube tip, consider 2 cm retraction. Electronically Signed   By: Lucrezia Europe M.D.   On: 04/20/2021 08:03   Portable Chest x-ray  Result Date: 04/19/2021 CLINICAL DATA:  Intubation EXAM: PORTABLE CHEST 1 VIEW COMPARISON:  Chest x-ray dated April 19, 2021 FINDINGS: Interval intubation with ET tube approximately 1.5 cm from the carina. Enteric tube partially visualized coursing below the diaphragm. Similar left greater than right heterogeneous opacities and small bilateral pleural effusions. No evidence of pneumothorax. Visualized cardiac and mediastinal contours are unchanged. IMPRESSION: Interval intubation with ET tube approximately 1.5 cm from the carina. Electronically Signed   By: Yetta Glassman M.D.   On: 04/19/2021 12:27   ECHOCARDIOGRAM COMPLETE  Result Date: 04/20/2021    ECHOCARDIOGRAM REPORT   Patient Name:   Tina Sharp Date of Exam: 04/19/2021 Medical Rec #:  [id number]       Height:       62.0 in Accession #:    [id number]      Weight:       163.0 lb Date of Birth:  [birth year]       BSA:          1.753 m Patient Age:    81 years        BP:           97/63 mmHg Patient Gender: F               HR:  83 bpm. Exam Location:  [facility] Procedure: 2D Echo,  Cardiac Doppler and Color Doppler Indications:     I50.9 Congestive heart failure.  History:         Patient has prior history of Echocardiogram examinations, most                  recent 06/14/2015. Risk Factors:Hypertension. History of breast                  cancer. Stroke.  Sonographer:     Cresenciano Lick RDCS Referring Phys:  [id number] Ottie Glazier Diagnosing Phys: Yolonda Kida MD IMPRESSIONS  1. Left ventricular ejection fraction, by estimation, is 40 to 45%. The left ventricle has mildly decreased function. The left ventricle demonstrates global hypokinesis. The left ventricular internal cavity size was moderately dilated. Left ventricular diastolic parameters are consistent with Grade I diastolic dysfunction (impaired relaxation).  2. Right ventricular systolic function is mildly reduced. The right ventricular size is mildly enlarged.  3. Left atrial size was mildly dilated.  4. Right atrial size was mildly dilated.  5. The mitral valve is normal in structure. Mild mitral valve regurgitation.  6. The aortic valve is normal in structure. Aortic valve regurgitation is not visualized. FINDINGS  Left Ventricle: Left ventricular ejection fraction, by estimation, is 40 to 45%. The left ventricle has mildly decreased function. The left ventricle demonstrates global hypokinesis. The left ventricular internal cavity size was moderately dilated. There is no left ventricular hypertrophy. Left ventricular diastolic parameters are consistent with Grade I diastolic dysfunction (impaired relaxation). Right Ventricle: The right ventricular size is mildly enlarged. No increase in right ventricular wall thickness. Right ventricular systolic function is mildly reduced. Left Atrium: Left atrial size was mildly dilated. Right Atrium: Right atrial size was mildly dilated. Pericardium: There is no evidence of pericardial effusion. Mitral Valve: The mitral valve is normal in structure. Mild mitral valve regurgitation.  Tricuspid Valve: The tricuspid valve is normal in structure. Tricuspid valve regurgitation is trivial. Aortic Valve: The aortic valve is normal in structure. Aortic valve regurgitation is not visualized. Pulmonic Valve: The pulmonic valve was normal in structure. Pulmonic valve regurgitation is not visualized. Aorta: The ascending aorta was not well visualized. IAS/Shunts: No atrial level shunt detected by color flow Doppler.  LEFT VENTRICLE PLAX 2D LVIDd:         4.00 cm  Diastology LVIDs:         2.60 cm  LV e' medial:    7.18 cm/s LV PW:         0.90 cm  LV E/e' medial:  9.1 LV IVS:        0.90 cm  LV e' lateral:   9.90 cm/s LVOT diam:     2.20 cm  LV E/e' lateral: 6.6 LV SV:         56 LV SV Index:   32 LVOT Area:     3.80 cm  RIGHT VENTRICLE RV Basal diam:  4.70 cm RV S prime:     10.40 cm/s TAPSE (M-mode): 1.9 cm LEFT ATRIUM             Index       RIGHT ATRIUM           Index LA diam:        3.50 cm 2.00 cm/m  RA Area:     17.20 cm LA Vol (A2C):   25.1 ml 14.32 ml/m RA Volume:   58.60 ml  33.44  ml/m LA Vol (A4C):   44.9 ml 25.62 ml/m LA Biplane Vol: 33.6 ml 19.17 ml/m  AORTIC VALVE LVOT Vmax:   71.80 cm/s LVOT Vmean:  48.000 cm/s LVOT VTI:    0.147 m  AORTA Ao Root diam: 3.20 cm MITRAL VALVE               TRICUSPID VALVE MV Area (PHT): 2.83 cm    TR Peak grad:   15.7 mmHg MV Decel Time: 268 msec    TR Vmax:        198.00 cm/s MV E velocity: 65.10 cm/s MV A velocity: 84.00 cm/s  SHUNTS MV E/A ratio:  0.77        Systemic VTI:  0.15 m                            Systemic Diam: 2.20 cm Yolonda Kida MD Electronically signed by Yolonda Kida MD Signature Date/Time: 04/20/2021/10:23:08 PM    Final     Microbiology Recent Results (from the past 240 hour(s))  Resp Panel by RT-PCR (Flu A&B, Covid) Nasopharyngeal Swab     Status: None   Collection Time: 04/25/2021  2:49 PM   Specimen: Nasopharyngeal Swab; Nasopharyngeal(NP) swabs in vial transport medium  Result Value Ref Range Status   SARS  Coronavirus 2 by RT PCR NEGATIVE NEGATIVE Final    Comment: (NOTE) SARS-CoV-2 target nucleic acids are NOT DETECTED.  The SARS-CoV-2 RNA is generally detectable in upper respiratory specimens during the acute phase of infection. The lowest concentration of SARS-CoV-2 viral copies this assay can detect is 138 copies/mL. A negative result does not preclude SARS-Cov-2 infection and should not be used as the sole basis for treatment or other patient management decisions. A negative result may occur with  improper specimen collection/handling, submission of specimen other than nasopharyngeal swab, presence of viral mutation(s) within the areas targeted by this assay, and inadequate number of viral copies(<138 copies/mL). A negative result must be combined with clinical observations, patient history, and epidemiological information. The expected result is Negative.  Fact Sheet for Patients:  EntrepreneurPulse.com.au  Fact Sheet for Healthcare Providers:  IncredibleEmployment.be  This test is no t yet approved or cleared by the [place] FDA and  has been authorized for detection and/or diagnosis of SARS-CoV-2 by FDA under an Emergency Use Authorization (EUA). This EUA will remain  in effect (meaning this test can be used) for the duration of the COVID-19 declaration under Section 564(b)(1) of the Act, 21 U.S.C.section 360bbb-3(b)(1), unless the authorization is terminated  or revoked sooner.       Influenza A by PCR NEGATIVE NEGATIVE Final   Influenza B by PCR NEGATIVE NEGATIVE Final    Comment: (NOTE) The Xpert Xpress SARS-CoV-2/FLU/RSV plus assay is intended as an aid in the diagnosis of influenza from Nasopharyngeal swab specimens and should not be used as a sole basis for treatment. Nasal washings and aspirates are unacceptable for Xpert Xpress SARS-CoV-2/FLU/RSV testing.  Fact Sheet for  Patients: EntrepreneurPulse.com.au  Fact Sheet for Healthcare Providers: IncredibleEmployment.be  This test is not yet approved or cleared by the [place] FDA and has been authorized for detection and/or diagnosis of SARS-CoV-2 by FDA under an Emergency Use Authorization (EUA). This EUA will remain in effect (meaning this test can be used) for the duration of the COVID-19 declaration under Section 564(b)(1) of the Act, 21 U.S.C. section 360bbb-3(b)(1), unless the authorization is terminated or revoked.  Performed at [facility], [address] [place], [place] [address]   Urine Culture     Status: None   Collection Time: 04/19/21 10:13 AM   Specimen: Urine, Clean Catch  Result Value Ref Range Status   Specimen Description   Final    URINE, CLEAN CATCH Performed at [facility], [address], [place], [place] [address]    Special Requests   Final    NONE Performed at [facility], [address], [place], [place] [address]    Culture   Final    NO GROWTH Performed at [facility], [place] [address], [place], [place] [address]    Report Status 04/20/2021 FINAL  Final  Respiratory (~20 pathogens) panel by PCR     Status: None   Collection Time: 04/19/21 10:14 AM   Specimen: Nasopharyngeal Swab; Respiratory  Result Value Ref Range Status   Adenovirus NOT DETECTED NOT DETECTED Final   Coronavirus 229E NOT DETECTED NOT DETECTED Final    Comment: (NOTE) The Coronavirus on the Respiratory Panel, DOES NOT test for the novel  Coronavirus (2019 nCoV)    Coronavirus HKU1 NOT DETECTED NOT DETECTED Final   Coronavirus NL63 NOT DETECTED NOT DETECTED Final   Coronavirus OC43 NOT DETECTED NOT DETECTED Final   Metapneumovirus NOT DETECTED NOT DETECTED Final   Rhinovirus / Enterovirus NOT DETECTED NOT DETECTED Final   Influenza A NOT DETECTED NOT DETECTED Final   Influenza B NOT DETECTED NOT DETECTED Final    Parainfluenza Virus 1 NOT DETECTED NOT DETECTED Final   Parainfluenza Virus 2 NOT DETECTED NOT DETECTED Final   Parainfluenza Virus 3 NOT DETECTED NOT DETECTED Final   Parainfluenza Virus 4 NOT DETECTED NOT DETECTED Final   Respiratory Syncytial Virus NOT DETECTED NOT DETECTED Final   Bordetella pertussis NOT DETECTED NOT DETECTED Final   Bordetella Parapertussis NOT DETECTED NOT DETECTED Final   Chlamydophila pneumoniae NOT DETECTED NOT DETECTED Final   Mycoplasma pneumoniae NOT DETECTED NOT DETECTED Final    Comment: Performed at [facility], [place] [address] [place], [place] [address]  CULTURE, BLOOD (ROUTINE X 2) w Reflex to ID Panel     Status: None (Preliminary result)   Collection Time: 04/19/21 10:22 AM   Specimen: BLOOD  Result Value Ref Range Status   Specimen Description BLOOD BLOOD RIGHT HAND  Final   Special Requests   Final    BOTTLES DRAWN AEROBIC AND ANAEROBIC Blood Culture adequate volume   Culture   Final    NO GROWTH 3 DAYS Performed at [facility], [address], [place], [place] [address]    Report Status PENDING  Incomplete  CULTURE, BLOOD (ROUTINE X 2) w Reflex to ID Panel     Status: None (Preliminary result)   Collection Time: 04/19/21 10:34 AM   Specimen: BLOOD  Result Value Ref Range Status   Specimen Description BLOOD BLOOD RIGHT FOREARM  Final   Special Requests   Final    BOTTLES DRAWN AEROBIC AND ANAEROBIC Blood Culture adequate volume   Culture   Final    NO GROWTH 3 DAYS Performed at [facility], [address], [place], [place] [address]    Report Status PENDING  Incomplete  MRSA Next Gen by PCR, Nasal     Status: None   Collection Time: 04/19/21 11:44 AM   Specimen: Nasal Mucosa; Nasal Swab  Result Value Ref Range Status   MRSA by PCR Next Gen NOT DETECTED  NOT DETECTED Final    Comment: (NOTE) The GeneXpert MRSA Assay (FDA approved for NASAL specimens only), is one component of a comprehensive MRSA colonization  surveillance program. It is not intended to diagnose MRSA infection nor to guide or monitor treatment for MRSA infections. Test performance is not FDA approved in patients less than 69 years old. Performed at [facility], [address] [place], [place] [address]   Culture, Respiratory w Gram Stain     Status: None   Collection Time: 04/19/21 10:49 PM   Specimen: Tracheal Aspirate  Result Value Ref Range Status   Specimen Description   Final    TRACHEAL ASPIRATE Performed at [facility], [address], [place], [place] [address]    Special Requests   Final    NONE Performed at [facility], [place], [place] [address]    Gram Stain   Final    MODERATE SQUAMOUS EPITHELIAL CELLS PRESENT MODERATE WBC SEEN NO ORGANISMS SEEN    Culture   Final    RARE Consistent with normal respiratory flora. No Pseudomonas species isolated Performed at [place] [address], [place],  [address]    Report Status 18-May-2021 FINAL  Final    Lab Basic Metabolic Panel: Recent Labs  Lab 04/17/21 0429 04/18/21 0404 04/19/21 0234 04/20/21 0532 04/21/21 0723 2021/05/18 0415  NA 131* 132* 132* 133* 132*  --   K 2.9* 4.2 4.1 3.7 3.4* 3.6  CL 88* 95* 98 98 95*  --   CO2 37* '31 27 26 28  '$ --   GLUCOSE 92 81 84 134* 78  --   BUN 5* '6 8 12 17  '$ --   CREATININE 0.65 0.63 0.60 0.60 0.76 0.70  CALCIUM 7.4* 7.4* 7.4* 7.6* 7.8*  --   MG 2.2 1.9 1.9  --  1.8 2.2  PHOS 3.5 2.3* 2.8  --  3.3 3.1   Liver Function Tests: Recent Labs  Lab 04/17/21 0429 04/18/21 0404 04/20/21 0532 04/21/21 1044 May 18, 2021 0415  AST '28 28 26 '$ 53* 52*  ALT '15 17 14 15 15  '$ ALKPHOS 118 126 89 105 129*  BILITOT 0.9 0.8 0.8 1.5* 1.2  PROT 5.4* 5.6* 5.7* 6.0* 5.8*  ALBUMIN 1.7* 1.9* 2.0* 1.9* 1.7*   No results for input(s): LIPASE, AMYLASE in the last 168 hours. No results for input(s): AMMONIA in the last 168 hours. CBC: Recent Labs  Lab 04/17/21 0429  04/18/21 0404 04/19/21 1023 04/20/21 0532 May 18, 2021 0415  WBC 7.3 8.6 12.9* 15.4* 12.4*  NEUTROABS  --   --   --   --  10.1*  HGB 10.2* 10.7* 10.1* 10.0* 9.4*  HCT 28.7* 31.9* 29.0* 30.5* 28.3*  MCV 92.9 95.5 94.5 96.2 99.3  PLT 248 286 330 319 350   Cardiac Enzymes: No results for input(s): CKTOTAL, CKMB, CKMBINDEX, TROPONINI in the last 168 hours. Sepsis Labs: Recent Labs  Lab 04/18/21 0404 04/19/21 1023 04/20/21 0532 04/21/21 0723 05-18-21 0415  PROCALCITON  --  3.38 1.63 1.22  --   WBC 8.6 12.9* 15.4*  --  12.4*    Procedures/Operations  04/19/21: Endotracheal intubation 04/21/21: Right IJ CVC placed      Darel Hong, AGACNP-BC [facility] [place] epic messenger for cross cover needs If after hours, please call E-link  Bradly Bienenstock 05/18/2021, 9:41 PM
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
## 2021-05-13 DEATH — deceased

## 2021-05-23 LAB — BLOOD GAS, ARTERIAL
Acid-Base Excess: 3.2 mmol/L — ABNORMAL HIGH (ref 0.0–2.0)
Acid-Base Excess: 4.2 mmol/L — ABNORMAL HIGH (ref 0.0–2.0)
Acid-Base Excess: 6.1 mmol/L — ABNORMAL HIGH (ref 0.0–2.0)
Bicarbonate: 27.9 mmol/L (ref 20.0–28.0)
Bicarbonate: 30.5 mmol/L — ABNORMAL HIGH (ref 20.0–28.0)
Bicarbonate: 31.2 mmol/L — ABNORMAL HIGH (ref 20.0–28.0)
FIO2: 0.8
FIO2: 0.8
FIO2: 50
MECHVT: 450 mL
O2 Saturation: 94.9 %
O2 Saturation: 95.8 %
O2 Saturation: 97.3 %
PEEP: 5 cmH2O
Patient temperature: 37
Patient temperature: 37
Patient temperature: 37
RATE: 15 resp/min
pCO2 arterial: 42 mmHg (ref 32.0–48.0)
pCO2 arterial: 47 mmHg (ref 32.0–48.0)
pCO2 arterial: 54 mmHg — ABNORMAL HIGH (ref 32.0–48.0)
pH, Arterial: 7.36 (ref 7.350–7.450)
pH, Arterial: 7.43 (ref 7.350–7.450)
pH, Arterial: 7.43 (ref 7.350–7.450)
pO2, Arterial: 78 mmHg — ABNORMAL LOW (ref 83.0–108.0)
pO2, Arterial: 78 mmHg — ABNORMAL LOW (ref 83.0–108.0)
pO2, Arterial: 91 mmHg (ref 83.0–108.0)
